# Patient Record
Sex: Male | Born: 1955
Health system: Southern US, Community
[De-identification: ages and names within clinical notes are randomized; demographics above are authoritative.]

## PROBLEM LIST (undated history)

## (undated) DIAGNOSIS — R4182 Altered mental status, unspecified: Secondary | ICD-10-CM

## (undated) DIAGNOSIS — J441 Chronic obstructive pulmonary disease with (acute) exacerbation: Secondary | ICD-10-CM

## (undated) DIAGNOSIS — E876 Hypokalemia: Secondary | ICD-10-CM

## (undated) DIAGNOSIS — M1990 Unspecified osteoarthritis, unspecified site: Secondary | ICD-10-CM

## (undated) DIAGNOSIS — Z720 Tobacco use: Secondary | ICD-10-CM

## (undated) DIAGNOSIS — K219 Gastro-esophageal reflux disease without esophagitis: Secondary | ICD-10-CM

## (undated) DIAGNOSIS — I959 Hypotension, unspecified: Secondary | ICD-10-CM

## (undated) DIAGNOSIS — I469 Cardiac arrest, cause unspecified: Secondary | ICD-10-CM

## (undated) DIAGNOSIS — R7303 Prediabetes: Secondary | ICD-10-CM

## (undated) DIAGNOSIS — I2510 Atherosclerotic heart disease of native coronary artery without angina pectoris: Secondary | ICD-10-CM

## (undated) DIAGNOSIS — J42 Unspecified chronic bronchitis: Secondary | ICD-10-CM

## (undated) DIAGNOSIS — C801 Malignant (primary) neoplasm, unspecified: Secondary | ICD-10-CM

## (undated) DIAGNOSIS — E785 Hyperlipidemia, unspecified: Secondary | ICD-10-CM

## (undated) DIAGNOSIS — I214 Non-ST elevation (NSTEMI) myocardial infarction: Secondary | ICD-10-CM

## (undated) DIAGNOSIS — Z862 Personal history of diseases of the blood and blood-forming organs and certain disorders involving the immune mechanism: Secondary | ICD-10-CM

## (undated) HISTORY — PX: OTHER SURGICAL HISTORY: SHX169

## (undated) HISTORY — PX: NO PAST SURGERIES: SHX2092

---

## 2007-08-15 ENCOUNTER — Emergency Department (HOSPITAL_COMMUNITY): Admission: EM | Admit: 2007-08-15 | Discharge: 2007-08-15 | Payer: Self-pay | Admitting: Emergency Medicine

## 2007-12-18 ENCOUNTER — Emergency Department (HOSPITAL_COMMUNITY): Admission: EM | Admit: 2007-12-18 | Discharge: 2007-12-18 | Payer: Self-pay | Admitting: Emergency Medicine

## 2013-06-12 ENCOUNTER — Inpatient Hospital Stay (HOSPITAL_COMMUNITY): Payer: Medicaid Other

## 2013-06-12 ENCOUNTER — Encounter (HOSPITAL_COMMUNITY): Payer: Self-pay | Admitting: Emergency Medicine

## 2013-06-12 ENCOUNTER — Emergency Department (HOSPITAL_COMMUNITY): Payer: Medicaid Other

## 2013-06-12 ENCOUNTER — Inpatient Hospital Stay (HOSPITAL_COMMUNITY)
Admission: EM | Admit: 2013-06-12 | Discharge: 2013-06-19 | DRG: 246 | Disposition: A | Discharge status: Home / Self Care | Payer: Medicaid Other | Attending: Cardiovascular Disease | Admitting: Cardiovascular Disease

## 2013-06-12 ENCOUNTER — Encounter (HOSPITAL_COMMUNITY)
Admission: EM | Disposition: A | Discharge status: Home / Self Care | Payer: Self-pay | Source: Home / Self Care | Attending: Cardiovascular Disease

## 2013-06-12 DIAGNOSIS — I4729 Other ventricular tachycardia: Secondary | ICD-10-CM | POA: Diagnosis present

## 2013-06-12 DIAGNOSIS — I2510 Atherosclerotic heart disease of native coronary artery without angina pectoris: Secondary | ICD-10-CM | POA: Diagnosis present

## 2013-06-12 DIAGNOSIS — I472 Ventricular tachycardia, unspecified: Secondary | ICD-10-CM | POA: Diagnosis present

## 2013-06-12 DIAGNOSIS — E876 Hypokalemia: Secondary | ICD-10-CM | POA: Diagnosis present

## 2013-06-12 DIAGNOSIS — E785 Hyperlipidemia, unspecified: Secondary | ICD-10-CM | POA: Diagnosis present

## 2013-06-12 DIAGNOSIS — I214 Non-ST elevation (NSTEMI) myocardial infarction: Secondary | ICD-10-CM | POA: Diagnosis present

## 2013-06-12 DIAGNOSIS — Z23 Encounter for immunization: Secondary | ICD-10-CM | POA: Diagnosis not present

## 2013-06-12 DIAGNOSIS — Z87891 Personal history of nicotine dependence: Secondary | ICD-10-CM | POA: Diagnosis present

## 2013-06-12 DIAGNOSIS — I469 Cardiac arrest, cause unspecified: Secondary | ICD-10-CM | POA: Diagnosis present

## 2013-06-12 DIAGNOSIS — I2582 Chronic total occlusion of coronary artery: Secondary | ICD-10-CM | POA: Diagnosis present

## 2013-06-12 DIAGNOSIS — T380X5A Adverse effect of glucocorticoids and synthetic analogues, initial encounter: Secondary | ICD-10-CM | POA: Diagnosis present

## 2013-06-12 DIAGNOSIS — I4901 Ventricular fibrillation: Secondary | ICD-10-CM | POA: Diagnosis present

## 2013-06-12 DIAGNOSIS — G931 Anoxic brain damage, not elsewhere classified: Secondary | ICD-10-CM

## 2013-06-12 DIAGNOSIS — J9600 Acute respiratory failure, unspecified whether with hypoxia or hypercapnia: Secondary | ICD-10-CM | POA: Diagnosis present

## 2013-06-12 DIAGNOSIS — I498 Other specified cardiac arrhythmias: Secondary | ICD-10-CM | POA: Diagnosis present

## 2013-06-12 DIAGNOSIS — Z8674 Personal history of sudden cardiac arrest: Secondary | ICD-10-CM

## 2013-06-12 DIAGNOSIS — J441 Chronic obstructive pulmonary disease with (acute) exacerbation: Secondary | ICD-10-CM | POA: Diagnosis present

## 2013-06-12 DIAGNOSIS — Z9861 Coronary angioplasty status: Secondary | ICD-10-CM

## 2013-06-12 DIAGNOSIS — I959 Hypotension, unspecified: Secondary | ICD-10-CM | POA: Diagnosis not present

## 2013-06-12 DIAGNOSIS — R000 Tachycardia, unspecified: Secondary | ICD-10-CM

## 2013-06-12 DIAGNOSIS — J9601 Acute respiratory failure with hypoxia: Secondary | ICD-10-CM

## 2013-06-12 DIAGNOSIS — R4182 Altered mental status, unspecified: Secondary | ICD-10-CM | POA: Diagnosis present

## 2013-06-12 DIAGNOSIS — Z720 Tobacco use: Secondary | ICD-10-CM | POA: Diagnosis present

## 2013-06-12 DIAGNOSIS — J42 Unspecified chronic bronchitis: Secondary | ICD-10-CM | POA: Diagnosis present

## 2013-06-12 DIAGNOSIS — J449 Chronic obstructive pulmonary disease, unspecified: Secondary | ICD-10-CM | POA: Diagnosis present

## 2013-06-12 HISTORY — DX: Chronic obstructive pulmonary disease with (acute) exacerbation: J44.1

## 2013-06-12 HISTORY — DX: Hypotension, unspecified: I95.9

## 2013-06-12 HISTORY — PX: LEFT HEART CATHETERIZATION WITH CORONARY ANGIOGRAM: SHX5451

## 2013-06-12 HISTORY — DX: Cardiac arrest, cause unspecified: I46.9

## 2013-06-12 HISTORY — DX: Unspecified chronic bronchitis: J42

## 2013-06-12 HISTORY — DX: Tobacco use: Z72.0

## 2013-06-12 HISTORY — PX: CORONARY ANGIOPLASTY WITH STENT PLACEMENT: SHX49

## 2013-06-12 HISTORY — DX: Hypomagnesemia: E83.42

## 2013-06-12 HISTORY — DX: Altered mental status, unspecified: R41.82

## 2013-06-12 HISTORY — DX: Hyperlipidemia, unspecified: E78.5

## 2013-06-12 HISTORY — DX: Non-ST elevation (NSTEMI) myocardial infarction: I21.4

## 2013-06-12 HISTORY — DX: Hypokalemia: E87.6

## 2013-06-12 LAB — TROPONIN I
TROPONIN I: 5.76 ng/mL — AB (ref <0.30)
Troponin I: 4.42 ng/mL (ref <0.30)

## 2013-06-12 LAB — COMPREHENSIVE METABOLIC PANEL
ALK PHOS: 90 U/L (ref 39–117)
ALT: 38 U/L (ref 0–53)
ALT: 34 U/L (ref 0–53)
AST: 59 U/L — ABNORMAL HIGH (ref 0–37)
AST: 53 U/L — ABNORMAL HIGH (ref 0–37)
Albumin: 3.2 g/dL — ABNORMAL LOW (ref 3.5–5.2)
Albumin: 3.2 g/dL — ABNORMAL LOW (ref 3.5–5.2)
Alkaline Phosphatase: 89 U/L (ref 39–117)
BILIRUBIN TOTAL: 0.4 mg/dL (ref 0.3–1.2)
BUN: 16 mg/dL (ref 6–23)
BUN: 14 mg/dL (ref 6–23)
CHLORIDE: 103 meq/L (ref 96–112)
CO2: 18 meq/L — AB (ref 19–32)
CO2: 20 meq/L (ref 19–32)
CREATININE: 0.94 mg/dL (ref 0.50–1.35)
Calcium: 8.3 mg/dL — ABNORMAL LOW (ref 8.4–10.5)
Calcium: 7.5 mg/dL — ABNORMAL LOW (ref 8.4–10.5)
Chloride: 103 mEq/L (ref 96–112)
Creatinine, Ser: 0.71 mg/dL (ref 0.50–1.35)
GFR calc Af Amer: >90 mL/min (ref >90)
GFR calc non Af Amer: >90 mL/min (ref >90)
GLUCOSE: 190 mg/dL — AB (ref 70–99)
Glucose, Bld: 241 mg/dL — ABNORMAL HIGH (ref 70–99)
POTASSIUM: 4.3 meq/L (ref 3.7–5.3)
Potassium: 4.4 mEq/L (ref 3.7–5.3)
SODIUM: 138 meq/L (ref 137–147)
Sodium: 136 mEq/L — ABNORMAL LOW (ref 137–147)
TOTAL PROTEIN: 6.8 g/dL (ref 6.0–8.3)
Total Bilirubin: 0.3 mg/dL (ref 0.3–1.2)
Total Protein: 6.6 g/dL (ref 6.0–8.3)

## 2013-06-12 LAB — CBC
HEMATOCRIT: 43.4 % (ref 39.0–52.0)
HEMOGLOBIN: 15 g/dL (ref 13.0–17.0)
MCH: 31.2 pg (ref 26.0–34.0)
MCHC: 34.6 g/dL (ref 30.0–36.0)
MCV: 90.2 fL (ref 78.0–100.0)
Platelets: 289 10*3/uL (ref 150–400)
RBC: 4.81 MIL/uL (ref 4.22–5.81)
RDW: 13.2 % (ref 11.5–15.5)
WBC: 19.6 10*3/uL — ABNORMAL HIGH (ref 4.0–10.5)

## 2013-06-12 LAB — POCT I-STAT 3, ART BLOOD GAS (G3+)
ACID-BASE DEFICIT: 3 mmol/L — AB (ref 0.0–2.0)
Bicarbonate: 24.7 mEq/L — ABNORMAL HIGH (ref 20.0–24.0)
O2 SAT: 100 %
PCO2 ART: 51.3 mmHg — AB (ref 35.0–45.0)
PO2 ART: 259 mmHg — AB (ref 80.0–100.0)
Patient temperature: 98.6
TCO2: 26 mmol/L (ref 0–100)
pH, Arterial: 7.291 — ABNORMAL LOW (ref 7.350–7.450)

## 2013-06-12 LAB — CBC WITH DIFFERENTIAL/PLATELET
BASOS ABS: 0 10*3/uL (ref 0.0–0.1)
Basophils Relative: 0 % (ref 0–1)
EOS ABS: 0 10*3/uL (ref 0.0–0.7)
Eosinophils Relative: 0 % (ref 0–5)
HCT: 42.3 % (ref 39.0–52.0)
Hemoglobin: 14.5 g/dL (ref 13.0–17.0)
LYMPHS ABS: 1.3 10*3/uL (ref 0.7–4.0)
LYMPHS PCT: 7 % — AB (ref 12–46)
MCH: 30.7 pg (ref 26.0–34.0)
MCHC: 34.3 g/dL (ref 30.0–36.0)
MCV: 89.6 fL (ref 78.0–100.0)
MONOS PCT: 15 % — AB (ref 3–12)
Monocytes Absolute: 2.7 10*3/uL — ABNORMAL HIGH (ref 0.1–1.0)
Neutro Abs: 14 10*3/uL — ABNORMAL HIGH (ref 1.7–7.7)
Neutrophils Relative %: 78 % — ABNORMAL HIGH (ref 43–77)
PLATELETS: 267 10*3/uL (ref 150–400)
RBC: 4.72 MIL/uL (ref 4.22–5.81)
RDW: 13 % (ref 11.5–15.5)
WBC: 18 10*3/uL — ABNORMAL HIGH (ref 4.0–10.5)

## 2013-06-12 LAB — POCT I-STAT, CHEM 8
BUN: 22 mg/dL (ref 6–23)
BUN: 13 mg/dL (ref 6–23)
BUN: 10 mg/dL (ref 6–23)
CALCIUM ION: 1.08 mmol/L — AB (ref 1.12–1.23)
CALCIUM ION: 1.08 mmol/L — AB (ref 1.12–1.23)
CHLORIDE: 104 meq/L (ref 96–112)
CHLORIDE: 105 meq/L (ref 96–112)
CREATININE: 0.6 mg/dL (ref 0.50–1.35)
CREATININE: 0.5 mg/dL (ref 0.50–1.35)
Calcium, Ion: 1.1 mmol/L — ABNORMAL LOW (ref 1.12–1.23)
Chloride: 102 mEq/L (ref 96–112)
Creatinine, Ser: 1.1 mg/dL (ref 0.50–1.35)
GLUCOSE: 232 mg/dL — AB (ref 70–99)
GLUCOSE: 182 mg/dL — AB (ref 70–99)
Glucose, Bld: 190 mg/dL — ABNORMAL HIGH (ref 70–99)
HCT: 45 % (ref 39.0–52.0)
HEMATOCRIT: 47 % (ref 39.0–52.0)
HEMATOCRIT: 46 % (ref 39.0–52.0)
Hemoglobin: 16 g/dL (ref 13.0–17.0)
Hemoglobin: 15.6 g/dL (ref 13.0–17.0)
Hemoglobin: 15.3 g/dL (ref 13.0–17.0)
POTASSIUM: 5.1 meq/L (ref 3.7–5.3)
Potassium: 3.5 mEq/L — ABNORMAL LOW (ref 3.7–5.3)
Potassium: 3.1 mEq/L — ABNORMAL LOW (ref 3.7–5.3)
Sodium: 138 mEq/L (ref 137–147)
Sodium: 141 mEq/L (ref 137–147)
Sodium: 142 mEq/L (ref 137–147)
TCO2: 24 mmol/L (ref 0–100)
TCO2: 19 mmol/L (ref 0–100)
TCO2: 19 mmol/L (ref 0–100)

## 2013-06-12 LAB — APTT
APTT: 93 s — AB (ref 24–37)
APTT: 43 s — AB (ref 24–37)

## 2013-06-12 LAB — PHOSPHORUS: PHOSPHORUS: 3.6 mg/dL (ref 2.3–4.6)

## 2013-06-12 LAB — BASIC METABOLIC PANEL
BUN: 11 mg/dL (ref 6–23)
BUN: 9 mg/dL (ref 6–23)
BUN: 9 mg/dL (ref 6–23)
CHLORIDE: 105 meq/L (ref 96–112)
CHLORIDE: 103 meq/L (ref 96–112)
CO2: 21 mEq/L (ref 19–32)
CO2: 22 mEq/L (ref 19–32)
CO2: 22 meq/L (ref 19–32)
Calcium: 7.7 mg/dL — ABNORMAL LOW (ref 8.4–10.5)
Calcium: 7.3 mg/dL — ABNORMAL LOW (ref 8.4–10.5)
Calcium: 8 mg/dL — ABNORMAL LOW (ref 8.4–10.5)
Chloride: 103 mEq/L (ref 96–112)
Creatinine, Ser: 0.52 mg/dL (ref 0.50–1.35)
Creatinine, Ser: 0.48 mg/dL — ABNORMAL LOW (ref 0.50–1.35)
Creatinine, Ser: 0.48 mg/dL — ABNORMAL LOW (ref 0.50–1.35)
GFR calc Af Amer: >90 mL/min (ref >90)
GFR calc Af Amer: >90 mL/min (ref >90)
GFR calc non Af Amer: >90 mL/min (ref >90)
GFR calc non Af Amer: >90 mL/min (ref >90)
GLUCOSE: 212 mg/dL — AB (ref 70–99)
Glucose, Bld: 102 mg/dL — ABNORMAL HIGH (ref 70–99)
Glucose, Bld: 136 mg/dL — ABNORMAL HIGH (ref 70–99)
POTASSIUM: 3.9 meq/L (ref 3.7–5.3)
POTASSIUM: 3.7 meq/L (ref 3.7–5.3)
POTASSIUM: 3.7 meq/L (ref 3.7–5.3)
SODIUM: 137 meq/L (ref 137–147)
SODIUM: 138 meq/L (ref 137–147)
Sodium: 139 mEq/L (ref 137–147)

## 2013-06-12 LAB — PROTIME-INR
INR: 1.84 — ABNORMAL HIGH (ref 0.00–1.49)
INR: 1.32 (ref 0.00–1.49)
PROTHROMBIN TIME: 20.7 s — AB (ref 11.6–15.2)
Prothrombin Time: 16.1 seconds — ABNORMAL HIGH (ref 11.6–15.2)

## 2013-06-12 LAB — GLUCOSE, CAPILLARY
GLUCOSE-CAPILLARY: 189 mg/dL — AB (ref 70–99)
GLUCOSE-CAPILLARY: 204 mg/dL — AB (ref 70–99)
GLUCOSE-CAPILLARY: 198 mg/dL — AB (ref 70–99)
GLUCOSE-CAPILLARY: 99 mg/dL (ref 70–99)
GLUCOSE-CAPILLARY: 102 mg/dL — AB (ref 70–99)
Glucose-Capillary: 207 mg/dL — ABNORMAL HIGH (ref 70–99)
Glucose-Capillary: 209 mg/dL — ABNORMAL HIGH (ref 70–99)
Glucose-Capillary: 185 mg/dL — ABNORMAL HIGH (ref 70–99)
Glucose-Capillary: 193 mg/dL — ABNORMAL HIGH (ref 70–99)
Glucose-Capillary: 131 mg/dL — ABNORMAL HIGH (ref 70–99)
Glucose-Capillary: 97 mg/dL (ref 70–99)
Glucose-Capillary: 116 mg/dL — ABNORMAL HIGH (ref 70–99)
Glucose-Capillary: 47 mg/dL — ABNORMAL LOW (ref 70–99)
Glucose-Capillary: 109 mg/dL — ABNORMAL HIGH (ref 70–99)

## 2013-06-12 LAB — BLOOD GAS, ARTERIAL
Acid-base deficit: 4.6 mmol/L — ABNORMAL HIGH (ref 0.0–2.0)
Bicarbonate: 20.4 mEq/L (ref 20.0–24.0)
Drawn by: 28701
FIO2: 0.6 %
O2 Saturation: 99.4 %
PEEP/CPAP: 8 cmH2O
PH ART: 7.319 — AB (ref 7.350–7.450)
PO2 ART: 249 mmHg — AB (ref 80.0–100.0)
Patient temperature: 98.6
RATE: 20 resp/min
TCO2: 21.7 mmol/L (ref 0–100)
pCO2 arterial: 41 mmHg (ref 35.0–45.0)

## 2013-06-12 LAB — URINE MICROSCOPIC-ADD ON

## 2013-06-12 LAB — URINALYSIS, ROUTINE W REFLEX MICROSCOPIC
Bilirubin Urine: NEGATIVE
Glucose, UA: 500 mg/dL — AB
KETONES UR: 15 mg/dL — AB
NITRITE: NEGATIVE
Protein, ur: NEGATIVE mg/dL
SPECIFIC GRAVITY, URINE: 1.03 (ref 1.005–1.030)
Urobilinogen, UA: 0.2 mg/dL (ref 0.0–1.0)
pH: 7 (ref 5.0–8.0)

## 2013-06-12 LAB — RAPID URINE DRUG SCREEN, HOSP PERFORMED
Amphetamines: NOT DETECTED
Barbiturates: NOT DETECTED
Benzodiazepines: POSITIVE — AB
Cocaine: NOT DETECTED
Opiates: NOT DETECTED
Tetrahydrocannabinol: POSITIVE — AB

## 2013-06-12 LAB — POCT ACTIVATED CLOTTING TIME: Activated Clotting Time: 520 seconds

## 2013-06-12 LAB — HEMOGLOBIN A1C
HEMOGLOBIN A1C: 5.9 % — AB (ref <5.7)
Mean Plasma Glucose: 123 mg/dL — ABNORMAL HIGH (ref <117)

## 2013-06-12 LAB — LIPID PANEL
CHOL/HDL RATIO: 3.7 ratio
Cholesterol: 132 mg/dL (ref 0–200)
HDL: 36 mg/dL — AB (ref >39)
LDL CALC: 77 mg/dL (ref 0–99)
Triglycerides: 94 mg/dL (ref <150)
VLDL: 19 mg/dL (ref 0–40)

## 2013-06-12 LAB — MAGNESIUM
Magnesium: 2.2 mg/dL (ref 1.5–2.5)
Magnesium: 1.8 mg/dL (ref 1.5–2.5)
Magnesium: 2 mg/dL (ref 1.5–2.5)
Magnesium: 1.7 mg/dL (ref 1.5–2.5)

## 2013-06-12 LAB — CG4 I-STAT (LACTIC ACID): LACTIC ACID, VENOUS: 3.35 mmol/L — AB (ref 0.5–2.2)

## 2013-06-12 LAB — POCT I-STAT TROPONIN I: Troponin i, poc: 0.32 ng/mL (ref 0.00–0.08)

## 2013-06-12 SURGERY — LEFT HEART CATHETERIZATION WITH CORONARY ANGIOGRAM
Anesthesia: LOCAL

## 2013-06-12 MED ORDER — SODIUM CHLORIDE 0.9 % IV SOLN
INTRAVENOUS | Status: DC
  Administered 2013-06-12 – 2013-06-14 (×2): via INTRAVENOUS

## 2013-06-12 MED ORDER — SODIUM CHLORIDE 0.9 % IV SOLN
INTRAVENOUS | Status: DC
  Administered 2013-06-12: 1000 mL via INTRAVENOUS

## 2013-06-12 MED ORDER — SODIUM CHLORIDE 0.9 % IV SOLN
1.0000 ug/kg/min | INTRAVENOUS | Status: DC
  Administered 2013-06-12: 1 ug/kg/min via INTRAVENOUS
  Filled 2013-06-12 (×2): qty 20

## 2013-06-12 MED ORDER — SUCCINYLCHOLINE CHLORIDE 20 MG/ML IJ SOLN
120.0000 mg | Freq: Once | INTRAMUSCULAR | Status: AC
  Administered 2013-06-12: 120 mg via INTRAVENOUS

## 2013-06-12 MED ORDER — ENOXAPARIN SODIUM 40 MG/0.4ML ~~LOC~~ SOLN
40.0000 mg | SUBCUTANEOUS | Status: DC
  Administered 2013-06-12 – 2013-06-18 (×7): 40 mg via SUBCUTANEOUS
  Filled 2013-06-12 (×8): qty 0.4

## 2013-06-12 MED ORDER — ETOMIDATE 2 MG/ML IV SOLN
20.0000 mg | Freq: Once | INTRAVENOUS | Status: AC
  Administered 2013-06-12: 20 mg via INTRAVENOUS

## 2013-06-12 MED ORDER — CISATRACURIUM BOLUS VIA INFUSION
0.1000 mg/kg | Freq: Once | INTRAVENOUS | Status: AC
  Administered 2013-06-12: 7.7 mg via INTRAVENOUS
  Filled 2013-06-12: qty 8

## 2013-06-12 MED ORDER — BIVALIRUDIN 250 MG IV SOLR
INTRAVENOUS | Status: AC
  Filled 2013-06-12: qty 250

## 2013-06-12 MED ORDER — TICAGRELOR 90 MG PO TABS
ORAL_TABLET | ORAL | Status: AC
  Filled 2013-06-12: qty 2

## 2013-06-12 MED ORDER — SODIUM CHLORIDE 0.9 % IV SOLN
1.0000 mg/h | INTRAVENOUS | Status: DC
  Administered 2013-06-12 – 2013-06-13 (×3): 2 mg/h via INTRAVENOUS
  Administered 2013-06-13: 5 mg/h via INTRAVENOUS
  Filled 2013-06-12 (×5): qty 10

## 2013-06-12 MED ORDER — NOREPINEPHRINE BITARTRATE 1 MG/ML IJ SOLN
0.5000 ug/min | INTRAVENOUS | Status: DC
  Administered 2013-06-13: 3 ug/min via INTRAVENOUS
  Administered 2013-06-13: 5 ug/min via INTRAVENOUS
  Filled 2013-06-12 (×4): qty 4

## 2013-06-12 MED ORDER — ASPIRIN 300 MG RE SUPP
300.0000 mg | RECTAL | Status: AC
  Filled 2013-06-12: qty 1

## 2013-06-12 MED ORDER — NITROGLYCERIN 0.2 MG/ML ON CALL CATH LAB
INTRAVENOUS | Status: AC
  Filled 2013-06-12: qty 1

## 2013-06-12 MED ORDER — INFLUENZA VAC SPLIT QUAD 0.5 ML IM SUSP
0.5000 mL | INTRAMUSCULAR | Status: DC
  Filled 2013-06-12: qty 0.5

## 2013-06-12 MED ORDER — PNEUMOCOCCAL VAC POLYVALENT 25 MCG/0.5ML IJ INJ
0.5000 mL | INJECTION | INTRAMUSCULAR | Status: DC
  Filled 2013-06-12: qty 0.5

## 2013-06-12 MED ORDER — METOPROLOL TARTRATE 25 MG/10 ML ORAL SUSPENSION
25.0000 mg | Freq: Two times a day (BID) | ORAL | Status: DC
  Administered 2013-06-13: 25 mg via ORAL
  Filled 2013-06-12 (×4): qty 10

## 2013-06-12 MED ORDER — BUDESONIDE 0.25 MG/2ML IN SUSP
0.2500 mg | Freq: Two times a day (BID) | RESPIRATORY_TRACT | Status: DC
  Administered 2013-06-12 – 2013-06-15 (×7): 0.25 mg via RESPIRATORY_TRACT
  Filled 2013-06-12 (×9): qty 2

## 2013-06-12 MED ORDER — POTASSIUM CHLORIDE 10 MEQ/50ML IV SOLN
INTRAVENOUS | Status: AC
  Administered 2013-06-12: 10 meq via INTRAVENOUS
  Filled 2013-06-12: qty 200

## 2013-06-12 MED ORDER — POTASSIUM CHLORIDE 10 MEQ/50ML IV SOLN
10.0000 meq | INTRAVENOUS | Status: AC
  Administered 2013-06-12 (×4): 10 meq via INTRAVENOUS

## 2013-06-12 MED ORDER — DOXYCYCLINE HYCLATE 100 MG IV SOLR
100.0000 mg | Freq: Two times a day (BID) | INTRAVENOUS | Status: DC
  Administered 2013-06-12 – 2013-06-14 (×6): 100 mg via INTRAVENOUS
  Filled 2013-06-12 (×8): qty 100

## 2013-06-12 MED ORDER — NITROGLYCERIN IN D5W 200-5 MCG/ML-% IV SOLN: 2.0000 ug/min | INTRAVENOUS | Status: DC

## 2013-06-12 MED ORDER — ONDANSETRON HCL 4 MG/2ML IJ SOLN
4.0000 mg | Freq: Four times a day (QID) | INTRAMUSCULAR | Status: DC | PRN
  Administered 2013-06-14 (×2): 4 mg via INTRAVENOUS
  Filled 2013-06-12 (×2): qty 2

## 2013-06-12 MED ORDER — ATORVASTATIN CALCIUM 80 MG PO TABS
80.0000 mg | ORAL_TABLET | Freq: Every day | ORAL | Status: DC
  Administered 2013-06-12 – 2013-06-19 (×8): 80 mg via ORAL
  Filled 2013-06-12 (×8): qty 1

## 2013-06-12 MED ORDER — CHLORHEXIDINE GLUCONATE 0.12 % MT SOLN
15.0000 mL | Freq: Two times a day (BID) | OROMUCOSAL | Status: DC
  Administered 2013-06-12 – 2013-06-14 (×5): 15 mL via OROMUCOSAL
  Filled 2013-06-12 (×5): qty 15

## 2013-06-12 MED ORDER — DEXMEDETOMIDINE HCL IN NACL 200 MCG/50ML IV SOLN
0.4000 ug/kg/h | INTRAVENOUS | Status: DC
  Filled 2013-06-12: qty 50

## 2013-06-12 MED ORDER — PANTOPRAZOLE SODIUM 40 MG IV SOLR
40.0000 mg | Freq: Every day | INTRAVENOUS | Status: DC
  Administered 2013-06-13 – 2013-06-14 (×2): 40 mg via INTRAVENOUS
  Filled 2013-06-12 (×3): qty 40

## 2013-06-12 MED ORDER — SODIUM CHLORIDE 0.9 % IV SOLN
20.0000 ug/h | INTRAVENOUS | Status: DC
  Administered 2013-06-12: 20 ug/h via INTRAVENOUS
  Administered 2013-06-13: 100 ug/h via INTRAVENOUS
  Administered 2013-06-13: 20 ug/h via INTRAVENOUS
  Filled 2013-06-12 (×4): qty 50

## 2013-06-12 MED ORDER — LIDOCAINE HCL (PF) 1 % IJ SOLN
INTRAMUSCULAR | Status: AC
  Filled 2013-06-12: qty 30

## 2013-06-12 MED ORDER — ASPIRIN EC 81 MG PO TBEC
81.0000 mg | DELAYED_RELEASE_TABLET | Freq: Every day | ORAL | Status: DC
  Filled 2013-06-12: qty 1

## 2013-06-12 MED ORDER — IPRATROPIUM-ALBUTEROL 0.5-2.5 (3) MG/3ML IN SOLN
3.0000 mL | RESPIRATORY_TRACT | Status: DC
  Administered 2013-06-12 – 2013-06-14 (×14): 3 mL via RESPIRATORY_TRACT
  Filled 2013-06-12 (×12): qty 3

## 2013-06-12 MED ORDER — INSULIN ASPART 100 UNIT/ML ~~LOC~~ SOLN
2.0000 [IU] | SUBCUTANEOUS | Status: DC
  Administered 2013-06-12: 4 [IU] via SUBCUTANEOUS
  Administered 2013-06-12: 2 [IU] via SUBCUTANEOUS
  Administered 2013-06-13: 4 [IU] via SUBCUTANEOUS
  Administered 2013-06-13 (×2): 2 [IU] via SUBCUTANEOUS
  Administered 2013-06-14: 4 [IU] via SUBCUTANEOUS

## 2013-06-12 MED ORDER — ALBUTEROL SULFATE (2.5 MG/3ML) 0.083% IN NEBU: 2.5000 mg | INHALATION_SOLUTION | RESPIRATORY_TRACT | Status: DC | PRN

## 2013-06-12 MED ORDER — SODIUM CHLORIDE 0.9 % IV SOLN
INTRAVENOUS | Status: DC
  Administered 2013-06-12 – 2013-06-14 (×2): via INTRAVENOUS

## 2013-06-12 MED ORDER — SODIUM CHLORIDE 0.9 % IV SOLN: 250.0000 mL | INTRAVENOUS | Status: DC | PRN

## 2013-06-12 MED ORDER — BIOTENE DRY MOUTH MT LIQD
15.0000 mL | Freq: Four times a day (QID) | OROMUCOSAL | Status: DC
  Administered 2013-06-12 – 2013-06-14 (×8): 15 mL via OROMUCOSAL

## 2013-06-12 MED ORDER — METOPROLOL TARTRATE 1 MG/ML IV SOLN
INTRAVENOUS | Status: AC
  Filled 2013-06-12: qty 5

## 2013-06-12 MED ORDER — TICAGRELOR 90 MG PO TABS
90.0000 mg | ORAL_TABLET | Freq: Two times a day (BID) | ORAL | Status: DC
  Administered 2013-06-12 – 2013-06-19 (×14): 90 mg via ORAL
  Filled 2013-06-12 (×15): qty 1

## 2013-06-12 MED ORDER — ACETAMINOPHEN 325 MG PO TABS
650.0000 mg | ORAL_TABLET | ORAL | Status: DC | PRN
  Administered 2013-06-15 (×3): 650 mg via ORAL
  Filled 2013-06-12 (×3): qty 2

## 2013-06-12 MED ORDER — ARTIFICIAL TEARS OP OINT
1.0000 "application " | TOPICAL_OINTMENT | Freq: Three times a day (TID) | OPHTHALMIC | Status: DC
  Administered 2013-06-12 – 2013-06-13 (×4): 1 via OPHTHALMIC
  Filled 2013-06-12: qty 3.5

## 2013-06-12 MED ORDER — ASPIRIN 81 MG PO CHEW
81.0000 mg | CHEWABLE_TABLET | Freq: Every day | ORAL | Status: DC
  Administered 2013-06-12 – 2013-06-19 (×5): 81 mg via ORAL
  Filled 2013-06-12 (×7): qty 1

## 2013-06-12 MED ORDER — ASPIRIN 325 MG PO TABS
ORAL_TABLET | ORAL | Status: AC
  Filled 2013-06-12: qty 1

## 2013-06-12 MED ORDER — SODIUM CHLORIDE 0.9 % IV SOLN
2000.0000 mL | Freq: Once | INTRAVENOUS | Status: AC
  Administered 2013-06-12: 2000 mL via INTRAVENOUS

## 2013-06-12 MED ORDER — SODIUM CHLORIDE 0.9 % IV SOLN
INTRAVENOUS | Status: DC
  Administered 2013-06-12: 1.3 [IU]/h via INTRAVENOUS
  Filled 2013-06-12: qty 1

## 2013-06-12 MED ORDER — HEPARIN (PORCINE) IN NACL 2-0.9 UNIT/ML-% IJ SOLN
INTRAMUSCULAR | Status: AC
  Filled 2013-06-12: qty 1000

## 2013-06-12 MED ORDER — METHYLPREDNISOLONE SODIUM SUCC 125 MG IJ SOLR: 125.0000 mg | Freq: Once | INTRAMUSCULAR | Status: DC

## 2013-06-12 MED ORDER — TICAGRELOR 90 MG PO TABS: 90.0000 mg | ORAL_TABLET | Freq: Two times a day (BID) | ORAL | Status: DC

## 2013-06-12 MED ORDER — CISATRACURIUM BOLUS VIA INFUSION
0.0500 mg/kg | INTRAVENOUS | Status: DC | PRN
  Filled 2013-06-12: qty 4

## 2013-06-12 MED ORDER — METHYLPREDNISOLONE SODIUM SUCC 40 MG IJ SOLR
40.0000 mg | Freq: Two times a day (BID) | INTRAMUSCULAR | Status: DC
  Administered 2013-06-12 – 2013-06-14 (×5): 40 mg via INTRAVENOUS
  Filled 2013-06-12 (×7): qty 1

## 2013-06-12 MED ORDER — ONDANSETRON HCL 4 MG/2ML IJ SOLN
4.0000 mg | Freq: Once | INTRAMUSCULAR | Status: AC
  Administered 2013-06-12: 4 mg via INTRAVENOUS

## 2013-06-12 MED ORDER — NITROGLYCERIN IN D5W 200-5 MCG/ML-% IV SOLN
INTRAVENOUS | Status: AC
  Filled 2013-06-12: qty 250

## 2013-06-12 MED ORDER — ASPIRIN 81 MG PO CHEW: 81.0000 mg | CHEWABLE_TABLET | Freq: Every day | ORAL | Status: DC

## 2013-06-12 MED ORDER — METHYLPREDNISOLONE SODIUM SUCC 40 MG IJ SOLR
40.0000 mg | Freq: Three times a day (TID) | INTRAMUSCULAR | Status: DC
  Filled 2013-06-12 (×3): qty 1

## 2013-06-12 MED ORDER — ASPIRIN 81 MG PO CHEW
CHEWABLE_TABLET | ORAL | Status: AC
  Administered 2013-06-12: 81 mg via ORAL
  Filled 2013-06-12: qty 4

## 2013-06-12 MED ORDER — INSULIN ASPART 100 UNIT/ML ~~LOC~~ SOLN: 0.0000 [IU] | Freq: Three times a day (TID) | SUBCUTANEOUS | Status: DC

## 2013-06-12 MED ORDER — SODIUM CHLORIDE 0.9 % IV SOLN: 0.2500 mg/kg/h | INTRAVENOUS | Status: DC

## 2013-06-12 NOTE — ED Notes (Addendum)
Per EMS: pt coming from home with cardiac arrest. Family found pt unresponsive in bed. Pt went to bed one hour before being found unresponsive. Upon EMS arrival pt was pulseless and apenic. King airway placed. Pt found in V-fib, shocked at 200, pulses back in sinus tach. EMS started 150 mg amioderone drip and cold saline en route, pt was given 5 mg of versed. Upon arrival to ED pt started vomiting brown emesis. CPR started at [place], pulses back at 0224

## 2013-06-12 NOTE — ED Notes (Signed)
Pt transported to cath lab.  

## 2013-06-12 NOTE — Progress Notes (Signed)
Chaplain visited with pt's friend while pt was in cath lab.  Chaplain offered support through his presence, empathic listening and by escorting pt's friend to ED bay area where ED physician spoke with her.   06/12/13 0900  Clinical Encounter Type  Visited With Family  Visit Type Spiritual support  Spiritual Encounters  Spiritual Needs Emotional    Estelle June, chaplain pager [phone]

## 2013-06-12 NOTE — Progress Notes (Signed)
A central line has been established. Therefore, the IO was removed per hospital protocol.  No bleeding to site gauze dressing in place. Timothy [name]

## 2013-06-12 NOTE — ED Provider Notes (Signed)
CSN: [id number]     Arrival date & time 06/12/13  0304 History   First MD Initiated Contact with Patient 06/12/13 [phone]     Chief Complaint  Patient presents with  . Cardiac Arrest   (Consider location/radiation/quality/duration/timing/severity/associated sxs/prior Treatment) HPI HX per EMS - called out for unresponsive at home, family reported last seen about an hour prior, did not have any complaints. EMS reports pulseless unresponsive on arrival, received 2 min CPR and shocked x 2 VF.   King airway placed, was given amiodarone 150mg  and versed for sedation. BP 140/80, sinus tach, no STEMI called. On arrival to the ER, maintaining pulses and pressure, unresponsive.   Level 5 caveat applies  No past medical history on file. No past surgical history on file. No family history on file. History  Substance Use Topics  . Smoking status: Not on file  . Smokeless tobacco: Not on file  . Alcohol Use: Not on file    Review of Systems  Unable to perform ROS level 5 caveat applies  Allergies  Review of patient's allergies indicates not on file.  Home Medications  No current outpatient prescriptions on file. There were no vitals taken for this visit. Physical Exam  Constitutional:  unresponsive  HENT:  King airway in place, vomitus in airway  Eyes:  Pupils 21mm equal sluggish  Neck: No tracheal deviation present.  Cardiovascular: Intact distal pulses.   Tachycardic   Pulmonary/Chest:  Coarse bilat breath sounds, slow spontaneous respirations, assisting bagging  Abdominal: Soft. He exhibits no distension.  Musculoskeletal:  Equal peripheral pulses, no spontaneous movement  Neurological:  Unresponsive, no gag, no spontaneous movements  Skin: Skin is warm and dry.    ED Course  INTUBATION Date/Time: 06/12/2013 3:24 AM Performed by: Teressa Lower Authorized by: Teressa Lower Consent: The procedure was performed in an emergent situation. Patient identity confirmed: arm band Time  out: Immediately prior to procedure a "time out" was called to verify the correct patient, procedure, equipment, support staff and site/side marked as required. Indications: respiratory failure and airway protection Intubation method: video-assisted Patient status: paralyzed (RSI) Sedatives: etomidate Paralytic: succinylcholine Laryngoscope size: Mac 4 Tube size: 7.5 mm Tube type: cuffed Number of attempts: 1 Cricoid pressure: yes Cords visualized: yes Post-procedure assessment: chest rise and CO2 detector Breath sounds: equal and absent over the epigastrium Cuff inflated: yes ETT to teeth: 23 cm Tube secured with: ETT holder Patient tolerance: Patient tolerated the procedure well with no immediate complications.   (including critical care time) Labs Review Labs Reviewed  CBC - Abnormal; Notable for the following:    WBC 19.6 (*)    All other components within normal limits  GLUCOSE, CAPILLARY - Abnormal; Notable for the following:    Glucose-Capillary 207 (*)    All other components within normal limits  PROTIME-INR - Abnormal; Notable for the following:    Prothrombin Time 20.7 (*)    INR 1.84 (*)    All other components within normal limits  APTT - Abnormal; Notable for the following:    aPTT 93 (*)    All other components within normal limits  COMPREHENSIVE METABOLIC PANEL - Abnormal; Notable for the following:    CO2 18 (*)    Glucose, Bld 190 (*)    Calcium 8.3 (*)    Albumin 3.2 (*)    AST 59 (*)    All other components within normal limits  BLOOD GAS, ARTERIAL - Abnormal; Notable for the following:    pH, Arterial  7.319 (*)    pO2, Arterial 249.0 (*)    Acid-base deficit 4.6 (*)    All other components within normal limits  TROPONIN I - Abnormal; Notable for the following:    Troponin I 5.76 (*)    All other components within normal limits  TROPONIN I - Abnormal; Notable for the following:    Troponin I 4.42 (*)    All other components within normal limits   TROPONIN I - Abnormal; Notable for the following:    Troponin I 2.94 (*)    All other components within normal limits  CBC WITH DIFFERENTIAL - Abnormal; Notable for the following:    WBC 18.0 (*)    Neutrophils Relative % 78 (*)    Lymphocytes Relative 7 (*)    Monocytes Relative 15 (*)    Neutro Abs 14.0 (*)    Monocytes Absolute 2.7 (*)    All other components within normal limits  URINALYSIS, ROUTINE W REFLEX MICROSCOPIC - Abnormal; Notable for the following:    Color, Urine RED (*)    APPearance CLOUDY (*)    Glucose, UA 500 (*)    Hgb urine dipstick LARGE (*)    Ketones, ur 15 (*)    Leukocytes, UA SMALL (*)    All other components within normal limits  LIPID PANEL - Abnormal; Notable for the following:    HDL 36 (*)    All other components within normal limits  HEMOGLOBIN A1C - Abnormal; Notable for the following:    Hemoglobin A1C 5.9 (*)    Mean Plasma Glucose 123 (*)    All other components within normal limits  COMPREHENSIVE METABOLIC PANEL - Abnormal; Notable for the following:    Sodium 136 (*)    Glucose, Bld 241 (*)    Calcium 7.5 (*)    Albumin 3.2 (*)    AST 53 (*)    All other components within normal limits  URINE RAPID DRUG SCREEN (HOSP PERFORMED) - Abnormal; Notable for the following:    Benzodiazepines POSITIVE (*)    Tetrahydrocannabinol POSITIVE (*)    All other components within normal limits  BASIC METABOLIC PANEL - Abnormal; Notable for the following:    Glucose, Bld 212 (*)    Calcium 7.7 (*)    All other components within normal limits  BASIC METABOLIC PANEL - Abnormal; Notable for the following:    Glucose, Bld 102 (*)    Creatinine, Ser 0.48 (*)    Calcium 7.3 (*)    All other components within normal limits  BASIC METABOLIC PANEL - Abnormal; Notable for the following:    Glucose, Bld 136 (*)    Creatinine, Ser 0.48 (*)    Calcium 8.0 (*)    All other components within normal limits  BASIC METABOLIC PANEL - Abnormal; Notable for the  following:    Potassium 3.5 (*)    Glucose, Bld 173 (*)    Creatinine, Ser 0.48 (*)    Calcium 8.0 (*)    All other components within normal limits  APTT - Abnormal; Notable for the following:    aPTT 43 (*)    All other components within normal limits  PROTIME-INR - Abnormal; Notable for the following:    Prothrombin Time 16.1 (*)    All other components within normal limits  BASIC METABOLIC PANEL - Abnormal; Notable for the following:    Potassium 3.2 (*)    Glucose, Bld 153 (*)    Creatinine, Ser 0.49 (*)  Calcium 8.1 (*)    All other components within normal limits  GLUCOSE, CAPILLARY - Abnormal; Notable for the following:    Glucose-Capillary 189 (*)    All other components within normal limits  GLUCOSE, CAPILLARY - Abnormal; Notable for the following:    Glucose-Capillary 204 (*)    All other components within normal limits  GLUCOSE, CAPILLARY - Abnormal; Notable for the following:    Glucose-Capillary 209 (*)    All other components within normal limits  GLUCOSE, CAPILLARY - Abnormal; Notable for the following:    Glucose-Capillary 185 (*)    All other components within normal limits  GLUCOSE, CAPILLARY - Abnormal; Notable for the following:    Glucose-Capillary 198 (*)    All other components within normal limits  GLUCOSE, CAPILLARY - Abnormal; Notable for the following:    Glucose-Capillary 193 (*)    All other components within normal limits  GLUCOSE, CAPILLARY - Abnormal; Notable for the following:    Glucose-Capillary 131 (*)    All other components within normal limits  CBC - Abnormal; Notable for the following:    WBC 15.1 (*)    All other components within normal limits  GLUCOSE, CAPILLARY - Abnormal; Notable for the following:    Glucose-Capillary 47 (*)    All other components within normal limits  GLUCOSE, CAPILLARY - Abnormal; Notable for the following:    Glucose-Capillary 116 (*)    All other components within normal limits  GLUCOSE, CAPILLARY -  Abnormal; Notable for the following:    Glucose-Capillary 102 (*)    All other components within normal limits  GLUCOSE, CAPILLARY - Abnormal; Notable for the following:    Glucose-Capillary 109 (*)    All other components within normal limits  GLUCOSE, CAPILLARY - Abnormal; Notable for the following:    Glucose-Capillary 110 (*)    All other components within normal limits  GLUCOSE, CAPILLARY - Abnormal; Notable for the following:    Glucose-Capillary 122 (*)    All other components within normal limits  GLUCOSE, CAPILLARY - Abnormal; Notable for the following:    Glucose-Capillary 161 (*)    All other components within normal limits  GLUCOSE, CAPILLARY - Abnormal; Notable for the following:    Glucose-Capillary 143 (*)    All other components within normal limits  POCT I-STAT 3, BLOOD GAS (G3+) - Abnormal; Notable for the following:    pH, Arterial 7.291 (*)    pCO2 arterial 51.3 (*)    pO2, Arterial 259.0 (*)    Bicarbonate 24.7 (*)    Acid-base deficit 3.0 (*)    All other components within normal limits  POCT I-STAT, CHEM 8 - Abnormal; Notable for the following:    Glucose, Bld 190 (*)    Calcium, Ion 1.10 (*)    All other components within normal limits  CG4 I-STAT (LACTIC ACID) - Abnormal; Notable for the following:    Lactic Acid, Venous 3.35 (*)    All other components within normal limits  POCT I-STAT TROPONIN I - Abnormal; Notable for the following:    Troponin i, poc 0.32 (*)    All other components within normal limits  POCT I-STAT, CHEM 8 - Abnormal; Notable for the following:    Potassium 3.5 (*)    Glucose, Bld 232 (*)    Calcium, Ion 1.08 (*)    All other components within normal limits  POCT I-STAT, CHEM 8 - Abnormal; Notable for the following:    Potassium 3.1 (*)  Glucose, Bld 182 (*)    Calcium, Ion 1.08 (*)    All other components within normal limits  CULTURE, RESPIRATORY (NON-EXPECTORATED)  URINE CULTURE  MAGNESIUM  PHOSPHORUS  MAGNESIUM   MAGNESIUM  MAGNESIUM  MAGNESIUM  URINE MICROSCOPIC-ADD ON  GLUCOSE, CAPILLARY  MAGNESIUM  GLUCOSE, CAPILLARY  MAGNESIUM  MAGNESIUM  MAGNESIUM  MAGNESIUM  POCT ACTIVATED CLOTTING TIME   Imaging Review Dg Chest Port 1 View  06/13/2013   CLINICAL DATA:  Hypoxia  EXAM: PORTABLE CHEST - 1 VIEW  COMPARISON:  June 12, 2013  FINDINGS: Endotracheal tube tip is 4.8 cm above the carina. Central catheter tip is in the superior vena cava. Nasogastric tube tip and side port are below the diaphragm. No pneumothorax. Patchy interstitial edema remains. There is no new opacity. The heart size and pulmonary vascularity are normal.  IMPRESSION: Tube and catheter positions as described without pneumothorax. Stable interstitial edema. No new opacity.   Electronically Signed   By: Lowella Grip M.D.   On: 06/13/2013 07:05   Dg Chest Port 1 View  06/12/2013   CLINICAL DATA:  Line placement  EXAM: PORTABLE CHEST - 1 VIEW  COMPARISON:  DG CHEST 1V PORT dated 06/12/2013  FINDINGS: There is a right jugular central venous catheter with the tip projecting over the SVC. There is a nasogastric tube coursing below the diaphragm. There is an endotracheal tube in satisfactory position. There is bilateral mild interstitial thickening. There is no pleural effusion, focal parenchymal opacity or pneumothorax. The heart and mediastinal contours are unremarkable.  The osseous structures are unremarkable.  IMPRESSION: Right jugular central venous catheter with the tip projecting over the SVC.   Electronically Signed   By: Kathreen Devoid   On: 06/12/2013 12:07   Dg Chest Portable 1 View  06/12/2013   CLINICAL DATA:  Cardiac arrest, endotracheal tube.  EXAM: PORTABLE CHEST - 1 VIEW  COMPARISON:  None available for comparison at time of study interpretation.  FINDINGS: Cardiomediastinal silhouette is unremarkable. Mild interstitial prominence suggesting chronicity, with bullous change of the right lung apex. No pleural effusions or  focal consolidations. No pneumothorax.  Endotracheal tube tip projects 5.7 cm above the carina. Nasogastric tube side port projects at the gastroesophageal junction, distal tip not imaged.  Multiple EKG lines overlie the patient and may obscure subtle underlying pathology. Soft tissue planes and included osseous structures are nonsuspicious.  IMPRESSION: Endotracheal tube tip projects 5.7 cm above the carina, nasogastric tube, side port projects at gastroesophageal junction.  COPD without acute cardiopulmonary process.   Electronically Signed   By: Elon Alas   On: 06/12/2013 03:55   Ct Portable Head W/o Cm  06/12/2013   CLINICAL DATA:  Cardiac arrest  EXAM: CT HEAD WITHOUT CONTRAST  TECHNIQUE: Contiguous axial images were obtained from the base of the skull through the vertex without intravenous contrast.  COMPARISON:  None available  FINDINGS: There is no acute intracranial hemorrhage or infarct. No mass lesion or midline shift. Gray-white matter differentiation is well maintained. No evidence of significant cerebral edema or anoxia at this time. Ventricles are normal in size without evidence of hydrocephalus. CSF containing spaces are within normal limits. Basilar cisterns are widely patent. No extra-axial fluid collection.  The calvarium is intact.  Orbital soft tissues are within normal limits.  Endotracheal and enteric tubes are partially visualized. Moderate mucoperiosteal thickening present within the maxillary sinuses and ethmoidal air cells bilaterally. No mastoid effusion.  Scalp soft tissues are unremarkable.  IMPRESSION: 1.  No acute intracranial abnormality identified. No evidence of significant cerebral anoxia or edema at this time. 2. Moderate inflammatory maxillary and ethmoidal sinus disease.   Electronically Signed   By: Jeannine Boga M.D.   On: 06/12/2013 12:38    EKG Interpretation    Date/Time:  Monday June 12 2013 03:12:04 EST Ventricular Rate:  130 PR  Interval:  137 QRS Duration: 80 QT Interval:  298 QTC Calculation: 438 R Axis:   80 Text Interpretation:  Sinus tachycardia Nonspecific ST abnormality No old tracing to compare Confirmed by Larz Mark  MD, [place] [phone]) on 06/12/2013 3:18:56 AM           CRITICAL CARE Performed by: Teressa Lower Total critical care time: 45 Critical care time was exclusive of separately billable procedures and treating other patients. Critical care was necessary to treat or prevent imminent or life-threatening deterioration. Critical care was time spent personally by me on the following activities: development of treatment plan with patient and/or surrogate as well as nursing, discussions with consultants, evaluation of patient's response to treatment, examination of patient, obtaining history from patient or surrogate, ordering and performing treatments and interventions, ordering and review of laboratory studies, ordering and review of radiographic studies, pulse oximetry and re-evaluation of patient's condition. ASA PR. Cooling protocol, Sedation, vent management   3:25 AM d/w Dr Claiborne Billings, Cooling protocol, no STEMI on ECG, cath was activated 3:35 AM d/w PCCM - fellow to evaluate bedside MDM  Dx: ROSC post VFib cardiac arrest  Intubated Cooling protocol Cath lab activated ICU Admit    Teressa Lower, MD 06/13/13 [phone]

## 2013-06-12 NOTE — CV Procedure (Signed)
Arval Brandstetter is a 58 y.o. male    [id number]  [id number] LOCATION:  FACILITY: [place]  PHYSICIAN: Troy Sine, MD, [facility] 16-Oct-1955   DATE OF PROCEDURE:  06/12/2013    CARDIAC CATHETERIZATION     HISTORY:  Mr. Edenilson Austad is an 58 year old white male who had a ventricular fibrillation cardiac arrest at home. He was transported to [facility] emergency room. Hypothermia protocol was instituted. He was taken acutely to the cardiac catheterization laboratory to assess for ischemia/infarction mediative VF arrest.   PROCEDURE:  The patient was brought brilinta 180 the second floor [facility] Cardiac cath lab intubated, sedated on Nimbex, Versed, and fentanyl with hypothermia pads in place. His right femoral artery was punctured anteriorly and a 6 French sheath was inserted without difficulty. Hypothermia was instituted. A 5 French diagnostic left catheter was used for selective angiography left coronary artery. A 5 French right catheter was used for selective angiography in the right coronary artery. With the demonstration of total occlusion of the mid right coronary artery after a marginal branch, he was given aspirin 325 mg and brilinta 180 mg via NG tube. Angiomax bolus plus infusion was administered. The patient was significantly hypertensive and was started on intravenous nitroglycerin. A 6 [place] FR4 guide was used and a  [employer] wire was able to cross the total occlusion. Initial dilatation was done with a 2.0x12 mm Trek balloon at 4 and 6 atmospheres. A 2.5x18 mm  Xience Alpine DES stent was then positioned and successfully deployed x2 at 12 and 13 atmospheres. A 2.75x15 mm  Trek was used for post stent dilatation up to 2.75 mm. Scout angiography confirmed an excellent angiographic result. A 5 French pigtail catheter was inserted and RAO left ventriculography was performed using 25 cc of contrast a flow rate of 12 cc per second. Arterial sheath was sutured in place with plan to continued   Angiomax at reduced dose of 4 hours. Because he was tachycardic with heart rate of 110-120 and still hypertensive, he was also treated with metoprolol IV 2.5 mg x2 with improvement in his heart rate to 73 beats per minute. He was transported to the coronary care unit with stable hemodynamics.  HEMODYNAMICS:   Central Aorta: 160/80   Left Ventricle: 160/8/10  ANGIOGRAPHY:  1. Left main: Angiographic a normal vessel which bifurcated into the LAD and LCx.  2. LAD: Angiographic normal vessel which gave rise to a large prominent first diagonal vessel and septal perforating artery and extended to the LV apex. 3. Left circumflex: Angiographically large vessel which gave rise to 2 marginal vessels and in the posterior lateral coronary artery.  4. Right coronary artery: Totally occluded in its midsegment at the takeoff of an anterior marginal branch  Left ventriculography reveals an ejection fraction of 50-55% with mild inferior hypocontractility. There is no evidence for mitral regurgitation.   IMPRESSION:  Acute coronary syndrome presenting as ventricular fibrillation cardiac arrest successfully resuscitated at the patient's home with restoration of sinus rhythm   Acute occlusion of the mid right coronary artery with initial TIMI 0 flow  Normal LAD and left circumflex coronary arteries  Successful acute intervention to the totally occluded RCA with PTCA/stenting with insertion of a 2.5x18 mm Xience Alpine DES stent postdilated to 2.75 mm with 100% occlusion reduced to 0% and with resumption of brisk TIMI 3 flow.  Angiomax/brilinta/aspirin/IC and IV nitroglycerin/IV metoprolol  Troy Sine, MD, [place] 06/12/2013 5:31 AM  and is history of Mr.He

## 2013-06-12 NOTE — H&P (Signed)
Name: Timothy [name] MRN: [id number] DOB: June 15, 1955    ADMISSION DATE:  06/12/2013  REFERRING MD :  ED PRIMARY SERVICE: PCCM  CHIEF COMPLAINT:  Cardiac arrest (VT)  BRIEF PATIENT DESCRIPTION: Patient found unresponsive, pulseless sp CPR x 2 minutes, shock x 3 for VT. Intubated. Hypothermia protocol. Planning for cath lab  SIGNIFICANT EVENTS / STUDIES:  Intubated Hypothermia protocol  LINES / TUBES: ET tube 06/12/13 >>> Foley 06/12/2013 >>>  CULTURES: None  ANTIBIOTICS: None  HISTORY OF PRESENT ILLNESS:  Patient currently cannot give history, this is taken from conversation with his wife. This is a 35 M who never went to a doctor, not on any meds, he has baseline dyspnea and chronic productive cough (chronic bronchitis), apparently had been worsening for 2 weeks, did no go to a doctor still. He was also complaining of acute chest pain a few hours ago. He was last seen normal about an hour before being found unresponsive by family members, pulseless. EMS arrived and CPR started x 2 minutes, got shocked x 2 for VT. No epi given, had ROSC. Amio 150 mg given x 1. King airway at the field, ET tube placed in ED. He did have a fever 3 days ago. Current EKG in ED showed some ST wave depression on the lateral leads. CXR showed hyperinflation without acute opacity.  Used to smoke a lot but quit 3 yeas ago, no EtOH, no drug abuse. No sick contact, no recent travel.  PAST MEDICAL HISTORY :  No past medical history on file. No past surgical history on file. Prior to Admission medications   Not on File   Allergies not on file  FAMILY HISTORY:  No family history on file. SOCIAL HISTORY:  has no tobacco, alcohol, and drug history on file.  REVIEW OF SYSTEMS:  Unable to obtain due to altered mental status  SUBJECTIVE:   VITAL SIGNS: Pulse Rate:  [119] 119 (02/09 0320) Resp:  [15] 15 (02/09 0320) BP: (109-150)/(74-94) 109/74 mmHg (02/09 0320) SpO2:  [100 %] 100 % (02/09 0320) FiO2  (%):  [100 %] 100 % (02/09 0320) Weight:  [77.111 kg (170 lb)] 77.111 kg (170 lb) (02/09 0318) HEMODYNAMICS:   VENTILATOR SETTINGS: Vent Mode:  [-] PRVC FiO2 (%):  [100 %] 100 % Set Rate:  [14 bmp] 14 bmp Vt Set:  [500 mL] 500 mL PEEP:  [5 cmH20] 5 cmH20 Plateau Pressure:  [15 cmH20] 15 cmH20 INTAKE / OUTPUT: Intake/Output   None     PHYSICAL EXAMINATION: General:  Intubated, sedated, withdraws all extremities to pain Neuro:  Withdraws all extremities to pain, no facial droop HEENT:  Intubated, no stridor, no enlarged LN, no trauma seen Cardiovascular:  RRR, no loud murmur Lungs:  Prominent bilateral expiratory wheeze, no rales Abdomen:  Soft, no guarding Musculoskeletal:  No leg edema, no clubbing Skin:  No acute rash, no sacral ulcer  LABS:  CBC No results found for this basename: WBC, HGB, HCT, PLT,  in the last 168 hours Coag's No results found for this basename: APTT, INR,  in the last 168 hours BMET No results found for this basename: NA, K, CL, CO2, BUN, CREATININE, GLUCOSE,  in the last 168 hours Electrolytes No results found for this basename: CALCIUM, MG, PHOS,  in the last 168 hours Sepsis Markers No results found for this basename: LATICACIDVEN, PROCALCITON, O2SATVEN,  in the last 168 hours ABG  Recent Labs Lab 06/12/13 0332  PHART 7.291*  PCO2ART 51.3*  PO2ART 259.0*  Liver Enzymes No results found for this basename: AST, ALT, ALKPHOS, BILITOT, ALBUMIN,  in the last 168 hours Cardiac Enzymes No results found for this basename: TROPONINI, PROBNP,  in the last 168 hours Glucose  Recent Labs Lab 06/12/13 0327  GLUCAP 207*    Imaging No results found.   CXR:  Endotracheal tube tip projects 5.7 cm above the carina, nasogastric tube, side port projects at gastroesophageal junction. COPD without acute cardiopulmonary process.   ASSESSMENT / PLAN:  PULMONARY A: respiratory failure due to cardiac arrest COPD exacerbation Chronic  bronchitis P:   Intubated, wean as tolerated, on PCV with prolonged expiratory phase to facilitate complete exhalation Lung protective strategy Avoid propofol due to possible cardiac suppression VAP prevention bundle Duoneb and pulmicort inhaled scheduled. Solumedrol. Avoid using azithro or levaquin due to recent VT, will use doxycyline instead. Tracheal aspirate culture ordered  CARDIOVASCULAR A: Cardiac arrest due to VT P:  Patient going to cath lab now. ASA given. Check lipid profile, HbA1c Check electrolytes and correct them all, target Mg > 2, K > 4 Cardio on board for further management (for statin, heparin, second antiplatelet, ACE, BB, etc)  RENAL A:  Needs to check renal function P:   monitor  GASTROINTESTINAL A:  No acute issue P:   Early enteral feed when able PPI prophylaxis  HEMATOLOGIC A:  CBC pending P:  monitor  INFECTIOUS A:  No acute evidence of infection P:   Abx given for COPD exacerbation  ENDOCRINE A:  Check HbA1c P:   monitor  NEUROLOGIC A:  Altered mental status s/p cardiac arrest P:   Needs CT scan STAT (ordered) If no brain bleed, then continue hypothermia protocol  TODAY'S SUMMARY: cardiac arrest (VT) s/p shock x 2, CPR 2 minutes, amio x 1. Intubated. Going to cath lab.  I have personally obtained a history, examined the patient, evaluated laboratory and imaging results, formulated the assessment and plan and placed orders. CRITICAL CARE: The patient is critically ill with multiple organ systems failure and requires high complexity decision making for assessment and support, frequent evaluation and titration of therapies, application of advanced monitoring technologies and extensive interpretation of multiple databases. Critical Care Time devoted to patient care services described in this note is 45 minutes.    Pulmonary and [place] Pager: [phone]  06/12/2013, 3:48 AM

## 2013-06-12 NOTE — Procedures (Signed)
Central Venous Catheter Insertion Procedure Note Timothy [name] [id number] [birth year]  Procedure: Insertion of Central Venous Catheter Indications: Assessment of intravascular volume and Drug and/or fluid administration  Procedure Details Consent: Risks of procedure as well as the alternatives and risks of each were explained to the (patient/caregiver).  Consent for procedure obtained. Time Out: Verified patient identification, verified procedure, site/side was marked, verified correct patient position, special equipment/implants available, medications/allergies/relevent history reviewed, required imaging and test results available.  Performed  Maximum sterile technique was used including antiseptics, cap, gloves, gown, hand hygiene, mask and sheet. Skin prep: Chlorhexidine; local anesthetic administered A antimicrobial bonded/coated triple lumen catheter was placed in the right internal jugular vein using the Seldinger technique.  Evaluation Blood flow good Complications: No apparent complications Patient did tolerate procedure well. Chest X-ray ordered to verify placement.  CXR: pending.  Performed by Georgann Housekeeper ACNP with resident MD under direct attending MD supervision using ultrasound guidance.   Timothy Mende V.

## 2013-06-12 NOTE — Consult Note (Signed)
CARDIOLOGY CONSULT NOTE  Patient ID: Timothy [name]  MRN: [id number]  DOB: 1955-12-22  Admit date: 06/12/2013 Date of Consult: 06/12/2013  Primary Physician: No primary provider on file. Primary Cardiologist: None  Reason for Consultation: VF Arrest  History of Present Illness: Timothy [name] is a 58 y.o. male without prior medical issues, not previously on meds, who was found unresponsive in his home, EMS called, received CPR, 2 shocks for VF/VT, amio bolus x1 given, and hypothermia protocol initiated in the field.  He was last seen an hour before the arrest doing well.  Admitted by critical care team who obtained history from patient's wife as he is currently intubated, sedated.  Apparently the patietn has had 2 weeks of worsening dyspnea, cough, and had some chest pain a few hours before his arrest.  In the ED, he was tachycardic but with stable BP, EKG showed sinus tachycardia with <41mm lateral T-wave depressions  History reviewed. No pertinent past medical history.  No past surgical history on file.   Home Meds: Prior to Admission medications   None    Current Medications: . artificial tears  1 application Both Eyes J8S  . [facility] HOLD] aspirin  300 mg Rectal NOW  . budesonide (PULMICORT) nebulizer solution  0.25 mg Nebulization BID  . doxycycline (VIBRAMYCIN) IV  100 mg Intravenous Q12H  . ipratropium-albuterol  3 mL Nebulization Q4H  . methylPREDNISolone (SOLU-MEDROL) injection  125 mg Intravenous Once  . methylPREDNISolone (SOLU-MEDROL) injection  40 mg Intravenous Q8H  . metoprolol tartrate  25 mg Oral BID  . pantoprazole (PROTONIX) IV  40 mg Intravenous QHS     Allergies:   No Known Allergies  Social History:   Unable to obtain Family History:   Unable to obtain  ROS:  Unable to obtain  Vital Signs: Blood pressure 149/77, pulse 132, temperature 97.7 F (36.5 C), temperature source Core (Comment), resp. rate 21, height 5\' 6"  (1.676 m), weight 77.111 kg (170 lb), SpO2  100.00%.   PHYSICAL EXAM: General:  Intubated, sedated HEENT: intubated Lymph: no adenopathy Neck: no JVD Endocrine:  No thryomegaly Vascular: No carotid bruits; FA pulses 2+ bilaterally without bruits Cardiac:  Tachycardic, normal S1, S2 Lungs:  Coarse breathsounds bilaterally Abd: soft, nontender, no hepatomegaly Ext: no edema Musculoskeletal:  No deformities, BUE and BLE strength normal and equal Skin: warm and dry   EKG: sinus tachy with lateral TW depressions  Labs: No results found for this basename: CKTOTAL, CKMB, TROPONINI,  in the last 72 hours Lab Results  Component Value Date   WBC 19.6* 06/12/2013   HGB 16.0 06/12/2013   HCT 47.0 06/12/2013   MCV 90.2 06/12/2013   PLT 289 06/12/2013    Recent Labs Lab 06/12/13 0348 06/12/13 0411  NA 138 138  K 4.4 5.1  CL 103 102  CO2 18*  --   BUN 16 22  CREATININE 0.94 1.10  CALCIUM 8.3*  --   PROT 6.8  --   BILITOT 0.3  --   ALKPHOS 89  --   ALT 38  --   AST 59*  --   GLUCOSE 190* 190*   No results found for this basename: CHOL, HDL, LDLCALC, TRIG   No results found for this basename: DDIMER    Radiology/Studies:  Dg Chest Portable 1 View  06/12/2013   CLINICAL DATA:  Cardiac arrest, endotracheal tube.  EXAM: PORTABLE CHEST - 1 VIEW  COMPARISON:  None available for comparison at time of study interpretation.  FINDINGS: Cardiomediastinal silhouette is unremarkable. Mild interstitial prominence suggesting chronicity, with bullous change of the right lung apex. No pleural effusions or focal consolidations. No pneumothorax.  Endotracheal tube tip projects 5.7 cm above the carina. Nasogastric tube side port projects at the gastroesophageal junction, distal tip not imaged.  Multiple EKG lines overlie the patient and may obscure subtle underlying pathology. Soft tissue planes and included osseous structures are nonsuspicious.  IMPRESSION: Endotracheal tube tip projects 5.7 cm above the carina, nasogastric tube, side port projects  at gastroesophageal junction.  COPD without acute cardiopulmonary process.   Electronically Signed   By: Elon Alas   On: 06/12/2013 03:55    ASSESSMENT AND PLAN:  1. VF/VT arrest, ROSC after 2 rounds of ACLS, hypothermia initiated, to cath lab emergently - 100% mid RCA occlusion on cath with successful PCI with DES. - Continue brilinta, aspirin, IV nitroglycerin - Titrate metoprolol as able. - Left ventriculography reveals an ejection fraction of 50-55% with mild inferior hypocontractility. There is no evidence for mitral regurgitation.  - Start Atorvastatin 80. - Will continue to follow.   Cleotis Lema 06/12/2013 5:41 AM

## 2013-06-12 NOTE — Progress Notes (Signed)
Subjective: Pt presented with VF/VT arrest at home and had ROSC after CPR and LHC on 06/12/13 revealed total mid RCA occlusion successfully stented with DES. Pt tolerated procedure well and hypothermia protocol initiated.   Pt sedated and intubated this AM. Tele ST.  Objective: Vital signs in last 24 hours: Filed Vitals:   06/12/13 0418 06/12/13 0600 06/12/13 0628 06/12/13 0630  BP:  166/117  134/94  Pulse: 110 91    Temp:  91.4 F (33 C)  91 F (32.8 C)  TempSrc:  Core (Comment)  Core (Comment)  Resp:  20    Height:  5\' 6"  (1.676 m)    Weight:  141 lb 1.5 oz (64 kg)    SpO2:  100% 100%    Weight change:   Intake/Output Summary (Last 24 hours) at 06/12/13 0723 Last data filed at 06/12/13 0630  Gross per 24 hour  Intake  24.61 ml  Output   1650 ml  Net -1625.39 ml   General: sedated, RASS -1 HEENT: PERRL, no scleral icterus Cardiac: RRR, no rubs, murmurs or gallops Pulm: expiratory crackles, on vent Abd: soft, nondistended, BS present Ext: warm and well perfused, no pedal edema  Lab Results: Basic Metabolic Panel:  Recent Labs Lab 06/12/13 0348 06/12/13 0411  NA 138 138  K 4.4 5.1  CL 103 102  CO2 18*  --   GLUCOSE 190* 190*  BUN 16 22  CREATININE 0.94 1.10  CALCIUM 8.3*  --   MG 2.2  --   PHOS 3.6  --    Liver Function Tests:  Recent Labs Lab 06/12/13 0348  AST 59*  ALT 38  ALKPHOS 89  BILITOT 0.3  PROT 6.8  ALBUMIN 3.2*   CBC:  Recent Labs Lab 06/12/13 0318 06/12/13 0411 06/12/13 0638  WBC 19.6*  --  18.0*  NEUTROABS  --   --  PENDING  HGB 15.0 16.0 14.5  HCT 43.4 47.0 42.3  MCV 90.2  --  89.6  PLT 289  --  267   CBG:  Recent Labs Lab 06/12/13 0327  GLUCAP 207*   Coagulation:  Recent Labs Lab 06/12/13 0638  LABPROT 20.7*  INR 1.84*   Urinalysis:  Recent Labs Lab 06/12/13 0415  COLORURINE RED*  LABSPEC 1.030  PHURINE 7.0  GLUCOSEU 500*  HGBUR LARGE*  BILIRUBINUR NEGATIVE  KETONESUR 15*  PROTEINUR NEGATIVE   UROBILINOGEN 0.2  NITRITE NEGATIVE  LEUKOCYTESUR SMALL*   Cardiac studies: 06/12/13 LHC: EF 50-55%, mild inferior hypocontractility w/o MR, mid RCA total occlusion s/p Xience 2.5x8mm DES.  Imaging: Dg Chest Portable 1 View  06/12/2013   CLINICAL DATA:  Cardiac arrest, endotracheal tube.  EXAM: PORTABLE CHEST - 1 VIEW  COMPARISON:  None available for comparison at time of study interpretation.  FINDINGS: Cardiomediastinal silhouette is unremarkable. Mild interstitial prominence suggesting chronicity, with bullous change of the right lung apex. No pleural effusions or focal consolidations. No pneumothorax.  Endotracheal tube tip projects 5.7 cm above the carina. Nasogastric tube side port projects at the gastroesophageal junction, distal tip not imaged.  Multiple EKG lines overlie the patient and may obscure subtle underlying pathology. Soft tissue planes and included osseous structures are nonsuspicious.  IMPRESSION: Endotracheal tube tip projects 5.7 cm above the carina, nasogastric tube, side port projects at gastroesophageal junction.  COPD without acute cardiopulmonary process.   Electronically Signed   By: Elon Alas   On: 06/12/2013 03:55   Medications: I have reviewed the patient's current medications. Scheduled Meds: .  antiseptic oral rinse  15 mL Mouth Rinse QID  . artificial tears  1 application Both Eyes A1O  . aspirin EC  81 mg Oral Daily  . atorvastatin  80 mg Oral q1800  . budesonide (PULMICORT) nebulizer solution  0.25 mg Nebulization BID  . chlorhexidine  15 mL Mouth Rinse BID  . doxycycline (VIBRAMYCIN) IV  100 mg Intravenous BID  . ipratropium-albuterol  3 mL Nebulization Q4H  . methylPREDNISolone (SOLU-MEDROL) injection  125 mg Intravenous Once  . methylPREDNISolone (SOLU-MEDROL) injection  40 mg Intravenous Q8H  . metoprolol tartrate  25 mg Oral BID  . pantoprazole (PROTONIX) IV  40 mg Intravenous QHS  . Ticagrelor  90 mg Oral BID   Continuous Infusions: . sodium  chloride 125 mL/hr at 06/12/13 0630  . sodium chloride    . bivalirudin (ANGIOMAX) infusion 5 mg/mL (Cath Lab,ACS,PCI indication)    . cisatracurium (NIMBEX) infusion 1 mcg/kg/min (06/12/13 0351)  . dexmedetomidine    . fentaNYL infusion INTRAVENOUS 50 mcg/hr (06/12/13 0600)  . midazolam (VERSED) infusion 2 mg/hr (06/12/13 0348)  . nitroGLYCERIN    . norepinephrine (LEVOPHED) Adult infusion     PRN Meds:.sodium chloride, acetaminophen, albuterol, cisatracurium, ondansetron (ZOFRAN) IV Assessment/Plan: Principal Problem:   Cardiac arrest Active Problems:   VT (ventricular tachycardia)   COPD exacerbation  1. VT arrest: Pt presented at home in VF/VT arrest had ROSC after 2 shocks, CPR and amio load then EKG with lateral wall inversions. Pt emergently taken to [facility] on 06/12/13 found to have total mid RCA occlusion s/p 2.5x88mm DES. Hypothermia protocol initiated.  -cont hypothermia protocol -lipid panel, UDS, HgbA1c, TSH pending -cont brilinta, ASA, metoprolol   LOS: 0 days   Clinton Gallant, MD 06/12/2013, 7:23 AM  Patient seen, examined. Available data reviewed. Agree with findings, assessment, and plan as outlined by Dr Algis Liming. Patient does taken to the Cath Lab early this morning after out of hospital ventricular fibrillation arrest. He underwent PCI of an acutely occluded right coronary artery. He is on appropriate early post MI medical therapy with a combination of aspirin and brilinta and high-dose atorvastatin. Metoprolol has been started. Will continue to follow as he is undergoing hypothermia per protocol. Hopefully we'll have good neurologic recovery.  Sherren Mocha, M.D. 06/12/2013 12:09 PM

## 2013-06-12 NOTE — ED Notes (Signed)
Artic Sun started.

## 2013-06-12 NOTE — Progress Notes (Signed)
Name: Timothy [name] MRN: [id number] DOB: [birth year]    ADMISSION DATE:  06/12/2013  REFERRING MD :  ED PRIMARY SERVICE: PCCM  CHIEF COMPLAINT:  Cardiac arrest (VT)  BRIEF PATIENT DESCRIPTION: 58 y.o ex smoker, (quit x 3y) found unresponsive, pulseless sp CPR x 2 minutes, shock x 3 for VT. Intubated. Hypothermia protocol. Recent bronchitic illness x 2 wks   SIGNIFICANT EVENTS / STUDIES:  2/9 Hypothermia protocol 2/9 totally occluded RCA with PTCA/ DES    LINES / TUBES: ET tube 06/12/13 >>> Foley 06/12/2013 >>>  CULTURES: None  ANTIBIOTICS: None  SUBJECTIVE: sedated , paralysed, reached goal temp  VITAL SIGNS: Temp:  [91 F (32.8 C)-97.7 F (36.5 C)] 91.8 F (33.2 C) (02/09 0737) Pulse Rate:  [91-135] 93 (02/09 0737) Resp:  [15-29] 20 (02/09 0737) BP: (109-166)/(73-117) 147/92 mmHg (02/09 0737) SpO2:  [95 %-100 %] 95 % (02/09 0739) FiO2 (%):  [40 %-100 %] 40 % (02/09 0739) Weight:  [64 kg (141 lb 1.5 oz)-77.111 kg (170 lb)] 64 kg (141 lb 1.5 oz) (02/09 0600) HEMODYNAMICS:   VENTILATOR SETTINGS: Vent Mode:  [-] PCV FiO2 (%):  [40 %-100 %] 40 % Set Rate:  [14 bmp-20 bmp] 20 bmp Vt Set:  [500 mL] 500 mL PEEP:  [5 cmH20-8 cmH20] 8 cmH20 Plateau Pressure:  [15 cmH20-18 cmH20] 18 cmH20 INTAKE / OUTPUT: Intake/Output     02/08 0701 - 02/09 0700 02/09 0701 - 02/10 0700   I.V. (mL/kg) 24.6 (0.4)    Total Intake(mL/kg) 24.6 (0.4)    Urine (mL/kg/hr) 1650    Total Output 1650     Net -1625.4            PHYSICAL EXAMINATION: General:  Intubated, sedated Neuro:  Paralysed, pupils pinpoint HEENT:  Intubated, no stridor, no enlarged LN, no trauma seen Cardiovascular:  RRR, no loud murmur Lungs:  Prominent bilateral expiratory wheeze, no rales Abdomen:  Soft, no guarding Musculoskeletal:  No leg edema, no clubbing Skin:  No acute rash, no sacral ulcer  LABS:  CBC  Recent Labs Lab 06/12/13 0318 06/12/13 0411 06/12/13 0638  WBC 19.6*  --  18.0*  HGB 15.0  16.0 14.5  HCT 43.4 47.0 42.3  PLT 289  --  267   Coag's  Recent Labs Lab 06/12/13 0638  APTT 93*  INR 1.84*   BMET  Recent Labs Lab 06/12/13 0348 06/12/13 0411 06/12/13 0638  NA 138 138 136*  K 4.4 5.1 4.3  CL 103 102 103  CO2 18*  --  20  BUN 16 22 PENDING  CREATININE 0.94 1.10 PENDING  GLUCOSE 190* 190* 241*   Electrolytes  Recent Labs Lab 06/12/13 0348 06/12/13 0638  CALCIUM 8.3* 7.5*  MG 2.2 1.8  PHOS 3.6  --    Sepsis Markers  Recent Labs Lab 06/12/13 0415  LATICACIDVEN 3.35*   ABG  Recent Labs Lab 06/12/13 0332 06/12/13 0627  PHART 7.291* 7.319*  PCO2ART 51.3* 41.0  PO2ART 259.0* 249.0*   Liver Enzymes  Recent Labs Lab 06/12/13 0348 06/12/13 0638  AST 59* 53*  ALT 38 34  ALKPHOS 89 90  BILITOT 0.3 0.4  ALBUMIN 3.2* 3.2*   Cardiac Enzymes No results found for this basename: TROPONINI, PROBNP,  in the last 168 hours Glucose  Recent Labs Lab 06/12/13 0327  GLUCAP 207*    Imaging Dg Chest Portable 1 View  06/12/2013   CLINICAL DATA:  Cardiac arrest, endotracheal tube.  EXAM: PORTABLE CHEST - 1 VIEW  COMPARISON:  None available for comparison at time of study interpretation.  FINDINGS: Cardiomediastinal silhouette is unremarkable. Mild interstitial prominence suggesting chronicity, with bullous change of the right lung apex. No pleural effusions or focal consolidations. No pneumothorax.  Endotracheal tube tip projects 5.7 cm above the carina. Nasogastric tube side port projects at the gastroesophageal junction, distal tip not imaged.  Multiple EKG lines overlie the patient and may obscure subtle underlying pathology. Soft tissue planes and included osseous structures are nonsuspicious.  IMPRESSION: Endotracheal tube tip projects 5.7 cm above the carina, nasogastric tube, side port projects at gastroesophageal junction.  COPD without acute cardiopulmonary process.   Electronically Signed   By: Elon Alas   On: 06/12/2013 03:55      CXR:  Endotracheal tube tip projects 5.7 cm above the carina, nasogastric tube, side port projects at gastroesophageal junction. COPD without acute cardiopulmonary process.   ASSESSMENT / PLAN:  PULMONARY A: Acute respiratory failure due to cardiac arrest COPD exacerbation Chronic bronchitis P:   Intubated, change to [facility] /530/RR 20-with prolonged expiratory phase to facilitate complete exhalation VAP prevention bundle Duoneb and pulmicort inhaled scheduled.  Solumedrol. Avoid using azithro or levaquin due to recent VT, will use doxycyline instead. Tracheal aspirate culture ordered  CARDIOVASCULAR A: Cardiac arrest due to VT IWMI - s/p DES to RCA P:  Angiomax to stop soon, sheath removal Cardio on board for further management (for statin, heparin, second antiplatelet, ACE, BB, etc)  RENAL A: At risk AKI, contrast P:   monitor  GASTROINTESTINAL A:  No acute issue P:   Early enteral feed when able PPI prophylaxis  HEMATOLOGIC A:  CBC pending P:  monitor  INFECTIOUS A:  No acute evidence of infection P:   doxy for COPD exacerbation  ENDOCRINE A: Hyperglycemia - ? steroids P:   SSI  Check HbA1c  NEUROLOGIC A:  Altered mental status s/p cardiac arrest P:   Baseline CT scan STAT (ordered)-can be portable   TODAY'S SUMMARY: cardiac arrest (VT) s/p shock x 2, CPR 2 minutes, amio x 1. Intubated. S/p DES to RCA Place central line once off angiomax, to rewarm at 6am 2/10  I have personally obtained a history, examined the patient, evaluated laboratory and imaging results, formulated the assessment and plan and placed orders. CRITICAL CARE: The patient is critically ill with multiple organ systems failure and requires high complexity decision making for assessment and support, frequent evaluation and titration of therapies, application of advanced monitoring technologies and extensive interpretation of multiple databases. Critical Care Time devoted to patient  care services described in this note is 45 minutes.   Rigoberto Noel  Pulmonary and [place] Pager: [phone]  06/12/2013, 8:28 AM

## 2013-06-13 ENCOUNTER — Encounter (HOSPITAL_COMMUNITY): Payer: Self-pay

## 2013-06-13 ENCOUNTER — Inpatient Hospital Stay (HOSPITAL_COMMUNITY): Payer: Medicaid Other

## 2013-06-13 DIAGNOSIS — J9600 Acute respiratory failure, unspecified whether with hypoxia or hypercapnia: Secondary | ICD-10-CM

## 2013-06-13 DIAGNOSIS — J441 Chronic obstructive pulmonary disease with (acute) exacerbation: Secondary | ICD-10-CM

## 2013-06-13 DIAGNOSIS — I214 Non-ST elevation (NSTEMI) myocardial infarction: Secondary | ICD-10-CM

## 2013-06-13 HISTORY — DX: Non-ST elevation (NSTEMI) myocardial infarction: I21.4

## 2013-06-13 LAB — BASIC METABOLIC PANEL
BUN: 5 mg/dL — AB (ref 6–23)
BUN: 5 mg/dL — AB (ref 6–23)
BUN: 6 mg/dL (ref 6–23)
BUN: 8 mg/dL (ref 6–23)
BUN: 6 mg/dL (ref 6–23)
BUN: 7 mg/dL (ref 6–23)
CALCIUM: 7.6 mg/dL — AB (ref 8.4–10.5)
CALCIUM: 8 mg/dL — AB (ref 8.4–10.5)
CALCIUM: 7.7 mg/dL — AB (ref 8.4–10.5)
CALCIUM: 8.2 mg/dL — AB (ref 8.4–10.5)
CHLORIDE: 105 meq/L (ref 96–112)
CHLORIDE: 106 meq/L (ref 96–112)
CO2: 22 meq/L (ref 19–32)
CO2: 21 mEq/L (ref 19–32)
CO2: 20 meq/L (ref 19–32)
CO2: 20 mEq/L (ref 19–32)
CO2: 21 meq/L (ref 19–32)
CO2: 22 mEq/L (ref 19–32)
CREATININE: 0.48 mg/dL — AB (ref 0.50–1.35)
CREATININE: 0.56 mg/dL (ref 0.50–1.35)
CREATININE: 0.64 mg/dL (ref 0.50–1.35)
Calcium: 8 mg/dL — ABNORMAL LOW (ref 8.4–10.5)
Calcium: 8.1 mg/dL — ABNORMAL LOW (ref 8.4–10.5)
Chloride: 101 mEq/L (ref 96–112)
Chloride: 104 mEq/L (ref 96–112)
Chloride: 106 mEq/L (ref 96–112)
Chloride: 108 mEq/L (ref 96–112)
Creatinine, Ser: 0.48 mg/dL — ABNORMAL LOW (ref 0.50–1.35)
Creatinine, Ser: 0.49 mg/dL — ABNORMAL LOW (ref 0.50–1.35)
Creatinine, Ser: 0.49 mg/dL — ABNORMAL LOW (ref 0.50–1.35)
GFR calc Af Amer: >90 mL/min (ref >90)
GFR calc Af Amer: >90 mL/min (ref >90)
GFR calc Af Amer: >90 mL/min (ref >90)
GFR calc Af Amer: >90 mL/min (ref >90)
GFR calc Af Amer: >90 mL/min (ref >90)
GFR calc non Af Amer: >90 mL/min (ref >90)
GLUCOSE: 153 mg/dL — AB (ref 70–99)
GLUCOSE: 160 mg/dL — AB (ref 70–99)
Glucose, Bld: 132 mg/dL — ABNORMAL HIGH (ref 70–99)
Glucose, Bld: 173 mg/dL — ABNORMAL HIGH (ref 70–99)
Glucose, Bld: 198 mg/dL — ABNORMAL HIGH (ref 70–99)
Glucose, Bld: 67 mg/dL — ABNORMAL LOW (ref 70–99)
Potassium: 3 mEq/L — ABNORMAL LOW (ref 3.7–5.3)
Potassium: 3.1 mEq/L — ABNORMAL LOW (ref 3.7–5.3)
Potassium: 3.2 mEq/L — ABNORMAL LOW (ref 3.7–5.3)
Potassium: 3.5 mEq/L — ABNORMAL LOW (ref 3.7–5.3)
Potassium: 3.5 mEq/L — ABNORMAL LOW (ref 3.7–5.3)
Potassium: 4 mEq/L (ref 3.7–5.3)
SODIUM: 143 meq/L (ref 137–147)
Sodium: 138 mEq/L (ref 137–147)
Sodium: 140 mEq/L (ref 137–147)
Sodium: 140 mEq/L (ref 137–147)
Sodium: 141 mEq/L (ref 137–147)
Sodium: 142 mEq/L (ref 137–147)

## 2013-06-13 LAB — GLUCOSE, CAPILLARY
GLUCOSE-CAPILLARY: 110 mg/dL — AB (ref 70–99)
GLUCOSE-CAPILLARY: 143 mg/dL — AB (ref 70–99)
GLUCOSE-CAPILLARY: 173 mg/dL — AB (ref 70–99)
Glucose-Capillary: 122 mg/dL — ABNORMAL HIGH (ref 70–99)
Glucose-Capillary: 161 mg/dL — ABNORMAL HIGH (ref 70–99)
Glucose-Capillary: 140 mg/dL — ABNORMAL HIGH (ref 70–99)
Glucose-Capillary: 113 mg/dL — ABNORMAL HIGH (ref 70–99)

## 2013-06-13 LAB — MAGNESIUM
Magnesium: 1.7 mg/dL (ref 1.5–2.5)
Magnesium: 1.7 mg/dL (ref 1.5–2.5)
Magnesium: 1.4 mg/dL — ABNORMAL LOW (ref 1.5–2.5)
Magnesium: 2.9 mg/dL — ABNORMAL HIGH (ref 1.5–2.5)
Magnesium: 2.1 mg/dL (ref 1.5–2.5)
Magnesium: 2.1 mg/dL (ref 1.5–2.5)

## 2013-06-13 LAB — CBC
HEMATOCRIT: 44.5 % (ref 39.0–52.0)
HEMOGLOBIN: 15.5 g/dL (ref 13.0–17.0)
MCH: 30.9 pg (ref 26.0–34.0)
MCHC: 34.8 g/dL (ref 30.0–36.0)
MCV: 88.8 fL (ref 78.0–100.0)
Platelets: 267 10*3/uL (ref 150–400)
RBC: 5.01 MIL/uL (ref 4.22–5.81)
RDW: 13.1 % (ref 11.5–15.5)
WBC: 15.1 10*3/uL — ABNORMAL HIGH (ref 4.0–10.5)

## 2013-06-13 LAB — URINE CULTURE
CULTURE: NO GROWTH
Colony Count: NO GROWTH

## 2013-06-13 LAB — TROPONIN I: TROPONIN I: 2.94 ng/mL — AB (ref <0.30)

## 2013-06-13 MED ORDER — DEXTROSE 10 % IV SOLN
INTRAVENOUS | Status: DC
  Administered 2013-06-13: 500 mL via INTRAVENOUS
  Administered 2013-06-14: 09:00:00 via INTRAVENOUS

## 2013-06-13 MED ORDER — PROPOFOL 10 MG/ML IV EMUL
5.0000 ug/kg/min | INTRAVENOUS | Status: DC
  Administered 2013-06-13: 15 ug/kg/min via INTRAVENOUS
  Administered 2013-06-14: 50 ug/kg/min via INTRAVENOUS
  Filled 2013-06-13 (×3): qty 100

## 2013-06-13 MED ORDER — METOPROLOL TARTRATE 1 MG/ML IV SOLN
5.0000 mg | Freq: Four times a day (QID) | INTRAVENOUS | Status: DC
  Administered 2013-06-13 – 2013-06-15 (×5): 5 mg via INTRAVENOUS
  Filled 2013-06-13 (×11): qty 5

## 2013-06-13 MED ORDER — SODIUM CHLORIDE 0.9 % IV BOLUS (SEPSIS)
1000.0000 mL | Freq: Once | INTRAVENOUS | Status: AC
  Administered 2013-06-13: 1000 mL via INTRAVENOUS

## 2013-06-13 MED ORDER — DEXTROSE 50 % IV SOLN
INTRAVENOUS | Status: AC
  Administered 2013-06-13: 50 mL
  Filled 2013-06-13: qty 50

## 2013-06-13 MED ORDER — MAGNESIUM SULFATE 40 MG/ML IJ SOLN
2.0000 g | Freq: Once | INTRAMUSCULAR | Status: AC
  Administered 2013-06-13: 2 g via INTRAVENOUS
  Filled 2013-06-13: qty 50

## 2013-06-13 MED ORDER — METOPROLOL TARTRATE 1 MG/ML IV SOLN
INTRAVENOUS | Status: AC
  Administered 2013-06-13: 5 mg via INTRAVENOUS
  Filled 2013-06-13: qty 5

## 2013-06-13 MED ORDER — DEXTROSE 5 % IV SOLN
2.0000 ug/min | INTRAVENOUS | Status: DC
  Administered 2013-06-14: 15 ug/min via INTRAVENOUS
  Filled 2013-06-13 (×2): qty 4

## 2013-06-13 MED FILL — Sodium Chloride IV Soln 0.9%: INTRAVENOUS | Qty: 50 | Status: AC

## 2013-06-13 NOTE — Progress Notes (Signed)
INITIAL NUTRITION ASSESSMENT  DOCUMENTATION CODES Per approved criteria  -Not Applicable   INTERVENTION: 1.  Enteral nutrition; once rewarmed and TFs appropriate, initiate Vital 1.2 @ 20 mL/hr continuous.  Advance by 10 mL q 4 hrs to 55 mL/hr goal to provide 1584 kcal, 99g protein, 1070 mL free water.  NUTRITION DIAGNOSIS: Inadequate oral intake related to inability to eat as evidenced by NPO status, vent.  Monitor:  1.  Enteral nutrition; initiation with tolerance.  Pt to meet >/=90% estimated needs with nutrition support.  2.  Wt/wt change; monitor trends  Reason for Assessment: Vent, consult for TF recommendations  58 y.o. male  Admitting Dx: Cardiac arrest  ASSESSMENT: Pt admitted with cardiac arrest; found to have RCA occlusion.  UDS +THC.  Currently on hypothermia protocol and started rewarming.   Patient is currently intubated on ventilator support.  MV: 10.6 L/min Temp (24hrs), Avg:91.5 F (33.1 C), Min:91 F (32.8 C), Max:91.9 F (33.3 C)  Propofol: none  No family at bedside for additional nutrition information.   Nutrition Focused Physical Exam: Subcutaneous Fat:  Orbital Region: WNL Upper Arm Region: WNL Thoracic and Lumbar Region: WNL  Muscle:  Temple Region: mild wasting Clavicle Bone Region: WNL Clavicle and Acromion Bone Region: WNL Scapular Bone Region: mild wasting Dorsal Hand: WNL Patellar Region: WNL Anterior Thigh Region: unable to assess Posterior Calf Region: unable to assess  Edema: generalized  Height: Ht Readings from Last 1 Encounters:  06/12/13 5\' 6"  (1.676 m)    Weight: Wt Readings from Last 1 Encounters:  06/13/13 141 lb 12.1 oz (64.3 kg)    Ideal Body Weight: 64.5 kg  % Ideal Body Weight: 99%  Wt Readings from Last 10 Encounters:  06/13/13 141 lb 12.1 oz (64.3 kg)  06/13/13 141 lb 12.1 oz (64.3 kg)    Usual Body Weight: unknown  % Usual Body Weight: unable to assess  BMI:  Body mass index is 22.89  kg/(m^2).  Estimated Nutritional Needs: Kcal: 1588 Protein: 90-105g Fluid: ~2.0 L/day  Skin: intact  Diet Order:  NPO  EDUCATION NEEDS: -Education not appropriate at this time   Intake/Output Summary (Last 24 hours) at 06/13/13 1021 Last data filed at 06/13/13 0930  Gross per 24 hour  Intake 4208.8 ml  Output   3115 ml  Net 1093.8 ml    Last BM: 2/9   Labs:   Recent Labs Lab 06/12/13 0348  06/12/13 2000 06/12/13 2345 06/13/13 0400 06/13/13 0432 06/13/13 0840  NA 138  < > 138 138 143  --  142  K 4.4  < > 3.7 3.5* 3.2*  --  3.1*  CL 103  < > 103 101 105  --  108  CO2 18*  < > 22 20 21   --  21  BUN 16  < > 9 8 6   --  6  CREATININE 0.94  < > 0.48* 0.48* 0.49*  --  0.48*  CALCIUM 8.3*  < > 8.0* 8.0* 8.1*  --  7.6*  MG 2.2  < > 1.7  --   --  1.7 1.4*  PHOS 3.6  --   --   --   --   --   --   GLUCOSE 190*  < > 136* 173* 153*  --  160*  < > = values in this interval not displayed.  CBG (last 3)   Recent Labs  06/12/13 1938 06/12/13 2326 06/13/13 0417  GLUCAP 122* 161* 143*    Scheduled Meds: .  antiseptic oral rinse  15 mL Mouth Rinse QID  . artificial tears  1 application Both Eyes H8N  . aspirin  81 mg Oral Daily  . atorvastatin  80 mg Oral q1800  . budesonide (PULMICORT) nebulizer solution  0.25 mg Nebulization BID  . chlorhexidine  15 mL Mouth Rinse BID  . doxycycline (VIBRAMYCIN) IV  100 mg Intravenous BID  . enoxaparin (LOVENOX) injection  40 mg Subcutaneous Q24H  . influenza vac split quadrivalent PF  0.5 mL Intramuscular Tomorrow-1000  . insulin aspart  2-6 Units Subcutaneous Q4H  . ipratropium-albuterol  3 mL Nebulization Q4H  . magnesium sulfate 1 - 4 g bolus IVPB  2 g Intravenous Once  . methylPREDNISolone (SOLU-MEDROL) injection  40 mg Intravenous Q12H  . metoprolol tartrate  25 mg Oral BID  . pantoprazole (PROTONIX) IV  40 mg Intravenous QHS  . pneumococcal 23 valent vaccine  0.5 mL Intramuscular Tomorrow-1000  . Ticagrelor  90 mg Oral BID     Continuous Infusions: . sodium chloride 1,000 mL (06/13/13 0000)  . sodium chloride 1,000 mL (06/12/13 2356)  . sodium chloride 10 mL/hr at 06/12/13 1022  . cisatracurium (NIMBEX) infusion 1 mcg/kg/min (06/12/13 0351)  . fentaNYL infusion INTRAVENOUS 175 mcg/hr (06/13/13 0800)  . midazolam (VERSED) infusion 3 mg/hr (06/13/13 0400)  . norepinephrine (LEVOPHED) Adult infusion 8 mcg/min (06/13/13 0930)    Past Medical History  Diagnosis Date  . Medical history non-contributory     Past Surgical History  Procedure Laterality Date  . No past surgeries      Brynda Greathouse, MS RD LDN Clinical Inpatient Dietitian Pager: [phone] Weekend/After hours pager: [phone]

## 2013-06-13 NOTE — Progress Notes (Signed)
[facility] Progress Note Patient Name: Timothy [name] DOB: 1955-05-29 MRN: [id number]  Date of Service  06/13/2013   HPI/Events of Note   Off hypothermia protocol - agitated Tachycardic Off Levophed  eICU Interventions   Propofol ordered D/c Levophed Has Metoprolol 25 PO bid order ( last given 10 am ); if remain tachycardic will give Metoprolol IV    Intervention Category Major Interventions: Other:  Doree Fudge 06/13/2013, 6:03 PM

## 2013-06-13 NOTE — Progress Notes (Signed)
R femoral arterial sheath d/c'd per MD order at 1400 per protocol. Pressure applied X 30 min. Site "0". CDI. Will continue to monitor. C Henlee Donovan RN.

## 2013-06-13 NOTE — Progress Notes (Signed)
Notified CCM doctor of decreased BP (76/39) and sedation turned down with no improvement in pressures. Orders received for 1 liter NS bolus. Will monitor ns bolus results and notify MD as appropriate.

## 2013-06-13 NOTE — Progress Notes (Signed)
Name: Timothy [name] MRN: [id number] DOB: 05-10-1955    ADMISSION DATE:  06/12/2013  REFERRING MD :  ED PRIMARY SERVICE: PCCM  CHIEF COMPLAINT:  Cardiac arrest (VT)  BRIEF PATIENT DESCRIPTION: 58 y.o ex smoker, (quit x 3y) found unresponsive, pulseless sp CPR x 2 minutes, shock x 3 for VT. Intubated. Hypothermia protocol. Recent bronchitic illness x 2 wks   SIGNIFICANT EVENTS / STUDIES:  2/9 Hypothermia protocol 2/9 totally occluded RCA with PTCA/ DES 2/9 CT portable head: no acute intracranial abnormality, moderate inflammatory maxillary and ethmoidal sinus disease 2/10 Hypotensive requiring pressors overnight, start warming today  LINES / TUBES: ET tube 06/12/13 >>> Foley 06/12/2013 >>> RIJ 06/12/2013>>>  CULTURES: Tracheal aspirate 2/9>>>oral flora  ANTIBIOTICS: Doxycycline 2/9>>>  SUBJECTIVE: sedated , paralysed on mechanical ventilation Being rewarmed  VITAL SIGNS: Temp:  [91 F (32.8 C)-91.9 F (33.3 C)] 91.8 F (33.2 C) (02/10 0900) Pulse Rate:  [61-130] 84 (02/10 0900) Resp:  [19-20] 20 (02/10 0900) BP: (95-182)/(64-107) 95/64 mmHg (02/10 0900) SpO2:  [96 %-99 %] 97 % (02/10 0900) FiO2 (%):  [40 %-70 %] 40 % (02/10 0732) Weight:  [141 lb 12.1 oz (64.3 kg)] 141 lb 12.1 oz (64.3 kg) (02/10 0500) HEMODYNAMICS: CVP:  [5 mmHg-7 mmHg] 5 mmHg VENTILATOR SETTINGS: Vent Mode:  [-] PRVC FiO2 (%):  [40 %-70 %] 40 % Set Rate:  [20 bmp] 20 bmp Vt Set:  [530 mL] 530 mL PEEP:  [5 cmH20] 5 cmH20 Plateau Pressure:  [16 cmH20-19 cmH20] 16 cmH20 INTAKE / OUTPUT: Intake/Output     02/09 0701 - 02/10 0700 02/10 0701 - 02/11 0700   I.V. (mL/kg) 3543.4 (55.1) 513.1 (8)   NG/GT 60 60   IV Piggyback 700 250   Total Intake(mL/kg) 4303.4 (66.9) 823.1 (12.8)   Urine (mL/kg/hr) 3460 (2.2) 5 (0)   Emesis/NG output 400 (0.3)    Total Output 3860 5   Net +443.4 +818.1          PHYSICAL EXAMINATION: General:  Intubated, sedated Neuro:  Paralysed HEENT:  Intubated, pinpoint  pupils Cardiovascular:  RRR Lungs:  Coarse b/l breath sounds Abdomen:  Soft Musculoskeletal:  -edema Skin:  pale  LABS:  CBC  Recent Labs Lab 06/12/13 0318  06/12/13 0638 06/12/13 0809 06/12/13 1002 06/13/13 0400  WBC 19.6*  --  18.0*  --   --  15.1*  HGB 15.0  < > 14.5 15.6 15.3 15.5  HCT 43.4  < > 42.3 46.0 45.0 44.5  PLT 289  --  267  --   --  267  < > = values in this interval not displayed. Coag's  Recent Labs Lab 06/12/13 0638 06/12/13 1600  APTT 93* 43*  INR 1.84* 1.32   BMET  Recent Labs Lab 06/12/13 2345 06/13/13 0400 06/13/13 0840  NA 138 143 142  K 3.5* 3.2* 3.1*  CL 101 105 108  CO2 20 21 21   BUN 8 6 6   CREATININE 0.48* 0.49* 0.48*  GLUCOSE 173* 153* 160*   Electrolytes  Recent Labs Lab 06/12/13 0348  06/12/13 2000 06/12/13 2345 06/13/13 0400 06/13/13 0432 06/13/13 0840  CALCIUM 8.3*  < > 8.0* 8.0* 8.1*  --  7.6*  MG 2.2  < > 1.7  --   --  1.7 1.4*  PHOS 3.6  --   --   --   --   --   --   < > = values in this interval not displayed. Sepsis Markers  Recent Labs  Lab 06/12/13 0415  LATICACIDVEN 3.35*   ABG  Recent Labs Lab 06/12/13 0332 06/12/13 0627  PHART 7.291* 7.319*  PCO2ART 51.3* 41.0  PO2ART 259.0* 249.0*   Liver Enzymes  Recent Labs Lab 06/12/13 0348 06/12/13 0638  AST 59* 53*  ALT 38 34  ALKPHOS 89 90  BILITOT 0.3 0.4  ALBUMIN 3.2* 3.2*   Cardiac Enzymes  Recent Labs Lab 06/12/13 1147 06/12/13 1600 06/12/13 2200  TROPONINI 5.76* 4.42* 2.94*   Glucose  Recent Labs Lab 06/12/13 1704 06/12/13 1755 06/12/13 1859 06/12/13 1938 06/12/13 2326 06/13/13 0417  GLUCAP 102* 109* 110* 122* 161* 143*   Imaging Dg Chest Port 1 View  06/13/2013   CLINICAL DATA:  Hypoxia  EXAM: PORTABLE CHEST - 1 VIEW  COMPARISON:  June 12, 2013  FINDINGS: Endotracheal tube tip is 4.8 cm above the carina. Central catheter tip is in the superior vena cava. Nasogastric tube tip and side port are below the diaphragm. No  pneumothorax. Patchy interstitial edema remains. There is no new opacity. The heart size and pulmonary vascularity are normal.  IMPRESSION: Tube and catheter positions as described without pneumothorax. Stable interstitial edema. No new opacity.   Electronically Signed   By: Bretta Bang M.D.   On: 06/13/2013 07:05   Dg Chest Port 1 View  06/12/2013   CLINICAL DATA:  Line placement  EXAM: PORTABLE CHEST - 1 VIEW  COMPARISON:  DG CHEST 1V PORT dated 06/12/2013  FINDINGS: There is a right jugular central venous catheter with the tip projecting over the SVC. There is a nasogastric tube coursing below the diaphragm. There is an endotracheal tube in satisfactory position. There is bilateral mild interstitial thickening. There is no pleural effusion, focal parenchymal opacity or pneumothorax. The heart and mediastinal contours are unremarkable.  The osseous structures are unremarkable.  IMPRESSION: Right jugular central venous catheter with the tip projecting over the SVC.   Electronically Signed   By: Elige Ko   On: 06/12/2013 12:07   Dg Chest Portable 1 View  06/12/2013   CLINICAL DATA:  Cardiac arrest, endotracheal tube.  EXAM: PORTABLE CHEST - 1 VIEW  COMPARISON:  None available for comparison at time of study interpretation.  FINDINGS: Cardiomediastinal silhouette is unremarkable. Mild interstitial prominence suggesting chronicity, with bullous change of the right lung apex. No pleural effusions or focal consolidations. No pneumothorax.  Endotracheal tube tip projects 5.7 cm above the carina. Nasogastric tube side port projects at the gastroesophageal junction, distal tip not imaged.  Multiple EKG lines overlie the patient and may obscure subtle underlying pathology. Soft tissue planes and included osseous structures are nonsuspicious.  IMPRESSION: Endotracheal tube tip projects 5.7 cm above the carina, nasogastric tube, side port projects at gastroesophageal junction.  COPD without acute cardiopulmonary  process.   Electronically Signed   By: Awilda Metro   On: 06/12/2013 03:55   Ct Portable Head W/o Cm  06/12/2013   CLINICAL DATA:  Cardiac arrest  EXAM: CT HEAD WITHOUT CONTRAST  TECHNIQUE: Contiguous axial images were obtained from the base of the skull through the vertex without intravenous contrast.  COMPARISON:  None available  FINDINGS: There is no acute intracranial hemorrhage or infarct. No mass lesion or midline shift. Gray-white matter differentiation is well maintained. No evidence of significant cerebral edema or anoxia at this time. Ventricles are normal in size without evidence of hydrocephalus. CSF containing spaces are within normal limits. Basilar cisterns are widely patent. No extra-axial fluid collection.  The calvarium is  intact.  Orbital soft tissues are within normal limits.  Endotracheal and enteric tubes are partially visualized. Moderate mucoperiosteal thickening present within the maxillary sinuses and ethmoidal air cells bilaterally. No mastoid effusion.  Scalp soft tissues are unremarkable.  IMPRESSION: 1. No acute intracranial abnormality identified. No evidence of significant cerebral anoxia or edema at this time. 2. Moderate inflammatory maxillary and ethmoidal sinus disease.   Electronically Signed   By: Jeannine Boga M.D.   On: 06/12/2013 12:38   CXR:  ETT 4.8cm above carina, central catheter tip in SVC, no pneumothorax, stable interstitial edema  ASSESSMENT / PLAN:  PULMONARY A: Acute respiratory failure due to cardiac arrest COPD exacerbation Chronic bronchitis P:   Remains intubated, on PRVC 530/20/5/40%  VAP prevention bundle Duoneb and pulmicort inhaled scheduled.  Solumedrol Continue doxycycline Tracheal aspirate culture pending  CARDIOVASCULAR A: Cardiac arrest due to VT IWMI - s/p DES to RCA Hypotension--on Levo P:  Per cardiology, Cardio on board for further management (for statin, heparin, second antiplatelet, ACE, BB, etc); will rearm  by 6p  RENAL A: Hypokalemia, K 3.1 and Mag 1.4 Decreased urine output, foley in place (30cc since 6am) P:   Replaced electrolytes continue to monitor  GASTROINTESTINAL A:  No acute issue P:   Early enteral feed when able PPI prophylaxis  HEMATOLOGIC A:  CBC pending P:  monitor  INFECTIOUS A:  No acute evidence of infection, leukocytosis trending down P:   doxy for COPD exacerbation  ENDOCRINE A: Hyperglycemia - ? Steroids, HbA1C 5.9 P:   SSI  NEUROLOGIC A:  Altered mental status s/p cardiac arrest CT head negative for acute findings P:   On versed, fentanyl and nimbex Neuro chk after rewarmed, off sedation  Signed: Jerene Pitch, MD PGY-2, Internal Medicine Resident Pager: [phone]  06/13/2013,10:39 AM  TODAY'S SUMMARY: hypotensive overnight requiring pressors, to rewarm by 6pm  I have personally obtained a history, examined the patient, evaluated laboratory and imaging results, formulated the assessment and plan and placed orders. CRITICAL CARE: The patient is critically ill with multiple organ systems failure and requires high complexity decision making for assessment and support, frequent evaluation and titration of therapies, application of advanced monitoring technologies and extensive interpretation of multiple databases. Critical Care Time devoted to patient care services described in this note is 45 minutes.    Rigoberto Noel  Pulmonary and [place] Pager: [phone]  06/13/2013, 10:30 AM

## 2013-06-13 NOTE — Progress Notes (Signed)
Utilization Review Completed.  

## 2013-06-13 NOTE — Progress Notes (Signed)
Patient still hypotensive and CBG was 66. Notified CCM doctor of results and patient becoming very agitated with the reduced sedation. He was sitting up in bed and not tolerating the ventilator. Also, mentioned copious tan and brown secretions from ETT and mouth. Levophed restarted and sedation increased. Titrating medication to balance BP and agitation. D10 gtt started for low CBG's. Will notify CCM as needed.

## 2013-06-13 NOTE — Progress Notes (Addendum)
Pt MAE X 4 with non purposeful movement. Opens eyes to command & tracks to voice from right to left. Pt HR 160. BP 168/99. Pupils equal & reactive. Levo gtt off at 1730. Pt aggitated with CPOT =8.  MD notified. RT notified. Pt remains with 1.5 more hours for protocol. Comfort measures & sedation on board.

## 2013-06-13 NOTE — Progress Notes (Signed)
[facility] ADULT ICU REPLACEMENT PROTOCOL FOR AM LAB REPLACEMENT ONLY  The patient does not apply for the [facility] Adult ICU Electrolyte Replacment Protocol based on the criteria listed below:   1. Is GFR >/= 40 ml/min? no  Patient's GFR today is  2. Is urine output >/= 0.5 ml/kg/hr for the last 6 hours? no Patient's UOP is  ml/kg/hr 3. Is BUN < 60 mg/dL? no  Patient's BUN today is  4. Abnormal electrolyte(s): K 3.25. Ordered repletion with: NA  To rewarm at 06:30 6. If a panic level lab has been reported, has the CCM MD in charge been notified? yes.   Physician:  Dr Enis Gash, Lawsyn Heiler A 06/13/2013 6:16 AM

## 2013-06-13 NOTE — Progress Notes (Signed)
Patient seen, examined. Available data reviewed. Agree with findings, assessment, and plan as outlined by Dr Algis Liming. Exam reveals an intubated, sedated gentleman. Lungs are clear. Heart is regular rate and rhythm without murmurs or gallops. There is no peripheral edema. He has required norepinephrine to maintain a mean arterial pressure of 80 mm mercury. Otherwise he has been stable. He's had no significant arrhythmias. His post MI medical therapy was reviewed and it is appropriate with aspirin, brilinta, and a statin drug. Will continue to follow as he is rewarmed and we'll plan on assuming his care once he is off of the ventilator.  Sherren Mocha, M.D. 06/13/2013 11:16 AM

## 2013-06-13 NOTE — Progress Notes (Signed)
Subjective: Pt required some levo overnight currently still on 32mcg/min. VSS.   Pt sedated and intubated this AM. Tele ST.  Objective: Vital signs in last 24 hours: Filed Vitals:   06/13/13 0500 06/13/13 0557 06/13/13 0600 06/13/13 0650  BP: 140/74  112/72   Pulse: 68 67 71 68  Temp: 91.4 F (33 C)  91.2 F (32.9 C) 91.4 F (33 C)  TempSrc: Core (Comment)  Core (Comment) Core (Comment)  Resp: 20 20 20 20   Height:      Weight: 141 lb 12.1 oz (64.3 kg)     SpO2: 97% 99% 99% 98%   Weight change: -28 lb 3.9 oz (-12.812 kg)  Intake/Output Summary (Last 24 hours) at 06/13/13 0732 Last data filed at 06/13/13 0600  Gross per 24 hour  Intake   4162 ml  Output   3860 ml  Net    302 ml   General: sedated, RASS -1 HEENT: PERRL, no scleral icterus Cardiac: RRR, no rubs, murmurs or gallops Pulm: expiratory crackles, on vent Abd: soft, nondistended, BS present Ext: warm and well perfused, no pedal edema  Lab Results: Basic Metabolic Panel:  Recent Labs Lab 06/12/13 0348  06/12/13 2000 06/12/13 2345 06/13/13 0400 06/13/13 0432  NA 138  < > 138 138 143  --   K 4.4  < > 3.7 3.5* 3.2*  --   CL 103  < > 103 101 105  --   CO2 18*  < > 22 20 21   --   GLUCOSE 190*  < > 136* 173* 153*  --   BUN 16  < > 9 8 6   --   CREATININE 0.94  < > 0.48* 0.48* 0.49*  --   CALCIUM 8.3*  < > 8.0* 8.0* 8.1*  --   MG 2.2  < > 1.7  --   --  1.7  PHOS 3.6  --   --   --   --   --   < > = values in this interval not displayed. Liver Function Tests:  Recent Labs Lab 06/12/13 0348 06/12/13 0638  AST 59* 53*  ALT 38 34  ALKPHOS 89 90  BILITOT 0.3 0.4  PROT 6.8 6.6  ALBUMIN 3.2* 3.2*   CBC:  Recent Labs Lab 06/12/13 0411 06/12/13 0638  06/12/13 1002 06/13/13 0400  WBC  --  18.0*  --   --  15.1*  NEUTROABS  --  14.0*  --   --   --   HGB 16.0 14.5  < > 15.3 15.5  HCT 47.0 42.3  < > 45.0 44.5  MCV  --  89.6  --   --  88.8  PLT  --  267  --   --  267  < > = values in this interval not  displayed. CBG:  Recent Labs Lab 06/12/13 1704 06/12/13 1755 06/12/13 1859 06/12/13 1938 06/12/13 2326 06/13/13 0417  GLUCAP 102* 109* 110* 122* 161* 143*   Coagulation:  Recent Labs Lab 06/12/13 0638 06/12/13 1600  LABPROT 20.7* 16.1*  INR 1.84* 1.32   Cardiac studies: 06/12/13 LHC: EF 50-55%, mild inferior hypocontractility w/o MR, mid RCA total occlusion s/p Xience 2.5x57mm DES.  Medications: I have reviewed the patient's current medications. Scheduled Meds: . antiseptic oral rinse  15 mL Mouth Rinse QID  . artificial tears  1 application Both Eyes O2U  . aspirin  81 mg Oral Daily  . atorvastatin  80 mg Oral q1800  . budesonide (PULMICORT)  nebulizer solution  0.25 mg Nebulization BID  . chlorhexidine  15 mL Mouth Rinse BID  . doxycycline (VIBRAMYCIN) IV  100 mg Intravenous BID  . enoxaparin (LOVENOX) injection  40 mg Subcutaneous Q24H  . influenza vac split quadrivalent PF  0.5 mL Intramuscular Tomorrow-1000  . insulin aspart  2-6 Units Subcutaneous Q4H  . ipratropium-albuterol  3 mL Nebulization Q4H  . methylPREDNISolone (SOLU-MEDROL) injection  40 mg Intravenous Q12H  . metoprolol tartrate  25 mg Oral BID  . pantoprazole (PROTONIX) IV  40 mg Intravenous QHS  . pneumococcal 23 valent vaccine  0.5 mL Intramuscular Tomorrow-1000  . Ticagrelor  90 mg Oral BID   Continuous Infusions: . sodium chloride 1,000 mL (06/13/13 0000)  . sodium chloride 1,000 mL (06/12/13 2356)  . sodium chloride 10 mL/hr at 06/12/13 1022  . cisatracurium (NIMBEX) infusion 1 mcg/kg/min (06/12/13 0351)  . fentaNYL infusion INTRAVENOUS 150 mcg/hr (06/13/13 0400)  . midazolam (VERSED) infusion 3 mg/hr (06/13/13 0400)  . norepinephrine (LEVOPHED) Adult infusion 7 mcg/min (06/13/13 0557)   PRN Meds:.sodium chloride, acetaminophen, albuterol, cisatracurium, ondansetron (ZOFRAN) IV Assessment/Plan: Principal Problem:   Cardiac arrest Active Problems:   VT (ventricular tachycardia)   COPD  exacerbation  1. VT arrest: Pt presented at home in VF/VT arrest had ROSC after 2 shocks, CPR and amio load then EKG with lateral wall inversions. Pt emergently taken to [facility] on 06/12/13 found to have total mid RCA occlusion s/p 2.5x38mm DES. Hypothermia protocol initiated. UDS +THC, HgbA1c 5.9, LDL 77.  -start warming patient -cont brilinta, ASA, metoprolol, statin   LOS: 1 day   Clinton Gallant, MD 06/13/2013, 7:32 AM

## 2013-06-14 ENCOUNTER — Encounter: Payer: Self-pay | Admitting: Cardiovascular Disease

## 2013-06-14 DIAGNOSIS — I214 Non-ST elevation (NSTEMI) myocardial infarction: Secondary | ICD-10-CM

## 2013-06-14 LAB — GLUCOSE, CAPILLARY
GLUCOSE-CAPILLARY: 124 mg/dL — AB (ref 70–99)
GLUCOSE-CAPILLARY: 66 mg/dL — AB (ref 70–99)
Glucose-Capillary: 133 mg/dL — ABNORMAL HIGH (ref 70–99)
Glucose-Capillary: 71 mg/dL (ref 70–99)
Glucose-Capillary: 165 mg/dL — ABNORMAL HIGH (ref 70–99)
Glucose-Capillary: 152 mg/dL — ABNORMAL HIGH (ref 70–99)
Glucose-Capillary: 113 mg/dL — ABNORMAL HIGH (ref 70–99)
Glucose-Capillary: 109 mg/dL — ABNORMAL HIGH (ref 70–99)
Glucose-Capillary: 100 mg/dL — ABNORMAL HIGH (ref 70–99)

## 2013-06-14 LAB — CULTURE, RESPIRATORY W GRAM STAIN

## 2013-06-14 LAB — BASIC METABOLIC PANEL
BUN: 7 mg/dL (ref 6–23)
CHLORIDE: 110 meq/L (ref 96–112)
CO2: 21 mEq/L (ref 19–32)
Calcium: 7.8 mg/dL — ABNORMAL LOW (ref 8.4–10.5)
Creatinine, Ser: 0.76 mg/dL (ref 0.50–1.35)
GFR calc non Af Amer: >90 mL/min (ref >90)
Glucose, Bld: 187 mg/dL — ABNORMAL HIGH (ref 70–99)
Potassium: 3.5 mEq/L — ABNORMAL LOW (ref 3.7–5.3)
Sodium: 145 mEq/L (ref 137–147)

## 2013-06-14 LAB — CULTURE, RESPIRATORY

## 2013-06-14 LAB — MAGNESIUM: MAGNESIUM: 2.1 mg/dL (ref 1.5–2.5)

## 2013-06-14 MED ORDER — ALUM & MAG HYDROXIDE-SIMETH 200-200-20 MG/5ML PO SUSP: 30.0000 mL | Freq: Four times a day (QID) | ORAL | Status: DC | PRN

## 2013-06-14 MED ORDER — IPRATROPIUM-ALBUTEROL 0.5-2.5 (3) MG/3ML IN SOLN
3.0000 mL | Freq: Four times a day (QID) | RESPIRATORY_TRACT | Status: DC
  Administered 2013-06-14 – 2013-06-15 (×4): 3 mL via RESPIRATORY_TRACT
  Filled 2013-06-14 (×4): qty 3

## 2013-06-14 MED ORDER — METHYLPREDNISOLONE SODIUM SUCC 40 MG IJ SOLR
40.0000 mg | INTRAMUSCULAR | Status: DC
  Filled 2013-06-14: qty 1

## 2013-06-14 MED ORDER — PNEUMOCOCCAL VAC POLYVALENT 25 MCG/0.5ML IJ INJ
0.5000 mL | INJECTION | INTRAMUSCULAR | Status: AC
  Administered 2013-06-15: 0.5 mL via INTRAMUSCULAR
  Filled 2013-06-14: qty 0.5

## 2013-06-14 MED ORDER — POTASSIUM CHLORIDE 20 MEQ/15ML (10%) PO LIQD
20.0000 meq | ORAL | Status: AC
  Administered 2013-06-14 (×2): 20 meq
  Filled 2013-06-14 (×2): qty 15

## 2013-06-14 MED ORDER — INFLUENZA VAC SPLIT QUAD 0.5 ML IM SUSP
0.5000 mL | INTRAMUSCULAR | Status: DC
  Filled 2013-06-14: qty 0.5

## 2013-06-14 NOTE — Progress Notes (Signed)
hypoHypoglycemic Event  CBG: 71  Treatment:  one amp D50  Symptoms: none  Follow-up CBG: Time:2025 CBG Result 124 Possible Reasons for Event: npo  Comments/MD notified:Dr. Daiva Huge  Remember to initiate Hypoglycemia Order Set & complete

## 2013-06-14 NOTE — Progress Notes (Signed)
NUTRITION FOLLOW UP  Intervention:   1.  Modify diet; resume PO diet once medically appropriate per MD discretion.  Nutrition Dx:   Inadequate oral intake r/t to inability to eat, ongoing.   Monitor:   1.  Food/Beverage; pt meeting >/=90% estimated needs with tolerance. 2.  Wt/wt change; monitor trends 3.  Enteral nutrition; initiation with tolerance.  Pt to meet >/=90% estimated needs with nutrition support. No longer appropriate, discontinue.    Assessment:   Pt admitted with cardiac arrest; found to have RCA occlusion. UDS +THC.  Pt has been extubated.  Currently NPO.  Needs estimate has been updated.  RD to follow for ongoing interventions as needed once POs resumed.   Height: Ht Readings from Last 1 Encounters:  06/12/13 5\' 6"  (1.676 m)    Weight Status:   Wt Readings from Last 1 Encounters:  06/14/13 145 lb 4.5 oz (65.9 kg)    Re-estimated needs:  Kcal: 1900-2100 Protein: 80-95g Fluid: ~2.0 L/day  Skin: intact  Diet Order:  NPO   Intake/Output Summary (Last 24 hours) at 06/14/13 1320 Last data filed at 06/14/13 1300  Gross per 24 hour  Intake 6265.8 ml  Output   3450 ml  Net 2815.8 ml    Last BM: 2/10  Labs:   Recent Labs Lab 06/12/13 0348  06/13/13 1632 06/13/13 2015 06/14/13 0430  NA 138  < > 141 140 145  K 4.4  < > 3.5* 4.0 3.5*  CL 103  < > 106 104 110  CO2 18*  < > 22 22 21   BUN 16  < > 5* 7 7  CREATININE 0.94  < > 0.56 0.64 0.76  CALCIUM 8.3*  < > 7.7* 8.2* 7.8*  MG 2.2  < > 2.1 2.1 2.1  PHOS 3.6  --   --   --   --   GLUCOSE 190*  < > 132* 67* 187*  < > = values in this interval not displayed.  CBG (last 3)   Recent Labs  06/14/13 0024 06/14/13 0418 06/14/13 0803  GLUCAP 133* 165* 152*    Scheduled Meds: . aspirin  81 mg Oral Daily  . atorvastatin  80 mg Oral q1800  . budesonide (PULMICORT) nebulizer solution  0.25 mg Nebulization BID  . doxycycline (VIBRAMYCIN) IV  100 mg Intravenous BID  . enoxaparin (LOVENOX)  injection  40 mg Subcutaneous Q24H  . [START ON 06/15/2013] influenza vac split quadrivalent PF  0.5 mL Intramuscular Tomorrow-1000  . insulin aspart  2-6 Units Subcutaneous 6 times per day  . ipratropium-albuterol  3 mL Nebulization Q6H  . [START ON 06/15/2013] methylPREDNISolone (SOLU-MEDROL) injection  40 mg Intravenous Q24H  . metoprolol  5 mg Intravenous 4 times per day  . pantoprazole (PROTONIX) IV  40 mg Intravenous QHS  . [START ON 06/15/2013] pneumococcal 23 valent vaccine  0.5 mL Intramuscular Tomorrow-1000  . Ticagrelor  90 mg Oral BID    Continuous Infusions: . sodium chloride 20 mL/hr at 06/14/13 0926  . sodium chloride 20 mL/hr at 06/14/13 0927  . sodium chloride 20 mL/hr at 06/14/13 0927  . norepinephrine (LEVOPHED) Adult infusion Stopped (06/14/13 0930)    Brynda Greathouse, MS RD LDN Clinical Inpatient Dietitian Pager: [phone] Weekend/After hours pager: [phone]

## 2013-06-14 NOTE — Procedures (Signed)
Extubation Procedure Note  Patient Details:   Name: Timothy [name] DOB: [birth year] MRN: [id number]   Airway Documentation:     Evaluation  O2 sats: stable throughout Complications: No apparent complications Patient did tolerate procedure well. Bilateral Breath Sounds:  (coarse) Suctioning: Airway Yes pt able to vocalize.   Pt extubated per MD order to 4L [place] and tolerating well. No complications. No stridor noted. Pt able to breathe around deflated cuff. VS stable. RT will continue to monitor.   Irineo Axon [facility] 06/14/2013, 9:26 AM

## 2013-06-14 NOTE — Progress Notes (Signed)
PT Cancellation Note  Patient Details Name: Timothy [name] MRN: [id number] DOB: May 19, 1955   Cancelled Treatment:    Reason Eval/Treat Not Completed: Fatigue/lethargy limiting ability to participate (Spoke with nsg, will hold PT today, Evaluate in am for OOB.)   Duncan Dull 06/14/2013, 3:04 PM Alben Deeds, [place] DPT  [phone]

## 2013-06-14 NOTE — Progress Notes (Signed)
Name: Timothy [name] MRN: [id number] DOB: 04-Dec-1955    ADMISSION DATE:  06/12/2013  REFERRING MD :  ED PRIMARY SERVICE: PCCM  CHIEF COMPLAINT:  Cardiac arrest (VT)  BRIEF PATIENT DESCRIPTION: 58 y.o ex smoker, (quit x 3y) found unresponsive, pulseless sp CPR x 2 minutes, shock x 3 for VT. Intubated. Hypothermia protocol. Recent bronchitic illness x 2 wks   SIGNIFICANT EVENTS / STUDIES:  2/9 Hypothermia protocol 2/9 totally occluded RCA with PTCA/ DES 2/9 CT portable head: no acute intracranial abnormality, moderate inflammatory maxillary and ethmoidal sinus disease 2/10 Hypotensive requiring pressors overnight, start warming today  LINES / TUBES: ET tube 06/12/13 >>>2/11 Foley 06/12/2013 >>> RIJ 06/12/2013>>>  CULTURES: Tracheal aspirate 2/9>>>oral flora  ANTIBIOTICS: Doxycycline 2/9>>>  SUBJECTIVE: awake, agitated, tachycardic this am Follows some commands  VITAL SIGNS: Temp:  [93.2 F (34 C)-100.5 F (38.1 C)] 97 F (36.1 C) (02/11 0800) Pulse Rate:  [77-165] 141 (02/11 0920) Resp:  [6-25] 15 (02/11 0920) BP: (67-148)/(39-113) 145/93 mmHg (02/11 0900) SpO2:  [94 %-99 %] 97 % (02/11 0920) FiO2 (%):  [40 %] 40 % (02/11 0900) Weight:  [65.9 kg (145 lb 4.5 oz)] 65.9 kg (145 lb 4.5 oz) (02/11 0425) HEMODYNAMICS: CVP:  [2 mmHg-9 mmHg] 3 mmHg VENTILATOR SETTINGS: Vent Mode:  [-] PSV;CPAP FiO2 (%):  [40 %] 40 % Set Rate:  [20 bmp] 20 bmp Vt Set:  [530 mL] 530 mL PEEP:  [5 cmH20] 5 cmH20 Pressure Support:  [5 cmH20] 5 cmH20 Plateau Pressure:  [15 cmH20-17 cmH20] 16 cmH20 INTAKE / OUTPUT: Intake/Output     02/10 0701 - 02/11 0700 02/11 0701 - 02/12 0700   I.V. (mL/kg) 4369.8 (66.3)    NG/GT 170    IV Piggyback 1683.3    Total Intake(mL/kg) 6223.2 (94.4)    Urine (mL/kg/hr) 2780 (1.8) 500 (3)   Emesis/NG output 350 (0.2)    Total Output 3130 500   Net +3093.2 -500          PHYSICAL EXAMINATION: General:  Intubated, sedated Neuro: int agitated then drifts off  to sleep, non focal HEENT:  Intubated, pupils 30mmRTL Cardiovascular:  RRR Lungs:  Coarse b/l breath sounds Abdomen:  Soft Musculoskeletal:  -edema Skin:  pale  LABS:  CBC  Recent Labs Lab 06/12/13 0318  06/12/13 0638 06/12/13 0809 06/12/13 1002 06/13/13 0400  WBC 19.6*  --  18.0*  --   --  15.1*  HGB 15.0  < > 14.5 15.6 15.3 15.5  HCT 43.4  < > 42.3 46.0 45.0 44.5  PLT 289  --  267  --   --  267  < > = values in this interval not displayed. Coag's  Recent Labs Lab 06/12/13 0638 06/12/13 1600  APTT 93* 43*  INR 1.84* 1.32   BMET  Recent Labs Lab 06/13/13 1632 06/13/13 2015 06/14/13 0430  NA 141 140 145  K 3.5* 4.0 3.5*  CL 106 104 110  CO2 22 22 21   BUN 5* 7 7  CREATININE 0.56 0.64 0.76  GLUCOSE 132* 67* 187*   Electrolytes  Recent Labs Lab 06/12/13 0348  06/13/13 1632 06/13/13 2015 06/14/13 0430  CALCIUM 8.3*  < > 7.7* 8.2* 7.8*  MG 2.2  < > 2.1 2.1 2.1  PHOS 3.6  --   --   --   --   < > = values in this interval not displayed. Sepsis Markers  Recent Labs Lab 06/12/13 0415  LATICACIDVEN 3.35*   ABG  Recent Labs Lab 06/12/13 0332 06/12/13 0627  PHART 7.291* 7.319*  PCO2ART 51.3* 41.0  PO2ART 259.0* 249.0*   Liver Enzymes  Recent Labs Lab 06/12/13 0348 06/12/13 0638  AST 59* 53*  ALT 38 34  ALKPHOS 89 90  BILITOT 0.3 0.4  ALBUMIN 3.2* 3.2*   Cardiac Enzymes  Recent Labs Lab 06/12/13 1147 06/12/13 1600 06/12/13 2200  TROPONINI 5.76* 4.42* 2.94*   Glucose  Recent Labs Lab 06/13/13 1953 06/13/13 2028 06/13/13 2238 06/14/13 0024 06/14/13 0418 06/14/13 0803  GLUCAP 71 124* 66* 133* 165* 152*   Imaging Dg Chest Port 1 View  06/13/2013   CLINICAL DATA:  Hypoxia  EXAM: PORTABLE CHEST - 1 VIEW  COMPARISON:  June 12, 2013  FINDINGS: Endotracheal tube tip is 4.8 cm above the carina. Central catheter tip is in the superior vena cava. Nasogastric tube tip and side port are below the diaphragm. No pneumothorax. Patchy  interstitial edema remains. There is no new opacity. The heart size and pulmonary vascularity are normal.  IMPRESSION: Tube and catheter positions as described without pneumothorax. Stable interstitial edema. No new opacity.   Electronically Signed   By: Lowella Grip M.D.   On: 06/13/2013 07:05   Dg Chest Port 1 View  06/12/2013   CLINICAL DATA:  Line placement  EXAM: PORTABLE CHEST - 1 VIEW  COMPARISON:  DG CHEST 1V PORT dated 06/12/2013  FINDINGS: There is a right jugular central venous catheter with the tip projecting over the SVC. There is a nasogastric tube coursing below the diaphragm. There is an endotracheal tube in satisfactory position. There is bilateral mild interstitial thickening. There is no pleural effusion, focal parenchymal opacity or pneumothorax. The heart and mediastinal contours are unremarkable.  The osseous structures are unremarkable.  IMPRESSION: Right jugular central venous catheter with the tip projecting over the SVC.   Electronically Signed   By: Kathreen Devoid   On: 06/12/2013 12:07   Ct Portable Head W/o Cm  06/12/2013   CLINICAL DATA:  Cardiac arrest  EXAM: CT HEAD WITHOUT CONTRAST  TECHNIQUE: Contiguous axial images were obtained from the base of the skull through the vertex without intravenous contrast.  COMPARISON:  None available  FINDINGS: There is no acute intracranial hemorrhage or infarct. No mass lesion or midline shift. Gray-white matter differentiation is well maintained. No evidence of significant cerebral edema or anoxia at this time. Ventricles are normal in size without evidence of hydrocephalus. CSF containing spaces are within normal limits. Basilar cisterns are widely patent. No extra-axial fluid collection.  The calvarium is intact.  Orbital soft tissues are within normal limits.  Endotracheal and enteric tubes are partially visualized. Moderate mucoperiosteal thickening present within the maxillary sinuses and ethmoidal air cells bilaterally. No mastoid  effusion.  Scalp soft tissues are unremarkable.  IMPRESSION: 1. No acute intracranial abnormality identified. No evidence of significant cerebral anoxia or edema at this time. 2. Moderate inflammatory maxillary and ethmoidal sinus disease.   Electronically Signed   By: Jeannine Boga M.D.   On: 06/12/2013 12:38   CXR:  ETT  stable interstitial edema  ASSESSMENT / PLAN:  PULMONARY A: Acute respiratory failure due to cardiac arrest COPD exacerbation Chronic bronchitis P:   Duoneb and pulmicort inhaled scheduled.  Extubate  decrease Solumedrol 40 qd- PO in 24h Continue doxycycline x 7ds   CARDIOVASCULAR A: Cardiac arrest due to VT IWMI - s/p DES to RCA Hypotension--on Levo P: Taper levo to off  Per cardiology, Cardio on board  for further management (for statin, heparin, second antiplatelet, ACE, BB, etc)  RENAL A: Hypokalemia, K 3.1 and Mag 1.4 Decreased urine output, foley in place (30cc since 6am) P:   Replaced electrolytes continue to monitor  GASTROINTESTINAL A:  No acute issue P:   PPI prophylaxis Advance PO when able  HEMATOLOGIC A:  CBC pending P:  monitor  INFECTIOUS A:  No acute evidence of infection, leukocytosis trending down P:   doxy for COPD exacerbation  ENDOCRINE A: Hyperglycemia - ? Steroids, HbA1C 5.9 P:   SSI  NEUROLOGIC A:  Altered mental status s/p cardiac arrest CT head negative for acute findings P:   Dc sedation, off paralytic   TODAY'S SUMMARY: Extubate, once off pressors, can add BB  I have personally obtained a history, examined the patient, evaluated laboratory and imaging results, formulated the assessment and plan and placed orders. CRITICAL CARE: The patient is critically ill with multiple organ systems failure and requires high complexity decision making for assessment and support, frequent evaluation and titration of therapies, application of advanced monitoring technologies and extensive interpretation of multiple  databases. Critical Care Time devoted to patient care services described in this note is 45 minutes.    Rigoberto Noel  Pulmonary and [place] Pager: [phone]  06/14/2013, 9:32 AM

## 2013-06-14 NOTE — Progress Notes (Signed)
Subjective: Pt was warmed overnight and became agitated therefore sedation was increased. Still some hypotension and restarted levo.    Pt sedated and intubated this AM. Tele ST.  Objective: Vital signs in last 24 hours: Filed Vitals:   06/14/13 0425 06/14/13 0430 06/14/13 0442 06/14/13 0600  BP:  111/53  111/56  Pulse:  99 96 92  Temp:      TempSrc:      Resp:  20 20 20   Height:      Weight: 145 lb 4.5 oz (65.9 kg)     SpO2:  96% 96% 94%   Weight change: 3 lb 8.4 oz (1.6 kg)  Intake/Output Summary (Last 24 hours) at 06/14/13 0717 Last data filed at 06/14/13 0600  Gross per 24 hour  Intake 6223.17 ml  Output   2805 ml  Net 3418.17 ml   General: sedated, RASS -1 HEENT: PERRL, no scleral icterus Cardiac: RRR, no rubs, murmurs or gallops Pulm: expiratory crackles, on vent Abd: soft, nondistended, BS present Ext: warm and well perfused, no pedal edema  Lab Results: Basic Metabolic Panel:  Recent Labs Lab 06/12/13 0348  06/13/13 2015 06/14/13 0430  NA 138  < > 140 145  K 4.4  < > 4.0 3.5*  CL 103  < > 104 110  CO2 18*  < > 22 21  GLUCOSE 190*  < > 67* 187*  BUN 16  < > 7 7  CREATININE 0.94  < > 0.64 0.76  CALCIUM 8.3*  < > 8.2* 7.8*  MG 2.2  < > 2.1 2.1  PHOS 3.6  --   --   --   < > = values in this interval not displayed. CBC:  Recent Labs Lab 06/12/13 0411 06/12/13 0638  06/12/13 1002 06/13/13 0400  WBC  --  18.0*  --   --  15.1*  NEUTROABS  --  14.0*  --   --   --   HGB 16.0 14.5  < > 15.3 15.5  HCT 47.0 42.3  < > 45.0 44.5  MCV  --  89.6  --   --  88.8  PLT  --  267  --   --  267  < > = values in this interval not displayed. CBG:  Recent Labs Lab 06/13/13 1605 06/13/13 1953 06/13/13 2028 06/13/13 2238 06/14/13 0024 06/14/13 0418  GLUCAP 113* 71 124* 66* 133* 165*   Cardiac studies: 06/12/13 LHC: EF 50-55%, mild inferior hypocontractility w/o MR, mid RCA total occlusion s/p Xience 2.5x67mm DES.  Medications: I have reviewed the patient's  current medications. Scheduled Meds: . antiseptic oral rinse  15 mL Mouth Rinse QID  . aspirin  81 mg Oral Daily  . atorvastatin  80 mg Oral q1800  . budesonide (PULMICORT) nebulizer solution  0.25 mg Nebulization BID  . chlorhexidine  15 mL Mouth Rinse BID  . doxycycline (VIBRAMYCIN) IV  100 mg Intravenous BID  . enoxaparin (LOVENOX) injection  40 mg Subcutaneous Q24H  . influenza vac split quadrivalent PF  0.5 mL Intramuscular Tomorrow-1000  . insulin aspart  2-6 Units Subcutaneous 6 times per day  . ipratropium-albuterol  3 mL Nebulization Q4H  . methylPREDNISolone (SOLU-MEDROL) injection  40 mg Intravenous Q12H  . metoprolol  5 mg Intravenous 4 times per day  . pantoprazole (PROTONIX) IV  40 mg Intravenous QHS  . pneumococcal 23 valent vaccine  0.5 mL Intramuscular Tomorrow-1000  . potassium chloride  20 mEq Per Tube Q4H  . Ticagrelor  90 mg Oral BID   Continuous Infusions: . sodium chloride 1,000 mL (06/13/13 0000)  . sodium chloride 1,000 mL (06/12/13 2356)  . sodium chloride 10 mL/hr at 06/12/13 1022  . dextrose 500 mL (06/13/13 2254)  . fentaNYL infusion INTRAVENOUS 150 mcg/hr (06/14/13 0145)  . midazolam (VERSED) infusion Stopped (06/13/13 1820)  . norepinephrine (LEVOPHED) Adult infusion 15 mcg/min (06/14/13 5537)  . propofol 50 mcg/kg/min (06/14/13 0628)   PRN Meds:.sodium chloride, acetaminophen, albuterol, ondansetron (ZOFRAN) IV Assessment/Plan: Principal Problem:   Cardiac arrest Active Problems:   VT (ventricular tachycardia)   COPD exacerbation   NSTEMI (non-ST elevated myocardial infarction)  1. VT arrest s/p PCI DES to mid RCA: Pt presented at home in VF/VT arrest had ROSC after 2 shocks, CPR and amio load then EKG with lateral wall inversions. Pt emerg[facility] to [facility] on 06[facility]12[facility]13 found to have total mid RCA occlusion s/p 2.5x46mm DES. Hypothermia protocol initiated. UDS +THC, HgbA1c 5.9, LDL 77. No repeat arrhythmias or events on repeat EKGs.  -cont  brilinta, ASA, metoprolol, statin -may need to increase BB if continues to be agitated and tachycardiac -will continue to asses neurological function   LOS: 2 days   Clinton Gallant, MD 06/14/2013, 7:17 AM  Patient seen, examined. Available data reviewed. Agree with findings, assessment, and plan as outlined by Dr Algis Liming. Exam reveals agitated male, lungs coarse, heart tachy and regular, no peripheral edema. He is not oriented but was just extubated. Plan increase beta-blocker, wean off Norepi, continue current cardiac therapies (ASA/brilinta/statin). Will need PT/OT, etc. Follow neuro progress.   Sherren Mocha, M.D. 06/14/2013 11:18 AM

## 2013-06-14 NOTE — Progress Notes (Signed)
[facility] ADULT ICU REPLACEMENT PROTOCOL FOR AM LAB REPLACEMENT ONLY  The patient does apply for the [facility] Adult ICU Electrolyte Replacment Protocol based on the criteria listed below:   1. Is GFR >/= 40 ml/min? yes  Patient's GFR today is >90 2. Is urine output >/= 0.5 ml/kg/hr for the last 6 hours? yes Patient's UOP is 1.0 ml/kg/hr 3. Is BUN < 60 mg/dL? yes  Patient's BUN today is 7 4. Abnormal electrolyte(s): K 3.5 5. Ordered repletion with: per protocol 6. If a panic level lab has been reported, has the CCM MD in charge been notified? no.   Physician:    Ronda Fairly A 06/14/2013 5:44 AM

## 2013-06-14 NOTE — Progress Notes (Addendum)
Hypoglycemic Event  CBG: 66  Treatment: D10 gtt  Symptoms: none  Follow-up CBG: Time:0024 CBG Result 133  Possible Reasons for Event: npo  Comments/MD notified:Dr. Daiva Huge  Remember to initiate Hypoglycemia Order Set & complete

## 2013-06-14 NOTE — Progress Notes (Signed)
Wasted 50 cc Fentanyl drip in sink with second nurse witness Pat Kocher.

## 2013-06-15 LAB — BASIC METABOLIC PANEL
BUN: 13 mg/dL (ref 6–23)
CHLORIDE: 105 meq/L (ref 96–112)
CO2: 24 mEq/L (ref 19–32)
Calcium: 8.6 mg/dL (ref 8.4–10.5)
Creatinine, Ser: 0.72 mg/dL (ref 0.50–1.35)
GFR calc Af Amer: >90 mL/min (ref >90)
GFR calc non Af Amer: >90 mL/min (ref >90)
GLUCOSE: 92 mg/dL (ref 70–99)
POTASSIUM: 4.2 meq/L (ref 3.7–5.3)
Sodium: 142 mEq/L (ref 137–147)

## 2013-06-15 LAB — GLUCOSE, CAPILLARY
Glucose-Capillary: 86 mg/dL (ref 70–99)
Glucose-Capillary: 88 mg/dL (ref 70–99)
Glucose-Capillary: 97 mg/dL (ref 70–99)

## 2013-06-15 MED ORDER — DOXYCYCLINE HYCLATE 100 MG PO TABS
100.0000 mg | ORAL_TABLET | Freq: Two times a day (BID) | ORAL | Status: DC
  Administered 2013-06-15 – 2013-06-17 (×5): 100 mg via ORAL
  Filled 2013-06-15 (×8): qty 1

## 2013-06-15 MED ORDER — BUDESONIDE 0.5 MG/2ML IN SUSP
0.5000 mg | Freq: Two times a day (BID) | RESPIRATORY_TRACT | Status: DC
  Administered 2013-06-15 – 2013-06-19 (×8): 0.5 mg via RESPIRATORY_TRACT
  Filled 2013-06-15 (×10): qty 2

## 2013-06-15 MED ORDER — IPRATROPIUM-ALBUTEROL 0.5-2.5 (3) MG/3ML IN SOLN
3.0000 mL | Freq: Four times a day (QID) | RESPIRATORY_TRACT | Status: DC
  Administered 2013-06-15 – 2013-06-19 (×17): 3 mL via RESPIRATORY_TRACT
  Filled 2013-06-15 (×17): qty 3

## 2013-06-15 MED ORDER — METOPROLOL TARTRATE 25 MG PO TABS
25.0000 mg | ORAL_TABLET | Freq: Two times a day (BID) | ORAL | Status: DC
  Administered 2013-06-15 (×2): 25 mg via ORAL
  Filled 2013-06-15 (×4): qty 1

## 2013-06-15 MED ORDER — ALBUTEROL SULFATE (2.5 MG/3ML) 0.083% IN NEBU: 2.5000 mg | INHALATION_SOLUTION | RESPIRATORY_TRACT | Status: DC | PRN

## 2013-06-15 NOTE — Evaluation (Signed)
Physical Therapy Evaluation Patient Details Name: Timothy [name] MRN: [id number] DOB: February 15, 1956 Today's Date: 06/15/2013 Time: 1324-4010 PT Time Calculation (min): 22 min  PT Assessment / Plan / Recommendation History of Present Illness  58 y.o ex smoker, (quit x 3y) found unresponsive, pulseless sp CPR x 2 minutes, shock x 3 for VT. Intubated. Hypothermia protocol.ETT 2/9- 2/11  Clinical Impression  Pt very pleasant and moving well despite bed rest for 3 days. Pt unaware of time and flat affect throughout. Pt with below deficits and decreased caregiver available and will benefit from acute therapy to maximize mobility, function, safety and independence to decrease burden of care and return pt to PLOF. Recommend daily mobility with nursing and encouraged bil LE HEP. Pt would benefit from CIR to reach Mod I level prior to return home.    PT Assessment  Patient needs continued PT services    Follow Up Recommendations  CIR;Supervision/Assistance - 24 hour    Does the patient have the potential to tolerate intense rehabilitation      Barriers to Discharge Decreased caregiver support      Equipment Recommendations  Rolling walker with 5" wheels    Recommendations for Other Services OT consult   Frequency Min 3X/week    Precautions / Restrictions Precautions Precautions: Fall   Pertinent Vitals/Pain No pain sats dropping to 89% on RA with sats 93% on 4L with activity HR 100-122 BP before 158/78, 145/82 after     Mobility  Bed Mobility Overal bed mobility: Needs Assistance Bed Mobility: Supine to Sit Supine to sit: Min assist;HOB elevated General bed mobility comments: cueing for sequence with assist to fully rotate to EOB and scoot with HOB elevated Transfers Overall transfer level: Needs assistance Equipment used: None Transfers: Sit to/from Stand Sit to Stand: Min assist General transfer comment: cueing and assist for anterior translation and hand  placement Ambulation/Gait Ambulation/Gait assistance: Min assist;+2 safety/equipment (with chair followed) Ambulation Distance (Feet): 60 Feet Assistive device: Rolling walker (2 wheeled) Gait Pattern/deviations: Step-through pattern;Decreased stride length Gait velocity interpretation: <1.8 ft/sec, indicative of risk for recurrent falls General Gait Details: cueing for posture and position in RW    Exercises General Exercises - Lower Extremity Long Arc Quad: AROM;Seated;Both;5 reps Hip Flexion/Marching: AROM;Seated;Both;5 reps   PT Diagnosis: Difficulty walking;Generalized weakness  PT Problem List: Decreased strength;Decreased cognition;Decreased activity tolerance;Decreased mobility;Decreased knowledge of use of DME;Cardiopulmonary status limiting activity PT Treatment Interventions: Gait training;DME instruction;Functional mobility training;Therapeutic activities;Therapeutic exercise;Patient/family education;Cognitive remediation     PT Goals(Current goals can be found in the care plan section) Acute Rehab PT Goals Patient Stated Goal: return to work PT Goal Formulation: With patient Time For Goal Achievement: 06/29/13 Potential to Achieve Goals: Good  Visit Information  Last PT Received On: 06/15/13 Assistance Needed: +1 History of Present Illness: 58 y.o ex smoker, (quit x 3y) found unresponsive, pulseless sp CPR x 2 minutes, shock x 3 for VT. Intubated. Hypothermia protocol.ETT 2/9- 2/11       [place] expects to be discharged to:: Private residence Living Arrangements: Spouse/significant other Available Help at Discharge: Family;Available PRN/intermittently Type of Home: House Home Access: Level entry Home Layout: One level Home Equipment: None Prior Function Level of Independence: Independent Comments: pt works full time as a Architectural technologist: No difficulties    Solicitor Arousal/Alertness:  Awake/alert Behavior During Therapy: Flat affect Overall Cognitive Status: Impaired/Different from baseline Area of Impairment: Orientation;Memory;Safety/judgement Orientation Level: Time Memory: Decreased short-term memory Safety/Judgement: Decreased awareness  of deficits;Decreased awareness of safety    Extremity/Trunk Assessment Upper Extremity Assessment Upper Extremity Assessment: Generalized weakness Lower Extremity Assessment Lower Extremity Assessment: Generalized weakness Cervical / Trunk Assessment Cervical / Trunk Assessment: Normal   Balance    End of Session PT - End of Session Equipment Utilized During Treatment: Gait belt;Oxygen Activity Tolerance: Patient tolerated treatment well Patient left: in chair;with call bell/phone within reach;with nursing/sitter in room Nurse Communication: Mobility status  GP     Lanetta Inch [facility] 06/15/2013, [place], [place]

## 2013-06-15 NOTE — Progress Notes (Signed)
Subjective: Pt is doing well this AM. Complaining of some pleuritic CP. No SOB and no other pain. Pt is alert and oriented but feeling weak. Doesn't have much memory of in hospital events.   Pt states no known family hx of CAD or MI. Pt had only been feeling some episodes of SOB before coming into the hospital but no DOE or CP. He was a 2ppd x 38 years for a 76 pack year hx with smoking cessation about 5 years ago. He works as a Dealer.   Objective: Vital signs in last 24 hours: Filed Vitals:   06/15/13 0525 06/15/13 0600 06/15/13 0615 06/15/13 0700  BP:  134/81  116/67  Pulse: 104 102 124 89  Temp:      TempSrc:      Resp: 17 16 22 11   Height:      Weight:      SpO2: 92% 93% 89% 96%   Weight change: -10 lb 9.3 oz (-4.8 kg)  Intake/Output Summary (Last 24 hours) at 06/15/13 0737 Last data filed at 06/15/13 0600  Gross per 24 hour  Intake 2068.43 ml  Output   3075 ml  Net -1006.57 ml   General: sitting up in bed, NAD  HEENT: PERRL, no scleral icterus Cardiac: RRR, no rubs, murmurs or gallops Pulm: expiratory crackles, some decreased BS in bases Abd: soft, nondistended, BS present Ext: warm and well perfused, no pedal edema  Lab Results: Basic Metabolic Panel:  Recent Labs Lab 06/12/13 0348  06/13/13 2015 06/14/13 0430 06/15/13 0510  NA 138  < > 140 145 142  K 4.4  < > 4.0 3.5* 4.2  CL 103  < > 104 110 105  CO2 18*  < > 22 21 24   GLUCOSE 190*  < > 67* 187* 92  BUN 16  < > 7 7 13   CREATININE 0.94  < > 0.64 0.76 0.72  CALCIUM 8.3*  < > 8.2* 7.8* 8.6  MG 2.2  < > 2.1 2.1  --   PHOS 3.6  --   --   --   --   < > = values in this interval not displayed. CBC:  Recent Labs Lab 06/12/13 0411 06/12/13 0638  06/12/13 1002 06/13/13 0400  WBC  --  18.0*  --   --  15.1*  NEUTROABS  --  14.0*  --   --   --   HGB 16.0 14.5  < > 15.3 15.5  HCT 47.0 42.3  < > 45.0 44.5  MCV  --  89.6  --   --  88.8  PLT  --  267  --   --  267  < > = values in this interval not  displayed. CBG:  Recent Labs Lab 06/14/13 0024 06/14/13 0418 06/14/13 0803 06/14/13 1200 06/14/13 1613 06/14/13 1928  GLUCAP 133* 165* 152* 113* 109* 100*   Cardiac studies: 06/12/13 LHC: EF 50-55%, mild inferior hypocontractility w/o MR, mid RCA total occlusion s/p Xience 2.5x29mm DES.  Medications: I have reviewed the patient's current medications. Scheduled Meds: . aspirin  81 mg Oral Daily  . atorvastatin  80 mg Oral q1800  . budesonide (PULMICORT) nebulizer solution  0.25 mg Nebulization BID  . doxycycline (VIBRAMYCIN) IV  100 mg Intravenous BID  . enoxaparin (LOVENOX) injection  40 mg Subcutaneous Q24H  . influenza vac split quadrivalent PF  0.5 mL Intramuscular Tomorrow-1000  . insulin aspart  2-6 Units Subcutaneous 6 times per day  . ipratropium-albuterol  3 mL Nebulization Q6H  . methylPREDNISolone (SOLU-MEDROL) injection  40 mg Intravenous Q24H  . metoprolol tartrate  25 mg Oral BID  . pantoprazole (PROTONIX) IV  40 mg Intravenous QHS  . pneumococcal 23 valent vaccine  0.5 mL Intramuscular Tomorrow-1000  . Ticagrelor  90 mg Oral BID   Continuous Infusions: . sodium chloride 20 mL/hr at 06/14/13 0926  . norepinephrine (LEVOPHED) Adult infusion Stopped (06/14/13 0930)   PRN Meds:.sodium chloride, acetaminophen, albuterol, alum & mag hydroxide-simeth, ondansetron (ZOFRAN) IV Assessment/Plan: Principal Problem:   Cardiac arrest Active Problems:   VT (ventricular tachycardia)   COPD exacerbation   NSTEMI (non-ST elevated myocardial infarction)  1. VT arrest s/p PCI DES to mid RCA: Pt presented at home in VF/VT arrest had ROSC after 2 shocks, CPR and amio load then EKG with lateral wall inversions. Pt emergently taken to [facility] on 06/12/13 found to have total mid RCA occlusion s/p 2.5x33mm DES. Hypothermia protocol initiated. UDS +THC, HgbA1c 5.9, LDL 77. No repeat arrhythmias or events on repeat EKGs.  -cont brilinta, ASA, metoprolol 25mg  BID, statin -may need to increase  BB if continues to be tachycardiac -will continue to asses neurological function -ambulate to a chair -repeat CXR -maybe able to transfer to SDU after PCCM evaluation   LOS: 3 days   Clinton Gallant, MD 06/15/2013, 7:37 AM  Patient seen, examined. Available data reviewed. Agree with findings, assessment, and plan as outlined by Dr Algis Liming. Exam reveals and alert male in NAD. Able to discuss his profession, where he lives, etc. Lungs with scattered rhonchi, heart regular and tachy, no peripheral edema. Cardiac stable. Will slowly titrate up his beta-blocker as tolerated. Continue ASA/brilinta. He is receiving inhaled therapies and solumedrol, doxy per CCM team.   Sherren Mocha, M.D. 06/15/2013 10:11 AM

## 2013-06-15 NOTE — Progress Notes (Signed)
    Name: Timothy [name] MRN: [id number] DOB: 1955/05/27    ADMISSION DATE:  06/12/2013  REFERRING MD :  ED PRIMARY SERVICE: Cards  CHIEF COMPLAINT:  Cardiac arrest (VT)  BRIEF PATIENT DESCRIPTION:  58 y.o ex smoker, (quit x 3y) found unresponsive, pulseless sp CPR x 2 minutes, shock x 3 for VT. Intubated. Hypothermia protocol. Recent bronchitic illness x 2 wks   SIGNIFICANT EVENTS / STUDIES:  2/9 Hypothermia protocol 2/9 totally occluded RCA with PTCA/ DES 2/9 CT portable head: no acute intracranial abnormality, moderate inflammatory maxillary and ethmoidal sinus disease 2/10 Hypotensive requiring pressors overnight, start warming today  LINES / TUBES: ET tube 06/12/13 >>>2/11 Foley 06/12/2013 >> 2/12 RIJ 06/12/2013>> 2/12  CULTURES: Tracheal aspirate 2/9>>>oral flora  ANTIBIOTICS: Doxycycline 2/9>>> 2/14 (stop date ordered)  SUBJECTIVE:  NAD, no new complaints. Oriented but somewhat delayed in speech  VITAL SIGNS: Temp:  [98 F (36.7 C)-98.8 F (37.1 C)] 98.4 F (36.9 C) (02/12 1448) Pulse Rate:  [88-124] 94 (02/12 1400) Resp:  [9-22] 18 (02/12 1400) BP: (112-147)/(65-93) 134/81 mmHg (02/12 1400) SpO2:  [89 %-98 %] 98 % (02/12 1535) Weight:  [61.1 kg (134 lb 11.2 oz)] 61.1 kg (134 lb 11.2 oz) (02/12 0500) HEMODYNAMICS:   VENTILATOR SETTINGS:   INTAKE / OUTPUT: Intake/Output     02/11 0701 - 02/12 0700 02/12 0701 - 02/13 0700   I.V. (mL/kg) 1468.4 (24) 140 (2.3)   NG/GT 120    IV Piggyback 500    Total Intake(mL/kg) 2088.4 (34.2) 140 (2.3)   Urine (mL/kg/hr) 3075 (2.1) 1075 (2)   Emesis/NG output     Total Output 3075 1075   Net -986.6 -935        Emesis Occurrence 3 x      PHYSICAL EXAMINATION: General: NAD Neuro: slow responses, no focal deficits HEENT: WNL Cardiovascular:  RRR Lungs: scattered B wheezes Abdomen:  Soft, NT, NABS Ext: warm, no edema   LABS:  I have reviewed all of today's lab results. Relevant abnormalities are discussed in the A/P  section   CXR: NNF   ASSESSMENT / PLAN:  PULMONARY A: Acute respiratory failure due to cardiac arrest, resolved Mild COPD exacerbation History c/w chronic bronchitis P:   Bronchodilators adjusted Cont nebulized steroids Discharge meds should be tiotropium (Spiriva) - one inhalation daily and PRN albuterol MDI Complete 5 days doxycycline. Changed to PO 2/12 with stop date ordered   CARDIOVASCULAR A: Cardiac arrest due to VT IWMI - s/p DES to RCA Hypotension, resolved P: Further eval and mgmt per Cards  RENAL A: Hypokalemia, resolved P:   Monitor BMET intermittently Monitor I/Os Correct electrolytes as indicated   GASTROINTESTINAL A:  No acute issue P:   PPI prophylaxis N/I post extubation Advance diet   HEMATOLOGIC A:  No issues P:  monitor  INFECTIOUS A:  No issues P:   Micro and abx as above  ENDOCRINE A: Hyperglycemia, stress and steroid induced, resolved P:   DC methylpred DC SSI  NEUROLOGIC A:   Mild post anoxic encephalopathy, improving P:   Minimize sedatives Consider neurocognitive testing if doesn't return to baseline in next couple of days   PCCM will sign off. Please call if we can be of further assistance  Merton Border, MD ; [facility] [phone].  After 5:30 PM or weekends, call [phone]

## 2013-06-15 NOTE — Progress Notes (Signed)
Rehab Admissions Coordinator Note:  Patient was screened by Timothy [name] L for appropriateness for an Inpatient Acute Rehab Consult.  At this time, we are recommending Inpatient Rehab consult.  [name], PT Rehabilitation Admissions Coordinator [phone]  

## 2013-06-16 ENCOUNTER — Inpatient Hospital Stay (HOSPITAL_COMMUNITY): Payer: Medicaid Other

## 2013-06-16 DIAGNOSIS — G931 Anoxic brain damage, not elsewhere classified: Secondary | ICD-10-CM

## 2013-06-16 MED ORDER — ENSURE COMPLETE PO LIQD
237.0000 mL | Freq: Two times a day (BID) | ORAL | Status: DC
  Administered 2013-06-16 – 2013-06-19 (×4): 237 mL via ORAL

## 2013-06-16 MED ORDER — LISINOPRIL 2.5 MG PO TABS
2.5000 mg | ORAL_TABLET | Freq: Every day | ORAL | Status: DC
  Administered 2013-06-16 – 2013-06-19 (×4): 2.5 mg via ORAL
  Filled 2013-06-16 (×4): qty 1

## 2013-06-16 MED ORDER — METOPROLOL TARTRATE 50 MG PO TABS
50.0000 mg | ORAL_TABLET | Freq: Two times a day (BID) | ORAL | Status: DC
  Administered 2013-06-16 – 2013-06-17 (×3): 50 mg via ORAL
  Filled 2013-06-16 (×4): qty 1

## 2013-06-16 NOTE — Progress Notes (Signed)
Physical Therapy Treatment Patient Details Name: Timothy [name] MRN: [id number] DOB: [birth year] Today's Date: 06/16/2013 Time: 9381-0175 PT Time Calculation (min): 27 min  PT Assessment / Plan / Recommendation  History of Present Illness 58 y.o ex smoker, (quit x 3y) found unresponsive, pulseless sp CPR x 2 minutes, shock x 3 for VT. Intubated. Hypothermia protocol.ETT 2/9- 2/11   PT Comments   Pt improving with activity tolerance and gait but continues to demonstrate decreased cognition, balance and activity tolerance. Pt with decreased awareness of deficits despite education trying to ambulate without RW with LOB. Pt high fall risk on Berg assessment and discussed all with pt and girlfriend [name] who states that short term 24hr assist could be arranged. Will continue to follow. Pt with sats 87% on 2L with gait and even on 4L remained 89% despite standing rest breaks and cues for pursed lip breathing, pt without dyspnea.  Follow Up Recommendations  CIR;Supervision/Assistance - 24 hour     Does the patient have the potential to tolerate intense rehabilitation     Barriers to Discharge        Equipment Recommendations       Recommendations for Other Services    Frequency     Progress towards PT Goals Progress towards PT goals: Progressing toward goals  Plan Current plan remains appropriate    Precautions / Restrictions Precautions Precautions: Fall   Pertinent Vitals/Pain No pain    Mobility  Transfers Transfers: Stand Pivot Transfers Sit to Stand: Supervision Stand pivot transfers: Supervision General transfer comment: supervision for safety Ambulation/Gait Ambulation/Gait assistance: Min assist Ambulation Distance (Feet): 250 Feet Assistive device: Rolling walker (2 wheeled) Gait Pattern/deviations: Step-through pattern;Decreased stride length Gait velocity interpretation: <1.8 ft/sec, indicative of risk for recurrent falls General Gait Details: cueing for posture and  position in RW 2 LOB during gait with assist to correct    Exercises     PT Diagnosis:    PT Problem List:   PT Treatment Interventions:     PT Goals (current goals can now be found in the care plan section)    Visit Information  Last PT Received On: 06/16/13 Assistance Needed: +1 History of Present Illness: 58 y.o ex smoker, (quit x 3y) found unresponsive, pulseless sp CPR x 2 minutes, shock x 3 for VT. Intubated. Hypothermia protocol.ETT 2/9- 2/11    Subjective Data      Cognition  Cognition Arousal/Alertness: Awake/alert Behavior During Therapy: Flat affect Overall Cognitive Status: Impaired/Different from baseline Area of Impairment: Orientation;Safety/judgement Orientation Level: Time Memory: Decreased short-term memory Safety/Judgement: Decreased awareness of deficits;Decreased awareness of safety    Balance  Balance Overall balance assessment: Needs assistance Standardized Balance Assessment Standardized Balance Assessment : Berg Balance Test Berg Balance Test Sit to Stand: Able to stand without using hands and stabilize independently Standing Unsupported: Able to stand safely 2 minutes Sitting with Back Unsupported but Feet Supported on Floor or Stool: Able to sit safely and securely 2 minutes Stand to Sit: Sits safely with minimal use of hands Transfers: Able to transfer safely, minor use of hands Standing Unsupported with Eyes Closed: Able to stand 3 seconds Standing Ubsupported with Feet Together: Able to place feet together independently and stand 1 minute safely From Standing, Reach Forward with Outstretched Arm: Can reach confidently >25 cm (10") From Standing Position, Pick up Object from Floor: Able to pick up shoe safely and easily From Standing Position, Turn to Look Behind Over each Shoulder: Turn sideways only but maintains balance Turn 360  Degrees: Able to turn 360 degrees safely but slowly Standing Unsupported, Alternately Place Feet on Step/Stool:  Needs assistance to keep from falling or unable to try Standing Unsupported, One Foot in Front: Loses balance while stepping or standing Standing on One Leg: Tries to lift leg/unable to hold 3 seconds but remains standing independently Total Score: 39  End of Session PT - End of Session Equipment Utilized During Treatment: Gait belt;Oxygen Activity Tolerance: Patient tolerated treatment well Patient left: in chair;with call bell/phone within reach;with family/visitor present   GP     Lanetta Inch [facility] 06/16/2013, 10:45 AM Elwyn Reach, [place]

## 2013-06-16 NOTE — Progress Notes (Signed)
NUTRITION FOLLOW UP  Intervention:   1.  General healthful diet; encourage intake of foods and beverages as able.  RD to follow and assess for nutritional adequacy.  2.  Supplements; Ensure Complete po BID, each supplement provides 350 kcal and 13 grams of protein 3.  Modify diet; consider liberalizing diet to Regular in pt with acute illness, decreased intake, and HgBA1C of 5.9  Nutrition Dx:   Inadequate oral intake r/t to inability to eat, ongoing.   Monitor:   1.  Food/Beverage; pt meeting >/=90% estimated needs with tolerance.  Not met, pt with decreased intake 2.  Wt/wt change; monitor trends.  Ongoing.   Assessment:   Pt admitted with cardiac arrest; found to have RCA occlusion. UDS +THC.  Pt has been extubated.   Pt unavailable at time of visit.  Transferring out of unit at this time.  Diet advanced to Carb Mod Medium. PO intake is 25% of meals at times. Pt is considering CIR vs. Home with home health services.  Would likely benefit from addition of supplement.  RD to order.    Lab Results  Component Value Date   HGBA1C 5.9* 06/12/2013   Height: Ht Readings from Last 1 Encounters:  06/12/13 $RemoveB'5\' 6"'AYynoHwT$  (1.676 m)    Weight Status:   Wt Readings from Last 1 Encounters:  06/15/13 134 lb 11.2 oz (61.1 kg)    Re-estimated needs:  Kcal: 1900-2100 Protein: 80-95g Fluid: ~2.0 L/day  Skin: intact  Diet Order: Carb Control   Intake/Output Summary (Last 24 hours) at 06/16/13 1249 Last data filed at 06/16/13 1000  Gross per 24 hour  Intake    960 ml  Output   2015 ml  Net  -1055 ml    Last BM: 2/13  Labs:   Recent Labs Lab 06/12/13 0348  06/13/13 1632 06/13/13 2015 06/14/13 0430 06/15/13 0510  NA 138  < > 141 140 145 142  K 4.4  < > 3.5* 4.0 3.5* 4.2  CL 103  < > 106 104 110 105  CO2 18*  < > $R'22 22 21 24  'xS$ BUN 16  < > 5* $Re'7 7 13  'wtU$ CREATININE 0.94  < > 0.56 0.64 0.76 0.72  CALCIUM 8.3*  < > 7.7* 8.2* 7.8* 8.6  MG 2.2  < > 2.1 2.1 2.1  --   PHOS 3.6  --   --    --   --   --   GLUCOSE 190*  < > 132* 67* 187* 92  < > = values in this interval not displayed.  CBG (last 3)   Recent Labs  06/15/13 0045 06/15/13 0511 06/15/13 0737  GLUCAP 97 88 86    Scheduled Meds: . aspirin  81 mg Oral Daily  . atorvastatin  80 mg Oral q1800  . budesonide (PULMICORT) nebulizer solution  0.5 mg Nebulization BID  . doxycycline  100 mg Oral Q12H  . enoxaparin (LOVENOX) injection  40 mg Subcutaneous Q24H  . ipratropium-albuterol  3 mL Nebulization QID  . lisinopril  2.5 mg Oral Daily  . metoprolol tartrate  50 mg Oral BID  . Ticagrelor  90 mg Oral BID    Continuous Infusions: . sodium chloride 20 mL/hr at 06/14/13 0926    Brynda Greathouse, MS RD LDN Clinical Inpatient Dietitian Pager: [phone] Weekend/After hours pager: [phone]

## 2013-06-16 NOTE — Progress Notes (Signed)
Subjective: Pt is doing well this AM. No acute events overnight. HR in 90s.   Objective: Vital signs in last 24 hours: Filed Vitals:   06/16/13 0600 06/16/13 0700 06/16/13 0714 06/16/13 0800  BP: 103/74 125/71    Pulse: 103 90    Temp:    98.5 F (36.9 C)  TempSrc:    Oral  Resp: 22 8    Height:      Weight:      SpO2: 93% 91% 95% 91%   Weight change:   Intake/Output Summary (Last 24 hours) at 06/16/13 1009 Last data filed at 06/16/13 0745  Gross per 24 hour  Intake    480 ml  Output   2390 ml  Net  -1910 ml   General: sitting up in bed, NAD  HEENT: PERRL, no scleral icterus Cardiac: RRR, no rubs, murmurs or gallops Pulm: expiratory crackles, some decreased BS in bases Abd: soft, nondistended, BS present Ext: warm and well perfused, no pedal edema  Lab Results: Basic Metabolic Panel:  Recent Labs Lab 06/12/13 0348  06/13/13 2015 06/14/13 0430 06/15/13 0510  NA 138  < > 140 145 142  K 4.4  < > 4.0 3.5* 4.2  CL 103  < > 104 110 105  CO2 18*  < > 22 21 24   GLUCOSE 190*  < > 67* 187* 92  BUN 16  < > 7 7 13   CREATININE 0.94  < > 0.64 0.76 0.72  CALCIUM 8.3*  < > 8.2* 7.8* 8.6  MG 2.2  < > 2.1 2.1  --   PHOS 3.6  --   --   --   --   < > = values in this interval not displayed. CBC:  Recent Labs Lab 06/12/13 0411 06/12/13 0638  06/12/13 1002 06/13/13 0400  WBC  --  18.0*  --   --  15.1*  NEUTROABS  --  14.0*  --   --   --   HGB 16.0 14.5  < > 15.3 15.5  HCT 47.0 42.3  < > 45.0 44.5  MCV  --  89.6  --   --  88.8  PLT  --  267  --   --  267  < > = values in this interval not displayed. CBG:  Recent Labs Lab 06/14/13 1200 06/14/13 1613 06/14/13 1928 06/15/13 0045 06/15/13 0511 06/15/13 0737  GLUCAP 113* 109* 100* 97 88 86   Cardiac studies: 06/12/13 LHC: EF 50-55%, mild inferior hypocontractility w/o MR, mid RCA total occlusion s/p Xience 2.5x77mm DES.  Medications: I have reviewed the patient's current medications. Scheduled Meds: . aspirin   81 mg Oral Daily  . atorvastatin  80 mg Oral q1800  . budesonide (PULMICORT) nebulizer solution  0.5 mg Nebulization BID  . doxycycline  100 mg Oral Q12H  . enoxaparin (LOVENOX) injection  40 mg Subcutaneous Q24H  . ipratropium-albuterol  3 mL Nebulization QID  . lisinopril  2.5 mg Oral Daily  . metoprolol tartrate  50 mg Oral BID  . Ticagrelor  90 mg Oral BID   Continuous Infusions: . sodium chloride 20 mL/hr at 06/14/13 0926   PRN Meds:.sodium chloride, acetaminophen, albuterol, ondansetron (ZOFRAN) IV Assessment/Plan: Principal Problem:   Cardiac arrest Active Problems:   VT (ventricular tachycardia)   COPD exacerbation   NSTEMI (non-ST elevated myocardial infarction)  1. VT arrest s/p PCI DES to mid RCA: Pt presented at home in VF/VT arrest had ROSC after 2 shocks, CPR and  amio load then EKG with lateral wall inversions. Pt emergently taken to [facility] on 06/12/13 found to have total mid RCA occlusion s/p 2.5x34mm DES. UDS +THC, HgbA1c 5.9, LDL 77. No repeat arrhythmias or events on repeat EKGs.  -cont brilinta, ASA, metoprolol 50mg  BID, statin -start lisinopril 2.5mg  daily on 06/16/13 given better BP control may need to increase if can tolerate -CIR consult - transfer to tele  2. Hypoxia: pt still desats in the low 90s with ambulation was being treated empirically for COPD exacerbation/bronchitis. PCCM was following and left recs for discharge.  -will complete doxy on 06/17/13 -discharge on spiriva and albuterol prn -repeat CXR on 06/16/13   LOS: 4 days   Clinton Gallant, MD 06/16/2013, 10:09 AM  Patient seen, examined. Available data reviewed. Agree with findings, assessment, and plan as outlined by Dr Algis Liming. The patient complains of some residual chest soreness. He denies shortness of breath. On exam he is alert and oriented in no distress. He is somewhat slow to answer questions. Lungs show scattered rhonchi. Heart is tachycardic and regular without murmur. There is no peripheral edema.  His abdomen is soft and nontender without rebound or guarding. Bowel sounds are present.  He continues to improve. I think he is ready to transfer to telemetry. His anoxic encephalopathy is improving but he is clearly not back to baseline. From a cardiac perspective, we will increase his beta blocker today and at lisinopril at low dose. Appreciate pulmonary recommendations and those will be followed with doxycycline and Spiriva. He will continue on aspirin, brilinta, and high-dose atorvastatin. He has been evaluated by [facility] inpatient rehabilitation and his progress will be tracts to see if he will require that versus discharge home with home health. Suspect he will not be ready for discharge until early next week.  Sherren Mocha, M.D. 06/16/2013 10:40 AM

## 2013-06-16 NOTE — Progress Notes (Signed)
CARDIAC REHAB PHASE I   PRE:  Rate/Rhythm: 116 ST  BP:  Supine:   Sitting: 110/70  Standing:    SaO2: 95 3L  MODE:  Ambulation: 550 ft   POST:  Rate/Rhythm: 124 ST  BP:  Supine:   Sitting: 134/84  Standing:    SaO2: 92 4L 1500-1540  On arrival pt in recliner without c/o. Assisted X 1 used gait belt and O2 4L  to ambulate.  Pt states that he did not want to use the walker. Gait fairly steady, he did not walk straight line. Pt oriented, but is having trouble with his short term memory.He was able to walk 550 feet without c/o of cp or SOB. Pt back to recliner after walk. I left him a MI booklet. Pt back to recliner after walk with call light in reach.  Rodney Langton RN 06/16/2013 3:50 PM

## 2013-06-16 NOTE — Consult Note (Signed)
Physical Medicine and Rehabilitation Consult  Reason for Consult: VF arrest due to ACS Referring Physician:  Dr. Claiborne Billings    HPI: Timothy [name] is a 58 y.o. male without prior medical issues but former smoker/ no previous on meds;  who was found unresponsive in his home on 06/12/13.  EMS activated and he received CPR, 2 shocks for VF/VT, amio bolus x1 given, and hypothermia protocol initiated in the field. He was last seen an hour before the arrest doing well. Cardiac cath with acute occlusion of the mid right coronary artery and underwent PTCA/stentin with DES stent by Dr. Claiborne Billings. Has had agitation and CT head without acute changes. He was extubated on 06/14/13 and mild COPD exacerbation treated with doxycyline and solumedrol. Therapy evaluations done yesterday and CIR recommended by rehab team.     ROS  Past Medical History  Diagnosis Date  . Medical history non-contributory    Past Surgical History  Procedure Laterality Date  . No past surgeries     History reviewed. No pertinent family history.  Social History:  reports that he has been smoking Cigarettes.  He has a 30 pack-year smoking history. He does not have any smokeless tobacco history on file. He reports that he does not drink alcohol or use illicit drugs.  Allergies: No Known Allergies   No prescriptions prior to admission    Home: Home Living Family/patient expects to be discharged to:: Private residence Living Arrangements: Spouse/significant other Available Help at Discharge: Family;Available PRN/intermittently Type of Home: House Home Access: Level entry Home Layout: One level Home Equipment: None  Functional History: Prior Function Comments: pt works full time as a Administrator, sports Status:  Mobility:     Ambulation/Gait Ambulation Distance (Feet): 60 Feet General Gait Details: cueing for posture and position in RW    ADL:    Cognition: Cognition Overall Cognitive Status:  Impaired/Different from baseline Orientation Level: Oriented to person;Oriented to place;Oriented to time Cognition Arousal/Alertness: Awake/alert Behavior During Therapy: Flat affect Overall Cognitive Status: Impaired/Different from baseline Area of Impairment: Orientation;Memory;Safety/judgement Orientation Level: Time Memory: Decreased short-term memory Safety/Judgement: Decreased awareness of deficits;Decreased awareness of safety  Blood pressure 125/71, pulse 90, temperature 98.4 F (36.9 C), temperature source Oral, resp. rate 8, height 5\' 6"  (1.676 m), weight 134 lb 11.2 oz (61.1 kg), SpO2 95.00%. Physical Exam  No results found for this or any previous visit (from the past 24 hour(s)). No results found.  Assessment/Plan: Diagnosis: anoxic encephalopathy after cardiac arrest 1. Does the need for close, 24 hr/day medical supervision in concert with the patient's rehab needs make it unreasonable for this patient to be served in a less intensive setting? Potentially 2. Co-Morbidities requiring supervision/potential complications: copd, MI 3. Due to bladder management, bowel management, skin/wound care, disease management and medication administration, does the patient require 24 hr/day rehab nursing? Potentially 4. Does the patient require coordinated care of a physician, rehab nurse, PT (1-2 hrs/day, 5 days/week), OT (1-2 hrs/day, 5 days/week) and SLP (1-2 hrs/day, 5 days/week) to address physical and functional deficits in the context of the above medical diagnosis(es)? Potentially Addressing deficits in the following areas: balance, endurance, locomotion, strength, bowel/bladder control, feeding and cognition 5. Can the patient actively participate in an intensive therapy program of at least 3 hrs of therapy per day at least 5 days per week? Potentially 6. The potential for patient to make measurable gains while on inpatient rehab is good and fair 7. Anticipated functional outcomes  upon discharge  from inpatient rehab are mod I with PT, mod I with OT, mod I/supervision with SLP. 8. Estimated rehab length of stay to reach the above functional goals is: TBD 9. Does the patient have adequate social supports to accommodate these discharge functional goals? Potentially 10. Anticipated D/C setting: Home 11. Anticipated post D/C treatments: [place] therapy 12. Overall Rehab/Functional Prognosis: excellent  RECOMMENDATIONS: This patient's condition is appropriate for continued rehabilitative care in the following setting: Home health vs brief CIR Patient has agreed to participate in recommended program. Potentially Note that insurance prior authorization may be required for reimbursement for recommended care.  Comment: Pt appears to be improving quite a bit. Will follow along for functional and medical progress. Tells me his girlfriend can be with him at home initially. May not need [place]. Rehab Admissions Coordinator to follow up.  Thanks,  Meredith Staggers, MD, Mellody Drown     06/16/2013

## 2013-06-16 NOTE — Plan of Care (Signed)
Problem: Phase III Progression Outcomes Goal: Mental status at or near baseline Outcome: Completed/Met Date Met:  06/16/13 Family at patient's bedside this afternoon state patient is more back to his mental baseline.

## 2013-06-16 NOTE — Progress Notes (Signed)
Patient's heart rate up to 130-140's while patient up to bathroom.  Patient denies any chest pain or shortness of breath.  Heart rate sustain in 110-120's.  Tarri Fuller PA paged and notified, stated may give pm dose of metoprolol a little early if needed.  Information passed on to night shift RN. Will continue to monitor.  Sanda Linger

## 2013-06-17 DIAGNOSIS — G931 Anoxic brain damage, not elsewhere classified: Secondary | ICD-10-CM

## 2013-06-17 DIAGNOSIS — R000 Tachycardia, unspecified: Secondary | ICD-10-CM

## 2013-06-17 DIAGNOSIS — F17200 Nicotine dependence, unspecified, uncomplicated: Secondary | ICD-10-CM

## 2013-06-17 DIAGNOSIS — E785 Hyperlipidemia, unspecified: Secondary | ICD-10-CM

## 2013-06-17 DIAGNOSIS — I472 Ventricular tachycardia, unspecified: Secondary | ICD-10-CM

## 2013-06-17 DIAGNOSIS — I4729 Other ventricular tachycardia: Secondary | ICD-10-CM

## 2013-06-17 MED ORDER — METOPROLOL TARTRATE 25 MG PO TABS
25.0000 mg | ORAL_TABLET | Freq: Once | ORAL | Status: AC
  Administered 2013-06-17: 25 mg via ORAL
  Filled 2013-06-17: qty 1

## 2013-06-17 MED ORDER — METOPROLOL TARTRATE 50 MG PO TABS
75.0000 mg | ORAL_TABLET | Freq: Two times a day (BID) | ORAL | Status: DC
  Administered 2013-06-17 – 2013-06-19 (×4): 75 mg via ORAL
  Filled 2013-06-17 (×5): qty 1

## 2013-06-17 NOTE — Progress Notes (Signed)
SUBJECTIVE: Pt has some mild chest soreness/tenderness to palpation, but denies chest tightness/burning. Says shortness of breath has improved. Denies palpitations and dizziness.     Intake/Output Summary (Last 24 hours) at 06/17/13 1117 Last data filed at 06/16/13 1900  Gross per 24 hour  Intake 666.33 ml  Output      2 ml  Net 664.33 ml    Current Facility-Administered Medications  Medication Dose Route Frequency Provider Last Rate Last Dose  . 0.9 %  sodium chloride infusion   Intravenous Continuous Clinton Gallant, MD      . 0.9 %  sodium chloride infusion  250 mL Intravenous PRN Alric Quan, MD      . acetaminophen (TYLENOL) tablet 650 mg  650 mg Oral Q4H PRN Troy Sine, MD   650 mg at 06/15/13 1859  . albuterol (PROVENTIL) (2.5 MG/3ML) 0.083% nebulizer solution 2.5 mg  2.5 mg Nebulization Q4H PRN Wilhelmina Mcardle, MD      . aspirin chewable tablet 81 mg  81 mg Oral Daily Troy Sine, MD   81 mg at 06/14/13 0913  . atorvastatin (LIPITOR) tablet 80 mg  80 mg Oral q1800 Troy Sine, MD   80 mg at 06/16/13 1627  . budesonide (PULMICORT) nebulizer solution 0.5 mg  0.5 mg Nebulization BID Wilhelmina Mcardle, MD   0.5 mg at 06/17/13 0810  . doxycycline (VIBRA-TABS) tablet 100 mg  100 mg Oral Q12H Wilhelmina Mcardle, MD   100 mg at 06/17/13 1024  . enoxaparin (LOVENOX) injection 40 mg  40 mg Subcutaneous Q24H Rigoberto Noel, MD   40 mg at 06/16/13 2037  . feeding supplement (ENSURE COMPLETE) (ENSURE COMPLETE) liquid 237 mL  237 mL Oral BID BM Elonda Husky, RD   237 mL at 06/16/13 1410  . ipratropium-albuterol (DUONEB) 0.5-2.5 (3) MG/3ML nebulizer solution 3 mL  3 mL Nebulization QID Wilhelmina Mcardle, MD   3 mL at 06/17/13 0810  . lisinopril (PRINIVIL,ZESTRIL) tablet 2.5 mg  2.5 mg Oral Daily Clinton Gallant, MD   2.5 mg at 06/17/13 1024  . metoprolol (LOPRESSOR) tablet 50 mg  50 mg Oral BID Clinton Gallant, MD   50 mg at 06/17/13 1024  . ondansetron (ZOFRAN) injection 4 mg  4 mg  Intravenous Q6H PRN Troy Sine, MD   4 mg at 06/14/13 2200  . Ticagrelor (BRILINTA) tablet 90 mg  90 mg Oral BID Stephani Police, MD   90 mg at 06/17/13 1026    Filed Vitals:   06/17/13 0547 06/17/13 0812 06/17/13 0916 06/17/13 1024  BP: 98/67  103/70 112/70  Pulse: 100  108 110  Temp: 98.5 F (36.9 C)  98.3 F (36.8 C)   TempSrc: Oral  Oral   Resp: 18     Height:      Weight: 134 lb 0.6 oz (60.8 kg)     SpO2: 95% 95% 92%     PHYSICAL EXAM General: NAD Neck: No JVD, no thyromegaly or thyroid nodule.  Lungs: Clear but diminished to auscultation bilaterally with normal respiratory effort. No rales/wheezes. CV: Nondisplaced PMI.  Regular rhythm, tachycardic, normal S1/S2, no S3/S4, no murmur.  No pretibial edema.  No carotid bruit.  Normal pedal pulses.  Abdomen: Soft, nontender, no hepatosplenomegaly, no distention.  Neurologic: Alert and oriented x 3.  Psych: Normal affect. Extremities: No clubbing or cyanosis.   TELEMETRY: Reviewed telemetry pt in sinus tachycardia, one ventricular couplet noted.  LABS: Basic Metabolic Panel:  Recent Labs  06/15/13 0510  NA 142  K 4.2  CL 105  CO2 24  GLUCOSE 92  BUN 13  CREATININE 0.72  CALCIUM 8.6   Liver Function Tests: No results found for this basename: AST, ALT, ALKPHOS, BILITOT, PROT, ALBUMIN,  in the last 72 hours No results found for this basename: LIPASE, AMYLASE,  in the last 72 hours CBC: No results found for this basename: WBC, NEUTROABS, HGB, HCT, MCV, PLT,  in the last 72 hours Cardiac Enzymes: No results found for this basename: CKTOTAL, CKMB, CKMBINDEX, TROPONINI,  in the last 72 hours BNP: No components found with this basename: POCBNP,  D-Dimer: No results found for this basename: DDIMER,  in the last 72 hours Hemoglobin A1C: No results found for this basename: HGBA1C,  in the last 72 hours Fasting Lipid Panel: No results found for this basename: CHOL, HDL, LDLCALC, TRIG, CHOLHDL, LDLDIRECT,  in the last  72 hours Thyroid Function Tests: No results found for this basename: TSH, T4TOTAL, FREET3, T3FREE, THYROIDAB,  in the last 72 hours Anemia Panel: No results found for this basename: VITAMINB12, FOLATE, FERRITIN, TIBC, IRON, RETICCTPCT,  in the last 72 hours  RADIOLOGY: Dg Chest Port 1 View  06/16/2013   CLINICAL DATA:  VT arrest  EXAM: PORTABLE CHEST - 1 VIEW  COMPARISON:  DG CHEST 1V PORT dated 06/13/2013  FINDINGS: The heart size and mediastinal contours are within normal limits. There is increased density projects in region right middle lobe. No further focal regions of consolidation or focal infiltrates appreciated. There is trace blunting of the left costophrenic angle. The osseous structures unremarkable.  IMPRESSION: Atelectasis versus infiltrate right middle lobe. Findings consistent with a trace left pleural effusion.   Electronically Signed   By: Margaree Mackintosh M.D.   On: 06/16/2013 12:14   Dg Chest Port 1 View  06/13/2013   CLINICAL DATA:  Hypoxia  EXAM: PORTABLE CHEST - 1 VIEW  COMPARISON:  June 12, 2013  FINDINGS: Endotracheal tube tip is 4.8 cm above the carina. Central catheter tip is in the superior vena cava. Nasogastric tube tip and side port are below the diaphragm. No pneumothorax. Patchy interstitial edema remains. There is no new opacity. The heart size and pulmonary vascularity are normal.  IMPRESSION: Tube and catheter positions as described without pneumothorax. Stable interstitial edema. No new opacity.   Electronically Signed   By: Lowella Grip M.D.   On: 06/13/2013 07:05   Dg Chest Port 1 View  06/12/2013   CLINICAL DATA:  Line placement  EXAM: PORTABLE CHEST - 1 VIEW  COMPARISON:  DG CHEST 1V PORT dated 06/12/2013  FINDINGS: There is a right jugular central venous catheter with the tip projecting over the SVC. There is a nasogastric tube coursing below the diaphragm. There is an endotracheal tube in satisfactory position. There is bilateral mild interstitial thickening.  There is no pleural effusion, focal parenchymal opacity or pneumothorax. The heart and mediastinal contours are unremarkable.  The osseous structures are unremarkable.  IMPRESSION: Right jugular central venous catheter with the tip projecting over the SVC.   Electronically Signed   By: Kathreen Devoid   On: 06/12/2013 12:07   Dg Chest Portable 1 View  06/12/2013   CLINICAL DATA:  Cardiac arrest, endotracheal tube.  EXAM: PORTABLE CHEST - 1 VIEW  COMPARISON:  None available for comparison at time of study interpretation.  FINDINGS: Cardiomediastinal silhouette is unremarkable. Mild interstitial prominence suggesting chronicity, with  bullous change of the right lung apex. No pleural effusions or focal consolidations. No pneumothorax.  Endotracheal tube tip projects 5.7 cm above the carina. Nasogastric tube side port projects at the gastroesophageal junction, distal tip not imaged.  Multiple EKG lines overlie the patient and may obscure subtle underlying pathology. Soft tissue planes and included osseous structures are nonsuspicious.  IMPRESSION: Endotracheal tube tip projects 5.7 cm above the carina, nasogastric tube, side port projects at gastroesophageal junction.  COPD without acute cardiopulmonary process.   Electronically Signed   By: Elon Alas   On: 06/12/2013 03:55   Ct Portable Head W/o Cm  06/12/2013   CLINICAL DATA:  Cardiac arrest  EXAM: CT HEAD WITHOUT CONTRAST  TECHNIQUE: Contiguous axial images were obtained from the base of the skull through the vertex without intravenous contrast.  COMPARISON:  None available  FINDINGS: There is no acute intracranial hemorrhage or infarct. No mass lesion or midline shift. Gray-white matter differentiation is well maintained. No evidence of significant cerebral edema or anoxia at this time. Ventricles are normal in size without evidence of hydrocephalus. CSF containing spaces are within normal limits. Basilar cisterns are widely patent. No extra-axial fluid  collection.  The calvarium is intact.  Orbital soft tissues are within normal limits.  Endotracheal and enteric tubes are partially visualized. Moderate mucoperiosteal thickening present within the maxillary sinuses and ethmoidal air cells bilaterally. No mastoid effusion.  Scalp soft tissues are unremarkable.  IMPRESSION: 1. No acute intracranial abnormality identified. No evidence of significant cerebral anoxia or edema at this time. 2. Moderate inflammatory maxillary and ethmoidal sinus disease.   Electronically Signed   By: Jeannine Boga M.D.   On: 06/12/2013 12:38      ASSESSMENT AND PLAN: 1. VT arrest s/p PCI DES to mid RCA: Pt presented at home in VF/VT arrest had ROSC after 2 shocks, CPR and amio load then EKG with lateral wall inversions. Pt emergently taken to [facility] on 06/12/13 found to have total mid RCA occlusion s/p 2.5x37mm DES. UDS +THC, HgbA1c 5.9, LDL 77. No repeat arrhythmias or events on repeat EKGs.  -cont brilinta, ASA, increase metoprolol to 75 mg BID as pt still tachycardic at rest, and high dose Lipitor 80 mg daily. -continue low-dose lisinopril 2.5mg  daily. May need to increase if can tolerate, likely as outpatient.  2. Hypoxia: pt still desats to 86% and then up to 94% with deep breaths with ambulation. Being treated empirically for COPD exacerbation/bronchitis. PCCM was following and left recs for discharge.  -will complete doxycycline today. -discharge on spiriva and albuterol prn  -repeat CXR on 06/16/13 showed atelectasis vs right middle lobe infiltrate.  Dispo: home health vs brief CIR, discharge likely early next week.   Kate Sable, M.D., F.A.C.C.

## 2013-06-17 NOTE — Progress Notes (Addendum)
CARDIAC REHAB PHASE I   PRE:  Rate/Rhythm: 108 ST    BP: sitting 120/70    SaO2: 91 RA  MODE:  Ambulation: 550 ft   POST:  Rate/Rhythm: 130 ST    BP: sitting 110/70     SaO2: 86 RA up to 94 with deep breathing/rest  Pt steadier, did not need assist with simple, straight walking. Slight imbalance on turns. HR up to 130 ST. SaO2 low after walk, just weaned from O2 last night. Pt anxious to d/c. Will need to ed with family if for d/c this weekend.  [id number] Returned and discussed ed with family (many members present). Pt distracted but girlfriend very receptive. Voiced understanding. Pt would benefit from further reiteration. Interested in [place] and will send referral to [facility]. Will give financial aid application. Also pt needs Brilinta assistance. Notified RN who will contact CM. [phone]  Timothy [name] [place] CES, ACSM 06/17/2013 10:27 AM

## 2013-06-18 NOTE — Progress Notes (Signed)
   CARE MANAGEMENT NOTE 06/18/2013  Patient:  Timothy [name], Timothy [name]   Account Number:  [id number]  Date Initiated:  06/18/2013  Documentation initiated by:  [facility]  Subjective/Objective Assessment:   adm: Cardiac arrest (VT)     Action/Plan:   discharge planning; possible MATCH at discharge   Anticipated DC Date:  06/19/2013   Anticipated DC Plan:  [place]  CM consult  Medication Assistance      Choice offered to / List presented to:             Status of service:  In process, will continue to follow Medicare Important Message given?   (If response is "NO", the following Medicare IM given date fields will be blank) Date Medicare IM given:   Date Additional Medicare IM given:    Discharge Disposition:    Per UR Regulation:    If discussed at Long Length of Stay Meetings, dates discussed:    Comments:  06/18/13 13:30 CM spoke with pt who was concerned bc today is the last day of Open Enrollment and he has not secured insurance.  CM brought pt [employer] [facility] handout so he could call and make an appt with a navigator for insurance.  CM also gave pt handout for PCPs and circled [place] for him to call and make an appt for an orange card.  Pt is very organized and has all the information he needs to obtain insurance in a folder with him.  Pt may need a MATCH letter upon discharge depending on the medications ordered.  CM will continue to follow for disposition needs. Mariane Masters, BSN, CM [phone].

## 2013-06-18 NOTE — Progress Notes (Signed)
SUBJECTIVE: Pt continues to have some chest soreness and tenderness to palpation, but denies chest tightness/burning. Says shortness of breath has improved. Denies palpitations and dizziness. Has not ambulated yet.      Intake/Output Summary (Last 24 hours) at 06/18/13 0840 Last data filed at 06/17/13 2025  Gross per 24 hour  Intake    240 ml  Output      0 ml  Net    240 ml    Current Facility-Administered Medications  Medication Dose Route Frequency Provider Last Rate Last Dose  . 0.9 %  sodium chloride infusion   Intravenous Continuous Clinton Gallant, MD      . 0.9 %  sodium chloride infusion  250 mL Intravenous PRN Alric Quan, MD      . acetaminophen (TYLENOL) tablet 650 mg  650 mg Oral Q4H PRN Troy Sine, MD   650 mg at 06/15/13 1859  . albuterol (PROVENTIL) (2.5 MG/3ML) 0.083% nebulizer solution 2.5 mg  2.5 mg Nebulization Q4H PRN Wilhelmina Mcardle, MD      . aspirin chewable tablet 81 mg  81 mg Oral Daily Troy Sine, MD   81 mg at 06/14/13 0913  . atorvastatin (LIPITOR) tablet 80 mg  80 mg Oral q1800 Troy Sine, MD   80 mg at 06/17/13 1709  . budesonide (PULMICORT) nebulizer solution 0.5 mg  0.5 mg Nebulization BID Wilhelmina Mcardle, MD   0.5 mg at 06/17/13 2040  . doxycycline (VIBRA-TABS) tablet 100 mg  100 mg Oral Q12H Wilhelmina Mcardle, MD   100 mg at 06/17/13 2121  . enoxaparin (LOVENOX) injection 40 mg  40 mg Subcutaneous Q24H Rigoberto Noel, MD   40 mg at 06/17/13 2121  . feeding supplement (ENSURE COMPLETE) (ENSURE COMPLETE) liquid 237 mL  237 mL Oral BID BM Elonda Husky, RD   237 mL at 06/17/13 1211  . ipratropium-albuterol (DUONEB) 0.5-2.5 (3) MG/3ML nebulizer solution 3 mL  3 mL Nebulization QID Wilhelmina Mcardle, MD   3 mL at 06/17/13 2040  . lisinopril (PRINIVIL,ZESTRIL) tablet 2.5 mg  2.5 mg Oral Daily Clinton Gallant, MD   2.5 mg at 06/17/13 1024  . metoprolol tartrate (LOPRESSOR) tablet 75 mg  75 mg Oral BID Herminio Commons, MD   75 mg at  06/17/13 2121  . ondansetron (ZOFRAN) injection 4 mg  4 mg Intravenous Q6H PRN Troy Sine, MD   4 mg at 06/14/13 2200  . Ticagrelor (BRILINTA) tablet 90 mg  90 mg Oral BID Stephani Police, MD   90 mg at 06/17/13 2121    Filed Vitals:   06/17/13 1515 06/17/13 2040 06/17/13 2139 06/18/13 0533  BP:   129/62 107/74  Pulse:   119 88  Temp:   97.4 F (36.3 C) 97.2 F (36.2 C)  TempSrc:   Oral Oral  Resp:    17  Height:      Weight:      SpO2: 95% 94% 94% 95%    PHYSICAL EXAM General: NAD Neck: No JVD, no thyromegaly or thyroid nodule.  Lungs: Clear to auscultation bilaterally with normal respiratory effort. CV: Nondisplaced PMI.  Regular rhythm, normal S1/S2, no S3/S4, no murmur.  No pretibial edema.  No carotid bruit.  Normal pedal pulses.  Abdomen: Soft, nontender, no hepatosplenomegaly, no distention.  Neurologic: Alert and oriented x 3.  Psych: Normal affect. Extremities: No clubbing or cyanosis.   TELEMETRY: Reviewed telemetry pt in sinus  rhythm, sinus tach, and few PVC's.  LABS: Basic Metabolic Panel: No results found for this basename: NA, K, CL, CO2, GLUCOSE, BUN, CREATININE, CALCIUM, MG, PHOS,  in the last 72 hours Liver Function Tests: No results found for this basename: AST, ALT, ALKPHOS, BILITOT, PROT, ALBUMIN,  in the last 72 hours No results found for this basename: LIPASE, AMYLASE,  in the last 72 hours CBC: No results found for this basename: WBC, NEUTROABS, HGB, HCT, MCV, PLT,  in the last 72 hours Cardiac Enzymes: No results found for this basename: CKTOTAL, CKMB, CKMBINDEX, TROPONINI,  in the last 72 hours BNP: No components found with this basename: POCBNP,  D-Dimer: No results found for this basename: DDIMER,  in the last 72 hours Hemoglobin A1C: No results found for this basename: HGBA1C,  in the last 72 hours Fasting Lipid Panel: No results found for this basename: CHOL, HDL, LDLCALC, TRIG, CHOLHDL, LDLDIRECT,  in the last 72 hours Thyroid Function  Tests: No results found for this basename: TSH, T4TOTAL, FREET3, T3FREE, THYROIDAB,  in the last 72 hours Anemia Panel: No results found for this basename: VITAMINB12, FOLATE, FERRITIN, TIBC, IRON, RETICCTPCT,  in the last 72 hours  RADIOLOGY: Dg Chest Port 1 View  06/16/2013   CLINICAL DATA:  VT arrest  EXAM: PORTABLE CHEST - 1 VIEW  COMPARISON:  DG CHEST 1V PORT dated 06/13/2013  FINDINGS: The heart size and mediastinal contours are within normal limits. There is increased density projects in region right middle lobe. No further focal regions of consolidation or focal infiltrates appreciated. There is trace blunting of the left costophrenic angle. The osseous structures unremarkable.  IMPRESSION: Atelectasis versus infiltrate right middle lobe. Findings consistent with a trace left pleural effusion.   Electronically Signed   By: Margaree Mackintosh M.D.   On: 06/16/2013 12:14   Dg Chest Port 1 View  06/13/2013   CLINICAL DATA:  Hypoxia  EXAM: PORTABLE CHEST - 1 VIEW  COMPARISON:  June 12, 2013  FINDINGS: Endotracheal tube tip is 4.8 cm above the carina. Central catheter tip is in the superior vena cava. Nasogastric tube tip and side port are below the diaphragm. No pneumothorax. Patchy interstitial edema remains. There is no new opacity. The heart size and pulmonary vascularity are normal.  IMPRESSION: Tube and catheter positions as described without pneumothorax. Stable interstitial edema. No new opacity.   Electronically Signed   By: Lowella Grip M.D.   On: 06/13/2013 07:05   Dg Chest Port 1 View  06/12/2013   CLINICAL DATA:  Line placement  EXAM: PORTABLE CHEST - 1 VIEW  COMPARISON:  DG CHEST 1V PORT dated 06/12/2013  FINDINGS: There is a right jugular central venous catheter with the tip projecting over the SVC. There is a nasogastric tube coursing below the diaphragm. There is an endotracheal tube in satisfactory position. There is bilateral mild interstitial thickening. There is no pleural  effusion, focal parenchymal opacity or pneumothorax. The heart and mediastinal contours are unremarkable.  The osseous structures are unremarkable.  IMPRESSION: Right jugular central venous catheter with the tip projecting over the SVC.   Electronically Signed   By: Kathreen Devoid   On: 06/12/2013 12:07   Dg Chest Portable 1 View  06/12/2013   CLINICAL DATA:  Cardiac arrest, endotracheal tube.  EXAM: PORTABLE CHEST - 1 VIEW  COMPARISON:  None available for comparison at time of study interpretation.  FINDINGS: Cardiomediastinal silhouette is unremarkable. Mild interstitial prominence suggesting chronicity, with bullous change of  the right lung apex. No pleural effusions or focal consolidations. No pneumothorax.  Endotracheal tube tip projects 5.7 cm above the carina. Nasogastric tube side port projects at the gastroesophageal junction, distal tip not imaged.  Multiple EKG lines overlie the patient and may obscure subtle underlying pathology. Soft tissue planes and included osseous structures are nonsuspicious.  IMPRESSION: Endotracheal tube tip projects 5.7 cm above the carina, nasogastric tube, side port projects at gastroesophageal junction.  COPD without acute cardiopulmonary process.   Electronically Signed   By: Elon Alas   On: 06/12/2013 03:55   Ct Portable Head W/o Cm  06/12/2013   CLINICAL DATA:  Cardiac arrest  EXAM: CT HEAD WITHOUT CONTRAST  TECHNIQUE: Contiguous axial images were obtained from the base of the skull through the vertex without intravenous contrast.  COMPARISON:  None available  FINDINGS: There is no acute intracranial hemorrhage or infarct. No mass lesion or midline shift. Gray-white matter differentiation is well maintained. No evidence of significant cerebral edema or anoxia at this time. Ventricles are normal in size without evidence of hydrocephalus. CSF containing spaces are within normal limits. Basilar cisterns are widely patent. No extra-axial fluid collection.  The  calvarium is intact.  Orbital soft tissues are within normal limits.  Endotracheal and enteric tubes are partially visualized. Moderate mucoperiosteal thickening present within the maxillary sinuses and ethmoidal air cells bilaterally. No mastoid effusion.  Scalp soft tissues are unremarkable.  IMPRESSION: 1. No acute intracranial abnormality identified. No evidence of significant cerebral anoxia or edema at this time. 2. Moderate inflammatory maxillary and ethmoidal sinus disease.   Electronically Signed   By: Jeannine Boga M.D.   On: 06/12/2013 12:38      ASSESSMENT AND PLAN: 1. VT arrest s/p PCI DES to mid RCA: Pt presented at home in VF/VT arrest had ROSC after 2 shocks, CPR and amio load then EKG with lateral wall inversions. Pt emergently taken to [facility] on 06/12/13 found to have total mid RCA occlusion s/p 2.5x65mm DES. UDS +THC, HgbA1c 5.9, LDL 77. No repeat arrhythmias or events on repeat EKGs.  -cont brilinta, ASA, and metoprolol (increased to 75 mg BID on 2/14 for resting tachycardia), and high dose Lipitor 80 mg daily.  -continue low-dose lisinopril 2.5mg  daily. May need to increase if can tolerate, likely as outpatient.   2. Hypoxia: pt desaturated to 86% and then up to 94% with deep breaths with ambulation on 2/14. Awaiting cardiac rehab follow up today. Being treated empirically for COPD exacerbation/bronchitis. PCCM was following and left recs for discharge.  -completed doxycycline therapy on 2/14.  -discharge on spiriva and albuterol prn  -repeat CXR on 06/16/13 showed atelectasis vs right middle lobe infiltrate.   Dispo: home health vs brief CIR, discharge likely early this week.    Kate Sable, M.D., F.A.C.C.

## 2013-06-19 ENCOUNTER — Other Ambulatory Visit: Payer: Self-pay | Admitting: Cardiology

## 2013-06-19 ENCOUNTER — Encounter (HOSPITAL_COMMUNITY): Payer: Self-pay | Admitting: Cardiology

## 2013-06-19 DIAGNOSIS — Z87891 Personal history of nicotine dependence: Secondary | ICD-10-CM | POA: Diagnosis present

## 2013-06-19 DIAGNOSIS — Z9861 Coronary angioplasty status: Secondary | ICD-10-CM | POA: Diagnosis present

## 2013-06-19 DIAGNOSIS — I2510 Atherosclerotic heart disease of native coronary artery without angina pectoris: Secondary | ICD-10-CM | POA: Diagnosis present

## 2013-06-19 DIAGNOSIS — I472 Ventricular tachycardia, unspecified: Secondary | ICD-10-CM

## 2013-06-19 DIAGNOSIS — I469 Cardiac arrest, cause unspecified: Secondary | ICD-10-CM

## 2013-06-19 DIAGNOSIS — E785 Hyperlipidemia, unspecified: Secondary | ICD-10-CM

## 2013-06-19 DIAGNOSIS — J42 Unspecified chronic bronchitis: Secondary | ICD-10-CM

## 2013-06-19 DIAGNOSIS — Z720 Tobacco use: Secondary | ICD-10-CM

## 2013-06-19 DIAGNOSIS — E876 Hypokalemia: Secondary | ICD-10-CM | POA: Diagnosis present

## 2013-06-19 DIAGNOSIS — R4182 Altered mental status, unspecified: Secondary | ICD-10-CM | POA: Diagnosis present

## 2013-06-19 DIAGNOSIS — I214 Non-ST elevation (NSTEMI) myocardial infarction: Secondary | ICD-10-CM

## 2013-06-19 DIAGNOSIS — R092 Respiratory arrest: Secondary | ICD-10-CM

## 2013-06-19 DIAGNOSIS — I959 Hypotension, unspecified: Secondary | ICD-10-CM

## 2013-06-19 HISTORY — DX: Hyperlipidemia, unspecified: E78.5

## 2013-06-19 HISTORY — DX: Unspecified chronic bronchitis: J42

## 2013-06-19 HISTORY — DX: Hypokalemia: E87.6

## 2013-06-19 HISTORY — DX: Hypotension, unspecified: I95.9

## 2013-06-19 HISTORY — DX: Tobacco use: Z72.0

## 2013-06-19 HISTORY — DX: Hypomagnesemia: E83.42

## 2013-06-19 HISTORY — DX: Altered mental status, unspecified: R41.82

## 2013-06-19 LAB — CBC
HCT: 45.6 % (ref 39.0–52.0)
Hemoglobin: 15.9 g/dL (ref 13.0–17.0)
MCH: 31.4 pg (ref 26.0–34.0)
MCHC: 34.9 g/dL (ref 30.0–36.0)
MCV: 90.1 fL (ref 78.0–100.0)
PLATELETS: 397 10*3/uL (ref 150–400)
RBC: 5.06 MIL/uL (ref 4.22–5.81)
RDW: 13.6 % (ref 11.5–15.5)
WBC: 15.4 10*3/uL — ABNORMAL HIGH (ref 4.0–10.5)

## 2013-06-19 LAB — BASIC METABOLIC PANEL
BUN: 22 mg/dL (ref 6–23)
CALCIUM: 9.4 mg/dL (ref 8.4–10.5)
CO2: 22 mEq/L (ref 19–32)
Chloride: 101 mEq/L (ref 96–112)
Creatinine, Ser: 0.68 mg/dL (ref 0.50–1.35)
GFR calc Af Amer: >90 mL/min (ref >90)
GFR calc non Af Amer: >90 mL/min (ref >90)
GLUCOSE: 102 mg/dL — AB (ref 70–99)
Potassium: 4.7 mEq/L (ref 3.7–5.3)
SODIUM: 139 meq/L (ref 137–147)

## 2013-06-19 LAB — GLUCOSE, CAPILLARY: Glucose-Capillary: 101 mg/dL — ABNORMAL HIGH (ref 70–99)

## 2013-06-19 MED ORDER — TIOTROPIUM BROMIDE MONOHYDRATE 18 MCG IN CAPS: 18.0000 ug | ORAL_CAPSULE | Freq: Every morning | RESPIRATORY_TRACT | Status: DC

## 2013-06-19 MED ORDER — LEVALBUTEROL HCL 0.63 MG/3ML IN NEBU: 0.6300 mg | INHALATION_SOLUTION | Freq: Four times a day (QID) | RESPIRATORY_TRACT | Status: DC | PRN

## 2013-06-19 MED ORDER — METOPROLOL TARTRATE 50 MG PO TABS: 75.0000 mg | ORAL_TABLET | Freq: Two times a day (BID) | ORAL | Status: DC

## 2013-06-19 MED ORDER — NITROGLYCERIN 0.4 MG SL SUBL: 0.4000 mg | SUBLINGUAL_TABLET | SUBLINGUAL | Status: DC | PRN

## 2013-06-19 MED ORDER — LEVALBUTEROL TARTRATE 45 MCG/ACT IN AERO: 2.0000 | INHALATION_SPRAY | Freq: Four times a day (QID) | RESPIRATORY_TRACT | Status: DC | PRN

## 2013-06-19 MED ORDER — ROSUVASTATIN CALCIUM 40 MG PO TABS: 40.0000 mg | ORAL_TABLET | Freq: Every day | ORAL | Status: DC

## 2013-06-19 MED ORDER — LISINOPRIL 2.5 MG PO TABS: 2.5000 mg | ORAL_TABLET | Freq: Every day | ORAL | Status: DC

## 2013-06-19 MED ORDER — TIOTROPIUM BROMIDE MONOHYDRATE 18 MCG IN CAPS
18.0000 ug | ORAL_CAPSULE | Freq: Every day | RESPIRATORY_TRACT | Status: DC
Start: 2013-06-19 — End: 2013-06-19
  Filled 2013-06-19: qty 5

## 2013-06-19 MED ORDER — TICAGRELOR 90 MG PO TABS: 90.0000 mg | ORAL_TABLET | Freq: Two times a day (BID) | ORAL | Status: DC

## 2013-06-19 MED ORDER — ACETAMINOPHEN 325 MG PO TABS: 650.0000 mg | ORAL_TABLET | ORAL | Status: DC | PRN

## 2013-06-19 MED ORDER — ENSURE COMPLETE PO LIQD
237.0000 mL | Freq: Two times a day (BID) | ORAL | Status: DC
Start: 2013-06-19 — End: 2013-08-30

## 2013-06-19 MED ORDER — ASPIRIN 81 MG PO CHEW: 81.0000 mg | CHEWABLE_TABLET | Freq: Every day | ORAL | Status: DC

## 2013-06-19 NOTE — Discharge Instructions (Signed)
Call [facility] at [phone] if any bleeding, swelling or drainage at cath site.  May shower, no tub baths for 48 hours for groin sticks.   No driving until cleared by Dr. Claiborne Billings  No lifting over 10 pounds.  Take 1 NTG, under your tongue, while sitting.  If no relief of pain may repeat NTG, one tab every 5 minutes up to 3 tablets total over 15 minutes.  If no relief CALL 911.  If you have dizziness/lightheadness  while taking NTG, stop taking and call 911.        Call if chest pain occurs.   We are having RN to visit you and Social Worker to help you find Patent examiner.  Heart Healthy diet, use ensure.    Stop smoking  We will schedule you for an ultrasound of your heart an Echo to evaluate how well it pumps.  Home Health Services arranged with [facility]. [phone]. Registered Nurse and Education officer, [employer].     Chest Pain (Nonspecific) It is often hard to give a specific diagnosis for the cause of chest pain. There is always a chance that your pain could be related to something serious, such as a heart attack or a blood clot in the lungs. You need to follow up with your caregiver for further evaluation. CAUSES   Heartburn.  Pneumonia or bronchitis.  Anxiety or stress.  Inflammation around your heart (pericarditis) or lung (pleuritis or pleurisy).  A blood clot in the lung.  A collapsed lung (pneumothorax). It can develop suddenly on its own (spontaneous pneumothorax) or from injury (trauma) to the chest.  Shingles infection (herpes zoster virus). The chest wall is composed of bones, muscles, and cartilage. Any of these can be the source of the pain.  The bones can be bruised by injury.  The muscles or cartilage can be strained by coughing or overwork.  The cartilage can be affected by inflammation and become sore (costochondritis). DIAGNOSIS  Lab tests or other studies, such as X-rays, electrocardiography, stress testing, or cardiac  imaging, may be needed to find the cause of your pain.  TREATMENT   Treatment depends on what may be causing your chest pain. Treatment may include:  Acid blockers for heartburn.  Anti-inflammatory medicine.  Pain medicine for inflammatory conditions.  Antibiotics if an infection is present.  You may be advised to change lifestyle habits. This includes stopping smoking and avoiding alcohol, caffeine, and chocolate.  You may be advised to keep your head raised (elevated) when sleeping. This reduces the chance of acid going backward from your stomach into your esophagus.  Most of the time, nonspecific chest pain will improve within 2 to 3 days with rest and mild pain medicine. HOME CARE INSTRUCTIONS   If antibiotics were prescribed, take your antibiotics as directed. Finish them even if you start to feel better.  For the next few days, avoid physical activities that bring on chest pain. Continue physical activities as directed.  Do not smoke.  Avoid drinking alcohol.  Only take over-the-counter or prescription medicine for pain, discomfort, or fever as directed by your caregiver.  Follow your caregiver's suggestions for further testing if your chest pain does not go away.  Keep any follow-up appointments you made. If you do not go to an appointment, you could develop lasting (chronic) problems with pain. If there is any problem keeping an appointment, you must call to reschedule. SEEK MEDICAL CARE IF:   You think you are having problems  from the medicine you are taking. Read your medicine instructions carefully.  Your chest pain does not go away, even after treatment.  You develop a rash with blisters on your chest. SEEK IMMEDIATE MEDICAL CARE IF:   You have increased chest pain or pain that spreads to your arm, neck, jaw, back, or abdomen.  You develop shortness of breath, an increasing cough, or you are coughing up blood.  You have severe back or abdominal pain, feel  nauseous, or vomit.  You develop severe weakness, fainting, or chills.  You have a fever. THIS IS AN EMERGENCY. Do not wait to see if the pain will go away. Get medical help at once. Call your local emergency services (911 in [place] Do not drive yourself to the hospital. MAKE SURE YOU:   Understand these instructions.  Will watch your condition.  Will get help right away if you are not doing well or get worse. Document Released: 01/28/2005 Document Revised: 07/13/2011 Document Reviewed: 11/24/2007 [facility] Patient Information 2014 [place].  Chronic Obstructive Pulmonary Disease Exacerbation  Chronic obstructive pulmonary disease (COPD) is a common lung problem. In COPD, the flow of air from the lungs is limited. COPD exacerbations are times that breathing gets worse and you need extra treatment. Without treatment they can be life threatening. If they happen often, your lungs can become more damaged. HOME CARE  Do not smoke.  Avoid tobacco smoke and other things that bother your lungs.  If given, take your antibiotic medicine as told. Finish the medicine even if you start to feel better.  Only take medicines as told by your doctor.  Drink enough fluids to keep your pee (urine) clear or pale yellow (unless your doctor has told you not to).  Use a cool mist machine (vaporizer).  If you use oxygen or a machine that turns liquid medicine into a mist (nebulizer), continue to use them as told.  Keep up with shots (vaccinations) as told by your doctor.  Exercise regularly.  Eat healthy foods.  Keep all doctor visits as told. GET HELP RIGHT AWAY IF:  You are very short of breath and it gets worse.  You have trouble talking.  You have bad chest pain.  You have blood in your spit (sputum).  You have a fever.  You keep throwing up (vomiting).  You feel weak, or you pass out (faint).  You feel confused.  You keep getting worse. MAKE SURE YOU:   Understand  these instructions.  Will watch your condition.  Will get help right away if you are not doing well or get worse. Document Released: 04/09/2011 Document Revised: 02/08/2013 Document Reviewed: 12/23/2012 [facility] Patient Information 2014 [place].   Myocardial Infarction A myocardial infarction (MI) is also called a heart attack. It causes damage to the heart that cannot be fixed. An MI often happens when a blood clot or other blockage cuts blood flow to the heart. When this happens, certain areas of the heart begin to die. This is an emergency. HOME CARE  Take medicine as told by your doctor.  Change certain behaviors as told by your doctor. This may include:  Quitting smoking.  Being active.  Keeping a healthy weight.  Eating a heart-healthy diet. Ask your doctor for help with this diet.  Keeping your diabetes under control.  Lessening stress.  Limiting how much alcohol you drink. GET HELP RIGHT AWAY IF:  You have crushing or pressure-like chest pain that spreads to the arms, back, neck, or  jaw. Call your local emergency services (911 in [place] Do not drive yourself to the hospital.  You have severe chest pain.  You have shortness of breath during rest, sleep, or with activity.  You have sudden sweating or clammy skin.  You feel sick to your stomach (nauseous) and throw up (vomit).  You suddenly get lightheaded or dizzy.  You feel your heart beating fast or skipping beats. MAKE SURE YOU:   Understand these instructions.  Will watch your condition.  Will get help right away if you are not doing well or get worse. Document Released: 10/20/2011 Document Reviewed: 10/20/2011 [facility] Patient Information 2014 [place], [place].  Smoking Cessation, Tips for Success If you are ready to quit smoking, congratulations! You have chosen to help yourself be healthier. Cigarettes bring nicotine, tar, carbon monoxide, and other irritants into your body. Your lungs,  heart, and blood vessels will be able to work better without these poisons. There are many different ways to quit smoking. Nicotine gum, nicotine patches, a nicotine inhaler, or nicotine nasal spray can help with physical craving. Hypnosis, support groups, and medicines help break the habit of smoking. WHAT THINGS CAN I DO TO MAKE QUITTING EASIER?  Here are some tips to help you quit for good:  Pick a date when you will quit smoking completely. Tell all of your friends and family about your plan to quit on that date.  Do not try to slowly cut down on the number of cigarettes you are smoking. Pick a quit date and quit smoking completely starting on that day.  Throw away all cigarettes.   Clean and remove all ashtrays from your home, work, and car.   On a card, write down your reasons for quitting. Carry the card with you and read it when you get the urge to smoke.   Cleanse your body of nicotine. Drink enough water and fluids to keep your urine clear or pale yellow. Do this after quitting to flush the nicotine from your body.   Learn to predict your moods. Do not let a bad situation be your excuse to have a cigarette. Some situations in your life might tempt you into wanting a cigarette.   Never have "just one" cigarette. It leads to wanting another and another. Remind yourself of your decision to quit.   Change habits associated with smoking. If you smoked while driving or when feeling stressed, try other activities to replace smoking. Stand up when drinking your coffee. Brush your teeth after eating. Sit in a different chair when you read the paper. Avoid alcohol while trying to quit, and try to drink fewer caffeinated beverages. Alcohol and caffeine may urge you to smoke.   Avoid foods and drinks that can trigger a desire to smoke, such as sugary or spicy foods and alcohol.   Ask people who smoke not to smoke around you.   Have something planned to do right after eating or having  a cup of coffee. For example, plan to take a walk or exercise.   Try a relaxation exercise to calm you down and decrease your stress. Remember, you may be tense and nervous for the first 2 weeks after you quit, but this will pass.   Find new activities to keep your hands busy. Play with a pen, coin, or rubber band. Doodle or draw things on paper.   Brush your teeth right after eating. This will help cut down on the craving for the taste of tobacco after meals. You  can also try mouthwash.   Use oral substitutes in place of cigarettes. Try using lemon drops, carrots, cinnamon sticks, or chewing gum. Keep them handy so they are available when you have the urge to smoke.   When you have the urge to smoke, try deep breathing.   Designate your home as a nonsmoking area.   If you are a heavy smoker, ask your health care provider about a prescription for nicotine chewing gum. It can ease your withdrawal from nicotine.   Reward yourself. Set aside the cigarette money you save and buy yourself something nice.   Look for support from others. Join a support group or smoking cessation program. Ask someone at home or at work to help you with your plan to quit smoking.   Always ask yourself, "Do I need this cigarette or is this just a reflex?" Tell yourself, "Today, I choose not to smoke," or "I do not want to smoke." You are reminding yourself of your decision to quit.  Do not replace cigarette smoking with electronic cigarettes (commonly called e-cigarettes). The safety of e-cigarettes is unknown, and some may contain harmful chemicals.  If you relapse, do not give up! Plan ahead and think about what you will do the next time you get the urge to smoke.  HOW WILL I FEEL WHEN I QUIT SMOKING? You may have symptoms of withdrawal because your body is used to nicotine (the addictive substance in cigarettes). You may crave cigarettes, be irritable, feel very hungry, cough often, get headaches, or  have difficulty concentrating. The withdrawal symptoms are only temporary. They are strongest when you first quit but will go away within 10 14 days. When withdrawal symptoms occur, stay in control. Think about your reasons for quitting. Remind yourself that these are signs that your body is healing and getting used to being without cigarettes. Remember that withdrawal symptoms are easier to treat than the major diseases that smoking can cause.  Even after the withdrawal is over, expect periodic urges to smoke. However, these cravings are generally short lived and will go away whether you smoke or not. Do not smoke!  WHAT RESOURCES ARE AVAILABLE TO HELP ME QUIT SMOKING? Your health care provider can direct you to community resources or hospitals for support, which may include:  Group support.  Education.  Hypnosis.  Therapy. Document Released: 01/17/2004 Document Revised: 02/08/2013 Document Reviewed: 10/06/2012 [facility] Patient Information 2014 [place], [place].  Smoking Cessation Quitting smoking is important to your health and has many advantages. However, it is not always easy to quit since nicotine is a very addictive drug. Often times, people try 3 times or more before being able to quit. This document explains the best ways for you to prepare to quit smoking. Quitting takes hard work and a lot of effort, but you can do it. ADVANTAGES OF QUITTING SMOKING  You will live longer, feel better, and live better.  Your body will feel the impact of quitting smoking almost immediately.  Within 20 minutes, blood pressure decreases. Your pulse returns to its normal level.  After 8 hours, carbon monoxide levels in the blood return to normal. Your oxygen level increases.  After 24 hours, the chance of having a heart attack starts to decrease. Your breath, hair, and body stop smelling like smoke.  After 48 hours, damaged nerve endings begin to recover. Your sense of taste and smell  improve.  After 72 hours, the body is virtually free of nicotine. Your bronchial tubes relax and  breathing becomes easier.  After 2 to 12 weeks, lungs can hold more air. Exercise becomes easier and circulation improves.  The risk of having a heart attack, stroke, cancer, or lung disease is greatly reduced.  After 1 year, the risk of coronary heart disease is cut in half.  After 5 years, the risk of stroke falls to the same as a nonsmoker.  After 10 years, the risk of lung cancer is cut in half and the risk of other cancers decreases significantly.  After 15 years, the risk of coronary heart disease drops, usually to the level of a nonsmoker.  If you are pregnant, quitting smoking will improve your chances of having a healthy baby.  The people you live with, especially any children, will be healthier.  You will have extra money to spend on things other than cigarettes. QUESTIONS TO THINK ABOUT BEFORE ATTEMPTING TO QUIT You may want to talk about your answers with your caregiver.  Why do you want to quit?  If you tried to quit in the past, what helped and what did not?  What will be the most difficult situations for you after you quit? How will you plan to handle them?  Who can help you through the tough times? Your family? Friends? A caregiver?  What pleasures do you get from smoking? What ways can you still get pleasure if you quit? Here are some questions to ask your caregiver:  How can you help me to be successful at quitting?  What medicine do you think would be best for me and how should I take it?  What should I do if I need more help?  What is smoking withdrawal like? How can I get information on withdrawal? GET READY  Set a quit date.  Change your environment by getting rid of all cigarettes, ashtrays, matches, and lighters in your home, car, or work. Do not let people smoke in your home.  Review your past attempts to quit. Think about what worked and what did  not. GET SUPPORT AND ENCOURAGEMENT You have a better chance of being successful if you have help. You can get support in many ways.  Tell your family, friends, and co-workers that you are going to quit and need their support. Ask them not to smoke around you.  Get individual, group, or telephone counseling and support. Programs are available at [employer] and health centers. Call your local health department for information about programs in your area.  Spiritual beliefs and practices may help some smokers quit.  Download a "quit meter" on your computer to keep track of quit statistics, such as how long you have gone without smoking, cigarettes not smoked, and money saved.  Get a self-help book about quitting smoking and staying off of tobacco. [place] yourself from urges to smoke. Talk to someone, go for a walk, or occupy your time with a task.  Change your normal routine. Take a different route to work. Drink tea instead of coffee. Eat breakfast in a different place.  Reduce your stress. Take a hot bath, exercise, or read a book.  Plan something enjoyable to do every day. Reward yourself for not smoking.  Explore interactive web-based programs that specialize in helping you quit. GET MEDICINE AND USE IT CORRECTLY Medicines can help you stop smoking and decrease the urge to smoke. Combining medicine with the above behavioral methods and support can greatly increase your chances of successfully quitting smoking.  Nicotine  replacement therapy helps deliver nicotine to your body without the negative effects and risks of smoking. Nicotine replacement therapy includes nicotine gum, lozenges, inhalers, nasal sprays, and skin patches. Some may be available over-the-counter and others require a prescription.  Antidepressant medicine helps people abstain from smoking, but how this works is unknown. This medicine is available by prescription.  Nicotinic  receptor partial agonist medicine simulates the effect of nicotine in your brain. This medicine is available by prescription. Ask your caregiver for advice about which medicines to use and how to use them based on your health history. Your caregiver will tell you what side effects to look out for if you choose to be on a medicine or therapy. Carefully read the information on the package. Do not use any other product containing nicotine while using a nicotine replacement product.  RELAPSE OR DIFFICULT SITUATIONS Most relapses occur within the first 3 months after quitting. Do not be discouraged if you start smoking again. Remember, most people try several times before finally quitting. You may have symptoms of withdrawal because your body is used to nicotine. You may crave cigarettes, be irritable, feel very hungry, cough often, get headaches, or have difficulty concentrating. The withdrawal symptoms are only temporary. They are strongest when you first quit, but they will go away within 10 14 days. To reduce the chances of relapse, try to:  Avoid drinking alcohol. Drinking lowers your chances of successfully quitting.  Reduce the amount of caffeine you consume. Once you quit smoking, the amount of caffeine in your body increases and can give you symptoms, such as a rapid heartbeat, sweating, and anxiety.  Avoid smokers because they can make you want to smoke.  Do not let weight gain distract you. Many smokers will gain weight when they quit, usually less than 10 pounds. Eat a healthy diet and stay active. You can always lose the weight gained after you quit.  Find ways to improve your mood other than smoking. FOR MORE INFORMATION  www.smokefree.gov  Document Released: 04/14/2001 Document Revised: 10/20/2011 Document Reviewed: 07/30/2011 [facility] Patient Information 2014 [place], [place].  You Can Quit Smoking If you are ready to quit smoking or are thinking about it, congratulations! You have  chosen to help yourself be healthier and live longer! There are lots of different ways to quit smoking. Nicotine gum, nicotine patches, a nicotine inhaler, or nicotine nasal spray can help with physical craving. Hypnosis, support groups, and medicines help break the habit of smoking. TIPS TO GET OFF AND STAY OFF CIGARETTES  Learn to predict your moods. Do not let a bad situation be your excuse to have a cigarette. Some situations in your life might tempt you to have a cigarette.  Ask friends and co-workers not to smoke around you.  Make your home smoke-free.  Never have "just one" cigarette. It leads to wanting another and another. Remind yourself of your decision to quit.  On a card, make a list of your reasons for not smoking. Read it at least the same number of times a day as you have a cigarette. Tell yourself everyday, "I do not want to smoke. I choose not to smoke."  Ask someone at home or work to help you with your plan to quit smoking.  Have something planned after you eat or have a cup of coffee. Take a walk or get other exercise to perk you up. This will help to keep you from overeating.  Try a relaxation exercise to calm you down  and decrease your stress. Remember, you may be tense and nervous the first two weeks after you quit. This will pass.  Find new activities to keep your hands busy. Play with a pen, coin, or rubber band. Doodle or draw things on paper.  Brush your teeth right after eating. This will help cut down the craving for the taste of tobacco after meals. You can try mouthwash too.  Try gum, breath mints, or diet candy to keep something in your mouth. IF YOU SMOKE AND WANT TO QUIT:  Do not stock up on cigarettes. Never buy a carton. Wait until one pack is finished before you buy another.  Never carry cigarettes with you at work or at home.  Keep cigarettes as far away from you as possible. Leave them with someone else.  Never carry matches or a lighter with  you.  Ask yourself, "Do I need this cigarette or is this just a reflex?"  Bet with someone that you can quit. Put cigarette money in a piggy bank every morning. If you smoke, you give up the money. If you do not smoke, by the end of the week, you keep the money.  Keep trying. It takes 21 days to change a habit!  Talk to your doctor about using medicines to help you quit. These include nicotine replacement gum, lozenges, or skin patches. Document Released: 02/14/2009 Document Revised: 07/13/2011 Document Reviewed: 02/14/2009 [facility] Patient Information 2014 [place].

## 2013-06-19 NOTE — Care Management Note (Addendum)
    Page 1 of 2   06/19/2013     12:29:22 PM   CARE MANAGEMENT NOTE 06/19/2013  Patient:  Timothy [name], Timothy [name]   Account Number:  [id number]  Date Initiated:  06/18/2013  Documentation initiated by:  [facility]  Subjective/Objective Assessment:   adm: Cardiac arrest (VT)     Action/Plan:   discharge planning; possible MATCH at discharge   Anticipated DC Date:  06/19/2013   Anticipated DC Plan:  [place]  CM consult  Medication Assistance      Choice offered to / List presented to:  C-1 Patient        [place] arranged  HH-1 RN  [facility]      [place].   Status of service:  Completed, signed off Medicare Important Message given?   (If response is "NO", the following Medicare IM given date fields will be blank) Date Medicare IM given:   Date Additional Medicare IM given:    Discharge Disposition:  [place]  Per UR Regulation:  Reviewed for med. necessity/level of care/duration of stay  If discussed at [place] of Stay Meetings, dates discussed:    Comments:   Pt agreeable to [facility] services with [facility] and SOC to begin within 24-48 hrs post d/c. Jacqlyn Krauss, RN, BSN [phone].  06-19-13 [address] [place] [phone] CM to speak to pt in reference to medications. Plan PEr MD is to change lipitor to crestor- CM will provide pt with 30 day free card for lipitor and pt already has brilinta card for 30 day free. MD to fill out Patient assistance forms and pt to fax into company for possible approval for year supply. CM will make pt aware of [facility] $4.00 med list. CIR to look at pt- if not a candidate for CIR pt will benefit from [facility] / SW services for Medication management and community resources. No further needs at this time.    06/18/13 13:30 CM spoke with pt who was concerned bc today is the last day of Open Enrollment and he has not  secured insurance.  CM brought pt [facility] handout so he could call and make an appt with a navigator for insurance.  CM also gave pt handout for PCPs and circled [place] for him to call and make an appt for an orange card.  Pt is very organized and has all the information he needs to obtain insurance in a folder with him.  Pt may need a MATCH letter upon discharge depending on the medications ordered.  CM will continue to follow for disposition needs. Mariane Masters, BSN, CM [phone].

## 2013-06-19 NOTE — Progress Notes (Addendum)
         Subjective: Only with chest wall pain,  HR improved in upper 90s on lopressor 75 bid.   Ambulate.  Objective: Vital signs in last 24 hours: Temp:  [97.6 F (36.4 C)-98.1 F (36.7 C)] 97.7 F (36.5 C) (02/16 0612) Pulse Rate:  [65-106] 65 (02/16 0612) Resp:  [18] 18 (02/16 0612) BP: (105-126)/(65-78) 105/65 mmHg (02/16 0612) SpO2:  [91 %-95 %] 95 % (02/16 0612) Weight change:  Last BM Date: 06/17/13 Intake/Output from previous day: +540 02/15 0701 - 02/16 0700 In: 540 [P.O.:540] Out: -  Intake/Output this shift:    PE: General:Pleasant affect, NAD Skin:Warm and dry, brisk capillary refill HEENT:normocephalic, sclera clear, mucus membranes moist Heart:S1S2 RRR without murmur, gallup, rub or click Lungs:clear without rales, rhonchi, or wheezes WUX:LKGM, non tender, + BS, do not palpate liver spleen or masses Ext:no lower ext edema, 2+ pedal pulses, 2+ radial pulses Neuro:alert and oriented, MAE, follows commands, + facial symmetry   Lab Results: No results found for this basename: WBC, HGB, HCT, PLT,  in the last 72 hours BMET No results found for this basename: NA, K, CL, CO2, GLUCOSE, BUN, CREATININE, CALCIUM, MAGNESIUM,  in the last 72 hours No results found for this basename: TROPONINI, CK, MB,  in the last 72 hours  Lab Results  Component Value Date   CHOL 132 06/12/2013   HDL 36* 06/12/2013   LDLCALC 77 06/12/2013   TRIG 94 06/12/2013   CHOLHDL 3.7 06/12/2013   Lab Results  Component Value Date   HGBA1C 5.9* 06/12/2013     No results found for this basename: TSH       Studies/Results: No results found.  Medications: I have reviewed the patient's current medications. Scheduled Meds: . aspirin  81 mg Oral Daily  . atorvastatin  80 mg Oral q1800  . budesonide (PULMICORT) nebulizer solution  0.5 mg Nebulization BID  . enoxaparin (LOVENOX) injection  40 mg Subcutaneous Q24H  . feeding supplement (ENSURE COMPLETE)  237 mL Oral BID BM  .  ipratropium-albuterol  3 mL Nebulization QID  . lisinopril  2.5 mg Oral Daily  . metoprolol tartrate  75 mg Oral BID  . Ticagrelor  90 mg Oral BID   Continuous Infusions: . sodium chloride Stopped (06/16/13 1319)   PRN Meds:.sodium chloride, acetaminophen, albuterol, ondansetron (ZOFRAN) IV  Assessment/Plan: Principal Problem:   Cardiac arrest Active Problems:   VT (ventricular tachycardia)   COPD exacerbation   NSTEMI (non-ST elevated myocardial infarction)  PLAN: s/p vt/vf arrest, 2 shocks, NSTEMI with lateral wall changes, DES  To RCA.  Desat with ambulation on Sat.  Will check again today.   EF 50-55% at cath.  ? [place] today or tomorrow.   LOS: 7 days   Time spent with pt. :15 minutes. [facility] R  Nurse Practitioner Certified Pager [phone] or after 5pm and on weekends call [phone] 06/19/2013, 8:20 AM   I have examined the patient and reviewed assessment and plan and discussed with patient.  Agree with above as stated.  No groin hematoma. 2+ PT pulses bilaterally.  If he can walk this morning with the nurse and not have any desaturations, will plan on discharge later today.  Needs dual antiplatelet therapy for at least a year.  Aggressive secondary prevention.   Kita Neace S.

## 2013-06-19 NOTE — Progress Notes (Signed)
Reviewed discharge instructions with patient and family, they stated their understanding.  Prescriptions and medications reviewed with patient and family. Discharged home with family.  Sanda Linger

## 2013-06-19 NOTE — Discharge Summary (Addendum)
Physician Discharge Summary       Patient ID: Timothy [name] MRN: [id number] DOB/AGE: [birth year] 58 y.o.  Admit date: 06/12/2013 Discharge date: 06/19/2013  Discharge Diagnoses:  Principal Problem:   Cardiac arrest, hypothermic protocol, vfib/vtach.  Resp arrest Active Problems:   VT (ventricular tachycardia), requiring 2 shocks   NSTEMI (non-ST elevated myocardial infarction), DES to RCA   CAD (coronary artery disease)   Altered mental status, improved at discharge   COPD exacerbation   Hypokalemia, replaced   Hypomagnesemia, replaced   Hyperlipidemia LDL goal < 70   Hypotension, requiring levophed   Bronchitis, chronic   Tobacco abuse   Discharged Condition: good  Procedures: 06/12/13 cardiac cath post cardiac/resp arrest with DES stent to  RCA Xience Alpine by Dr. Claiborne Billings.   Hospital Course:  26 M who never went to a doctor, not on any meds, he has baseline dyspnea and chronic productive cough (chronic bronchitis), apparently had been worsening for 2 weeks, did no go to a doctor still. He was also complaining of acute chest pain 06/12/12  a few hours prior to being found down. He was last seen normal about an hour before being found unresponsive by family members, pulseless. EMS arrived and CPR started x 2 minutes, got shocked x 2 for VT. No epi given, had ROSC. Amio 150 mg given x 1. King airway at the field, ET tube placed in ED.   He did have a fever 3 days ago.   EKG in ED showed some ST wave depression on the lateral leads. CXR showed hyperinflation without acute opacity.  Hypothermic protocol was started and pt was taken to the cath lab emergently  to eval ischemia/infarction mediative VF arrest.   CATH Revealed:  1. Left main: Angiographic a normal vessel which bifurcated into the LAD and LCx.  2. LAD: Angiographic normal vessel which gave rise to a large prominent first diagonal vessel and septal perforating artery and extended to the LV apex.  3. Left circumflex:  Angiographically large vessel which gave rise to 2 marginal vessels and in the posterior lateral coronary artery.  4. Right coronary artery: Totally occluded in its midsegment at the takeoff of an anterior marginal branch  Left ventriculography reveals an ejection fraction of 50-55% with mild inferior hypocontractility. There is no evidence for mitral regurgitation.   Pt underwent Successful acute intervention to the totally occluded RCA with PTCA/stenting with insertion of a 2.5x18 mm Xience Alpine DES stent.   He did have hypotension and was on Levophed for 24 hours.  Continued on hypothermic protocol.  Central line was also placed by CCM.  For bronchitic disease he was given antibiotics which were completed during the hospitalization.  He did receive steroids initially.  He is discharged with spiriva and xopenex inhaler. Did not use albuterol due to Sinus tach.  Altered Mental Status CT of head without acute issues.   Will have CIR complete eval for chance of admit to CIR.  Oxygen desat:  Pt desated to the 80s 2 days prior to discharge but room air with walking prior to discharge was 93-92% no SOB.  Sinus Tach.  BB was increased due to continued S tach.  Both with ambulation and post albuterol.  I have placed on Xopenex inhaler along with spiriva.  At rest HR 95-105.  Pt could not afford lipitor so now on crestor with 30 days free and papers filled out for assistance with both Crestor and Brilinta.  30 day free card for Brilinta  as well.  If pt does not go to CIR, I have arranged home health RN and Education officer, museum.    Pt was seen and evaluated by Dr. Irish Lack and found stable for discharge.  He was counseled on tobacco cessation.    Consults: rehabilitation medicine, critical care medicine and cardiology  Significant Diagnostic Studies:   BMET    Component Value Date/Time   NA 139 06/19/2013 1040   K 4.7 06/19/2013 1040   CL 101 06/19/2013 1040   CO2 22 06/19/2013 1040   GLUCOSE 102*  06/19/2013 1040   BUN 22 06/19/2013 1040   CREATININE 0.68 06/19/2013 1040   CALCIUM 9.4 06/19/2013 1040   GFRNONAA >90 06/19/2013 1040   GFRAA >90 06/19/2013 1040    CBC    Component Value Date/Time   WBC 15.4* 06/19/2013 1040   RBC 5.06 06/19/2013 1040   HGB 15.9 06/19/2013 1040   HCT 45.6 06/19/2013 1040   PLT 397 06/19/2013 1040   MCV 90.1 06/19/2013 1040   MCH 31.4 06/19/2013 1040   MCHC 34.9 06/19/2013 1040   RDW 13.6 06/19/2013 1040   LYMPHSABS 1.3 06/12/2013 0638   MONOABS 2.7* 06/12/2013 0638   EOSABS 0.0 06/12/2013 0638   BASOSABS 0.0 06/12/2013 0638    Pk troponin 5.76 decreasing prior to discharge.  T chol 132, TG 94, HDL 36, LDL 77 Drug screen + tetrahydrocannabinol and Benzo  Urine culture no growth  CXR at discharge: FINDINGS: The heart size and mediastinal contours are within normal limits. There is increased density projects in region right middle lobe. No further focal regions of consolidation or focal infiltrates appreciated. There is trace blunting of the left costophrenic angle. The osseous structures unremarkable.  IMPRESSION: Atelectasis versus infiltrate right middle lobe. Findings consistent with a trace left pleural effusion   Discharge Exam: Blood pressure 105/65, pulse 65, temperature 97.7 F (36.5 C), temperature source Oral, resp. rate 18, height 5\' 6"  (1.676 m), weight 134 lb 0.6 oz (60.8 kg), SpO2 94.00%.  Disposition: home      Discharge Orders   Future Orders Complete By Expires   Amb Referral to Cardiac Rehabilitation  As directed    Face-to-face encounter (required for Medicare/Medicaid patients)  As directed    Comments:     I INGOLD,LAURA R certify that this patient is under my care and that I, or a nurse practitioner or physician's assistant working with me, had a face-to-face encounter that meets the physician face-to-face encounter requirements with this patient on 06/19/2013. The encounter with the patient was in whole, or in part for the  following medical condition(s) which is the primary reason for home health care (List medical condition): Cardiac and Resp. Arrest with hypothermic protocol.  NSTEMI, requiring cardiac cath and stent to  RCA.  Cognitive dysfunction post arrest, improving.   Questions:     The encounter with the patient was in whole, or in part, for the following medical condition, which is the primary reason for home health care:  cardiac arrest, resp. arrest, myocardical infarction   I certify that, based on my findings, the following services are medically necessary home health services:  Nursing   My clinical findings support the need for the above services:  Unable to leave home safely without assistance and/or assistive device   Further, I certify that my clinical findings support that this patient is homebound due to:  Unable to leave home safely without assistance   Reason for Medically Necessary Home Health Services:  Skilled Nursing- Changes in Medication/Medication Management   Skilled Nursing- Skilled [place]  As directed    Scheduling Instructions:     Some cognitive disorder with poor memory post cardiac arrest.   Questions:     To provide the following care/treatments:  RN   Social work       Medication List         acetaminophen 325 MG tablet  Commonly known as:  TYLENOL  Take 2 tablets (650 mg total) by mouth every 4 (four) hours as needed for headache or mild pain.     aspirin 81 MG chewable tablet  Chew 1 tablet (81 mg total) by mouth daily.     feeding supplement (ENSURE COMPLETE) Liqd  Take 237 mLs by mouth 2 (two) times daily between meals.     levalbuterol 45 MCG/ACT inhaler  Commonly known as:  XOPENEX HFA  Inhale 2 puffs into the lungs every 6 (six) hours as needed for wheezing.     lisinopril 2.5 MG tablet  Commonly known as:  PRINIVIL,ZESTRIL  Take 1 tablet (2.5 mg total) by mouth daily.     metoprolol 50 MG tablet  Commonly known as:   LOPRESSOR  Take 1.5 tablets (75 mg total) by mouth 2 (two) times daily.     nitroGLYCERIN 0.4 MG SL tablet  Commonly known as:  NITROSTAT  Place 1 tablet (0.4 mg total) under the tongue every 5 (five) minutes as needed for chest pain.     rosuvastatin 40 MG tablet  Commonly known as:  CRESTOR  Take 1 tablet (40 mg total) by mouth daily.     Ticagrelor 90 MG Tabs tablet  Commonly known as:  BRILINTA  Take 1 tablet (90 mg total) by mouth 2 (two) times daily.     tiotropium 18 MCG inhalation capsule  Commonly known as:  SPIRIVA HANDIHALER  Place 1 capsule (18 mcg total) into inhaler and inhale every morning.       Follow-up Information   Follow up with Lyda Jester, PA-C On 06/27/2013. (Dr. Evette Georges PA, to see you at 3:00PM )    Specialty:  Cardiology   Contact information:   [address] [place] [address] [phone]       Follow up with Troy Sine, MD On 07/10/2013. (at 11:15 AM )    Specialty:  Cardiology   Contact information:   [address] [place] [address] [phone]        Discharge Instructions: Call [facility] at [phone] if any bleeding, swelling or drainage at cath site.  May shower, no tub baths for 48 hours for groin sticks.   No driving until cleared by Dr. Claiborne Billings  No lifting over 10 pounds.  Take 1 NTG, under your tongue, while sitting.  If no relief of pain may repeat NTG, one tab every 5 minutes up to 3 tablets total over 15 minutes.  If no relief CALL 911.  If you have dizziness/lightheadness  while taking NTG, stop taking and call 911.        Call if chest pain occurs.   We are having RN to visit you and Social Worker to help you find Patent examiner.  Heart Healthy diet, use ensure.    Stop smoking.  We will schedule you for an ultrasound of your heart an Echo to evaluate how well it pumps. Signed: Isaiah Serge Nurse Practitioner-Certified [facility]:  [facility] 06/19/2013,  11:25 AM  Time spent on discharge : 45 minutes, assessing exercise tolerance and coming up with disposition..    I have examined the patient and reviewed assessment and plan and discussed with patient.  Agree with above as stated.  Walking without sx.  Needs dual antiplatelet therapy for at least one year.  F/U with Dr. Claiborne Billings.  Time spent on discharge : 45 minutes, assessing exercise tolerance and coming up with disposition. Jazariah Teall S.

## 2013-06-19 NOTE — Progress Notes (Signed)
I met with pt at bedside. Patient has made good progress functionally. Recommend home health follow up at d/c. I discussed with RN CM. [phone]

## 2013-06-19 NOTE — Progress Notes (Signed)
CARDIAC REHAB PHASE I   PRE:  Rate/Rhythm: 108 ST    BP: sitting 100/70    SaO2: 91 RA  MODE:  Ambulation: 550 ft   POST:  Rate/Rhythm: 125 ST    BP: sitting 120/80     SaO2: 92 RA  Steady, no LOB noted with simple ambulation. HR elevated, asymptomatic. SaO2 improved. Encouraged practicing IS. Pt did not remember ed from Saturday. Gave reminders and referred to his paper information. Discussed NTG. Reinforced taking his meds, esp Brilinta. Pt sts he has had swollen lips in the past with 325 ASA. Discussed financial aid app for [employer].  [phone]   Darrick Meigs CES, ACSM 06/19/2013 10:30 AM

## 2013-06-22 ENCOUNTER — Telehealth (HOSPITAL_COMMUNITY): Payer: Self-pay | Admitting: *Deleted

## 2013-06-22 ENCOUNTER — Telehealth: Payer: Self-pay | Admitting: Cardiovascular Disease

## 2013-06-22 NOTE — Telephone Encounter (Signed)
Timothy [name] has a coupon for his Crestor but it does not cover the 40mg  in which the prescription is written and is wondering if a new prescription for 20mg  can be sent to the pharmacy so that he may get this medication. Please call if you have any questions .Marland Kitchen   Thanks

## 2013-06-23 MED ORDER — ROSUVASTATIN CALCIUM 20 MG PO TABS: 40.0000 mg | ORAL_TABLET | Freq: Every day | ORAL | Status: DC

## 2013-06-23 NOTE — Telephone Encounter (Signed)
Returned call to [place], [facility] nurse w/ [facility].  Informed message received and Rx cannot be changed as pt has not been seen, but samples are available and will be left at front desk.  Informed pt can take until his appt next week.  Verbalized understanding and will inform pt.  Samples left at front desk w/ appt card for appt on Tuesday and directions to take two tabs daily.

## 2013-06-26 ENCOUNTER — Telehealth: Payer: Self-pay | Admitting: Cardiovascular Disease

## 2013-06-26 NOTE — Telephone Encounter (Signed)
Pt is suppose to some in to see [place] today. Would you change his prescription for Crestor to 20 mg  so he can use a coupon. He does not have anything now,because he can not afford it. He is suppose to be also taking Spiriva,he is not taking this because he can not afford it. She just wanted you to be aware of all of this,since he is coming in today.

## 2013-06-26 NOTE — Telephone Encounter (Signed)
Returned call and spoke w/ Timothy [name].  Informed message received and samples were left for pt last week.  Informed her that [name] was informed of this.  Also informed pt's appt is tomorrow at 3pm.  [name] verbalized understanding and stated she will call pt to inform about samples and appt.

## 2013-06-27 ENCOUNTER — Ambulatory Visit: Payer: Self-pay | Admitting: Cardiology

## 2013-07-04 ENCOUNTER — Telehealth (HOSPITAL_COMMUNITY): Payer: Self-pay | Admitting: *Deleted

## 2013-07-10 ENCOUNTER — Encounter: Payer: Self-pay | Admitting: Cardiovascular Disease

## 2013-07-10 ENCOUNTER — Ambulatory Visit (INDEPENDENT_AMBULATORY_CARE_PROVIDER_SITE_OTHER): Payer: Self-pay | Admitting: Cardiovascular Disease

## 2013-07-10 VITALS — BP 118/78 | HR 74 | Ht 66.0 in | Wt 126.9 lb

## 2013-07-10 DIAGNOSIS — I4729 Other ventricular tachycardia: Secondary | ICD-10-CM

## 2013-07-10 DIAGNOSIS — R5381 Other malaise: Secondary | ICD-10-CM

## 2013-07-10 DIAGNOSIS — I214 Non-ST elevation (NSTEMI) myocardial infarction: Secondary | ICD-10-CM

## 2013-07-10 DIAGNOSIS — I469 Cardiac arrest, cause unspecified: Secondary | ICD-10-CM

## 2013-07-10 DIAGNOSIS — E785 Hyperlipidemia, unspecified: Secondary | ICD-10-CM

## 2013-07-10 DIAGNOSIS — I472 Ventricular tachycardia, unspecified: Secondary | ICD-10-CM

## 2013-07-10 DIAGNOSIS — R092 Respiratory arrest: Secondary | ICD-10-CM

## 2013-07-10 DIAGNOSIS — I2510 Atherosclerotic heart disease of native coronary artery without angina pectoris: Secondary | ICD-10-CM

## 2013-07-10 DIAGNOSIS — E782 Mixed hyperlipidemia: Secondary | ICD-10-CM

## 2013-07-10 DIAGNOSIS — R5383 Other fatigue: Secondary | ICD-10-CM

## 2013-07-10 MED ORDER — METOPROLOL TARTRATE 100 MG PO TABS: 100.0000 mg | ORAL_TABLET | Freq: Two times a day (BID) | ORAL | Status: DC

## 2013-07-10 NOTE — Patient Instructions (Addendum)
Your physician has requested that you have en exercise stress myoview. For further information please visit [email or web address]. Please follow instruction sheet, as given.  Your physician has requested that you have an echocardiogram. Echocardiography is a painless test that uses sound waves to create images of your heart. It provides your doctor with information about the size and shape of your heart and how well your heart's chambers and valves are working. This procedure takes approximately one hour. There are no restrictions for this procedure.  Your physician has recommended you make the following change in your medication: increase the lopressor to 100 mg twice daily.  Your physician recommends that you return for lab work fasting prior to your follow up appointment.   Your physician recommends that you schedule a follow-up appointment in: 4 weeks.

## 2013-07-11 ENCOUNTER — Encounter: Payer: Self-pay | Admitting: Cardiovascular Disease

## 2013-07-17 ENCOUNTER — Encounter: Payer: Self-pay | Admitting: Cardiovascular Disease

## 2013-07-17 NOTE — Progress Notes (Signed)
Patient ID: Timothy [name], male   DOB: 10-29-1955, 58 y.o.   MRN: [id number]     HPI: Timothy [name] is a 58 y.o. male who presents for office visit followup of his out of hospital ventricular fibrillation  cardiac arrest and successful cardiac catheterization and percutaneous coronary intervention.  Timothy [name] resented to call hospital in the early morning of 06/12/2013 after being found down at home EMS arrived he was in ventricular fibrillation. He was transported acutely to [facility] hospital right acutely to the cardiac catheterization laboratory for hypothermia protocol was implemented. Emergent catheterization revealed total occlusion of the right artery and he underwent successful intervention with ultimate insertion of a 2.5x18 mm I don't think he has stent post dilated to 2.75 mm percent occlusion reduced to 0% and with resumption of brisk TIMI 3 flow. He ultimately awoke and was intact neurologically after the hypothermia protocol completed his course. He did require levophed initially for hypotension. He was discharged on 08/17/2013. He has not yet returned to work.  Since discharge, he denies recurrent chest pain. He denies PND orthopnea. He was sent home on aspirin and brilinta for dual anti-platelet therapy, metoprolol 75 mg twice a day lisinopril 2.5 mg, Crestor 20 mg. He works as a Dealer. He presents now for evaluation.  Past Medical History  Diagnosis Date  . Hypokalemia, replaced 06/19/2013  . Hypomagnesemia, replaced 06/19/2013  . Cardiac arrest, hypothermic protocol 06/12/2013  . NSTEMI (non-ST elevated myocardial infarction), DES to RCA 06/13/2013  . COPD exacerbation 06/12/2013  . Hyperlipidemia LDL goal < 70 06/19/2013  . Hypotension, requiring levophed 06/19/2013  . Bronchitis, chronic 06/19/2013  . Tobacco abuse 06/19/2013  . Altered mental status, improved at discharge 06/19/2013    Past Surgical History  Procedure Laterality Date  . No past surgeries    . Cardiac sstent    .  Coronary angioplasty with stent placement  06/12/13    Xience Alpine DES to RCA, NSTEMI    Allergies  Allergen Reactions  . Aspirin Swelling    To lips.  . Ibuprofen Swelling    To lips.    Current Outpatient Prescriptions  Medication Sig Dispense Refill  . acetaminophen (TYLENOL) 325 MG tablet Take 2 tablets (650 mg total) by mouth every 4 (four) hours as needed for headache or mild pain.      Marland Kitchen aspirin 81 MG chewable tablet Chew 1 tablet (81 mg total) by mouth daily.      . feeding supplement, ENSURE COMPLETE, (ENSURE COMPLETE) LIQD Take 237 mLs by mouth 2 (two) times daily between meals.      Marland Kitchen levalbuterol (XOPENEX HFA) 45 MCG/ACT inhaler Inhale 2 puffs into the lungs every 6 (six) hours as needed for wheezing.  1 Inhaler  12  . lisinopril (PRINIVIL,ZESTRIL) 2.5 MG tablet Take 1 tablet (2.5 mg total) by mouth daily.  30 tablet  6  . nitroGLYCERIN (NITROSTAT) 0.4 MG SL tablet Place 1 tablet (0.4 mg total) under the tongue every 5 (five) minutes as needed for chest pain.  25 tablet  4  . rosuvastatin (CRESTOR) 20 MG tablet Take 2 tablets (40 mg total) by mouth daily.  14 tablet  0  . Ticagrelor (BRILINTA) 90 MG TABS tablet Take 1 tablet (90 mg total) by mouth 2 (two) times daily.  60 tablet  6  . metoprolol (LOPRESSOR) 100 MG tablet Take 1 tablet (100 mg total) by mouth 2 (two) times daily.  180 tablet  3   No current  facility-administered medications for this visit.    History   Social History  . Marital Status: Divorced    Spouse Name: N/A    Number of Children: N/A  . Years of Education: N/A   Occupational History  . Not on file.   Social History Main Topics  . Smoking status: Former Smoker -- 1.00 packs/day for 38 years    Types: Cigarettes    Quit date: 07/10/2008  . Smokeless tobacco: Never Used  . Alcohol Use: No  . Drug Use: No  . Sexual Activity: Yes   Other Topics Concern  . Not on file   Social History Narrative  . No narrative on file    Family  History  Problem Relation Age of Onset  . Family history unknown: Yes    ROS is negative for fevers, chills or night sweats. He denies visual changes. Ears no difficulty with urine presumably lymphadenopathy. He denies any weight loss. He denies cough. His prior history of significant tobacco use. He denies wheezing. Denies chest pressure. There is no pre-syncope or syncope. There is no nausea vomiting or diarrhea. He denies bleeding. There is no blood in stool or urine. He denies claudication. He is unaware of leg swelling. He denies issues with sleep. There is no known diabetes. He denies cold or heat intolerance.  Other comprehensive 14 point system review is negative.  PE BP 118/78  Pulse 74  Ht 5\' 6"  (1.676 m)  Wt 126 lb 14.4 oz (57.561 kg)  BMI 20.49 kg/m2  General: Alert, oriented, no distress.  Skin: normal turgor, no rashes HEENT: Normocephalic, atraumatic. Pupils round and reactive; sclera anicteric;no lid lag. Extraocular muscles intact;; no xanthelasmas. Nose without nasal septal hypertrophy Mouth/Parynx benign; Mallinpatti scale 3 Neck: No JVD, no carotid bruits; normal carotid upstroke Lungs: clear to ausculatation and percussion; no wheezing or rales Chest wall: no tenderness to palpitation Heart: RRR, s1 s2 normal; faint 1/6 systolic murmur;no diastolic murmur, rub thrills or heaves Abdomen: soft, nontender; no hepatosplenomehaly, BS+; abdominal aorta nontender and not dilated by palpation. Back: no CVA tenderness Pulses 2+ Extremities: no clubbing cyanosis or edema, Homan's sign negative  Neurologic: grossly nonfocal; cranial nerves grossly normal. Psychologic: normal affect and mood.  ECG (independently read by me): Normal sinus rhythm 74 beats per minute. Normal intervals. No ECG evidence of his recent myocardial infarction  LABS:  BMET    Component Value Date/Time   NA 139 06/19/2013 1040   K 4.7 06/19/2013 1040   CL 101 06/19/2013 1040   CO2 22 06/19/2013 1040     GLUCOSE 102* 06/19/2013 1040   BUN 22 06/19/2013 1040   CREATININE 0.68 06/19/2013 1040   CALCIUM 9.4 06/19/2013 1040   GFRNONAA >90 06/19/2013 1040   GFRAA >90 06/19/2013 1040     Hepatic Function Panel     Component Value Date/Time   PROT 6.6 06/12/2013 0638   ALBUMIN 3.2* 06/12/2013 0638   AST 53* 06/12/2013 0638   ALT 34 06/12/2013 0638   ALKPHOS 90 06/12/2013 0638   BILITOT 0.4 06/12/2013 0638     CBC    Component Value Date/Time   WBC 15.4* 06/19/2013 1040   RBC 5.06 06/19/2013 1040   HGB 15.9 06/19/2013 1040   HCT 45.6 06/19/2013 1040   PLT 397 06/19/2013 1040   MCV 90.1 06/19/2013 1040   MCH 31.4 06/19/2013 1040   MCHC 34.9 06/19/2013 1040   RDW 13.6 06/19/2013 1040   LYMPHSABS 1.3 06/12/2013 7846  MONOABS 2.7* 06/12/2013 0638   EOSABS 0.0 06/12/2013 0638   BASOSABS 0.0 06/12/2013 0638     BNP No results found for this basename: probnp    Lipid Panel     Component Value Date/Time   CHOL 132 06/12/2013 0432   TRIG 94 06/12/2013 0432   HDL 36* 06/12/2013 0432   CHOLHDL 3.7 06/12/2013 0432   VLDL 19 06/12/2013 0432   LDLCALC 77 06/12/2013 0432     RADIOLOGY: No results found.    ASSESSMENT AND PLAN: Timothy [name] is now 1 month status post his out of hospital cardiac arrest which time he was found to be in VF and required successful defibrillation and CPR institution prior to arrival to the hospital. I took him acutely to the catheterization laboratory and he was found to have total occlusion of the mid right artery which was successfully intervened upon with restoration of TIMI-3 flow. Initial ejection fraction was 50-55% with mild inferior hypocontractility. He has been without recurrent chest pain symptoms. His resting pulse is 74. I will further titrate his metoprolol tartrate to 100 mg twice a day. Laboratory will be checked in the fasting state consisting of a comprehensive metabolic panel, CBC, lipid profile, and thyroid studies. I have scheduled him for an echo Doppler study in  approximately one month. We'll also schedule him for a LexiScan Myoview study prior to him returning to his very physical job. I will see him in 4 weeks for followup evaluation.     Troy Sine, MD, [facility]  07/17/2013 8:49 AM

## 2013-07-21 ENCOUNTER — Telehealth (HOSPITAL_COMMUNITY): Payer: Self-pay

## 2013-07-21 LAB — LIPID PANEL
CHOL/HDL RATIO: 3.6 ratio
CHOLESTEROL: 135 mg/dL (ref 0–200)
HDL: 38 mg/dL — AB (ref >39)
LDL Cholesterol: 58 mg/dL (ref 0–99)
TRIGLYCERIDES: 197 mg/dL — AB (ref <150)
VLDL: 39 mg/dL (ref 0–40)

## 2013-07-21 LAB — COMPREHENSIVE METABOLIC PANEL
ALBUMIN: 4.5 g/dL (ref 3.5–5.2)
ALT: 23 U/L (ref 0–53)
AST: 16 U/L (ref 0–37)
Alkaline Phosphatase: 78 U/L (ref 39–117)
BUN: 17 mg/dL (ref 6–23)
CALCIUM: 10.2 mg/dL (ref 8.4–10.5)
CO2: 26 meq/L (ref 19–32)
Chloride: 101 mEq/L (ref 96–112)
Creat: 0.81 mg/dL (ref 0.50–1.35)
GLUCOSE: 95 mg/dL (ref 70–99)
POTASSIUM: 4.8 meq/L (ref 3.5–5.3)
SODIUM: 139 meq/L (ref 135–145)
TOTAL PROTEIN: 7.7 g/dL (ref 6.0–8.3)
Total Bilirubin: 0.4 mg/dL (ref 0.2–1.2)

## 2013-07-21 LAB — CBC
HCT: 45.4 % (ref 39.0–52.0)
Hemoglobin: 15.6 g/dL (ref 13.0–17.0)
MCH: 30.5 pg (ref 26.0–34.0)
MCHC: 34.4 g/dL (ref 30.0–36.0)
MCV: 88.8 fL (ref 78.0–100.0)
PLATELETS: 438 10*3/uL — AB (ref 150–400)
RBC: 5.11 MIL/uL (ref 4.22–5.81)
RDW: 14.9 % (ref 11.5–15.5)
WBC: 14.5 10*3/uL — AB (ref 4.0–10.5)

## 2013-07-21 LAB — TSH: TSH: 1.353 u[IU]/mL (ref 0.350–4.500)

## 2013-07-25 ENCOUNTER — Inpatient Hospital Stay (HOSPITAL_COMMUNITY): Admission: RE | Admit: 2013-07-25 | Payer: Self-pay | Source: Ambulatory Visit

## 2013-07-25 ENCOUNTER — Ambulatory Visit (HOSPITAL_COMMUNITY)
Admission: RE | Admit: 2013-07-25 | Discharge: 2013-07-25 | Disposition: A | Discharge status: Home / Self Care | Payer: Self-pay | Source: Ambulatory Visit | Attending: Cardiovascular Disease | Admitting: Cardiovascular Disease

## 2013-07-25 DIAGNOSIS — I2510 Atherosclerotic heart disease of native coronary artery without angina pectoris: Secondary | ICD-10-CM

## 2013-08-02 ENCOUNTER — Encounter: Payer: Self-pay | Admitting: Cardiovascular Disease

## 2013-08-02 ENCOUNTER — Ambulatory Visit (INDEPENDENT_AMBULATORY_CARE_PROVIDER_SITE_OTHER): Payer: Self-pay | Admitting: Cardiovascular Disease

## 2013-08-02 VITALS — BP 148/96 | HR 72 | Ht 66.0 in | Wt 126.4 lb

## 2013-08-02 DIAGNOSIS — E785 Hyperlipidemia, unspecified: Secondary | ICD-10-CM

## 2013-08-02 DIAGNOSIS — I469 Cardiac arrest, cause unspecified: Secondary | ICD-10-CM

## 2013-08-02 DIAGNOSIS — I2510 Atherosclerotic heart disease of native coronary artery without angina pectoris: Secondary | ICD-10-CM

## 2013-08-02 DIAGNOSIS — I10 Essential (primary) hypertension: Secondary | ICD-10-CM

## 2013-08-02 DIAGNOSIS — J441 Chronic obstructive pulmonary disease with (acute) exacerbation: Secondary | ICD-10-CM

## 2013-08-02 MED ORDER — TICAGRELOR 90 MG PO TABS: 90.0000 mg | ORAL_TABLET | Freq: Two times a day (BID) | ORAL | Status: DC

## 2013-08-02 MED ORDER — ROSUVASTATIN CALCIUM 20 MG PO TABS: 40.0000 mg | ORAL_TABLET | Freq: Every day | ORAL | Status: DC

## 2013-08-02 MED ORDER — BUDESONIDE-FORMOTEROL FUMARATE 160-4.5 MCG/ACT IN AERO: 2.0000 | INHALATION_SPRAY | Freq: Two times a day (BID) | RESPIRATORY_TRACT | Status: DC

## 2013-08-02 MED ORDER — IRBESARTAN 150 MG PO TABS: 150.0000 mg | ORAL_TABLET | Freq: Every day | ORAL | Status: DC

## 2013-08-02 NOTE — Progress Notes (Signed)
Patient ID: Timothy [name], male   DOB: 1955-10-13, 58 y.o.   MRN: [id number]     HPI: [name] is a 58 y.o. male who presents for office visit followup of his out of hospital ventricular fibrillation  cardiac arrest and successful cardiac catheterization and percutaneous coronary intervention and recent initial office followup evaluation.  [name] presented to [facility] hospital in the early morning of 06/12/2013 after being found down at home EMS arrived he was in ventricular fibrillation. He was transported acutely to [facility] hospital  cardiac catheterization laboratory and hypothermia protocol was implemented. Emergent catheterization revealed total occlusion of the RCA and he underwent successful intervention with ultimate insertion of a 2.5x18 mm  post dilated to 2.75 mm with the percent occlusion reduced to 0% and with resumption of brisk TIMI 3 flow. He ultimately awoke and was intact neurologically after the hypothermia protocol completed his course. He did require levophed initially for hypotension. He was discharged on 08/17/2013. He has not yet returned to work.  Since discharge, he denies recurrent chest pain. He denies PND orthopnea. He was sent home on aspirin and brilinta for dual anti-platelet therapy, metoprolol 75 mg twice a day lisinopril 2.5 mg, Crestor 20 mg. 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 At that time, I further titrate his metoprolol tartrate to 100 mg twice a day. I recommended laboratory be checked in the fasting state. 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 He presents today for followup evaluation.  [name] never did undergo his studies. He stated that he was unable to afford the cost since his insurance has not yet become effective. He also tells me he read out of his [place]. He has been wheezing on a daily basis. He previously had smoked for 30 years but  quit smoking 5 years ago. He does have a Xopenex inhaler..  Past Medical History  Diagnosis Date  . Hypokalemia, replaced 06/19/2013  . Hypomagnesemia, replaced 06/19/2013  . Cardiac arrest, hypothermic protocol 06/12/2013  . NSTEMI (non-ST elevated myocardial infarction), DES to RCA 06/13/2013  . COPD exacerbation 06/12/2013  . Hyperlipidemia LDL goal < 70 06/19/2013  . Hypotension, requiring levophed 06/19/2013  . Bronchitis, chronic 06/19/2013  . Tobacco abuse 06/19/2013  . Altered mental status, improved at discharge 06/19/2013    Past Surgical History  Procedure Laterality Date  . No past surgeries    . Cardiac sstent    . Coronary angioplasty with stent placement  06/12/13    Xience Alpine DES to RCA, NSTEMI    Allergies  Allergen Reactions  . Aspirin Swelling    To lips.  . Ibuprofen Swelling    To lips.    Current Outpatient Prescriptions  Medication Sig Dispense Refill  . acetaminophen (TYLENOL) 325 MG tablet Take 2 tablets (650 mg total) by mouth every 4 (four) hours as needed for headache or mild pain.      Marland Kitchen aspirin 81 MG chewable tablet Chew 1 tablet (81 mg total) by mouth daily.      . feeding supplement, ENSURE COMPLETE, (ENSURE COMPLETE) LIQD Take 237 mLs by mouth 2 (two) times daily between meals.      Marland Kitchen levalbuterol (XOPENEX HFA) 45 MCG/ACT inhaler Inhale 2 puffs into the lungs every 6 (six) hours as needed for wheezing.  1 Inhaler  12  . metoprolol (LOPRESSOR) 100 MG tablet  Take 1 tablet (100 mg total) by mouth 2 (two) times daily.  180 tablet  3  . nitroGLYCERIN (NITROSTAT) 0.4 MG SL tablet Place 1 tablet (0.4 mg total) under the tongue every 5 (five) minutes as needed for chest pain.  25 tablet  4  . budesonide-formoterol (SYMBICORT) 160-4.5 MCG/ACT inhaler Inhale 2 puffs into the lungs 2 (two) times daily.  1 Inhaler  12  . irbesartan (AVAPRO) 150 MG tablet Take 1 tablet (150 mg total) by mouth daily.  30 tablet  6  . rosuvastatin (CRESTOR) 20 MG tablet Take 2  tablets (40 mg total) by mouth daily.  30 tablet  6  . Ticagrelor (BRILINTA) 90 MG TABS tablet Take 1 tablet (90 mg total) by mouth 2 (two) times daily.  60 tablet  6   No current facility-administered medications for this visit.    History   Social History  . Marital Status: Divorced    Spouse Name: N/A    Number of Children: N/A  . Years of Education: N/A   Occupational History  . Not on file.   Social History Main Topics  . Smoking status: Former Smoker -- 1.00 packs/day for 38 years    Types: Cigarettes    Quit date: 07/10/2008  . Smokeless tobacco: Never Used  . Alcohol Use: No  . Drug Use: No  . Sexual Activity: Yes   Other Topics Concern  . Not on file   Social History Narrative  . No narrative on file    History reviewed. No pertinent family history.  ROS is negative for fevers, chills or night sweats. He denies visual changes. Ears no difficulty with urine presumably lymphadenopathy. He denies any weight loss. He denies cough. His prior history of significant tobacco use. He denies wheezing. Denies chest pressure. There is no pre-syncope or syncope. There is no nausea vomiting or diarrhea. He denies bleeding. There is no blood in stool or urine. He denies claudication. He is unaware of leg swelling. He denies issues with sleep. There is no known diabetes. He denies cold or heat intolerance.  Other comprehensive 14 point system review is negative.  PE BP 148/96  Pulse 72  Ht 5\' 6"  (1.676 m)  Wt 126 lb 6.4 oz (57.335 kg)  BMI 20.41 kg/m2  General: Alert, oriented, no distress.  Skin: normal turgor, no rashes HEENT: Normocephalic, atraumatic. Pupils round and reactive; sclera anicteric;no lid lag. Extraocular muscles intact;; no xanthelasmas. Nose without nasal septal hypertrophy Mouth/Parynx benign; Mallinpatti scale 3 Neck: No JVD, no carotid bruits; normal carotid upstroke Lungs: Decreased breath sounds with diffuse wheezing Chest wall: no tenderness to  palpitation Heart: RRR, s1 s2 normal; faint 1/6 systolic murmur;no diastolic murmur, rub thrills or heaves Abdomen: soft, nontender; no hepatosplenomehaly, BS+; abdominal aorta nontender and not dilated by palpation. Back: no CVA tenderness Pulses 2+ Extremities: no clubbing cyanosis or edema, Homan's sign negative  Neurologic: grossly nonfocal; cranial nerves grossly normal. Psychologic: normal affect and mood.  ECG (independently read by me) normal sinus rhythm at 72 beats per minute. QS in V1 and V2.  Prior 07/10/2013 ECG (independently read by me): Normal sinus rhythm 74 beats per minute. Normal intervals. No ECG evidence of his recent myocardial infarction  LABS:  BMET    Component Value Date/Time   NA 139 07/21/2013 0907   K 4.8 07/21/2013 0907   CL 101 07/21/2013 0907   CO2 26 07/21/2013 0907   GLUCOSE 95 07/21/2013 0907   BUN 17 07/21/2013  6333   CREATININE 0.81 07/21/2013 0907   CREATININE 0.68 06/19/2013 1040   CALCIUM 10.2 07/21/2013 0907   GFRNONAA >90 06/19/2013 1040   GFRAA >90 06/19/2013 1040     Hepatic Function Panel     Component Value Date/Time   PROT 7.7 07/21/2013 0907   ALBUMIN 4.5 07/21/2013 0907   AST 16 07/21/2013 0907   ALT 23 07/21/2013 0907   ALKPHOS 78 07/21/2013 0907   BILITOT 0.4 07/21/2013 0907     CBC    Component Value Date/Time   WBC 14.5* 07/21/2013 0907   RBC 5.11 07/21/2013 0907   HGB 15.6 07/21/2013 0907   HCT 45.4 07/21/2013 0907   PLT 438* 07/21/2013 0907   MCV 88.8 07/21/2013 0907   MCH 30.5 07/21/2013 0907   MCHC 34.4 07/21/2013 0907   RDW 14.9 07/21/2013 0907   LYMPHSABS 1.3 06/12/2013 0638   MONOABS 2.7* 06/12/2013 0638   EOSABS 0.0 06/12/2013 0638   BASOSABS 0.0 06/12/2013 0638     BNP No results found for this basename: probnp    Lipid Panel     Component Value Date/Time   CHOL 135 07/21/2013 0907   TRIG 197* 07/21/2013 0907   HDL 38* 07/21/2013 0907   CHOLHDL 3.6 07/21/2013 0907   VLDL 39 07/21/2013 0907   LDLCALC 58 07/21/2013 0907       RADIOLOGY: No results found.    ASSESSMENT AND PLAN: [name] is now 2 month status post his out of hospital cardiac arrest which time he was found to be in VF and required successful defibrillation and CPR institution prior to arrival to the hospital. I took him acutely to the catheterization laboratory and he was found to have total occlusion of the mid RCA which was successfully intervened upon with restoration of TIMI-3 flow. Initial ejection fraction was 50-55% with mild inferior hypocontractility. He has been without recurrent chest pain symptoms. His laboratory was reviewed. LDL cholesterol is 58. Currently he had run out of his Brilinta and Crestor and did not get these refilled. He has significantly decreased breath sounds with wheezing today on exam. For this reason, I am recommending reduction of his metoprolol dose to 50 mg twice a day. I will also discontinue his ace inhibition and change him to ARB therapy with irbesartan 150 mg daily. As part of [employer] program we will try to get him  Symbicort 160/4.5, Brilinta90 mg, and Crestor 20 mg. We did provide him with samples of Brilinta and crestor today. I will see him in 4 weeks for cardiology reevaluation.   Troy Sine, MD, [facility]  08/02/2013 6:51 PM

## 2013-08-02 NOTE — Patient Instructions (Signed)
Your physician recommends that you schedule a follow-up appointment in: 4 weeks  Your physician has recommended you make the following change in your medication: Stop Lisinopril and Start Irbesartan 150 mg daily, Start Symbicort 160/4.5 twice a day. Take Brilinta Daily if you are out of Brilinta please call the office to see iuf we have any samples available, it is not good to be off brilinta

## 2013-08-03 DIAGNOSIS — I10 Essential (primary) hypertension: Secondary | ICD-10-CM | POA: Insufficient documentation

## 2013-08-09 ENCOUNTER — Telehealth (HOSPITAL_COMMUNITY): Payer: Self-pay | Admitting: *Deleted

## 2013-08-30 ENCOUNTER — Ambulatory Visit (INDEPENDENT_AMBULATORY_CARE_PROVIDER_SITE_OTHER): Payer: Self-pay | Admitting: Cardiology

## 2013-08-30 ENCOUNTER — Encounter: Payer: Self-pay | Admitting: Cardiology

## 2013-08-30 ENCOUNTER — Emergency Department (HOSPITAL_COMMUNITY): Payer: Medicaid Other

## 2013-08-30 ENCOUNTER — Encounter (HOSPITAL_COMMUNITY): Payer: Self-pay | Admitting: Emergency Medicine

## 2013-08-30 ENCOUNTER — Emergency Department (HOSPITAL_COMMUNITY)
Admission: EM | Admit: 2013-08-30 | Discharge: 2013-08-30 | Disposition: A | Discharge status: Home / Self Care | Payer: Medicaid Other | Attending: Emergency Medicine | Admitting: Emergency Medicine

## 2013-08-30 VITALS — BP 128/92 | HR 79 | Ht 66.0 in | Wt 123.4 lb

## 2013-08-30 DIAGNOSIS — R0602 Shortness of breath: Secondary | ICD-10-CM | POA: Diagnosis present

## 2013-08-30 DIAGNOSIS — R0609 Other forms of dyspnea: Secondary | ICD-10-CM | POA: Insufficient documentation

## 2013-08-30 DIAGNOSIS — Z79899 Other long term (current) drug therapy: Secondary | ICD-10-CM | POA: Insufficient documentation

## 2013-08-30 DIAGNOSIS — I2510 Atherosclerotic heart disease of native coronary artery without angina pectoris: Secondary | ICD-10-CM

## 2013-08-30 DIAGNOSIS — Z888 Allergy status to other drugs, medicaments and biological substances status: Secondary | ICD-10-CM | POA: Diagnosis not present

## 2013-08-30 DIAGNOSIS — Z8709 Personal history of other diseases of the respiratory system: Secondary | ICD-10-CM | POA: Diagnosis not present

## 2013-08-30 DIAGNOSIS — J45901 Unspecified asthma with (acute) exacerbation: Principal | ICD-10-CM

## 2013-08-30 DIAGNOSIS — Z87891 Personal history of nicotine dependence: Secondary | ICD-10-CM | POA: Diagnosis not present

## 2013-08-30 DIAGNOSIS — E785 Hyperlipidemia, unspecified: Secondary | ICD-10-CM | POA: Insufficient documentation

## 2013-08-30 DIAGNOSIS — Z8679 Personal history of other diseases of the circulatory system: Secondary | ICD-10-CM | POA: Insufficient documentation

## 2013-08-30 DIAGNOSIS — J441 Chronic obstructive pulmonary disease with (acute) exacerbation: Secondary | ICD-10-CM | POA: Diagnosis not present

## 2013-08-30 DIAGNOSIS — R0600 Dyspnea, unspecified: Secondary | ICD-10-CM | POA: Insufficient documentation

## 2013-08-30 LAB — CBC
HCT: 49 % (ref 39.0–52.0)
Hemoglobin: 17.2 g/dL — ABNORMAL HIGH (ref 13.0–17.0)
MCH: 32.1 pg (ref 26.0–34.0)
MCHC: 35.1 g/dL (ref 30.0–36.0)
MCV: 91.4 fL (ref 78.0–100.0)
PLATELETS: 322 10*3/uL (ref 150–400)
RBC: 5.36 MIL/uL (ref 4.22–5.81)
RDW: 13.5 % (ref 11.5–15.5)
WBC: 13.6 10*3/uL — ABNORMAL HIGH (ref 4.0–10.5)

## 2013-08-30 LAB — BASIC METABOLIC PANEL
BUN: 13 mg/dL (ref 6–23)
CALCIUM: 10.3 mg/dL (ref 8.4–10.5)
CO2: 23 meq/L (ref 19–32)
CREATININE: 0.74 mg/dL (ref 0.50–1.35)
Chloride: 98 mEq/L (ref 96–112)
GFR calc Af Amer: >90 mL/min (ref >90)
GFR calc non Af Amer: >90 mL/min (ref >90)
GLUCOSE: 105 mg/dL — AB (ref 70–99)
Potassium: 4.1 mEq/L (ref 3.7–5.3)
SODIUM: 138 meq/L (ref 137–147)

## 2013-08-30 LAB — I-STAT TROPONIN, ED: Troponin i, poc: 0 ng/mL (ref 0.00–0.08)

## 2013-08-30 MED ORDER — ONDANSETRON 4 MG PO TBDP
4.0000 mg | ORAL_TABLET | Freq: Once | ORAL | Status: AC
  Administered 2013-08-30: 4 mg via ORAL
  Filled 2013-08-30: qty 1

## 2013-08-30 MED ORDER — PREDNISONE 10 MG PO TABS: 40.0000 mg | ORAL_TABLET | Freq: Every day | ORAL | Status: DC

## 2013-08-30 MED ORDER — PREDNISONE 20 MG PO TABS
40.0000 mg | ORAL_TABLET | Freq: Every day | ORAL | Status: DC
  Administered 2013-08-30: 40 mg via ORAL
  Filled 2013-08-30: qty 2

## 2013-08-30 MED ORDER — IPRATROPIUM-ALBUTEROL 0.5-2.5 (3) MG/3ML IN SOLN
3.0000 mL | Freq: Once | RESPIRATORY_TRACT | Status: AC
  Administered 2013-08-30: 3 mL via RESPIRATORY_TRACT
  Filled 2013-08-30: qty 3

## 2013-08-30 MED ORDER — IPRATROPIUM-ALBUTEROL 0.5-2.5 (3) MG/3ML IN SOLN
3.0000 mL | RESPIRATORY_TRACT | Status: DC
  Administered 2013-08-30: 3 mL via RESPIRATORY_TRACT
  Filled 2013-08-30: qty 3

## 2013-08-30 MED ORDER — ALBUTEROL SULFATE HFA 108 (90 BASE) MCG/ACT IN AERS
6.0000 | INHALATION_SPRAY | Freq: Once | RESPIRATORY_TRACT | Status: AC
  Administered 2013-08-30: 6 via RESPIRATORY_TRACT
  Filled 2013-08-30: qty 6.7

## 2013-08-30 NOTE — ED Notes (Signed)
Pt also reports he had a heart attack in February.

## 2013-08-30 NOTE — Assessment & Plan Note (Signed)
He denies CP, pressure/ tightness. He ran out of his Brilinta 2 days ago. I stressed to him the importance of strict daily compliance for stent patency. Samples of Brilinta and ASA were provided. He was instructed to contact our office if he runs out/ or cannot afford Brilinta in the future. He is scheduled to f/u with Dr. Claiborne Billings next week. As cost will be an issue, we will need to consider switching him to Plavix. Will defer to Dr. Claiborne Billings.

## 2013-08-30 NOTE — ED Provider Notes (Signed)
CSN: [id number]     Arrival date & time 08/30/13  1853 History   First MD Initiated Contact with Patient 08/30/13 1910     Chief Complaint  Patient presents with  . Shortness of Breath     (Consider location/radiation/quality/duration/timing/severity/associated sxs/prior Treatment) HPI  This is a 58 y.o. male with PMH of NSTEMI (2 months ago sp stent placement), COPD, HLD, presenting from cardiology office with dyspnea, wheezing. Onset days ago, at home. Has been worsening. Waxes and wanes, worse at night. Positive for nonproductive cough. Negative for chest pain. Negative for leg swelling. Negative for diaphoresis.  Past Medical History  Diagnosis Date  . Hypokalemia, replaced 06/19/2013  . Hypomagnesemia, replaced 06/19/2013  . Cardiac arrest, hypothermic protocol 06/12/2013  . NSTEMI (non-ST elevated myocardial infarction), DES to RCA 06/13/2013  . COPD exacerbation 06/12/2013  . Hyperlipidemia LDL goal < 70 06/19/2013  . Hypotension, requiring levophed 06/19/2013  . Bronchitis, chronic 06/19/2013  . Tobacco abuse 06/19/2013  . Altered mental status, improved at discharge 06/19/2013   Past Surgical History  Procedure Laterality Date  . No past surgeries    . Cardiac sstent    . Coronary angioplasty with stent placement  06/12/13    Xience [employer] DES to RCA, NSTEMI   No family history on file. History  Substance Use Topics  . Smoking status: Former Smoker -- 1.00 packs/day for 38 years    Types: Cigarettes    Quit date: 07/10/2008  . Smokeless tobacco: Never Used  . Alcohol Use: No    Review of Systems  Constitutional: Negative for fever and chills.  HENT: Negative for facial swelling.   Eyes: Negative for pain and visual disturbance.  Respiratory: Positive for shortness of breath and wheezing. Negative for chest tightness.   Cardiovascular: Negative for chest pain.  Gastrointestinal: Negative for nausea and vomiting.  Genitourinary: Negative for dysuria.  Musculoskeletal:  Negative for arthralgias and myalgias.  Neurological: Negative for headaches.  Psychiatric/Behavioral: Negative for behavioral problems.      Allergies  Aspirin and Ibuprofen  Home Medications   Prior to Admission medications   Medication Sig Start Date End Date Taking? Authorizing Provider  metoprolol (LOPRESSOR) 100 MG tablet Take 1 tablet (100 mg total) by mouth 2 (two) times daily. 07/10/13  Yes Troy Sine, MD  nitroGLYCERIN (NITROSTAT) 0.4 MG SL tablet Place 1 tablet (0.4 mg total) under the tongue every 5 (five) minutes as needed for chest pain. 06/19/13  Yes Cecilie Kicks, NP  rosuvastatin (CRESTOR) 20 MG tablet Take 2 tablets (40 mg total) by mouth daily. 08/02/13  Yes Troy Sine, MD  Ticagrelor (BRILINTA) 90 MG TABS tablet Take 1 tablet (90 mg total) by mouth 2 (two) times daily. 08/02/13  Yes Troy Sine, MD  acetaminophen (TYLENOL) 325 MG tablet Take 2 tablets (650 mg total) by mouth every 4 (four) hours as needed for headache or mild pain. 06/19/13   Cecilie Kicks, NP   BP 113/80  Pulse 82  Temp(Src) 97.5 F (36.4 C) (Oral)  Resp 18  Wt 123 lb (55.792 kg)  SpO2 98% Physical Exam  Constitutional: He is oriented to person, place, and time. He appears well-developed and well-nourished. No distress.  HENT:  Head: Normocephalic and atraumatic.  Mouth/Throat: No oropharyngeal exudate.  Eyes: Conjunctivae are normal. Pupils are equal, round, and reactive to light. No scleral icterus.  Neck: Normal range of motion. No tracheal deviation present. No thyromegaly present.  Cardiovascular: Normal rate, regular rhythm and normal  heart sounds.  Exam reveals no gallop and no friction rub.   No murmur heard. Pulmonary/Chest: Effort normal. No stridor. No respiratory distress. He has wheezes (diffuse). He has no rales. He exhibits no tenderness.  Abdominal: Soft. He exhibits no distension and no mass. There is no tenderness. There is no rebound and no guarding.  Musculoskeletal:  Normal range of motion. He exhibits no edema.  Neurological: He is alert and oriented to person, place, and time.  Skin: Skin is warm and dry. He is not diaphoretic.    ED Course  Procedures (including critical care time) Labs Review Labs Reviewed  CBC - Abnormal; Notable for the following:    WBC 13.6 (*)    Hemoglobin 17.2 (*)    All other components within normal limits  BASIC METABOLIC PANEL  I-STAT TROPOININ, ED    Imaging Review Dg Chest 2 View (if Patient Has Fever And/or Copd)  08/30/2013   CLINICAL DATA:  Chest pain, shortness of breath and cough since MI in February 2015, history smoking, COPD  EXAM: CHEST  2 VIEW  COMPARISON:  None  FINDINGS: Normal heart size, mediastinal contours, and pulmonary vascularity.  Emphysematous and bronchitic changes consistent with COPD.  RIGHT apical bullous disease.  No acute infiltrate, pleural effusion or pneumothorax.  Scattered artifacts from EKG leads.  No acute osseous findings.  IMPRESSION: COPD changes.  No acute abnormalities.   Electronically Signed   By: Lavonia Dana M.D.   On: 08/30/2013 19:36    MDM   Final diagnoses:  None    This is a 58 y.o. male with PMH of NSTEMI (2 months ago sp stent placement), COPD, HLD, presenting from cardiology office with dyspnea, wheezing. Onset days ago, at home. Has been worsening. Waxes and wanes, worse at night. Positive for nonproductive cough. Negative for chest pain. Negative for leg swelling. Negative for diaphoresis.  Examination as above. Vital signs are within normal limits. Patient has no retractions, no increase in work of breathing.  He is not tachypneic. He is saturating well on room air. He has diffuse wheezes. Negative for rales on my exam.  Cardiology clinic note reflects that this is highly unlikely to be due to cardiac etiology. I agree with this, considering the lack of chest pain, stent placed 2 months ago. They also state that this is likely not due to CHF. I also agree with  this, as his ejection fraction was adequate 2 months ago on catheterization.  His EKG is completely within normal limits. History troponin is undetectable.  I do not feel as though his presentation represents ACS, PE, PTX, PNA, pericarditis, tamponade, dissection, esophageal pathology.     Date: 08/30/2013  Rate: 82  Rhythm: normal sinus rhythm  QRS Axis: normal  Intervals: normal  ST/T Wave abnormalities: normal  Conduction Disutrbances:none  Narrative Interpretation: NSR  Old EKG Reviewed: unchanged  Administered oral prednisone 40 mg, DuoNeb. He has improved greatly, but is still wheezing diffusely. Will administer additional DuoNeb, reevaluate after.  After second DuoNeb, nursing has ambulated the patient, with persistent O2 saturation of 93% during ambulation, no respiratory distress.  I discussed at great length with the patient and the patient's wife the importance of beginning daily steroid inhalers for her COPD. They express understanding. There are already being set up with a pulmonologist.  They have already contacted social work for financial assistance.  Pt stable for discharge with prescription for steroid burst, albuterol inhaler in hand, FU.  All questions answered.  Return precautions given.  I have discussed case and care has been guided by my attending physician, Dr. Jeneen Rinks.  Doy Hutching, MD 08/31/13 0130

## 2013-08-30 NOTE — ED Notes (Signed)
Pt was recently diagnosed with COPD was given prescription for inhalers but was unable to afford the medication.

## 2013-08-30 NOTE — Discharge Instructions (Signed)
Chronic Obstructive Pulmonary Disease  Chronic obstructive pulmonary disease (COPD) is a common lung condition in which airflow from the lungs is limited. COPD is a general term that can be used to describe many different lung problems that limit airflow, including both chronic bronchitis and emphysema.  If you have COPD, your lung function will probably never return to normal, but there are measures you can take to improve lung function and make yourself feel better.   CAUSES   · Smoking (common).    · Exposure to secondhand smoke.    · Genetic problems.  · Chronic inflammatory lung diseases or recurrent infections.  SYMPTOMS   · Shortness of breath, especially with physical activity.    · Deep, persistent (chronic) cough with a large amount of thick mucus.    · Wheezing.    · Rapid breaths (tachypnea).    · Gray or bluish discoloration (cyanosis) of the skin, especially in fingers, toes, or lips.    · Fatigue.    · Weight loss.    · Frequent infections or episodes when breathing symptoms become much worse (exacerbations).    · Chest tightness.  DIAGNOSIS   Your healthcare provider will take a medical history and perform a physical examination to make the initial diagnosis.  Additional tests for COPD may include:   · Lung (pulmonary) function tests.  · Chest X-ray.  · CT scan.  · Blood tests.  TREATMENT   Treatment available to help you feel better when you have COPD include:   · Inhaler and nebulizer medicines. These help manage the symptoms of COPD and make your breathing more comfortable  · Supplemental oxygen. Supplemental oxygen is only helpful if you have a low oxygen level in your blood.    · Exercise and physical activity. These are beneficial for nearly all people with COPD. Some people may also benefit from a pulmonary rehabilitation program.  HOME CARE INSTRUCTIONS   · Take all medicines (inhaled or pills) as directed by your health care provider.  · Only take over-the-counter or prescription medicines  for pain, fever, or discomfort as directed by your health care provider.    · Avoid over-the-counter medicines or cough syrups that dry up your airway (such as antihistamines) and slow down the elimination of secretions unless instructed otherwise by your healthcare provider.    · If you are a smoker, the most important thing that you can do is stop smoking. Continuing to smoke will cause further lung damage and breathing trouble. Ask your health care provider for help with quitting smoking. He or she can direct you to community resources or hospitals that provide support.  · Avoid exposure to irritants such as smoke, chemicals, and fumes that aggravate your breathing.  · Use oxygen therapy and pulmonary rehabilitation if directed by your health care provider. If you require home oxygen therapy, ask your healthcare provider whether you should purchase a pulse oximeter to measure your oxygen level at home.    · Avoid contact with individuals who have a contagious illness.  · Avoid extreme temperature and humidity changes.  · Eat healthy foods. Eating smaller, more frequent meals and resting before meals may help you maintain your strength.  · Stay active, but balance activity with periods of rest. Exercise and physical activity will help you maintain your ability to do things you want to do.  · Preventing infection and hospitalization is very important when you have COPD. Make sure to receive all the vaccines your health care provider recommends, especially the pneumococcal and influenza vaccines. Ask your healthcare provider whether you   need a pneumonia vaccine.  · Learn and use relaxation techniques to manage stress.  · Learn and use controlled breathing techniques as directed by your health care provider. Controlled breathing techniques include:    · Pursed lip breathing. Start by breathing in (inhaling) through your nose for 1 second. Then, purse your lips as if you were going to whistle and breathe out (exhale)  through the pursed lips for 2 seconds.    · Diaphragmatic breathing. Start by putting one hand on your abdomen just above your waist. Inhale slowly through your nose. The hand on your abdomen should move out. Then purse your lips and exhale slowly. You should be able to feel the hand on your abdomen moving in as you exhale.    · Learn and use controlled coughing to clear mucus from your lungs. Controlled coughing is a series of short, progressive coughs. The steps of controlled coughing are:    1. Lean your head slightly forward.    2. Breathe in deeply using diaphragmatic breathing.    3. Try to hold your breath for 3 seconds.    4. Keep your mouth slightly open while coughing twice.    5. Spit any mucus out into a tissue.    6. Rest and repeat the steps once or twice as needed.  SEEK MEDICAL CARE IF:   · You are coughing up more mucus than usual.    · There is a change in the color or thickness of your mucus.    · Your breathing is more labored than usual.    · Your breathing is faster than usual.    SEEK IMMEDIATE MEDICAL CARE IF:   · You have shortness of breath while you are resting.    · You have shortness of breath that prevents you from:  · Being able to talk.    · Performing your usual physical activities.    · You have chest pain lasting longer than 5 minutes.    · Your skin color is more cyanotic than usual.  · You measure low oxygen saturations for longer than 5 minutes with a pulse oximeter.  MAKE SURE YOU:   · Understand these instructions.  · Will watch your condition.  · Will get help right away if you are not doing well or get worse.  Document Released: 01/28/2005 Document Revised: 02/08/2013 Document Reviewed: 12/15/2012  [employer]® Patient Information ©2014 [employer].

## 2013-08-30 NOTE — ED Notes (Signed)
Pt reporting low oxygen sats today at home. Currently is 98% RA. Has been SOB, trouble talking because of SOB. Had cardiac stents replaced in Feb. Pt is a x 4. Reports CP when he coughs. Wife reports he has been breaking out into cold sweats. Reports just feels weak.

## 2013-08-30 NOTE — ED Notes (Signed)
Pt c/o sob that started about a week ago. Pt also states he has a productive cough with white sputum.

## 2013-08-30 NOTE — ED Notes (Signed)
Discharge and follow up instructions reviewed. Pt verbalized understanding.  

## 2013-08-30 NOTE — ED Notes (Signed)
Tech ambulated pt, pt O2 sats stayed between 90 and 93. Pt states he does not feel sob when he walks.

## 2013-08-30 NOTE — Progress Notes (Signed)
Patient ID: Timothy [name], male   DOB: 1956-03-31, 58 y.o.   MRN: [id number]    08/30/2013 Timothy [name]   1956-01-16  [id number]  Primary Physicia No PCP Per Patient Primary Cardiologist: Dr. Claiborne Billings  HPI:  Timothy [name] is a 58 y.o. male who presents for office visit followup of his out of hospital ventricular fibrillation cardiac arrest and successful cardiac catheterization and percutaneous coronary intervention.   [name] presented to [facility] hospital in the early morning of 06/12/2013 after being found down at home EMS arrived he was in ventricular fibrillation. He was transported acutely to [facility] hospital cardiac catheterization laboratory and hypothermia protocol was implemented. Emergent catheterization revealed total occlusion of the RCA and he underwent successful intervention with ultimate insertion of a 2.5x18 mm post dilated to 2.75 mm with the percent occlusion reduced to 0% and with resumption of brisk TIMI 3 flow. He ultimately awoke and was intact neurologically after the hypothermia protocol completed its course. He did require levophed initially for hypotension. He was discharged on 06/19/2013.   He has been followed by Dr. Claiborne Billings and was seen by him recently for post hospital follow-up. He presents to clinic today for evaluation of SOB. This has been going on for 3 weeks and have progressively worsened. He notes frequent wheezing and a productive cough with white colored sputum. He states that he has been told that he has COPD but has not yet established care with a pulmonologist. He has been noncompliant with his medications, mainly due to cost. He tell me that he ran out of his Brilinta 2 days ago. He failed to notify our office. He has also not been taking Crestor, due to cost. He denies any chest pain, pressure or tightness. He denies LEE or weight gain.    Current Outpatient Prescriptions  Medication Sig Dispense Refill  . acetaminophen (TYLENOL) 325 MG tablet Take 2 tablets  (650 mg total) by mouth every 4 (four) hours as needed for headache or mild pain.      Marland Kitchen aspirin 81 MG chewable tablet Chew 1 tablet (81 mg total) by mouth daily.      . metoprolol (LOPRESSOR) 100 MG tablet Take 1 tablet (100 mg total) by mouth 2 (two) times daily.  180 tablet  3  . nitroGLYCERIN (NITROSTAT) 0.4 MG SL tablet Place 1 tablet (0.4 mg total) under the tongue every 5 (five) minutes as needed for chest pain.  25 tablet  4  . rosuvastatin (CRESTOR) 20 MG tablet Take 2 tablets (40 mg total) by mouth daily.  30 tablet  6  . Ticagrelor (BRILINTA) 90 MG TABS tablet Take 1 tablet (90 mg total) by mouth 2 (two) times daily.  60 tablet  6   No current facility-administered medications for this visit.    Allergies  Allergen Reactions  . Aspirin Swelling    To lips.  . Ibuprofen Swelling    To lips.    History   Social History  . Marital Status: Divorced    Spouse Name: N/A    Number of Children: N/A  . Years of Education: N/A   Occupational History  . Not on file.   Social History Main Topics  . Smoking status: Former Smoker -- 1.00 packs/day for 38 years    Types: Cigarettes    Quit date: 07/10/2008  . Smokeless tobacco: Never Used  . Alcohol Use: No  . Drug Use: No  . Sexual Activity: Yes   Other Topics Concern  .  Not on file   Social History Narrative  . No narrative on file     Review of Systems: General: negative for chills, fever, night sweats or weight changes.  Cardiovascular: negative for chest pain, edema, orthopnea, palpitations, paroxysmal nocturnal dyspnea; positive for shortness of breath Dermatological: negative for rash Respiratory:possitive for cough or wheezing Urologic: negative for hematuria Abdominal: negative for nausea, vomiting, diarrhea, bright red blood per rectum, melena, or hematemesis Neurologic: negative for visual changes, syncope, or dizziness All other systems reviewed and are otherwise negative except as noted  above.    Blood pressure 128/92, pulse 79, height 5\' 6"  (1.676 m), weight 123 lb 6.4 oz (55.974 kg), SpO2 85.00%.  General appearance: alert, cooperative and no distress Neck: no carotid bruit and no JVD Lungs: wheezes bilaterally Heart: regular rate and rhythm Extremities: no LEE Pulses: 2+ and symmetric Skin: warm and dry Neurologic: Grossly normal   ASSESSMENT AND PLAN:   CAD (coronary artery disease) He denies CP, pressure/ tightness. He ran out of his Brilinta 2 days ago. I stressed to him the importance of strict daily compliance for stent patency. Samples of Brilinta and ASA were provided, as well as Crestor. He was instructed to contact our office if he runs out/ or cannot afford Brilinta in the future. He is scheduled to f/u with Dr. Claiborne Billings next week. As cost will be an issue, we will need to consider switching him to Plavix. Will defer to Dr. Claiborne Billings.   SOB (shortness of breath) This does not appear to be CHF. EF was 50-55% at time of cath. He has no JVD, no rales and no LEE. His physical exam is notable for diffuse bilateral wheezing. His O2 sats also drop into the 80s when he ambulates. It is in the low 90s while resting. With productive cough, this is likely a COPD exacerbation. I recommended that the patient report to the ER for evaluation. However, he stated he wanted to wait and be seen in clinic by a pulmonologist. A referral was placed and he is scheduled to be seen in clinic by pulmonology in 2 days (09/01/12).   PLAN  Will f/u with pulmonology. Pt encouraged to go to ER sooner if symptoms worsen before pulmonology appointment. Brilinta samples were provided. SL NTG use instructions were reviewed. He will f/u with Dr. Claiborne Billings next week.   Brittainy SimmonsPA-C 08/30/2013 6:10 PM

## 2013-08-30 NOTE — Patient Instructions (Signed)
1. Take Brilinta 90 mg two times a day as ordered  2. Take ASA 81 mg daily  3.Take Crestor 20 mg Two a day  4. We will put in a referral for a pulmonary consult  5. Keep your appt. With Dr. Claiborne Billings on monday

## 2013-08-30 NOTE — Assessment & Plan Note (Signed)
This does not appear to be CHF. EF was 50-55% at time of cath. He has no JVD, no rales and no LEE. His physical exam if notable for diffuse bilateral wheezing. With productive cough, this is likely a COPD exacerbation. I recommended that the patient report to the ER for evaluation. However, he stated he wanted to wait and be seen in clinic by a pulmonologist. A referral was placed and he is scheduled to be seen in clinic by pulmonology in 2 days (09/01/12).

## 2013-09-01 ENCOUNTER — Ambulatory Visit (INDEPENDENT_AMBULATORY_CARE_PROVIDER_SITE_OTHER): Payer: Self-pay | Admitting: Internal Medicine

## 2013-09-01 ENCOUNTER — Encounter: Payer: Self-pay | Admitting: *Deleted

## 2013-09-01 ENCOUNTER — Encounter: Payer: Self-pay | Admitting: Internal Medicine

## 2013-09-01 VITALS — BP 106/70 | HR 66 | Temp 98.0°F | Ht 66.0 in | Wt 125.0 lb

## 2013-09-01 DIAGNOSIS — J441 Chronic obstructive pulmonary disease with (acute) exacerbation: Secondary | ICD-10-CM

## 2013-09-01 DIAGNOSIS — I10 Essential (primary) hypertension: Secondary | ICD-10-CM

## 2013-09-01 MED ORDER — NEBIVOLOL HCL 10 MG PO TABS: 10.0000 mg | ORAL_TABLET | Freq: Every day | ORAL | Status: DC

## 2013-09-01 MED ORDER — BUDESONIDE-FORMOTEROL FUMARATE 160-4.5 MCG/ACT IN AERO: INHALATION_SPRAY | RESPIRATORY_TRACT | Status: DC

## 2013-09-01 NOTE — Progress Notes (Signed)
   Subjective:    Patient ID: Timothy [name], male    DOB: March 26, 1956   MRN: [id number]  HPI  58 yowm quit smoking 2010 not limited by breathing since then but not aerobically active then MI Feb 2015 and breathing much worse since    09/01/2013 [facility] office visit/ Timothy [name]  Chief Complaint  Patient presents with  . Pulmonary Consult    Referred per Dr. Claiborne Billings. Pt c/o SOB since had MI in Feb 2015. He states breathing has been progressively worse since then.  He states that he gets SOB walking minimal distances- "not that far sometimes". Occ has SOB just at rest. He also c/o CP on and off since Feb 2015.    assoc with cough  Daily prod White mucus esp in am, midline ant cp   with coughing only  No question better p saba    No obvious other patterns in day to day or daytime variabilty or assoc r chest tightness, subjective wheeze overt sinus or hb symptoms. No unusual exp hx or h/o childhood pna/ asthma or knowledge of premature birth.  Sleeping ok without nocturnal   exacerbation  of respiratory  c/o's or need for noct saba. Also denies any obvious fluctuation of symptoms with weather or environmental changes or other aggravating or alleviating factors except as outlined above   Current Medications, Allergies, Complete Past Medical History, Past Surgical History, Family History, and Social History were reviewed in [employer] record.           Review of Systems  Constitutional: Negative for fever, chills, activity change, appetite change and unexpected weight change.  HENT: Negative for congestion, dental problem, postnasal drip, rhinorrhea, sneezing, sore throat, trouble swallowing and voice change.   Eyes: Negative for visual disturbance.  Respiratory: Positive for shortness of breath. Negative for cough and choking.   Cardiovascular: Positive for chest pain. Negative for leg swelling.  Gastrointestinal: Negative for nausea, vomiting and abdominal pain.   Genitourinary: Negative for difficulty urinating.  Musculoskeletal: Negative for arthralgias.  Skin: Negative for rash.  Psychiatric/Behavioral: Negative for behavioral problems and confusion.       Objective:   Physical Exam  Wt Readings from Last 3 Encounters:  09/01/13 125 lb (56.7 kg)  08/30/13 123 lb (55.792 kg)  08/30/13 123 lb 6.4 oz (55.974 kg)      HEENT: nl dentition, turbinates, and orophanx. Nl external ear canals without cough reflex   NECK :  without JVD/Nodes/TM/ nl carotid upstrokes bilaterally   LUNGS: no acc muscle use, clear to A and P bilaterally without cough on insp or exp maneuvers   CV:  RRR  no s3 or murmur or increase in P2, no edema   ABD:  soft and nontender with nl excursion in the supine position. No bruits or organomegaly, bowel sounds nl  MS:  warm without deformities, calf tenderness, cyanosis or clubbing  SKIN: warm and dry without lesions    NEURO:  alert, approp, no deficits      08/30/13 cxr COPD changes.  No acute abnormalities.       Assessment & Plan:

## 2013-09-01 NOTE — Patient Instructions (Addendum)
Symbicort 160 Take 2 puffs first thing in am and then another 2 puffs about 12 hours later- automatically = Plan A  Plan B = backup  Work on inhaler technique:  relax and gently blow all the way out then take a nice smooth deep breath back in, triggering the inhaler at same time you start breathing in.  Hold for up to 5 seconds if you can.  Rinse and gargle with water when done      Only use your albuterol (ventolin/blue) as a rescue medication to be used if you can't catch your breath by resting or doing a relaxed purse lip breathing pattern.  - The less you use it, the better it will work when you need it. - Ok to use up to 2 puffs  every 4 hours if you must but call for immediate appointment if use goes up over your usual need - Don't leave home without it !!  (think of it like the spare tire for your car)   Bystolic 10 mg twice daily in place of metaprolol   Please schedule a follow up office visit in 2 weeks, sooner if needed

## 2013-09-02 NOTE — Assessment & Plan Note (Signed)
Strongly prefer in this setting: Bystolic, the most beta -1  selective Beta blocker available in sample form, with bisoprolol the most selective generic choice  on the market.   Try bystolic 10 mg bid  And recheck in 2 weeks

## 2013-09-02 NOTE — Assessment & Plan Note (Signed)
  When respiratory symptoms begin or become refractory well after a patient reports complete smoking cessation,  Especially when this wasn't the case while they were smoking, a red flag is raised based on the work of Dr Kris Mouton which states:  if you quit smoking when your best day FEV1 is still well preserved it is highly unlikely you will progress to severe disease.  That is to say, once the smoking stops,  the symptoms should not suddenly erupt or markedly worsen.  If so, the differential diagnosis should include  obesity/deconditioning,  LPR/Reflux/Aspiration syndromes,  occult CHF, or  especially side effect of medications commonly used in this population, esp acei and high dose Beta blockers, which may be the case here  The proper method of use, as well as anticipated side effects, of a metered-dose inhaler are discussed and demonstrated to the patient. Improved effectiveness after extensive coaching during this visit to a level of approximately  75% so since feels better p saba will symbicort 160 2bid and try change to bystolic x 2 weeks

## 2013-09-04 ENCOUNTER — Ambulatory Visit (INDEPENDENT_AMBULATORY_CARE_PROVIDER_SITE_OTHER): Payer: Self-pay | Admitting: Cardiovascular Disease

## 2013-09-04 ENCOUNTER — Encounter: Payer: Self-pay | Admitting: Cardiovascular Disease

## 2013-09-04 VITALS — BP 112/68 | HR 69 | Ht 66.0 in | Wt 132.1 lb

## 2013-09-04 DIAGNOSIS — E785 Hyperlipidemia, unspecified: Secondary | ICD-10-CM

## 2013-09-04 DIAGNOSIS — I4729 Other ventricular tachycardia: Secondary | ICD-10-CM

## 2013-09-04 DIAGNOSIS — I469 Cardiac arrest, cause unspecified: Secondary | ICD-10-CM

## 2013-09-04 DIAGNOSIS — I472 Ventricular tachycardia, unspecified: Secondary | ICD-10-CM

## 2013-09-04 DIAGNOSIS — J4489 Other specified chronic obstructive pulmonary disease: Secondary | ICD-10-CM

## 2013-09-04 DIAGNOSIS — J449 Chronic obstructive pulmonary disease, unspecified: Secondary | ICD-10-CM

## 2013-09-04 DIAGNOSIS — I2510 Atherosclerotic heart disease of native coronary artery without angina pectoris: Secondary | ICD-10-CM

## 2013-09-04 DIAGNOSIS — I10 Essential (primary) hypertension: Secondary | ICD-10-CM

## 2013-09-04 DIAGNOSIS — R0602 Shortness of breath: Secondary | ICD-10-CM

## 2013-09-04 MED ORDER — LOSARTAN POTASSIUM 25 MG PO TABS: 25.0000 mg | ORAL_TABLET | Freq: Every day | ORAL | Status: DC

## 2013-09-04 NOTE — Patient Instructions (Signed)
START Losartan 25mg  daily.  This prescription has been sent to your pharmacy.  Your physician has requested that you have an echocardiogram. Echocardiography is a painless test that uses sound waves to create images of your heart. It provides your doctor with information about the size and shape of your heart and how well your heart's chambers and valves are working. This procedure takes approximately one hour. There are no restrictions for this procedure.  Your physician recommends that you schedule a follow-up appointment in: Dr. Claiborne Billings in 3 months.

## 2013-09-04 NOTE — ED Provider Notes (Signed)
I saw and evaluated the patient, reviewed the resident's note and I agree with the findings and plan.   EKG Interpretation   Date/Time:  Wednesday August 30 2013 18:59:04 EDT Ventricular Rate:  82 PR Interval:  142 QRS Duration: 82 QT Interval:  378 QTC Calculation: 441 R Axis:   80 Text Interpretation:  Normal sinus rhythm Normal ECG ED PHYSICIAN  INTERPRETATION AVAILABLE IN [facility] Confirmed by TEST, Record  ([id number]) on 09/01/2013 12:30:43 PM      Please see my dictated note above. I examine this patient independently. I reviewed the history. I agree with EKG interpretation by Dr. Jodi Mourning. I agree with his assessment and plan as stated above.  Patient examined independently. Is reviewed with patient.  She examined. Still dyspneic. Plan will be outpatient treatment as above.Tanna Furry, MD 09/04/13 [phone]

## 2013-09-15 ENCOUNTER — Encounter: Payer: Self-pay | Admitting: Cardiovascular Disease

## 2013-09-15 ENCOUNTER — Ambulatory Visit (INDEPENDENT_AMBULATORY_CARE_PROVIDER_SITE_OTHER): Payer: No Typology Code available for payment source | Admitting: Internal Medicine

## 2013-09-15 ENCOUNTER — Encounter: Payer: Self-pay | Admitting: Internal Medicine

## 2013-09-15 VITALS — BP 110/70 | HR 67 | Temp 98.0°F | Ht 66.0 in | Wt 129.6 lb

## 2013-09-15 DIAGNOSIS — J449 Chronic obstructive pulmonary disease, unspecified: Secondary | ICD-10-CM

## 2013-09-15 MED ORDER — BUDESONIDE-FORMOTEROL FUMARATE 160-4.5 MCG/ACT IN AERO: INHALATION_SPRAY | RESPIRATORY_TRACT | Status: DC

## 2013-09-15 MED ORDER — BISOPROLOL FUMARATE 5 MG PO TABS: 5.0000 mg | ORAL_TABLET | Freq: Every day | ORAL | Status: DC

## 2013-09-15 NOTE — Progress Notes (Signed)
Subjective:    Patient ID: Timothy [name], male    DOB: 1955/05/15   MRN: [id number]    Brief patient profile:  32 yowm quit smoking 2010 not limited by breathing since then but not aerobically active then MI Feb 2015 and breathing much worse since   History of Present Illness  09/01/2013 1st [facility] office visit/ Wert  Chief Complaint  Patient presents with  . Pulmonary Consult    Referred per Dr. Claiborne Billings. Pt c/o SOB since had MI in Feb 2015. He states breathing has been progressively worse since then.  He states that he gets SOB walking minimal distances- "not that far sometimes". Occ has SOB just at rest. He also c/o CP on and off since Feb 2015.    assoc with cough  Daily prod White mucus esp in am, midline ant cp   with coughing only  No question better p saba  rec Symbicort 160 Take 2 puffs first thing in am and then another 2 puffs about 12 hours later- automatically = Plan A Plan B = backup albuterol Work on inhaler technique:         Only use your albuterol (ventolin/blue) as a rescue medication to be used if you can't catch your breath by resting or doing a relaxed purse lip breathing pattern.  - The less you use it, the better it will work when you need it. - Ok to use up to 2 puffs  every 4 hours if you must but call for immediate appointment if use goes up over your usual need - Don't leave home without it !!  (think of it like the spare tire for your car)   Bystolic 10 mg twice daily in place of metaprolol   09/15/2013 f/u ov/Wert re: GOLD III COPD  Chief Complaint  Patient presents with  . Follow-up    Pt states that his breathing is much improved since last visit. No new co's. Has not had to use rescue inhaler.    Not limited by breathing from desired activities  But very inactive   No obvious day to day or daytime variabilty or assoc chronic cough or cp or chest tightness, subjective wheeze overt sinus or hb symptoms. No unusual exp hx or h/o childhood pna/  asthma or knowledge of premature birth.  Sleeping ok without nocturnal  or early am exacerbation  of respiratory  c/o's or need for noct saba. Also denies any obvious fluctuation of symptoms with weather or environmental changes or other aggravating or alleviating factors except as outlined above   Current Medications, Allergies, Complete Past Medical History, Past Surgical History, Family History, and Social History were reviewed in [employer] record.  ROS  The following are not active complaints unless bolded sore throat, dysphagia, dental problems, itching, sneezing,  nasal congestion or excess/ purulent secretions, ear ache,   fever, chills, sweats, unintended wt loss, pleuritic or exertional cp, hemoptysis,  orthopnea pnd or leg swelling, presyncope, palpitations, heartburn, abdominal pain, anorexia, nausea, vomiting, diarrhea  or change in bowel or urinary habits, change in stools or urine, dysuria,hematuria,  rash, arthralgias, visual complaints, headache, numbness weakness or ataxia or problems with walking or coordination,  change in mood/affect or memory.                    Objective:   Physical Exam   09/15/13            130 Wt Readings from Last  3 Encounters:  09/01/13 125 lb (56.7 kg)  08/30/13 123 lb (55.792 kg)  08/30/13 123 lb 6.4 oz (55.974 kg)      HEENT mild turbinate edema.  Oropharynx no thrush or excess pnd or cobblestoning.  No JVD or cervical adenopathy. Mild accessory muscle hypertrophy. Trachea midline, nl thryroid. Chest was hyperinflated by percussion with diminished breath sounds and moderate increased exp time without wheeze. Hoover sign positive at mid inspiration. Regular rate and rhythm without murmur gallop or rub or increase P2 or edema.  Abd: no hsm, nl excursion. Ext warm without cyanosis or clubbing.            08/30/13 cxr COPD changes.  No acute abnormalities.       Assessment & Plan:

## 2013-09-15 NOTE — Patient Instructions (Addendum)
Stop bystolic and start bisoprolol 5 mg daily in its place  Continue symbicort 160 Take 2 puffs first thing in am and then another 2 puffs about 12 hours later.   Work on inhaler technique:  relax and gently blow all the way out then take a nice smooth deep breath back in, triggering the inhaler at same time you start breathing in.  Hold for up to 5 seconds if you can.  Rinse and gargle with water when done   If your mouth or throat starts to bother you,   I suggest you time the inhaler to your dental care and after using the inhaler(s) brush teeth and tongue with a baking soda containing toothpaste and when you rinse this out, gargle with it first to see if this helps your mouth and throat.      If you are satisfied with your treatment plan let your doctor know and he/she can either refill your medications or you can return here when your prescription runs out.     If in any way you are not 100% satisfied,  please tell us.  If 100% better, tell your friends!

## 2013-09-15 NOTE — Progress Notes (Signed)
Patient ID: Timothy [name], male   DOB: January 19, 1956, 58 y.o.   MRN: [id number]     HPI: Timothy [name] is a 58 y.o. male presents for follow-up cardiology evaluation.  He is status post out of hospital hospital ventricular fibrillation  cardiac arrest and successful cardiac catheterization and percutaneous coronary intervention.  Timothy [name] presented to [facility] hospital in the early morning of 06/12/2013 after being found down at home EMS arrived he was in ventricular fibrillation. He was transported acutely to [facility] hospital  cardiac catheterization laboratory and hypothermia protocol was implemented. Emergent catheterization revealed total occlusion of the RCA and he underwent successful intervention with ultimate insertion of a 2.5x18 mm  post dilated to 2.75 mm with the percent occlusion reduced to 0% and with resumption of brisk TIMI 3 flow. He ultimately awoke and was intact neurologically after the hypothermia protocol completed his course. He did require levophed initially for hypotension. He was discharged on 08/17/2013. He has not yet returned to work.  Since discharge, he denies recurrent chest pain. He denies PND orthopnea. He was sent home on aspirin and brilinta for dual anti-platelet therapy, metoprolol 75 mg twice a day lisinopril 2.5 mg, Crestor 20 mg. 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 At that time, I further titrate his metoprolol tartrate to 100 mg twice a day. I recommended laboratory be checked in the fasting state. 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 Timothy [name] never did undergo his studies. He stated that he was unable to afford the cost since his insurance has not yet become effective.  When I last saw him, did run out of his early the and I re-instituted therapy. He also was on an ACE inhibitor and had significant wheezing and I recommended discontinuance of the ACE inhibitor  and start her tension receptor blocker therapy.  As part of the present at the indigent program.  I also try to obtain samples for Symbicort, Brilinta, as well as Crestor.  I also recommended pulmonary evaluation and he.  He was evaluated by Dr.Wert on May 1.  He was taken off metoprolol and started on Bystolic 10 mg.  He still notes chest pain when he coughs.  He has not had his echo yet since he is waiting to get Medicaid.  He never did start the angiotensin receptor blocker.  He was recently evaluated in the emergency room on April 29 and apparently was started on prednisone and albuterol. He presents for evaluation.   Past Medical History  Diagnosis Date  . Hypokalemia, replaced 06/19/2013  . Hypomagnesemia, replaced 06/19/2013  . Cardiac arrest, hypothermic protocol 06/12/2013  . NSTEMI (non-ST elevated myocardial infarction), DES to RCA 06/13/2013  . COPD exacerbation 06/12/2013  . Hyperlipidemia LDL goal < 70 06/19/2013  . Hypotension, requiring levophed 06/19/2013  . Bronchitis, chronic 06/19/2013  . Tobacco abuse 06/19/2013  . Altered mental status, improved at discharge 06/19/2013    Past Surgical History  Procedure Laterality Date  . No past surgeries    . Cardiac sstent    . Coronary angioplasty with stent placement  06/12/13    Xience Alpine DES to RCA, NSTEMI    Allergies  Allergen Reactions  . Aspirin Swelling    To lips.  . Ibuprofen Swelling    To lips.    Current Outpatient Prescriptions  Medication Sig Dispense Refill  .  acetaminophen (TYLENOL) 325 MG tablet Take 2 tablets (650 mg total) by mouth every 4 (four) hours as needed for headache or mild pain.      Marland Kitchen albuterol (VENTOLIN HFA) 108 (90 BASE) MCG/ACT inhaler Inhale 2 puffs into the lungs every 6 (six) hours as needed for wheezing or shortness of breath.      . nitroGLYCERIN (NITROSTAT) 0.4 MG SL tablet Place 1 tablet (0.4 mg total) under the tongue every 5 (five) minutes as needed for chest pain.  25 tablet  4  .  rosuvastatin (CRESTOR) 20 MG tablet Take 2 tablets (40 mg total) by mouth daily.  30 tablet  6  . Ticagrelor (BRILINTA) 90 MG TABS tablet Take 1 tablet (90 mg total) by mouth 2 (two) times daily.  60 tablet  6  . bisoprolol (ZEBETA) 5 MG tablet Take 1 tablet (5 mg total) by mouth daily.  30 tablet  11  . budesonide-formoterol (SYMBICORT) 160-4.5 MCG/ACT inhaler Take 2 puffs first thing in am and then another 2 puffs about 12 hours later.  1 Inhaler  12  . losartan (COZAAR) 25 MG tablet Take 1 tablet (25 mg total) by mouth daily.  30 tablet  5   No current facility-administered medications for this visit.    History   Social History  . Marital Status: Divorced    Spouse Name: N/A    Number of Children: N/A  . Years of Education: N/A   Occupational History  . Not on file.   Social History Main Topics  . Smoking status: Former Smoker -- 2.00 packs/day for 38 years    Types: Cigarettes    Quit date: 07/10/2008  . Smokeless tobacco: Never Used  . Alcohol Use: No  . Drug Use: No  . Sexual Activity: Yes   Other Topics Concern  . Not on file   Social History Narrative  . No narrative on file   Socially, he has a previous 30 year history of smoking, but quit smoking 5 years ago.  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  History reviewed. No pertinent family history.   ROS General: Negative; No fevers, chills, or night sweats;  HEENT: Negative; No changes in vision or hearing, sinus congestion, difficulty swallowing Pulmonary: Positive for intermittent wheezing and cough, wheezing, shortness of breath, no hemoptysis Cardiovascular: Negative; No chest pain, presyncope, syncope, palpatations, no edema GI: Negative; No nausea, vomiting, diarrhea, or abdominal pain GU: Negative; No dysuria, hematuria, or difficulty voiding Musculoskeletal: Negative; no myalgias, joint pain, or weakness Hematologic/Oncology: Negative; no easy bruising, bleeding Endocrine: Negative; no heat/cold  intolerance; no diabetes Neuro: Negative; no changes in balance, headaches Skin: Negative; No rashes or skin lesions Psychiatric: Negative; No behavioral problems, depression Sleep: Negative; No snoring, daytime sleepiness, hypersomnolence, bruxism, restless legs, hypnogognic hallucinations, no cataplexy Other comprehensive 14 point system review is negative.   PE BP 112/68  Pulse 69  Ht 5\' 6"  (1.676 m)  Wt 132 lb 1.6 oz (59.92 kg)  BMI 21.33 kg/m2  General: Alert, oriented, no distress.  Skin: normal turgor, no rashes HEENT: Normocephalic, atraumatic. Pupils round and reactive; sclera anicteric;no lid lag. Extraocular muscles intact;; no xanthelasmas. Nose without nasal septal hypertrophy Mouth/Parynx benign; Mallinpatti scale 3 Neck: No JVD, no carotid bruits; normal carotid upstroke Lungs: Decreased breath sounds; wheezing, improved from the last evaluation Chest wall: no tenderness to palpitation Heart: RRR, s1 s2 normal; faint 1/6 systolic murmur;no diastolic murmur, rub thrills or heaves Abdomen: soft, nontender; no  hepatosplenomehaly, BS+; abdominal aorta nontender and not dilated by palpation. Back: no CVA tenderness Pulses 2+ Extremities: no clubbing cyanosis or edema, Homan's sign negative  Neurologic: grossly nonfocal; cranial nerves grossly normal. Psychologic: normal affect and mood.  ECG (independently read by me): Normal sinus rhythm at 69 beats per minute.  A small Q wave in aVL.  Otherwise, no ECG evidence for recent myocardial infarction  Prior ECG (independently read by me) normal sinus rhythm at 72 beats per minute. QS in V1 and V2.  Prior 07/10/2013 ECG (independently read by me): Normal sinus rhythm 74 beats per minute. Normal intervals. No ECG evidence of his recent myocardial infarction  LABS:  BMET    Component Value Date/Time   NA 138 08/30/2013 1940   K 4.1 08/30/2013 1940   CL 98 08/30/2013 1940   CO2 23 08/30/2013 1940   GLUCOSE 105* 08/30/2013  1940   BUN 13 08/30/2013 1940   CREATININE 0.74 08/30/2013 1940   CREATININE 0.81 07/21/2013 0907   CALCIUM 10.3 08/30/2013 1940   GFRNONAA >90 08/30/2013 1940   GFRAA >90 08/30/2013 1940     Hepatic Function Panel     Component Value Date/Time   PROT 7.7 07/21/2013 0907   ALBUMIN 4.5 07/21/2013 0907   AST 16 07/21/2013 0907   ALT 23 07/21/2013 0907   ALKPHOS 78 07/21/2013 0907   BILITOT 0.4 07/21/2013 0907     CBC    Component Value Date/Time   WBC 13.6* 08/30/2013 1940   RBC 5.36 08/30/2013 1940   HGB 17.2* 08/30/2013 1940   HCT 49.0 08/30/2013 1940   PLT 322 08/30/2013 1940   MCV 91.4 08/30/2013 1940   MCH 32.1 08/30/2013 1940   MCHC 35.1 08/30/2013 1940   RDW 13.5 08/30/2013 1940   LYMPHSABS 1.3 06/12/2013 0638   MONOABS 2.7* 06/12/2013 0638   EOSABS 0.0 06/12/2013 0638   BASOSABS 0.0 06/12/2013 0638     BNP No results found for this basename: probnp    Lipid Panel     Component Value Date/Time   CHOL 135 07/21/2013 0907   TRIG 197* 07/21/2013 0907   HDL 38* 07/21/2013 0907   CHOLHDL 3.6 07/21/2013 0907   VLDL 39 07/21/2013 0907   LDLCALC 58 07/21/2013 0907     RADIOLOGY: No results found.    ASSESSMENT AND PLAN: Mr. Dustan Hyams is 3 month status post his out of hospital cardiac arrest which time he was found to be in VF and required successful defibrillation and CPR institution prior to arrival to the hospital. I took him acutely to the catheterization laboratory and he was found to have total occlusion of the mid RCA which was successfully intervened upon with restoration of TIMI-3 flow. Initial ejection fraction was 50-55% with mild inferior hypocontractility. He has been without recurrent chest pain symptoms. His laboratory was reviewed. LDL cholesterol is 58.  When I last seen him, he had run out of the majority of his medications.  He is now back on dual antiplatelet therapy.  He is tolerating Symbicort with improvement from a significant prior wheezing.  I am recommending  institution of low-dose ARB therapy with losartan 25 mg post myocardial infarction.  He has been on low-dose steroids.  He is tolerating resumption of Crestor without myalgias.  I will again schedule him for an echo Doppler study and this will be scheduled for after he gets his Medicaid.  We discussed increased activity.  He will have a followup pulmonary evaluation.  I will see him in followup.  Cardiology evaluation in 3 months.    Troy Sine, MD, [facility]  09/15/2013 1:13 PM

## 2013-09-17 ENCOUNTER — Encounter: Payer: Self-pay | Admitting: Internal Medicine

## 2013-09-17 NOTE — Assessment & Plan Note (Signed)
-   HFA 75% p coaching 09/01/13 > started on symbicort 160 2 bid  - Spirometry 09/15/13  FEV1 1.67 (49%) ratio 44 %  Adequate control on present rx, reviewed > no change in rx needed

## 2013-10-19 ENCOUNTER — Telehealth: Payer: Self-pay | Admitting: Cardiovascular Disease

## 2013-10-19 NOTE — Telephone Encounter (Signed)
Samples up front pt. Called and informed

## 2013-10-19 NOTE — Telephone Encounter (Signed)
Pr would like some samples of Brilinta 90 mg and Crestor 20 mg please. Please call and let him know if you have the samples.

## 2013-11-01 ENCOUNTER — Telehealth: Payer: Self-pay | Admitting: Cardiovascular Disease

## 2013-11-01 MED ORDER — ROSUVASTATIN CALCIUM 20 MG PO TABS: 40.0000 mg | ORAL_TABLET | Freq: Every day | ORAL | Status: DC

## 2013-11-01 NOTE — Telephone Encounter (Signed)
New Prob   Pt is requesting some samples of Brilinta 90 mg and Crestor 20 mg.

## 2013-11-01 NOTE — Telephone Encounter (Signed)
Samples of crestor provided. Patient will call back about Brilinta.

## 2013-11-02 ENCOUNTER — Telehealth: Payer: Self-pay | Admitting: *Deleted

## 2013-11-02 NOTE — Telephone Encounter (Signed)
Brilinat 90 mg, 64 tablets, Lot [id number], exp 9/17 left at front desk.

## 2013-11-02 NOTE — Telephone Encounter (Signed)
Encounter complete. 

## 2013-11-02 NOTE — Telephone Encounter (Signed)
Patient stopped by the office for samples [facility]  RN informed patient no samples are available today. Not sure when any will be come available. RN informed patient that a prescription was sent into [facility] n April 2015.  Patient asked how much is Rx. He states he does not have any money , he lost his job the first of the year.  RN called [facility]--spoke  Pat.  Samples are available there. She will leave some samples for him at the front desk.  RN informed patient. Instruction on how to get to [facility]. Patient voiced understanding.

## 2013-11-06 ENCOUNTER — Ambulatory Visit (HOSPITAL_COMMUNITY)
Admission: RE | Admit: 2013-11-06 | Discharge: 2013-11-06 | Disposition: A | Discharge status: Home / Self Care | Payer: Medicaid Other | Source: Ambulatory Visit | Attending: Cardiology | Admitting: Cardiology

## 2013-11-06 DIAGNOSIS — I359 Nonrheumatic aortic valve disorder, unspecified: Secondary | ICD-10-CM | POA: Diagnosis not present

## 2013-11-06 DIAGNOSIS — I469 Cardiac arrest, cause unspecified: Secondary | ICD-10-CM | POA: Insufficient documentation

## 2013-11-06 NOTE — Progress Notes (Signed)
2D Echo Performed 11/06/2013    Marygrace Drought, RCS

## 2013-11-08 ENCOUNTER — Encounter: Payer: Self-pay | Admitting: *Deleted

## 2013-12-06 ENCOUNTER — Encounter: Payer: Self-pay | Admitting: Cardiovascular Disease

## 2013-12-06 ENCOUNTER — Ambulatory Visit (INDEPENDENT_AMBULATORY_CARE_PROVIDER_SITE_OTHER): Payer: No Typology Code available for payment source | Admitting: Cardiovascular Disease

## 2013-12-06 VITALS — BP 124/73 | HR 69 | Ht 66.0 in | Wt 131.6 lb

## 2013-12-06 DIAGNOSIS — J449 Chronic obstructive pulmonary disease, unspecified: Secondary | ICD-10-CM

## 2013-12-06 DIAGNOSIS — I469 Cardiac arrest, cause unspecified: Secondary | ICD-10-CM

## 2013-12-06 DIAGNOSIS — I2510 Atherosclerotic heart disease of native coronary artery without angina pectoris: Secondary | ICD-10-CM

## 2013-12-06 DIAGNOSIS — E785 Hyperlipidemia, unspecified: Secondary | ICD-10-CM

## 2013-12-06 NOTE — Progress Notes (Signed)
Patient ID: Timothy [name], male   DOB: [birth year], 58 y.o.   MRN: [id number]     HPI: Timothy [name] is a 58 y.o. male presents for follow-up cardiology evaluation.  He is status post out of hospital hospital ventricular fibrillation cardiac arrest and successful cardiac catheterization and percutaneous coronary intervention.  I last saw him 3 months ago.  [name] presented to [facility] in the early morning of 06/12/2013 after being found down at home. When EMS arrived he was in ventricular fibrillation. He was transported acutely to [facility]  cardiac catheterization laboratory and hypothermia protocol was implemented. Emergent catheterization revealed total occlusion of the RCA and he underwent successful intervention with ultimate insertion of a 2.5x18 mm  post dilated to 2.75 mm with the percent occlusion reduced to 0% and with resumption of brisk TIMI 3 flow. He ultimately awoke and was intact neurologically after the hypothermia protocol completed his course. He did require levophed initially for hypotension. He was discharged on 06/19/2013.   Since discharge, he denies recurrent chest pain. He denies PND orthopnea. He was sent home on aspirin and brilinta for dual anti-platelet therapy, metoprolol 75 mg twice a day lisinopril 2.5 mg, Crestor 20 mg. 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 At that time, I further titrate his metoprolol tartrate to 100 mg twice a day. I recommended laboratory be checked in the fasting state. 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 [name] never did undergo his studies. He stated that he was unable to afford the cost since his insurance has not yet become effective.  When I last saw him, did run out of his early the and I re-instituted therapy.  He also was on an ACE inhibitor and had significant wheezing and I recommended discontinuance of the ACE inhibitor.  He  was ultimately started on losartan 25 mg.  He is now on bisoprolol 5 mg and tolerating this well from a pulmonary perspective.  He also is on Symbicort 160/4.5 and when necessary Ventolin without recent wheezing.  He underwent a recent echo Doppler study on 11/06/2013.  This is essentially normal.  Ejection fraction is 55-60% and he did not have any regional wall motion abnormality.  PA pressure was minimally elevated at 32 mm.  He denies recurrent chest pain.  Denies recent wheezing.  He denies palpitations.  Past Medical History  Diagnosis Date  . Hypokalemia, replaced 06/19/2013  . Hypomagnesemia, replaced 06/19/2013  . Cardiac arrest, hypothermic protocol 06/12/2013  . NSTEMI (non-ST elevated myocardial infarction), DES to RCA 06/13/2013  . COPD exacerbation 06/12/2013  . Hyperlipidemia LDL goal < 70 06/19/2013  . Hypotension, requiring levophed 06/19/2013  . Bronchitis, chronic 06/19/2013  . Tobacco abuse 06/19/2013  . Altered mental status, improved at discharge 06/19/2013    Past Surgical History  Procedure Laterality Date  . No past surgeries    . Cardiac sstent    . Coronary angioplasty with stent placement  06/12/13    Xience Alpine DES to RCA, NSTEMI    Allergies  Allergen Reactions  . Aspirin Swelling    To lips.  . Ibuprofen Swelling    To lips.    Current Outpatient Prescriptions  Medication Sig Dispense Refill  . acetaminophen (TYLENOL) 325 MG tablet Take 2 tablets (650 mg total) by mouth every 4 (four) hours as needed for headache or mild pain.      Marland Kitchen  albuterol (VENTOLIN HFA) 108 (90 BASE) MCG/ACT inhaler Inhale 2 puffs into the lungs every 6 (six) hours as needed for wheezing or shortness of breath.      Marland Kitchen aspirin EC 81 MG tablet Take 81 mg by mouth daily.      . bisoprolol (ZEBETA) 5 MG tablet Take 1 tablet (5 mg total) by mouth daily.  30 tablet  11  . budesonide-formoterol (SYMBICORT) 160-4.5 MCG/ACT inhaler Take 2 puffs first thing in am and then another 2 puffs about  12 hours later.  1 Inhaler  12  . losartan (COZAAR) 25 MG tablet Take 1 tablet (25 mg total) by mouth daily.  30 tablet  5  . nitroGLYCERIN (NITROSTAT) 0.4 MG SL tablet Place 1 tablet (0.4 mg total) under the tongue every 5 (five) minutes as needed for chest pain.  25 tablet  4  . rosuvastatin (CRESTOR) 20 MG tablet Take 2 tablets (40 mg total) by mouth daily.  42 tablet  0  . Ticagrelor (BRILINTA) 90 MG TABS tablet Take 1 tablet (90 mg total) by mouth 2 (two) times daily.  60 tablet  6   No current facility-administered medications for this visit.    History   Social History  . Marital Status: Divorced    Spouse Name: N/A    Number of Children: N/A  . Years of Education: N/A   Occupational History  . Not on file.   Social History Main Topics  . Smoking status: Former Smoker -- 2.00 packs/day for 38 years    Types: Cigarettes    Quit date: 07/10/2008  . Smokeless tobacco: Never Used  . Alcohol Use: No  . Drug Use: No  . Sexual Activity: Yes   Other Topics Concern  . Not on file   Social History Narrative  . No narrative on file   Socially, he has a previous 30 year history of smoking, but quit smoking 5 years ago.  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  History reviewed. No pertinent family history.   ROS General: Negative; No fevers, chills, or night sweats;  HEENT: Negative; No changes in vision or hearing, sinus congestion, difficulty swallowing Pulmonary: No recent wheezing, shortness of breath, no hemoptysis Cardiovascular: Negative; No chest pain, presyncope, syncope, palpatations, no edema GI: Negative; No nausea, vomiting, diarrhea, or abdominal pain GU: Negative; No dysuria, hematuria, or difficulty voiding Musculoskeletal: Negative; no myalgias, joint pain, or weakness Hematologic/Oncology: Negative; no easy bruising, bleeding Endocrine: Negative; no heat/cold intolerance; no diabetes Neuro: Negative; no changes in balance, headaches Skin: Negative; No  rashes or skin lesions Psychiatric: Negative; No behavioral problems, depression Sleep: Negative; No snoring, daytime sleepiness, hypersomnolence, bruxism, restless legs, hypnogognic hallucinations, no cataplexy Other comprehensive 14 point system review is negative.   PE BP 124/73  Pulse 69  Ht 5\' 6"  (1.676 m)  Wt 131 lb 9.6 oz (59.693 kg)  BMI 21.25 kg/m2  General: Alert, oriented, no distress.  Skin: normal turgor, no rashes HEENT: Normocephalic, atraumatic. Pupils round and reactive; sclera anicteric;no lid lag. Extraocular muscles intact;; no xanthelasmas. Nose without nasal septal hypertrophy Mouth/Parynx benign; Mallinpatti scale 3 Neck: No JVD, no carotid bruits; normal carotid upstroke Lungs: Decreased breath sounds; no wheezing.  Chest wall: no tenderness to palpitation Heart: RRR, s1 s2 normal; faint 1/6 systolic murmur;no diastolic murmur, rub thrills or heaves Abdomen: soft, nontender; no hepatosplenomehaly, BS+; abdominal aorta nontender and not dilated by palpation. Back: no CVA tenderness Pulses 2+ Extremities: no clubbing cyanosis  or edema, Homan's sign negative  Neurologic: grossly nonfocal; cranial nerves grossly normal. Psychologic: normal affect and mood.  ECG (independently read by me): Normal sinus rhythm at 69 beats per minute.  Normal intervals.  Normal EKG   Prior 09/04/2013 ECG (independently read by me): Normal sinus rhythm at 69 beats per minute.  A small Q wave in aVL.  Otherwise, no ECG evidence for recent myocardial infarction  Prior ECG (independently read by me) normal sinus rhythm at 72 beats per minute. QS in V1 and V2.  Prior 07/10/2013 ECG (independently read by me): Normal sinus rhythm 74 beats per minute. Normal intervals. No ECG evidence of his recent myocardial infarction  LABS:  BMET    Component Value Date/Time   NA 138 08/30/2013 1940   K 4.1 08/30/2013 1940   CL 98 08/30/2013 1940   CO2 23 08/30/2013 1940   GLUCOSE 105* 08/30/2013  1940   BUN 13 08/30/2013 1940   CREATININE 0.74 08/30/2013 1940   CREATININE 0.81 07/21/2013 0907   CALCIUM 10.3 08/30/2013 1940   GFRNONAA >90 08/30/2013 1940   GFRAA >90 08/30/2013 1940     Hepatic Function Panel     Component Value Date/Time   PROT 7.7 07/21/2013 0907   ALBUMIN 4.5 07/21/2013 0907   AST 16 07/21/2013 0907   ALT 23 07/21/2013 0907   ALKPHOS 78 07/21/2013 0907   BILITOT 0.4 07/21/2013 0907     CBC    Component Value Date/Time   WBC 13.6* 08/30/2013 1940   RBC 5.36 08/30/2013 1940   HGB 17.2* 08/30/2013 1940   HCT 49.0 08/30/2013 1940   PLT 322 08/30/2013 1940   MCV 91.4 08/30/2013 1940   MCH 32.1 08/30/2013 1940   MCHC 35.1 08/30/2013 1940   RDW 13.5 08/30/2013 1940   LYMPHSABS 1.3 06/12/2013 0638   MONOABS 2.7* 06/12/2013 0638   EOSABS 0.0 06/12/2013 0638   BASOSABS 0.0 06/12/2013 0638     BNP No results found for this basename: probnp    Lipid Panel     Component Value Date/Time   CHOL 135 07/21/2013 0907   TRIG 197* 07/21/2013 0907   HDL 38* 07/21/2013 0907   CHOLHDL 3.6 07/21/2013 0907   VLDL 39 07/21/2013 0907   LDLCALC 58 07/21/2013 0907     RADIOLOGY: No results found.    ASSESSMENT AND PLAN: Mr. Captain Blucher is 6 month status post his out of hospital cardiac arrest which time he was found to be in VF and required successful defibrillation and CPR institution prior to arrival to the hospital. I took him acutely to the catheterization laboratory and he was found to have total occlusion of the mid RCA which was successfully intervened upon with restoration of TIMI-3 flow. Initial ejection fraction was 50-55% with mild inferior hypocontractility. He has been without recurrent chest pain symptoms.  His most recent echo Doppler study shows normalization of LV function and he does not have any regional wall motion abnormalities.  His blood pressure today is well controlled on his current regimen, consisting of low-dose losartan in addition to bisoprolol.  He continues  to be on dual antiplatelet therapy with aspirin and Brilinta.  He is no longer wheezing and is tolerating Symbicort in addition to when necessary albuterol.  He'll continue current therapy.  I'll see him in 6 months for cardiology reevaluation.   Troy Sine, MD, [facility]  12/06/2013 7:29 PM

## 2013-12-06 NOTE — Patient Instructions (Signed)
Your physician recommends that you schedule a follow-up appointment in: 6 MONTHS. NO CHANGES HAVE BEEN MADE TODAY IN YOUR THERAPY.

## 2013-12-29 ENCOUNTER — Ambulatory Visit: Payer: Medicaid Other | Attending: Internal Medicine | Admitting: Internal Medicine

## 2013-12-29 ENCOUNTER — Encounter: Payer: Self-pay | Admitting: Internal Medicine

## 2013-12-29 VITALS — BP 137/87 | HR 63 | Temp 98.7°F | Resp 16 | Wt 127.4 lb

## 2013-12-29 DIAGNOSIS — I10 Essential (primary) hypertension: Secondary | ICD-10-CM

## 2013-12-29 DIAGNOSIS — R7303 Prediabetes: Secondary | ICD-10-CM | POA: Insufficient documentation

## 2013-12-29 DIAGNOSIS — K029 Dental caries, unspecified: Secondary | ICD-10-CM | POA: Diagnosis not present

## 2013-12-29 DIAGNOSIS — I2510 Atherosclerotic heart disease of native coronary artery without angina pectoris: Secondary | ICD-10-CM | POA: Diagnosis not present

## 2013-12-29 DIAGNOSIS — Z87891 Personal history of nicotine dependence: Secondary | ICD-10-CM | POA: Insufficient documentation

## 2013-12-29 DIAGNOSIS — E785 Hyperlipidemia, unspecified: Secondary | ICD-10-CM | POA: Insufficient documentation

## 2013-12-29 DIAGNOSIS — R7309 Other abnormal glucose: Secondary | ICD-10-CM | POA: Insufficient documentation

## 2013-12-29 DIAGNOSIS — J449 Chronic obstructive pulmonary disease, unspecified: Secondary | ICD-10-CM

## 2013-12-29 DIAGNOSIS — J4489 Other specified chronic obstructive pulmonary disease: Secondary | ICD-10-CM | POA: Insufficient documentation

## 2013-12-29 MED ORDER — BUDESONIDE-FORMOTEROL FUMARATE 160-4.5 MCG/ACT IN AERO: INHALATION_SPRAY | RESPIRATORY_TRACT | Status: DC

## 2013-12-29 MED ORDER — TICAGRELOR 90 MG PO TABS: 90.0000 mg | ORAL_TABLET | Freq: Two times a day (BID) | ORAL | Status: DC

## 2013-12-29 MED ORDER — ROSUVASTATIN CALCIUM 40 MG PO TABS: 40.0000 mg | ORAL_TABLET | Freq: Every day | ORAL | Status: DC

## 2013-12-29 NOTE — Patient Instructions (Signed)
Diabetes Mellitus and Food It is important for you to manage your blood sugar (glucose) level. Your blood glucose level can be greatly affected by what you eat. Eating healthier foods in the appropriate amounts throughout the day at about the same time each day will help you control your blood glucose level. It can also help slow or prevent worsening of your diabetes mellitus. Healthy eating may even help you improve the level of your blood pressure and reach or maintain a healthy weight.  HOW CAN FOOD AFFECT ME? Carbohydrates Carbohydrates affect your blood glucose level more than any other type of food. Your dietitian will help you determine how many carbohydrates to eat at each meal and teach you how to count carbohydrates. Counting carbohydrates is important to keep your blood glucose at a healthy level, especially if you are using insulin or taking certain medicines for diabetes mellitus. Alcohol Alcohol can cause sudden decreases in blood glucose (hypoglycemia), especially if you use insulin or take certain medicines for diabetes mellitus. Hypoglycemia can be a life-threatening condition. Symptoms of hypoglycemia (sleepiness, dizziness, and disorientation) are similar to symptoms of having too much alcohol.  If your health care provider has given you approval to drink alcohol, do so in moderation and use the following guidelines:  Women should not have more than one drink per day, and men should not have more than two drinks per day. One drink is equal to:  12 oz of beer.  5 oz of wine.  1 oz of hard liquor.  Do not drink on an empty stomach.  Keep yourself hydrated. Have water, diet soda, or unsweetened iced tea.  Regular soda, juice, and other mixers might contain a lot of carbohydrates and should be counted. WHAT FOODS ARE NOT RECOMMENDED? As you make food choices, it is important to remember that all foods are not the same. Some foods have fewer nutrients per serving than other  foods, even though they might have the same number of calories or carbohydrates. It is difficult to get your body what it needs when you eat foods with fewer nutrients. Examples of foods that you should avoid that are high in calories and carbohydrates but low in nutrients include:  Trans fats (most processed foods list trans fats on the Nutrition Facts label).  Regular soda.  Juice.  Candy.  Sweets, such as cake, pie, doughnuts, and cookies.  Fried foods. WHAT FOODS CAN I EAT? Have nutrient-rich foods, which will nourish your body and keep you healthy. The food you should eat also will depend on several factors, including:  The calories you need.  The medicines you take.  Your weight.  Your blood glucose level.  Your blood pressure level.  Your cholesterol level. You also should eat a variety of foods, including:  Protein, such as meat, poultry, fish, tofu, nuts, and seeds (lean animal proteins are best).  Fruits.  Vegetables.  Dairy products, such as milk, cheese, and yogurt (low fat is best).  Breads, grains, pasta, cereal, rice, and beans.  Fats such as olive oil, trans fat-free margarine, canola oil, avocado, and olives. DOES EVERYONE WITH DIABETES MELLITUS HAVE THE SAME MEAL PLAN? Because every person with diabetes mellitus is different, there is not one meal plan that works for everyone. It is very important that you meet with a dietitian who will help you create a meal plan that is just right for you. Document Released: 01/15/2005 Document Revised: 04/25/2013 Document Reviewed: 03/17/2013 Exit[employer] Patient Information 2015 [employer]. This   information is not intended to replace advice given to you by your health care provider. Make sure you discuss any questions you have with your health care provider.  

## 2013-12-29 NOTE — Progress Notes (Signed)
Patient Demographics  Timothy [name], is a 58 y.o. male  ZCH:[id number]  OIN:[id number]  DOB - [birth year]  CC:  Chief Complaint  Patient presents with  . Establish Care       HPI: Timothy [name] is a 58 y.o. male here today to establish medical care.Patient has history of COPD, CAD, history of V. fib cardiac arrest status post cardiac cath and PCI currently following up with  pulmonologist and cardiologist, patient denies smoking cigarettes any more, patient is requesting prescription for his medication because he needs assistance with getting these medication including Crestor Brilinta and Symbicort. Patient also has dental cavities and is requesting referral to see a dentist. Patient had a blood work done in the past reviewed and noticed his hemoglobin A1c 5.9%. Patient has No headache, No chest pain, No abdominal pain - No Nausea, No new weakness tingling or numbness, No Cough - SOB.  Allergies  Allergen Reactions  . Aspirin Swelling    To lips.  . Ibuprofen Swelling    To lips.   Past Medical History  Diagnosis Date  . Hypokalemia, replaced 06/19/2013  . Hypomagnesemia, replaced 06/19/2013  . Cardiac arrest, hypothermic protocol 06/12/2013  . NSTEMI (non-ST elevated myocardial infarction), DES to RCA 06/13/2013  . COPD exacerbation 06/12/2013  . Hyperlipidemia LDL goal < 70 06/19/2013  . Hypotension, requiring levophed 06/19/2013  . Bronchitis, chronic 06/19/2013  . Tobacco abuse 06/19/2013  . Altered mental status, improved at discharge 06/19/2013   Current Outpatient Prescriptions on File Prior to Visit  Medication Sig Dispense Refill  . acetaminophen (TYLENOL) 325 MG tablet Take 2 tablets (650 mg total) by mouth every 4 (four) hours as needed for headache or mild pain.      Marland Kitchen albuterol (VENTOLIN HFA) 108 (90 BASE) MCG/ACT inhaler Inhale 2 puffs into the lungs every 6 (six) hours as needed for wheezing or shortness of breath.      Marland Kitchen aspirin EC 81 MG tablet Take 81 mg by mouth  daily.      . bisoprolol (ZEBETA) 5 MG tablet Take 1 tablet (5 mg total) by mouth daily.  30 tablet  11  . losartan (COZAAR) 25 MG tablet Take 1 tablet (25 mg total) by mouth daily.  30 tablet  5  . nitroGLYCERIN (NITROSTAT) 0.4 MG SL tablet Place 1 tablet (0.4 mg total) under the tongue every 5 (five) minutes as needed for chest pain.  25 tablet  4   No current facility-administered medications on file prior to visit.   History reviewed. No pertinent family history. History   Social History  . Marital Status: Divorced    Spouse Name: N/A    Number of Children: N/A  . Years of Education: N/A   Occupational History  . Not on file.   Social History Main Topics  . Smoking status: Former Smoker -- 2.00 packs/day for 38 years    Types: Cigarettes    Quit date: 07/10/2008  . Smokeless tobacco: Never Used  . Alcohol Use: No  . Drug Use: No  . Sexual Activity: Yes   Other Topics Concern  . Not on file   Social History Narrative  . No narrative on file    Review of Systems: Constitutional: Negative for fever, chills, diaphoresis, activity change, appetite change and fatigue. HENT: Negative for ear pain, nosebleeds, congestion, facial swelling, rhinorrhea, neck pain, neck stiffness and ear discharge.  Eyes: Negative for pain, discharge, redness, itching and visual disturbance. Respiratory: Negative for cough,  choking, chest tightness, shortness of breath, wheezing and stridor.  Cardiovascular: Negative for chest pain, palpitations and leg swelling. Gastrointestinal: Negative for abdominal distention. Genitourinary: Negative for dysuria, urgency, frequency, hematuria, flank pain, decreased urine volume, difficulty urinating and dyspareunia.  Musculoskeletal: Negative for back pain, joint swelling, arthralgia and gait problem. Neurological: Negative for dizziness, tremors, seizures, syncope, facial asymmetry, speech difficulty, weakness, light-headedness, numbness and headaches.    Hematological: Negative for adenopathy. Does not bruise/bleed easily. Psychiatric/Behavioral: Negative for hallucinations, behavioral problems, confusion, dysphoric mood, decreased concentration and agitation.    Objective:   Filed Vitals:   12/29/13 1402  BP: 137/87  Pulse: 63  Temp: 98.7 F (37.1 C)  Resp: 16    Physical Exam: Constitutional: Patient appears well-developed and well-nourished. No distress. HENT: Normocephalic, atraumatic, External right and left ear normal. Oropharynx is clear and moist.dental cavities Eyes: Conjunctivae and EOM are normal. PERRLA, no scleral icterus. Neck: Normal ROM. Neck supple. No JVD. No tracheal deviation. No thyromegaly. CVS: RRR, S1/S2 +, no murmurs, no gallops, no carotid bruit.  Pulmonary: Effort and breath sounds normal, no stridor, rhonchi, wheezes, rales.  Abdominal: Soft. BS +, no distension, tenderness, rebound or guarding.  Musculoskeletal: Normal range of motion. No edema and no tenderness.  Neuro: Alert. Normal reflexes, muscle tone coordination. No cranial nerve deficit. Skin: Skin is warm and dry. No rash noted. Not diaphoretic. No erythema. No pallor. Psychiatric: Normal mood and affect. Behavior, judgment, thought content normal.  Lab Results  Component Value Date   WBC 13.6* 08/30/2013   HGB 17.2* 08/30/2013   HCT 49.0 08/30/2013   MCV 91.4 08/30/2013   PLT 322 08/30/2013   Lab Results  Component Value Date   CREATININE 0.74 08/30/2013   BUN 13 08/30/2013   NA 138 08/30/2013   K 4.1 08/30/2013   CL 98 08/30/2013   CO2 23 08/30/2013    Lab Results  Component Value Date   HGBA1C 5.9* 06/12/2013   Lipid Panel     Component Value Date/Time   CHOL 135 07/21/2013 0907   TRIG 197* 07/21/2013 0907   HDL 38* 07/21/2013 0907   CHOLHDL 3.6 07/21/2013 0907   VLDL 39 07/21/2013 0907   LDLCALC 58 07/21/2013 0907       Assessment and plan:   1. Essential hypertension Blood pressure is controlled continue with current meds.    2. COPD GOLD III Patient following up with pulmonology this medication refill. - budesonide-formoterol (SYMBICORT) 160-4.5 MCG/ACT inhaler; Take 2 puffs first thing in am and then another 2 puffs about 12 hours later.  Dispense: 1 Inhaler; Refill: 12  3. Coronary artery disease involving native coronary artery of native heart without angina pectoris Patient following up with the cardiologist. - ticagrelor (BRILINTA) 90 MG TABS tablet; Take 1 tablet (90 mg total) by mouth 2 (two) times daily.  Dispense: 60 tablet; Refill: 6  4. Hyperlipidemia with target LDL less than 70 Continue with  Crestor, will repeat lipid panel in 3 months. - rosuvastatin (CRESTOR) 40 MG tablet; Take 1 tablet (40 mg total) by mouth daily.  Dispense: 30 tablet; Refill: 11  5. Prediabetes Advised patient for low carbohydrate diet. Will repeat A1c in 3 months.  6. Dental cavities  - Ambulatory referral to Dentistry  Return in about 3 months (around 03/31/2014).  Lorayne Marek, MD

## 2013-12-29 NOTE — Progress Notes (Signed)
Patient is here to establish care Has a history of COPD and CAD Patient is a former smoker quit five years ago

## 2013-12-31 ENCOUNTER — Encounter (HOSPITAL_COMMUNITY): Payer: Self-pay | Admitting: Emergency Medicine

## 2013-12-31 ENCOUNTER — Emergency Department (HOSPITAL_COMMUNITY): Payer: Medicaid Other

## 2013-12-31 ENCOUNTER — Inpatient Hospital Stay (HOSPITAL_COMMUNITY)
Admission: EM | Admit: 2013-12-31 | Discharge: 2014-01-03 | DRG: 190 | Disposition: A | Discharge status: Home / Self Care | Payer: Medicaid Other | Attending: Internal Medicine | Admitting: Internal Medicine

## 2013-12-31 DIAGNOSIS — I10 Essential (primary) hypertension: Secondary | ICD-10-CM | POA: Diagnosis present

## 2013-12-31 DIAGNOSIS — Z9861 Coronary angioplasty status: Secondary | ICD-10-CM

## 2013-12-31 DIAGNOSIS — Z79899 Other long term (current) drug therapy: Secondary | ICD-10-CM

## 2013-12-31 DIAGNOSIS — E876 Hypokalemia: Secondary | ICD-10-CM

## 2013-12-31 DIAGNOSIS — I472 Ventricular tachycardia, unspecified: Secondary | ICD-10-CM | POA: Diagnosis present

## 2013-12-31 DIAGNOSIS — J441 Chronic obstructive pulmonary disease with (acute) exacerbation: Principal | ICD-10-CM | POA: Diagnosis present

## 2013-12-31 DIAGNOSIS — Z87891 Personal history of nicotine dependence: Secondary | ICD-10-CM | POA: Diagnosis present

## 2013-12-31 DIAGNOSIS — K029 Dental caries, unspecified: Secondary | ICD-10-CM

## 2013-12-31 DIAGNOSIS — I214 Non-ST elevation (NSTEMI) myocardial infarction: Secondary | ICD-10-CM | POA: Diagnosis present

## 2013-12-31 DIAGNOSIS — R0609 Other forms of dyspnea: Secondary | ICD-10-CM | POA: Diagnosis present

## 2013-12-31 DIAGNOSIS — E785 Hyperlipidemia, unspecified: Secondary | ICD-10-CM | POA: Diagnosis present

## 2013-12-31 DIAGNOSIS — I2510 Atherosclerotic heart disease of native coronary artery without angina pectoris: Secondary | ICD-10-CM | POA: Diagnosis present

## 2013-12-31 DIAGNOSIS — R0602 Shortness of breath: Secondary | ICD-10-CM | POA: Diagnosis present

## 2013-12-31 DIAGNOSIS — Z720 Tobacco use: Secondary | ICD-10-CM | POA: Diagnosis present

## 2013-12-31 DIAGNOSIS — R7309 Other abnormal glucose: Secondary | ICD-10-CM | POA: Diagnosis present

## 2013-12-31 DIAGNOSIS — J42 Unspecified chronic bronchitis: Secondary | ICD-10-CM | POA: Diagnosis present

## 2013-12-31 DIAGNOSIS — I4729 Other ventricular tachycardia: Secondary | ICD-10-CM | POA: Diagnosis present

## 2013-12-31 DIAGNOSIS — Z8674 Personal history of sudden cardiac arrest: Secondary | ICD-10-CM

## 2013-12-31 DIAGNOSIS — R7303 Prediabetes: Secondary | ICD-10-CM | POA: Diagnosis present

## 2013-12-31 DIAGNOSIS — Z886 Allergy status to analgesic agent status: Secondary | ICD-10-CM

## 2013-12-31 DIAGNOSIS — I469 Cardiac arrest, cause unspecified: Secondary | ICD-10-CM

## 2013-12-31 DIAGNOSIS — J449 Chronic obstructive pulmonary disease, unspecified: Secondary | ICD-10-CM | POA: Diagnosis present

## 2013-12-31 DIAGNOSIS — I252 Old myocardial infarction: Secondary | ICD-10-CM

## 2013-12-31 DIAGNOSIS — R0600 Dyspnea, unspecified: Secondary | ICD-10-CM | POA: Diagnosis present

## 2013-12-31 DIAGNOSIS — J9600 Acute respiratory failure, unspecified whether with hypoxia or hypercapnia: Secondary | ICD-10-CM | POA: Diagnosis present

## 2013-12-31 DIAGNOSIS — I498 Other specified cardiac arrhythmias: Secondary | ICD-10-CM | POA: Diagnosis present

## 2013-12-31 LAB — CBC WITH DIFFERENTIAL/PLATELET
BASOS ABS: 0 10*3/uL (ref 0.0–0.1)
Basophils Relative: 0 % (ref 0–1)
Eosinophils Absolute: 0.6 10*3/uL (ref 0.0–0.7)
Eosinophils Relative: 4 % (ref 0–5)
HCT: 45.9 % (ref 39.0–52.0)
Hemoglobin: 15.9 g/dL (ref 13.0–17.0)
Lymphocytes Relative: 23 % (ref 12–46)
Lymphs Abs: 3.3 10*3/uL (ref 0.7–4.0)
MCH: 30.9 pg (ref 26.0–34.0)
MCHC: 34.6 g/dL (ref 30.0–36.0)
MCV: 89.3 fL (ref 78.0–100.0)
MONO ABS: 2 10*3/uL — AB (ref 0.1–1.0)
MONOS PCT: 14 % — AB (ref 3–12)
Neutro Abs: 8.6 10*3/uL — ABNORMAL HIGH (ref 1.7–7.7)
Neutrophils Relative %: 59 % (ref 43–77)
PLATELETS: 263 10*3/uL (ref 150–400)
RBC: 5.14 MIL/uL (ref 4.22–5.81)
RDW: 13 % (ref 11.5–15.5)
WBC: 14.5 10*3/uL — AB (ref 4.0–10.5)

## 2013-12-31 LAB — BASIC METABOLIC PANEL
ANION GAP: 14 (ref 5–15)
BUN: 21 mg/dL (ref 6–23)
CALCIUM: 9.7 mg/dL (ref 8.4–10.5)
CO2: 23 mEq/L (ref 19–32)
CREATININE: 0.98 mg/dL (ref 0.50–1.35)
Chloride: 101 mEq/L (ref 96–112)
GFR calc non Af Amer: >90 mL/min (ref >90)
Glucose, Bld: 107 mg/dL — ABNORMAL HIGH (ref 70–99)
Potassium: 4.6 mEq/L (ref 3.7–5.3)
Sodium: 138 mEq/L (ref 137–147)

## 2013-12-31 LAB — I-STAT TROPONIN, ED: TROPONIN I, POC: 0 ng/mL (ref 0.00–0.08)

## 2013-12-31 MED ORDER — IPRATROPIUM-ALBUTEROL 0.5-2.5 (3) MG/3ML IN SOLN
3.0000 mL | Freq: Once | RESPIRATORY_TRACT | Status: AC
  Administered 2013-12-31: 3 mL via RESPIRATORY_TRACT
  Filled 2013-12-31: qty 3

## 2013-12-31 MED ORDER — ALBUTEROL SULFATE (2.5 MG/3ML) 0.083% IN NEBU
5.0000 mg | INHALATION_SOLUTION | Freq: Once | RESPIRATORY_TRACT | Status: AC
  Administered 2013-12-31: 5 mg via RESPIRATORY_TRACT
  Filled 2013-12-31: qty 6

## 2013-12-31 MED ORDER — IPRATROPIUM BROMIDE 0.02 % IN SOLN
0.5000 mg | Freq: Once | RESPIRATORY_TRACT | Status: AC
  Administered 2013-12-31: 0.5 mg via RESPIRATORY_TRACT
  Filled 2013-12-31: qty 2.5

## 2013-12-31 MED ORDER — METHYLPREDNISOLONE SODIUM SUCC 125 MG IJ SOLR
125.0000 mg | Freq: Once | INTRAMUSCULAR | Status: AC
  Administered 2013-12-31: 125 mg via INTRAVENOUS
  Filled 2013-12-31: qty 2

## 2013-12-31 NOTE — ED Notes (Signed)
Pt reports pain only present when he coughs, otherwise is pain free.  States breathing better.  Lungs sound not as tight as pre-neb tx.  Still has insp/exp wheezing bilat, rhonchi heard throughout.

## 2013-12-31 NOTE — ED Notes (Signed)
Patient states that he has been having some shortness of breath this week, increasing today in severity.  Patient is CAOx3, with obvious shortness of breath and using some accessory muscles.

## 2013-12-31 NOTE — ED Provider Notes (Signed)
CSN: [id number]     Arrival date & time 12/31/13  2215 History   First MD Initiated Contact with Patient 12/31/13 2300     Chief Complaint  Patient presents with  . Shortness of Breath  . Chest Pain    (Consider location/radiation/quality/duration/timing/severity/associated sxs/prior Treatment) HPI Comments: Patient is a 58 year old male with a history of CAD, Vfib cardiac arrest s/p PCI with stent placement in 06/2013, COPD, and HLD. He presents to the ED today for worsening SOB. Patient states he has been experiencing shortness of breath intermittently over the past week. He was able to self manage his symptoms at home with albuterol nebulizer treatments until this morning. Patient states his shortness of breath has worsened over the course of the day. He has used 2 nebulizer treatments without relief, the last of which was at 2000. He states he has been out of his Symbicort for 1 month secondary to cost. Patient endorses an associated overall chest tightness. He states he has had chest pain with coughing since his cardiac arrest, but denies any new or worsening chest pain. Significant other states that, prior to arriving in the emergency department, patient was diaphoretic for a period. Patient states that these symptoms are consistent with when he was evaluated for COPD exacerbation 4 months ago. Patient denies sick contacts as well as fever, jaw pain or neck pain, nausea, vomiting, abdominal pain, hemoptysis, extremity numbness/tingling, extremity weakness, and syncope.  PCP - Zacarias Pontes [facility] Cardiologist - Dr. Claiborne Billings Patient has a hx of tobacco use; quit smoking 5 years ago. Last echo 11/2013 which was normal with LVEF of 55-60%  Patient is a 58 y.o. male presenting with shortness of breath and chest pain. The history is provided by the patient. No language interpreter was used.  Shortness of Breath Associated symptoms: chest pain, cough (chronic) and wheezing   Associated symptoms: no fever and  no vomiting   Chest Pain Associated symptoms: cough (chronic) and shortness of breath   Associated symptoms: no fever, no nausea, no numbness, no palpitations, not vomiting and no weakness     Past Medical History  Diagnosis Date  . Hypokalemia, replaced 06/19/2013  . Hypomagnesemia, replaced 06/19/2013  . Cardiac arrest, hypothermic protocol 06/12/2013  . NSTEMI (non-ST elevated myocardial infarction), DES to RCA 06/13/2013  . COPD exacerbation 06/12/2013  . Hyperlipidemia LDL goal < 70 06/19/2013  . Hypotension, requiring levophed 06/19/2013  . Bronchitis, chronic 06/19/2013  . Tobacco abuse 06/19/2013  . Altered mental status, improved at discharge 06/19/2013   Past Surgical History  Procedure Laterality Date  . No past surgeries    . Cardiac sstent    . Coronary angioplasty with stent placement  06/12/13    Xience Alpine DES to RCA, NSTEMI   No family history on file. History  Substance Use Topics  . Smoking status: Former Smoker -- 2.00 packs/day for 38 years    Types: Cigarettes    Quit date: 07/10/2008  . Smokeless tobacco: Never Used  . Alcohol Use: No    Review of Systems  Constitutional: Negative for fever.  Respiratory: Positive for cough (chronic), chest tightness, shortness of breath and wheezing.   Cardiovascular: Positive for chest pain. Negative for palpitations.  Gastrointestinal: Negative for nausea and vomiting.  Neurological: Negative for syncope, weakness and numbness.  All other systems reviewed and are negative.    Allergies  Aspirin and Ibuprofen  Home Medications   Prior to Admission medications   Medication Sig Start Date End Date  Taking? Authorizing Provider  acetaminophen (TYLENOL) 325 MG tablet Take 2 tablets (650 mg total) by mouth every 4 (four) hours as needed for headache or mild pain. 06/19/13  Yes Cecilie Kicks, NP  albuterol (VENTOLIN HFA) 108 (90 BASE) MCG/ACT inhaler Inhale 2 puffs into the lungs every 6 (six) hours as needed for wheezing or  shortness of breath.   Yes Historical Provider, MD  aspirin EC 81 MG tablet Take 81 mg by mouth daily.   Yes Historical Provider, MD  bisoprolol (ZEBETA) 5 MG tablet Take 5 mg by mouth at bedtime.   Yes Historical Provider, MD  budesonide-formoterol (SYMBICORT) 160-4.5 MCG/ACT inhaler Take 2 puffs first thing in am and then another 2 puffs about 12 hours later. 12/29/13  Yes Lorayne Marek, MD  losartan (COZAAR) 25 MG tablet Take 1 tablet (25 mg total) by mouth daily. 09/04/13  Yes Troy Sine, MD  rosuvastatin (CRESTOR) 40 MG tablet Take 1 tablet (40 mg total) by mouth daily. 12/29/13  Yes Lorayne Marek, MD  ticagrelor (BRILINTA) 90 MG TABS tablet Take 1 tablet (90 mg total) by mouth 2 (two) times daily. 12/29/13  Yes Lorayne Marek, MD  nitroGLYCERIN (NITROSTAT) 0.4 MG SL tablet Place 1 tablet (0.4 mg total) under the tongue every 5 (five) minutes as needed for chest pain. 06/19/13   Cecilie Kicks, NP   BP 132/75  Pulse 118  Temp(Src) 97.9 F (36.6 C) (Oral)  Resp 10  SpO2 88%  Physical Exam  Nursing note and vitals reviewed. Constitutional: He is oriented to person, place, and time. He appears well-developed and well-nourished. No distress.  Nonseptic appearing  HENT:  Head: Normocephalic and atraumatic.  Eyes: Conjunctivae and EOM are normal. No scleral icterus.  Neck: Normal range of motion. Neck supple.  No JVD  Cardiovascular: Normal rate, regular rhythm and normal heart sounds.   HR 95-105 while in exam room.  Pulmonary/Chest: Effort normal. No respiratory distress. He has wheezes.  Mild tachypnea and use of accessory muscles. Diffuse inspiratory and expiratory wheezing appreciated, worse in b/l bases. No rales appreciated on exam. Chest expansion symmetric.  Musculoskeletal: Normal range of motion. He exhibits no edema.  No pitting edema in b/l lower extremities.  Neurological: He is alert and oriented to person, place, and time. He exhibits normal muscle tone. Coordination normal.   GCS 15. Speech is goal oriented; speaking in full sentences. Patient moves extremities without ataxia.  Skin: Skin is warm and dry. No rash noted. He is not diaphoretic. No erythema. No pallor.  Psychiatric: He has a normal mood and affect. His behavior is normal.    ED Course  Procedures (including critical care time) Labs Review Labs Reviewed  CBC WITH DIFFERENTIAL - Abnormal; Notable for the following:    WBC 14.5 (*)    Monocytes Relative 14 (*)    Neutro Abs 8.6 (*)    Monocytes Absolute 2.0 (*)    All other components within normal limits  BASIC METABOLIC PANEL - Abnormal; Notable for the following:    Glucose, Bld 107 (*)    All other components within normal limits  I-STAT TROPOININ, ED    Imaging Review Dg Chest 2 View  12/31/2013   CLINICAL DATA:  sob  EXAM: CHEST - 2 VIEW  COMPARISON:  08/30/2013  FINDINGS: Mild hyperinflation. Attenuated peripheral bronchovascular markings, and coarse perihilar interstitial markings as before. No confluent airspace infiltrate or overt edema. Heart size normal. No effusion. Visualized skeletal structures are unremarkable.  IMPRESSION: 1.  Chronic interstitial changes without acute or superimposed abnormality.   Electronically Signed   By: Arne Cleveland M.D.   On: 12/31/2013 23:39     EKG Interpretation   Date/Time:  Sunday December 31 2013 22:21:50 EDT Ventricular Rate:  103 PR Interval:  146 QRS Duration: 90 QT Interval:  335 QTC Calculation: 438 R Axis:   80 Text Interpretation:  Sinus tachycardia ECG OTHERWISE WITHIN NORMAL LIMITS  Since last tracing rate faster Confirmed by MILLER  MD, BRIAN ([id number]) on  12/31/2013 11:44:28 PM      CRITICAL CARE Performed by: Antonietta Breach   Total critical care time: 35  Critical care time was exclusive of separately billable procedures and treating other patients.  Critical care was necessary to treat or prevent imminent or life-threatening deterioration.  Critical care was time spent  personally by me on the following activities: development of treatment plan with patient and/or surrogate as well as nursing, discussions with consultants, evaluation of patient's response to treatment, examination of patient, obtaining history from patient or surrogate, ordering and performing treatments and interventions, ordering and review of laboratory studies, ordering and review of radiographic studies, pulse oximetry and re-evaluation of patient's condition.  MDM   Final diagnoses:  COPD exacerbation    58 year old male presents to the emergency department for worsening shortness of breath. Symptoms consistent with past COPD exacerbation. Patient noted to have inspiratory and expiratory wheezing on arrival with mild accessory muscle use and tachypnea. Cardiac workup today has been unremarkable. Symptoms also less consistent with cardiac etiology.   Patient has had improvement in lung sounds with DuoNeb x 2 and continuous albuterol as well as IV steroids; however, does not use home O2 at baseline and has been unable to reach sats above 88% on RA. With ambulation, sats drop to 84%. Patient to be admitted to [facility] for further management of COPD exacerbation. Case discussed with Dr. Sherral Hammers who will admit.   Filed Vitals:   01/01/14 0031 01/01/14 0040 01/01/14 0221 01/01/14 0225  BP: 122/76 115/69 110/68 132/75  Pulse: 105 103 120 118  Temp:      TempSrc:      Resp: 12 9 13 10   SpO2: 93% 97% 88% 88%       Antonietta Breach, PA-C 01/01/14 0304

## 2013-12-31 NOTE — ED Notes (Signed)
Pt reports he is beginning to feel as if he is breathing better s/p second HHN tx.

## 2014-01-01 DIAGNOSIS — I472 Ventricular tachycardia: Secondary | ICD-10-CM | POA: Diagnosis present

## 2014-01-01 DIAGNOSIS — F17200 Nicotine dependence, unspecified, uncomplicated: Secondary | ICD-10-CM

## 2014-01-01 DIAGNOSIS — Z886 Allergy status to analgesic agent status: Secondary | ICD-10-CM | POA: Diagnosis not present

## 2014-01-01 DIAGNOSIS — I214 Non-ST elevation (NSTEMI) myocardial infarction: Secondary | ICD-10-CM

## 2014-01-01 DIAGNOSIS — J449 Chronic obstructive pulmonary disease, unspecified: Secondary | ICD-10-CM

## 2014-01-01 DIAGNOSIS — J42 Unspecified chronic bronchitis: Secondary | ICD-10-CM

## 2014-01-01 DIAGNOSIS — Z79899 Other long term (current) drug therapy: Secondary | ICD-10-CM | POA: Diagnosis not present

## 2014-01-01 DIAGNOSIS — I10 Essential (primary) hypertension: Secondary | ICD-10-CM

## 2014-01-01 DIAGNOSIS — I2510 Atherosclerotic heart disease of native coronary artery without angina pectoris: Secondary | ICD-10-CM | POA: Diagnosis present

## 2014-01-01 DIAGNOSIS — I4729 Other ventricular tachycardia: Secondary | ICD-10-CM | POA: Diagnosis present

## 2014-01-01 DIAGNOSIS — R7309 Other abnormal glucose: Secondary | ICD-10-CM

## 2014-01-01 DIAGNOSIS — I252 Old myocardial infarction: Secondary | ICD-10-CM | POA: Diagnosis not present

## 2014-01-01 DIAGNOSIS — Z9861 Coronary angioplasty status: Secondary | ICD-10-CM | POA: Diagnosis not present

## 2014-01-01 DIAGNOSIS — J9600 Acute respiratory failure, unspecified whether with hypoxia or hypercapnia: Secondary | ICD-10-CM | POA: Diagnosis present

## 2014-01-01 DIAGNOSIS — Z87891 Personal history of nicotine dependence: Secondary | ICD-10-CM | POA: Diagnosis not present

## 2014-01-01 DIAGNOSIS — I498 Other specified cardiac arrhythmias: Secondary | ICD-10-CM | POA: Diagnosis present

## 2014-01-01 DIAGNOSIS — J441 Chronic obstructive pulmonary disease with (acute) exacerbation: Principal | ICD-10-CM | POA: Diagnosis present

## 2014-01-01 DIAGNOSIS — J4489 Other specified chronic obstructive pulmonary disease: Secondary | ICD-10-CM

## 2014-01-01 DIAGNOSIS — Z8674 Personal history of sudden cardiac arrest: Secondary | ICD-10-CM | POA: Diagnosis not present

## 2014-01-01 DIAGNOSIS — R0602 Shortness of breath: Secondary | ICD-10-CM

## 2014-01-01 DIAGNOSIS — E785 Hyperlipidemia, unspecified: Secondary | ICD-10-CM | POA: Diagnosis present

## 2014-01-01 LAB — HEMOGLOBIN A1C
Hgb A1c MFr Bld: 6.2 % — ABNORMAL HIGH (ref <5.7)
Mean Plasma Glucose: 131 mg/dL — ABNORMAL HIGH (ref <117)

## 2014-01-01 LAB — CBC WITH DIFFERENTIAL/PLATELET
Basophils Absolute: 0 10*3/uL (ref 0.0–0.1)
Basophils Relative: 0 % (ref 0–1)
Eosinophils Absolute: 0 10*3/uL (ref 0.0–0.7)
Eosinophils Relative: 0 % (ref 0–5)
HCT: 45 % (ref 39.0–52.0)
Hemoglobin: 15.1 g/dL (ref 13.0–17.0)
LYMPHS ABS: 0.8 10*3/uL (ref 0.7–4.0)
LYMPHS PCT: 9 % — AB (ref 12–46)
MCH: 31.1 pg (ref 26.0–34.0)
MCHC: 33.6 g/dL (ref 30.0–36.0)
MCV: 92.6 fL (ref 78.0–100.0)
Monocytes Absolute: 0.1 10*3/uL (ref 0.1–1.0)
Monocytes Relative: 1 % — ABNORMAL LOW (ref 3–12)
NEUTROS PCT: 90 % — AB (ref 43–77)
Neutro Abs: 8.3 10*3/uL — ABNORMAL HIGH (ref 1.7–7.7)
Platelets: 289 10*3/uL (ref 150–400)
RBC: 4.86 MIL/uL (ref 4.22–5.81)
RDW: 13.2 % (ref 11.5–15.5)
WBC: 9.3 10*3/uL (ref 4.0–10.5)

## 2014-01-01 LAB — COMPREHENSIVE METABOLIC PANEL
ALBUMIN: 3.9 g/dL (ref 3.5–5.2)
ALT: 23 U/L (ref 0–53)
AST: 15 U/L (ref 0–37)
Alkaline Phosphatase: 70 U/L (ref 39–117)
Anion gap: 23 — ABNORMAL HIGH (ref 5–15)
BUN: 17 mg/dL (ref 6–23)
CALCIUM: 9.3 mg/dL (ref 8.4–10.5)
CO2: 16 mEq/L — ABNORMAL LOW (ref 19–32)
CREATININE: 0.8 mg/dL (ref 0.50–1.35)
Chloride: 103 mEq/L (ref 96–112)
GFR calc Af Amer: >90 mL/min (ref >90)
Glucose, Bld: 208 mg/dL — ABNORMAL HIGH (ref 70–99)
Potassium: 3.7 mEq/L (ref 3.7–5.3)
Sodium: 142 mEq/L (ref 137–147)
Total Bilirubin: 0.4 mg/dL (ref 0.3–1.2)
Total Protein: 7.6 g/dL (ref 6.0–8.3)

## 2014-01-01 LAB — LIPID PANEL
Cholesterol: 107 mg/dL (ref 0–200)
HDL: 47 mg/dL (ref >39)
LDL CALC: 50 mg/dL (ref 0–99)
TRIGLYCERIDES: 51 mg/dL (ref <150)
Total CHOL/HDL Ratio: 2.3 RATIO
VLDL: 10 mg/dL (ref 0–40)

## 2014-01-01 LAB — MAGNESIUM: Magnesium: 2.3 mg/dL (ref 1.5–2.5)

## 2014-01-01 LAB — STREP PNEUMONIAE URINARY ANTIGEN: STREP PNEUMO URINARY ANTIGEN: NEGATIVE

## 2014-01-01 LAB — PRO B NATRIURETIC PEPTIDE: Pro B Natriuretic peptide (BNP): 41.8 pg/mL (ref 0–125)

## 2014-01-01 LAB — TROPONIN I: Troponin I: <0.3 ng/mL (ref <0.30)

## 2014-01-01 MED ORDER — LEVOFLOXACIN IN D5W 750 MG/150ML IV SOLN
750.0000 mg | INTRAVENOUS | Status: DC
  Administered 2014-01-01: 750 mg via INTRAVENOUS
  Filled 2014-01-01 (×2): qty 150

## 2014-01-01 MED ORDER — ATORVASTATIN CALCIUM 80 MG PO TABS
80.0000 mg | ORAL_TABLET | Freq: Every day | ORAL | Status: DC
  Administered 2014-01-01 – 2014-01-03 (×3): 80 mg via ORAL
  Filled 2014-01-01 (×3): qty 1

## 2014-01-01 MED ORDER — BUDESONIDE-FORMOTEROL FUMARATE 160-4.5 MCG/ACT IN AERO
2.0000 | INHALATION_SPRAY | Freq: Two times a day (BID) | RESPIRATORY_TRACT | Status: DC
  Administered 2014-01-01 – 2014-01-03 (×4): 2 via RESPIRATORY_TRACT
  Filled 2014-01-01 (×2): qty 6

## 2014-01-01 MED ORDER — PREDNISONE 50 MG PO TABS
60.0000 mg | ORAL_TABLET | Freq: Two times a day (BID) | ORAL | Status: DC
  Administered 2014-01-01 – 2014-01-03 (×5): 60 mg via ORAL
  Filled 2014-01-01 (×6): qty 1

## 2014-01-01 MED ORDER — BISOPROLOL FUMARATE 5 MG PO TABS
5.0000 mg | ORAL_TABLET | Freq: Every day | ORAL | Status: DC
  Administered 2014-01-01 – 2014-01-02 (×2): 5 mg via ORAL
  Filled 2014-01-01 (×3): qty 1

## 2014-01-01 MED ORDER — NITROGLYCERIN 0.4 MG SL SUBL: 0.4000 mg | SUBLINGUAL_TABLET | SUBLINGUAL | Status: DC | PRN

## 2014-01-01 MED ORDER — ACETAMINOPHEN 650 MG RE SUPP: 650.0000 mg | Freq: Four times a day (QID) | RECTAL | Status: DC | PRN

## 2014-01-01 MED ORDER — SODIUM CHLORIDE 0.9 % IV SOLN
Freq: Once | INTRAVENOUS | Status: AC
Start: 2014-01-01 — End: 2014-01-01
  Administered 2014-01-01: 1000 mL via INTRAVENOUS

## 2014-01-01 MED ORDER — MAGNESIUM SULFATE 40 MG/ML IJ SOLN
2.0000 g | INTRAMUSCULAR | Status: AC
  Administered 2014-01-01: 2 g via INTRAVENOUS
  Filled 2014-01-01: qty 50

## 2014-01-01 MED ORDER — LOSARTAN POTASSIUM 25 MG PO TABS
25.0000 mg | ORAL_TABLET | Freq: Every day | ORAL | Status: DC
Start: 2014-01-01 — End: 2014-01-03
  Administered 2014-01-01 – 2014-01-03 (×3): 25 mg via ORAL
  Filled 2014-01-01 (×3): qty 1

## 2014-01-01 MED ORDER — ENOXAPARIN SODIUM 40 MG/0.4ML ~~LOC~~ SOLN
40.0000 mg | SUBCUTANEOUS | Status: DC
  Administered 2014-01-01 – 2014-01-02 (×2): 40 mg via SUBCUTANEOUS
  Filled 2014-01-01 (×3): qty 0.4

## 2014-01-01 MED ORDER — TICAGRELOR 90 MG PO TABS
90.0000 mg | ORAL_TABLET | Freq: Two times a day (BID) | ORAL | Status: DC
  Administered 2014-01-01 – 2014-01-03 (×5): 90 mg via ORAL
  Filled 2014-01-01 (×6): qty 1

## 2014-01-01 MED ORDER — ALBUTEROL SULFATE (2.5 MG/3ML) 0.083% IN NEBU
5.0000 mg | INHALATION_SOLUTION | Freq: Once | RESPIRATORY_TRACT | Status: DC
  Filled 2014-01-01: qty 6

## 2014-01-01 MED ORDER — ACETAMINOPHEN 325 MG PO TABS: 650.0000 mg | ORAL_TABLET | Freq: Four times a day (QID) | ORAL | Status: DC | PRN

## 2014-01-01 MED ORDER — LEVOFLOXACIN 500 MG PO TABS
500.0000 mg | ORAL_TABLET | Freq: Every day | ORAL | Status: DC
Start: 2014-01-02 — End: 2014-01-03
  Administered 2014-01-02 – 2014-01-03 (×2): 500 mg via ORAL
  Filled 2014-01-01 (×2): qty 1

## 2014-01-01 MED ORDER — SODIUM CHLORIDE 0.9 % IV SOLN: INTRAVENOUS | Status: DC

## 2014-01-01 MED ORDER — ALUM & MAG HYDROXIDE-SIMETH 200-200-20 MG/5ML PO SUSP
30.0000 mL | Freq: Four times a day (QID) | ORAL | Status: DC | PRN
  Administered 2014-01-01 (×2): 30 mL via ORAL
  Filled 2014-01-01 (×2): qty 30

## 2014-01-01 MED ORDER — METHYLPREDNISOLONE SODIUM SUCC 125 MG IJ SOLR
80.0000 mg | Freq: Every day | INTRAMUSCULAR | Status: DC
  Administered 2014-01-01: 10:00:00 via INTRAVENOUS
  Filled 2014-01-01: qty 1.28

## 2014-01-01 MED ORDER — PANTOPRAZOLE SODIUM 40 MG PO TBEC
40.0000 mg | DELAYED_RELEASE_TABLET | Freq: Every day | ORAL | Status: DC
  Administered 2014-01-02 – 2014-01-03 (×2): 40 mg via ORAL

## 2014-01-01 MED ORDER — ALBUTEROL (5 MG/ML) CONTINUOUS INHALATION SOLN
10.0000 mg | INHALATION_SOLUTION | RESPIRATORY_TRACT | Status: AC
  Administered 2014-01-01: 10 mg via RESPIRATORY_TRACT
  Filled 2014-01-01: qty 20

## 2014-01-01 MED ORDER — IPRATROPIUM-ALBUTEROL 0.5-2.5 (3) MG/3ML IN SOLN
3.0000 mL | Freq: Four times a day (QID) | RESPIRATORY_TRACT | Status: DC
  Administered 2014-01-01 – 2014-01-03 (×10): 3 mL via RESPIRATORY_TRACT
  Filled 2014-01-01 (×10): qty 3

## 2014-01-01 NOTE — H&P (Signed)
[facility] History and Physical  Timothy [name] IRC:[id number] DOB: Apr 30, 1956 DOA: 12/31/2013  Referring physician:   PCP: Lorayne Marek, MD  Specialists:   Chief Complaint: SOB  HPI: Timothy [name] is a 58 y.o. WM PMHx  CAD, Vfib cardiac arrest s/p PCI with stent placement in 06/2013, COPD, and HLD. He presents to the ED today for worsening SOB. Patient states he has been experiencing shortness of breath intermittently over the past week. He was able to self manage his symptoms at home with albuterol nebulizer treatments until this morning. Patient states his shortness of breath has worsened over the course of the day. He has used 2 nebulizer treatments without relief, the last of which was at 2000. He states he has been out of his Symbicort for 1 month secondary to cost. Patient endorses an associated overall chest tightness. He states he has had chest pain with coughing since his cardiac arrest, but denies any new or worsening chest pain. Significant other states that, prior to arriving in the emergency department, patient was diaphoretic for a period. Patient states that these symptoms are consistent with when he was evaluated for COPD exacerbation 4 months ago. Patient denies sick contacts as well as fever, jaw pain or neck pain, nausea, vomiting, abdominal pain, hemoptysis, extremity numbness/tingling, extremity weakness, and syncope.    Review of Systems: The patient denies anorexia, fever, weight loss,, vision loss, decreased hearing, hoarseness, chest pain, syncope, dyspnea on exertion, peripheral edema, balance deficits, hemoptysis, abdominal pain, melena, hematochezia, severe indigestion/heartburn, hematuria, incontinence, genital sores, muscle weakness, suspicious skin lesions, transient blindness, difficulty walking, depression, unusual weight change, abnormal bleeding, enlarged lymph nodes, angioedema, and breast masses.    TRAVEL HISTORY: NA   Consultants:     Procedure/Significant Events:  NA    Culture  8/31 respiratory viral panel   Antibiotics:  Levofloxacin 8/31>>   DVT prophylaxis:  Lovenox  Devices   NA  LINES / TUBES:  8/30 20ga right forearm   Past Medical History  Diagnosis Date  . Hypokalemia, replaced 06/19/2013  . Hypomagnesemia, replaced 06/19/2013  . Cardiac arrest, hypothermic protocol 06/12/2013  . NSTEMI (non-ST elevated myocardial infarction), DES to RCA 06/13/2013  . COPD exacerbation 06/12/2013  . Hyperlipidemia LDL goal < 70 06/19/2013  . Hypotension, requiring levophed 06/19/2013  . Bronchitis, chronic 06/19/2013  . Tobacco abuse 06/19/2013  . Altered mental status, improved at discharge 06/19/2013   Past Surgical History  Procedure Laterality Date  . No past surgeries    . Cardiac sstent    . Coronary angioplasty with stent placement  06/12/13    Xience Alpine DES to RCA, NSTEMI   Social History:  reports that he quit smoking about 5 years ago. His smoking use included Cigarettes. He has a 76 pack-year smoking history. He has never used smokeless tobacco. He reports that he does not drink alcohol or use illicit drugs.     Allergies  Allergen Reactions  . Aspirin Swelling    To lips.  . Ibuprofen Swelling    To lips.    No family history on file.   Prior to Admission medications   Medication Sig Start Date End Date Taking? Authorizing Provider  acetaminophen (TYLENOL) 325 MG tablet Take 2 tablets (650 mg total) by mouth every 4 (four) hours as needed for headache or mild pain. 06/19/13  Yes Cecilie Kicks, NP  albuterol (VENTOLIN HFA) 108 (90 BASE) MCG/ACT inhaler Inhale 2 puffs into the lungs every 6 (six) hours as  needed for wheezing or shortness of breath.   Yes Historical Provider, MD  aspirin EC 81 MG tablet Take 81 mg by mouth daily.   Yes Historical Provider, MD  bisoprolol (ZEBETA) 5 MG tablet Take 5 mg by mouth at bedtime.   Yes Historical Provider, MD  budesonide-formoterol (SYMBICORT)  160-4.5 MCG/ACT inhaler Take 2 puffs first thing in am and then another 2 puffs about 12 hours later. 12/29/13  Yes Lorayne Marek, MD  losartan (COZAAR) 25 MG tablet Take 1 tablet (25 mg total) by mouth daily. 09/04/13  Yes Troy Sine, MD  rosuvastatin (CRESTOR) 40 MG tablet Take 1 tablet (40 mg total) by mouth daily. 12/29/13  Yes Lorayne Marek, MD  ticagrelor (BRILINTA) 90 MG TABS tablet Take 1 tablet (90 mg total) by mouth 2 (two) times daily. 12/29/13  Yes Lorayne Marek, MD  nitroGLYCERIN (NITROSTAT) 0.4 MG SL tablet Place 1 tablet (0.4 mg total) under the tongue every 5 (five) minutes as needed for chest pain. 06/19/13   Cecilie Kicks, NP   Physical Exam: Filed Vitals:   01/01/14 0031 01/01/14 0040 01/01/14 0221 01/01/14 0225  BP: 122/76 115/69 110/68 132/75  Pulse: 105 103 120 118  Temp:      TempSrc:      Resp: 12 9 13 10   SpO2: 93% 97% 88% 88%     General:  A./O. x4, NAD, mild respiratory distress.  Eyes: Pupils equal round reactive to light and accommodation  Neck: Negative JVD, negative lymphadenopathy  Cardiovascular: Tachycardic, regular rhythm, negative murmurs rubs gallops, normal S1/S2  Respiratory: Poor air movement throughout, rhonchi, diffuse expiratory wheeze   Abdomen: Soft, nontender, nondistended, plus bowel  Musculoskeletal: Negative pedal  Neurologic: Cranial nerves II-XII intact, extremity strength 5/5, sensation intact throughout  Labs on Admission:  Basic Metabolic Panel:  Recent Labs Lab 12/31/13 2243  NA 138  K 4.6  CL 101  CO2 23  GLUCOSE 107*  BUN 21  CREATININE 0.98  CALCIUM 9.7   Liver Function Tests: No results found for this basename: AST, ALT, ALKPHOS, BILITOT, PROT, ALBUMIN,  in the last 168 hours No results found for this basename: LIPASE, AMYLASE,  in the last 168 hours No results found for this basename: AMMONIA,  in the last 168 hours CBC:  Recent Labs Lab 12/31/13 2243  WBC 14.5*  NEUTROABS 8.6*  HGB 15.9  HCT 45.9   MCV 89.3  PLT 263   Cardiac Enzymes: No results found for this basename: CKTOTAL, CKMB, CKMBINDEX, TROPONINI,  in the last 168 hours  BNP (last 3 results) No results found for this basename: PROBNP,  in the last 8760 hours CBG: No results found for this basename: GLUCAP,  in the last 168 hours  Radiological Exams on Admission: Dg Chest 2 View  12/31/2013   CLINICAL DATA:  sob  EXAM: CHEST - 2 VIEW  COMPARISON:  08/30/2013  FINDINGS: Mild hyperinflation. Attenuated peripheral bronchovascular markings, and coarse perihilar interstitial markings as before. No confluent airspace infiltrate or overt edema. Heart size normal. No effusion. Visualized skeletal structures are unremarkable.  IMPRESSION: 1. Chronic interstitial changes without acute or superimposed abnormality.   Electronically Signed   By: Arne Cleveland M.D.   On: 12/31/2013 23:39    EKG: Sinus tachycardia   Assessment/Plan Active Problems:   VT (ventricular tachycardia), requiring 2 shocks   COPD GOLD III   NSTEMI (non-ST elevated myocardial infarction), DES to RCA   CAD (coronary artery disease)   Hyperlipidemia with target  LDL less than 70   Bronchitis, chronic   Tobacco abuse   HTN (hypertension)   SOB (shortness of breath)   Prediabetes    COPD exacerbation -Solu-Medrol 80 mg daily -DuoNeb QID -Levofloxacin 750 mg daily -Legionella, strep pneumo urine antigens pending -Respiratory virus panel pending  Chronic bronchitis -See COPD exacerbation  Tobacco abuse -Stopped smoking 5 years ago  NSTEMI w/DES to RCA -First troponin negative, however will cycle given patient's extensive cardiac history -BNP pending  CAD See NSTEMI -Continue Brilinta 90 mg BID -NOTE patient allergic to aspirin so will not administer (swelling of lips)  HTN -Continue bisoprolol 5 mg daily -Continue losartan 25 mg daily   Prediabetes -Obtain hemoglobin A1c -Obtain lipid panel  HLD See prediabetes   Code Status:  Full Family Communication: None Disposition Plan: Resolution of COPD exacerbation  Time spent: 60 minutes  Illene Sweeting, Geraldo Docker [facility] Pager [phone]  If 7PM-7AM, please contact night-coverage [email or web address] Password [facility] 01/01/2014, 3:19 AM

## 2014-01-01 NOTE — ED Notes (Addendum)
PT ambulated in hallway. Steady gait. Pt reported slight dizziness when walking. Pt o2 saturation = 84% upon return to room.

## 2014-01-01 NOTE — Progress Notes (Signed)
Patient admitted after midnight. Chart reviewed. Patient examined.  Better, but still wheezing and dyspneic. Eating ok. Saline lock and change meds to PO.   Doree Barthel, MD [facility]

## 2014-01-01 NOTE — Care Management Note (Addendum)
    Page 1 of 2   01/04/2014     11:03:13 AM CARE MANAGEMENT NOTE 01/04/2014  Patient:  Timothy [name], Timothy [name]   Account Number:  [id number]  Date Initiated:  01/01/2014  Documentation initiated by:  Sebastiano Luecke  Subjective/Objective Assessment:   Pt adm on 12/31/13 with COPD exacerbation.  PTA, pt independent, has supportive girlfriend.     Action/Plan:   Will follow for dc needs as pt progresses.  Per report, pt may need med assistance.   He is uninsured.   Anticipated DC Date:  01/03/2014   Anticipated DC Plan:  [place]  CM consult  Medication Assistance  Follow-up appt scheduled  MATCH Program      Choice offered to / List presented to:     DME arranged  [facility]      DME agency  [place].        Status of service:  Completed, signed off Medicare Important Message given?   (If response is "NO", the following Medicare IM given date fields will be blank) Date Medicare IM given:   Medicare IM given by:   Date Additional Medicare IM given:   Additional Medicare IM given by:    Discharge Disposition:  HOME/SELF CARE  Per UR Regulation:  Reviewed for med. necessity/level of care/duration of stay  If discussed at [place] of Stay Meetings, dates discussed:    Comments:  01/03/14 Ellan Lambert, RN, BSN [phone] Met with pt to discuss issues with med affordability.  Pt states he has applied for Medicaid, but it is pending.  He is a pt at [facility] and [facility], and was to meet with pharmacist at clinic on 9/1 to enroll in assistance programs on 9/1, but was in the hospital.  I spoke with Jennings Books, pharmacist at [facility], and rescheduled his pharmacy appt for Wed, Sept 9, at 11:45--appt info put on AVS in EPIC.  She will assist pt with enrolling in programs for Brilinta and Symbicort.  Pt is eligible for med assist therough [facility] letter given with explanation of program benefits.  Pt cont to desat with  ambulation; will need home oxygen.  Referral to [facility] for DME needs; portable tank to be delivered to pt prior to dc home.  Pt appreciative of all help given.

## 2014-01-01 NOTE — ED Provider Notes (Signed)
The patient is a 59 year old male with a significant history of her recent cardiac arrest status post resuscitation earlier this year as well as a history of COPD.  he presents with a complaint of shortness of breath and mild cough. On exam the patient is in significant respiratory distress, wheezing in all lung fields, no peripheral edema, JVD or murmurs. His EKG is nonischemic and his chest x-ray shows no infiltrates. The patient received a continuous nebulized therapy, he ambulated with significant hypoxia, received a second nebulized treatment and had ongoing hypoxia and will require admission to the hospital. He received intravenous steroids, continuous nebulized therapy as part of his critical care treatment.  Diagnoses  #1 respiratory distress #2 COPD exacerbation  Medical screening examination/treatment/procedure(s) were conducted as a shared visit with non-physician practitioner(s) and myself.  I personally evaluated the patient during the encounter.    Johnna Acosta, MD 01/01/14 0330

## 2014-01-02 LAB — LEGIONELLA ANTIGEN, URINE: LEGIONELLA ANTIGEN, URINE: NEGATIVE

## 2014-01-02 LAB — INFLUENZA PANEL BY PCR (TYPE A & B)
H1N1FLUPCR: NOT DETECTED
Influenza A By PCR: NEGATIVE
Influenza B By PCR: NEGATIVE

## 2014-01-02 LAB — TROPONIN I: Troponin I: <0.3 ng/mL (ref <0.30)

## 2014-01-02 NOTE — Progress Notes (Addendum)
PROGRESS NOTE  Timothy [name] VPX:[id number] DOB: 11/14/1955 DOA: 12/31/2013 PCP: Lorayne Marek, MD  Assessment/Plan: COPD exacerbation  -changed to PO  -DuoNeb QID  -Levofloxacin 750 mg daily  -Legionella, strep pneumo urine antigens negative -Respiratory virus panel negative   Acute resp failure secondary to COPD exacerbation -wean O2 as tolerated  Chronic bronchitis  -See COPD exacerbation   Tobacco abuse  -Stopped smoking 5 years ago   NSTEMI w/DES to RCA  -First troponin negative, however will cycle given patient's extensive cardiac history  -BNP low   CAD  See NSTEMI  -Continue Brilinta 90 mg BID  -NOTE patient allergic to aspirin so will not administer (swelling of lips)   HTN  -Continue bisoprolol 5 mg daily  -Continue losartan 25 mg daily   Prediabetes  - hemoglobin A1c 6.2 -lipid panel : LDL ok  HLD  See prediabetes   Code Status: full Family Communication: patient Disposition Plan: home 1-2 days   Consultants:    Procedures:    Antibiotics:    HPI/Subjective: Still with wheezing but able to ambulate with no O2 > 88%  Objective: Filed Vitals:   01/02/14 0751  BP: 125/62  Pulse: 99  Temp:   Resp:    No intake or output data in the 24 hours ending 01/02/14 0933 Filed Weights   01/01/14 0543  Weight: 59.784 kg (131 lb 12.8 oz)    Exam:   General:  A+Ox3, NAD  Cardiovascular: rrr  Respiratory: tight/wheezing  Abdomen: +BS, soft  Musculoskeletal: moves all 4 ext, no edema   Data Reviewed: Basic Metabolic Panel:  Recent Labs Lab 12/31/13 2243 01/01/14 0635  NA 138 142  K 4.6 3.7  CL 101 103  CO2 23 16*  GLUCOSE 107* 208*  BUN 21 17  CREATININE 0.98 0.80  CALCIUM 9.7 9.3  MG  --  2.3   Liver Function Tests:  Recent Labs Lab 01/01/14 0635  AST 15  ALT 23  ALKPHOS 70  BILITOT 0.4  PROT 7.6  ALBUMIN 3.9   No results found for this basename: LIPASE, AMYLASE,  in the last 168 hours No results found  for this basename: AMMONIA,  in the last 168 hours CBC:  Recent Labs Lab 12/31/13 2243 01/01/14 0635  WBC 14.5* 9.3  NEUTROABS 8.6* 8.3*  HGB 15.9 15.1  HCT 45.9 45.0  MCV 89.3 92.6  PLT 263 289   Cardiac Enzymes:  Recent Labs Lab 01/01/14 1618 01/01/14 2218 01/02/14 0252  TROPONINI <0.30 <0.30 <0.30   BNP (last 3 results)  Recent Labs  01/01/14 0635  PROBNP 41.8   CBG: No results found for this basename: GLUCAP,  in the last 168 hours  No results found for this or any previous visit (from the past 240 hour(s)).   Studies: Dg Chest 2 View  12/31/2013   CLINICAL DATA:  sob  EXAM: CHEST - 2 VIEW  COMPARISON:  08/30/2013  FINDINGS: Mild hyperinflation. Attenuated peripheral bronchovascular markings, and coarse perihilar interstitial markings as before. No confluent airspace infiltrate or overt edema. Heart size normal. No effusion. Visualized skeletal structures are unremarkable.  IMPRESSION: 1. Chronic interstitial changes without acute or superimposed abnormality.   Electronically Signed   By: Arne Cleveland M.D.   On: 12/31/2013 23:39    Scheduled Meds: . atorvastatin  80 mg Oral q1800  . bisoprolol  5 mg Oral QHS  . budesonide-formoterol  2 puff Inhalation BID  . enoxaparin (LOVENOX) injection  40 mg Subcutaneous Q24H  .  ipratropium-albuterol  3 mL Nebulization Q6H  . levofloxacin  500 mg Oral Daily  . losartan  25 mg Oral Daily  . pantoprazole  40 mg Oral Daily  . predniSONE  60 mg Oral BID WC  . ticagrelor  90 mg Oral BID   Continuous Infusions:  Antibiotics Given (last 72 hours)   None      Active Problems:   VT (ventricular tachycardia), requiring 2 shocks   COPD GOLD III   NSTEMI (non-ST elevated myocardial infarction), DES to RCA   CAD (coronary artery disease)   Hyperlipidemia with target LDL less than 70   Bronchitis, chronic   Tobacco abuse   HTN (hypertension)   SOB (shortness of breath)   Prediabetes   COPD exacerbation    Time  spent: 35 min    Marquin Patino  [facility] Pager [phone]. If 7PM-7AM, please contact night-coverage at [email or web address], password [facility] 01/02/2014, 9:33 AM  LOS: 2 days

## 2014-01-02 NOTE — Progress Notes (Signed)
Pt ambulated 350 feet independently on room air, o2 sats remained 91-92%, pt states he still feels "a little short of breath", will continue to monitor Rickard Rhymes, RN

## 2014-01-02 NOTE — Clinical Documentation Improvement (Signed)
  Patient admitted with COPD Exacerbation.  Mild accessory muscle use, tachypnea and room air sat 88% documented by ED MD.  02 Sat dropped to 84% with ambulation per ED nurses' notes.  Required 02 at 2 liters nasal cannula.  Sinus Tachycardia also noted in ED  Possible Clinical Conditions:   - Acute Respiratory Failure 2/2 .................................   - Other Condition   - Unable to Clinically Determine  Thank You, Erling Conte ,RN Clinical Documentation Specialist:  [phone] [place] [employer]

## 2014-01-03 MED ORDER — BUDESONIDE-FORMOTEROL FUMARATE 160-4.5 MCG/ACT IN AERO: 2.0000 | INHALATION_SPRAY | Freq: Two times a day (BID) | RESPIRATORY_TRACT | Status: DC

## 2014-01-03 MED ORDER — TICAGRELOR 90 MG PO TABS: 90.0000 mg | ORAL_TABLET | Freq: Two times a day (BID) | ORAL | Status: DC

## 2014-01-03 MED ORDER — IPRATROPIUM-ALBUTEROL 0.5-2.5 (3) MG/3ML IN SOLN: 3.0000 mL | Freq: Four times a day (QID) | RESPIRATORY_TRACT | Status: DC | PRN

## 2014-01-03 MED ORDER — PANTOPRAZOLE SODIUM 40 MG PO TBEC: 40.0000 mg | DELAYED_RELEASE_TABLET | Freq: Every day | ORAL | Status: DC

## 2014-01-03 MED ORDER — PREDNISONE 20 MG PO TABS: ORAL_TABLET | ORAL | Status: DC

## 2014-01-03 MED ORDER — LEVOFLOXACIN 500 MG PO TABS: 500.0000 mg | ORAL_TABLET | Freq: Every day | ORAL | Status: DC

## 2014-01-03 NOTE — Progress Notes (Signed)
Pt ambulated 550 feet on room air, sats averaged between 88-91% with a slight drop to 86% but the patient recovered quickly, Dr. Eliseo Squires notified Rickard Rhymes, RN

## 2014-01-03 NOTE — Progress Notes (Signed)
SATURATION QUALIFICATIONS: (This note is used to comply with regulatory documentation for home oxygen)  Patient Saturations on Room Air at Rest = 90%  Patient Saturations on Room Air while Ambulating = 85%  Patient Saturations on 2 Liters of oxygen while Ambulating = 95%  Rickard Rhymes, RN

## 2014-01-03 NOTE — Progress Notes (Signed)
o2 at bedside, discharge education, medication, prescriptions, and follow-up appts discussed with pt and pt stated understanding, IV and tele removed, family aware of discharge, match letter given to pt Rickard Rhymes, RN

## 2014-01-03 NOTE — Discharge Summary (Signed)
Physician Discharge Summary  Timothy [name] TGG:[id number] DOB: 1955-07-19 DOA: 12/31/2013  PCP: Lorayne Marek, MD  Admit date: 12/31/2013 Discharge date: 01/03/2014  Time spent: 35 minutes  Recommendations for Outpatient Follow-up:  1. Home O2- suspect can be weaned on quiclkly  Discharge Diagnoses:  Active Problems:   VT (ventricular tachycardia), requiring 2 shocks   COPD GOLD III   NSTEMI (non-ST elevated myocardial infarction), DES to RCA   CAD (coronary artery disease)   Hyperlipidemia with target LDL less than 70   Bronchitis, chronic   Tobacco abuse   HTN (hypertension)   SOB (shortness of breath)   Prediabetes   COPD exacerbation   Discharge Condition: improved  Diet recommendation: cardiac  Filed Weights   01/01/14 0543  Weight: 59.784 kg (131 lb 12.8 oz)    History of present illness:  Timothy [name] is a 58 y.o. WM PMHx CAD, Vfib cardiac arrest s/p PCI with stent placement in 06/2013, COPD, and HLD. He presents to the ED today for worsening SOB. Patient states he has been experiencing shortness of breath intermittently over the past week. He was able to self manage his symptoms at home with albuterol nebulizer treatments until this morning. Patient states his shortness of breath has worsened over the course of the day. He has used 2 nebulizer treatments without relief, the last of which was at 2000. He states he has been out of his Symbicort for 1 month secondary to cost. Patient endorses an associated overall chest tightness. He states he has had chest pain with coughing since his cardiac arrest, but denies any new or worsening chest pain. Significant other states that, prior to arriving in the emergency department, patient was diaphoretic for a period. Patient states that these symptoms are consistent with when he was evaluated for COPD exacerbation 4 months ago. Patient denies sick contacts as well as fever, jaw pain or neck pain, nausea, vomiting, abdominal pain,  hemoptysis, extremity numbness/tingling, extremity weakness, and syncope.   Hospital Course:  COPD exacerbation  -changed to PO steroids- slow wean -DuoNeb QID  -Levofloxacin 750 mg daily  -Legionella, strep pneumo urine antigens negative  -Respiratory virus panel negative   Acute resp failure secondary to COPD exacerbation  -wean O2 as tolerated   Chronic bronchitis  -See COPD exacerbation   Tobacco abuse  -Stopped smoking 5 years ago   NSTEMI w/DES to RCA  -First troponin negative, however will cycle given patient's extensive cardiac history  -BNP low   CAD  See NSTEMI  -Continue Brilinta 90 mg BID  -NOTE patient allergic to aspirin so will not administer (swelling of lips)   HTN  -Continue bisoprolol 5 mg daily  -Continue losartan 25 mg daily   Prediabetes  - hemoglobin A1c 6.2  -lipid panel : LDL ok   HLD  See prediabetes   Procedures:    Consultations:    Discharge Exam: Filed Vitals:   01/03/14 0531  BP: 110/65  Pulse: 91  Temp: 97.9 F (36.6 C)  Resp: 18    General: A+Ox3, NAD Cardiovascular: rrr Respiratory: clear  Discharge Instructions You were cared for by a hospitalist during your hospital stay. If you have any questions about your discharge medications or the care you received while you were in the hospital after you are discharged, you can call the unit and asked to speak with the hospitalist on call if the hospitalist that took care of you is not available. Once you are discharged, your primary care physician  will handle any further medical issues. Please note that NO REFILLS for any discharge medications will be authorized once you are discharged, as it is imperative that you return to your primary care physician (or establish a relationship with a primary care physician if you do not have one) for your aftercare needs so that they can reassess your need for medications and monitor your lab values.  Discharge Instructions   Diet - low  sodium heart healthy    Complete by:  As directed      Increase activity slowly    Complete by:  As directed             Medication List         acetaminophen 325 MG tablet  Commonly known as:  TYLENOL  Take 2 tablets (650 mg total) by mouth every 4 (four) hours as needed for headache or mild pain.     aspirin EC 81 MG tablet  Take 81 mg by mouth daily.     bisoprolol 5 MG tablet  Commonly known as:  ZEBETA  Take 5 mg by mouth at bedtime.     budesonide-formoterol 160-4.5 MCG/ACT inhaler  Commonly known as:  SYMBICORT  Take 2 puffs first thing in am and then another 2 puffs about 12 hours later.     levofloxacin 500 MG tablet  Commonly known as:  LEVAQUIN  Take 1 tablet (500 mg total) by mouth daily.     losartan 25 MG tablet  Commonly known as:  COZAAR  Take 1 tablet (25 mg total) by mouth daily.     nitroGLYCERIN 0.4 MG SL tablet  Commonly known as:  NITROSTAT  Place 1 tablet (0.4 mg total) under the tongue every 5 (five) minutes as needed for chest pain.     pantoprazole 40 MG tablet  Commonly known as:  PROTONIX  Take 1 tablet (40 mg total) by mouth daily.     predniSONE 20 MG tablet  Commonly known as:  DELTASONE  60 mg  x 2 days, 50 mg x 2 days, 40 mg x 2 days, 30 mg x 2 days, 20 mg x 2 days, 10 mg x 2 days and then d/c     rosuvastatin 40 MG tablet  Commonly known as:  CRESTOR  Take 1 tablet (40 mg total) by mouth daily.     ticagrelor 90 MG Tabs tablet  Commonly known as:  BRILINTA  Take 1 tablet (90 mg total) by mouth 2 (two) times daily.     VENTOLIN HFA 108 (90 BASE) MCG/ACT inhaler  Generic drug:  albuterol  Inhale 2 puffs into the lungs every 6 (six) hours as needed for wheezing or shortness of breath.       Allergies  Allergen Reactions  . Aspirin Swelling    To lips.  . Ibuprofen Swelling    To lips.       Follow-up Information   Follow up with Lorayne Marek, MD In 1 week.   Specialty:  Internal Medicine   Contact information:    [place] [address] [phone]        The results of significant diagnostics from this hospitalization (including imaging, microbiology, ancillary and laboratory) are listed below for reference.    Significant Diagnostic Studies: Dg Chest 2 View  12/31/2013   CLINICAL DATA:  sob  EXAM: CHEST - 2 VIEW  COMPARISON:  08/30/2013  FINDINGS: Mild hyperinflation. Attenuated peripheral bronchovascular markings, and coarse perihilar interstitial  markings as before. No confluent airspace infiltrate or overt edema. Heart size normal. No effusion. Visualized skeletal structures are unremarkable.  IMPRESSION: 1. Chronic interstitial changes without acute or superimposed abnormality.   Electronically Signed   By: Arne Cleveland M.D.   On: 12/31/2013 23:39    Microbiology: No results found for this or any previous visit (from the past 240 hour(s)).   Labs: Basic Metabolic Panel:  Recent Labs Lab 12/31/13 2243 01/01/14 0635  NA 138 142  K 4.6 3.7  CL 101 103  CO2 23 16*  GLUCOSE 107* 208*  BUN 21 17  CREATININE 0.98 0.80  CALCIUM 9.7 9.3  MG  --  2.3   Liver Function Tests:  Recent Labs Lab 01/01/14 0635  AST 15  ALT 23  ALKPHOS 70  BILITOT 0.4  PROT 7.6  ALBUMIN 3.9   No results found for this basename: LIPASE, AMYLASE,  in the last 168 hours No results found for this basename: AMMONIA,  in the last 168 hours CBC:  Recent Labs Lab 12/31/13 2243 01/01/14 0635  WBC 14.5* 9.3  NEUTROABS 8.6* 8.3*  HGB 15.9 15.1  HCT 45.9 45.0  MCV 89.3 92.6  PLT 263 289   Cardiac Enzymes:  Recent Labs Lab 01/01/14 1618 01/01/14 2218 01/02/14 0252  TROPONINI <0.30 <0.30 <0.30   BNP: BNP (last 3 results)  Recent Labs  01/01/14 0635  PROBNP 41.8   CBG: No results found for this basename: GLUCAP,  in the last 168 hours     Signed:  Eliseo Squires, JESSICA  [facility] 01/03/2014, 10:03 AM

## 2014-01-10 ENCOUNTER — Ambulatory Visit: Payer: MEDICAID | Attending: Internal Medicine

## 2014-01-15 ENCOUNTER — Encounter: Payer: Self-pay | Admitting: Internal Medicine

## 2014-01-15 ENCOUNTER — Other Ambulatory Visit: Payer: Self-pay

## 2014-01-15 ENCOUNTER — Ambulatory Visit: Payer: Medicaid Other | Attending: Internal Medicine | Admitting: Internal Medicine

## 2014-01-15 VITALS — BP 133/86 | HR 67 | Temp 98.0°F | Resp 16 | Wt 135.4 lb

## 2014-01-15 DIAGNOSIS — I252 Old myocardial infarction: Secondary | ICD-10-CM | POA: Insufficient documentation

## 2014-01-15 DIAGNOSIS — R7309 Other abnormal glucose: Secondary | ICD-10-CM | POA: Diagnosis not present

## 2014-01-15 DIAGNOSIS — I2510 Atherosclerotic heart disease of native coronary artery without angina pectoris: Secondary | ICD-10-CM | POA: Diagnosis not present

## 2014-01-15 DIAGNOSIS — Z9861 Coronary angioplasty status: Secondary | ICD-10-CM | POA: Insufficient documentation

## 2014-01-15 DIAGNOSIS — Z87891 Personal history of nicotine dependence: Secondary | ICD-10-CM | POA: Insufficient documentation

## 2014-01-15 DIAGNOSIS — J449 Chronic obstructive pulmonary disease, unspecified: Secondary | ICD-10-CM

## 2014-01-15 DIAGNOSIS — Z23 Encounter for immunization: Secondary | ICD-10-CM | POA: Insufficient documentation

## 2014-01-15 DIAGNOSIS — Z7982 Long term (current) use of aspirin: Secondary | ICD-10-CM | POA: Diagnosis not present

## 2014-01-15 DIAGNOSIS — E785 Hyperlipidemia, unspecified: Secondary | ICD-10-CM | POA: Diagnosis not present

## 2014-01-15 DIAGNOSIS — Z79899 Other long term (current) drug therapy: Secondary | ICD-10-CM | POA: Insufficient documentation

## 2014-01-15 DIAGNOSIS — J4489 Other specified chronic obstructive pulmonary disease: Secondary | ICD-10-CM | POA: Insufficient documentation

## 2014-01-15 DIAGNOSIS — I10 Essential (primary) hypertension: Secondary | ICD-10-CM | POA: Insufficient documentation

## 2014-01-15 DIAGNOSIS — R7303 Prediabetes: Secondary | ICD-10-CM

## 2014-01-15 MED ORDER — TICAGRELOR 90 MG PO TABS
90.0000 mg | ORAL_TABLET | Freq: Two times a day (BID) | ORAL | Status: DC
Start: 2014-01-15 — End: 2015-01-17

## 2014-01-15 MED ORDER — BUDESONIDE-FORMOTEROL FUMARATE 160-4.5 MCG/ACT IN AERO: 2.0000 | INHALATION_SPRAY | Freq: Two times a day (BID) | RESPIRATORY_TRACT | Status: DC

## 2014-01-15 MED ORDER — ROSUVASTATIN CALCIUM 40 MG PO TABS: 40.0000 mg | ORAL_TABLET | Freq: Every day | ORAL | Status: DC

## 2014-01-15 NOTE — Progress Notes (Signed)
Patient here for follow up from hospital  Was admitted for breathing problems Has a history of COPD

## 2014-01-15 NOTE — Progress Notes (Signed)
MRN: [id number] Name: Timothy [name]  Sex: male Age: 58 y.o. DOB: 04/21/1956  Allergies: Aspirin and Ibuprofen  Chief Complaint  Patient presents with  . Hospitalization Follow-up    HPI: Patient is 58 y.o. male who has  History of CAD, status post PCI with stent, COPD hyperlipidemia, recently hospitalized with symptoms of shortness of breath, EMR reviewed, patient was treated for COPD exacerbation, patient was given steroid, antibiotic levofloxacin and nebulization, as he is ruled out troponins negative, patient was discharged with prednisone taper, Symbicort and  antibiotic Levaquin. Patient reports improvement in the symptoms already finished a course of antibiotic and is using Symbicort regularly denies any chest pain or shortness of breath.  Past Medical History  Diagnosis Date  . Hypokalemia, replaced 06/19/2013  . Hypomagnesemia, replaced 06/19/2013  . Cardiac arrest, hypothermic protocol 06/12/2013  . NSTEMI (non-ST elevated myocardial infarction), DES to RCA 06/13/2013  . COPD exacerbation 06/12/2013  . Hyperlipidemia LDL goal < 70 06/19/2013  . Hypotension, requiring levophed 06/19/2013  . Bronchitis, chronic 06/19/2013  . Tobacco abuse 06/19/2013  . Altered mental status, improved at discharge 06/19/2013    Past Surgical History  Procedure Laterality Date  . No past surgeries    . Cardiac sstent    . Coronary angioplasty with stent placement  06/12/13    Xience [place] DES to RCA, NSTEMI      Medication List       This list is accurate as of: 01/15/14  9:50 AM.  Always use your most recent med list.               acetaminophen 325 MG tablet  Commonly known as:  TYLENOL  Take 2 tablets (650 mg total) by mouth every 4 (four) hours as needed for headache or mild pain.     aspirin EC 81 MG tablet  Take 81 mg by mouth daily.     bisoprolol 5 MG tablet  Commonly known as:  ZEBETA  Take 5 mg by mouth at bedtime.     budesonide-formoterol 160-4.5 MCG/ACT inhaler    Commonly known as:  SYMBICORT  Inhale 2 puffs into the lungs 2 (two) times daily.     levofloxacin 500 MG tablet  Commonly known as:  LEVAQUIN  Take 1 tablet (500 mg total) by mouth daily.     losartan 25 MG tablet  Commonly known as:  COZAAR  Take 1 tablet (25 mg total) by mouth daily.     nitroGLYCERIN 0.4 MG SL tablet  Commonly known as:  NITROSTAT  Place 1 tablet (0.4 mg total) under the tongue every 5 (five) minutes as needed for chest pain.     pantoprazole 40 MG tablet  Commonly known as:  PROTONIX  Take 1 tablet (40 mg total) by mouth daily.     predniSONE 20 MG tablet  Commonly known as:  DELTASONE  60 mg  x 2 days, 50 mg x 2 days, 40 mg x 2 days, 30 mg x 2 days, 20 mg x 2 days, 10 mg x 2 days and then d/c     rosuvastatin 40 MG tablet  Commonly known as:  CRESTOR  Take 1 tablet (40 mg total) by mouth daily.     ticagrelor 90 MG Tabs tablet  Commonly known as:  BRILINTA  Take 1 tablet (90 mg total) by mouth 2 (two) times daily.     VENTOLIN HFA 108 (90 BASE) MCG/ACT inhaler  Generic drug:  albuterol  Inhale  2 puffs into the lungs every 6 (six) hours as needed for wheezing or shortness of breath.        No orders of the defined types were placed in this encounter.    Immunization History  Administered Date(s) Administered  . Influenza,inj,Quad PF,36+ Mos 01/15/2014  . Pneumococcal Polysaccharide-23 06/15/2013    History reviewed. No pertinent family history.  History  Substance Use Topics  . Smoking status: Former Smoker -- 2.00 packs/day for 38 years    Types: Cigarettes    Quit date: 07/10/2008  . Smokeless tobacco: Never Used  . Alcohol Use: No    Review of Systems   As noted in HPI  Filed Vitals:   01/15/14 0918  BP: 133/86  Pulse: 67  Temp: 98 F (36.7 C)  Resp: 16    Physical Exam  Physical Exam  Constitutional: No distress.  Eyes: EOM are normal. Pupils are equal, round, and reactive to light.  Cardiovascular: Normal rate  and regular rhythm.   Pulmonary/Chest: Breath sounds normal. No respiratory distress. He has no wheezes. He has no rales.  Musculoskeletal: He exhibits no edema.    CBC    Component Value Date/Time   WBC 9.3 01/01/2014 0635   RBC 4.86 01/01/2014 0635   HGB 15.1 01/01/2014 0635   HCT 45.0 01/01/2014 0635   PLT 289 01/01/2014 0635   MCV 92.6 01/01/2014 0635   LYMPHSABS 0.8 01/01/2014 0635   MONOABS 0.1 01/01/2014 0635   EOSABS 0.0 01/01/2014 0635   BASOSABS 0.0 01/01/2014 0635    CMP     Component Value Date/Time   NA 142 01/01/2014 0635   K 3.7 01/01/2014 0635   CL 103 01/01/2014 0635   CO2 16* 01/01/2014 0635   GLUCOSE 208* 01/01/2014 0635   BUN 17 01/01/2014 0635   CREATININE 0.80 01/01/2014 0635   CREATININE 0.81 07/21/2013 0907   CALCIUM 9.3 01/01/2014 0635   PROT 7.6 01/01/2014 0635   ALBUMIN 3.9 01/01/2014 0635   AST 15 01/01/2014 0635   ALT 23 01/01/2014 0635   ALKPHOS 70 01/01/2014 0635   BILITOT 0.4 01/01/2014 0635   GFRNONAA >90 01/01/2014 0635   GFRAA >90 01/01/2014 0635    Lab Results  Component Value Date/Time   CHOL 107 01/01/2014  6:35 AM    No components found with this basename: hga1c    Lab Results  Component Value Date/Time   AST 15 01/01/2014  6:35 AM    Assessment and Plan  COPD mixed type Patient symptomatically improved her continue with Symbicort oxygen, albuterol when necessary.  Essential hypertension Blood pressure is well controlled continue with current meds including Cozaar and bisoprolol.  Hyperlipidemia with target LDL less than 70 Continue with Crestor will repeat lipid panel on the next visit.  Prediabetes Recent A1c was 6.2%, I have advised patient for low carbohydrate diet.  Coronary artery disease involving native coronary artery of native heart without angina pectoris Patient denies any chest and will continue with current meds including Brilinta, patient following up with cardiology.   Need for prophylactic vaccination and inoculation  against influenza Shot given today.  Health Maintenance   -Influenza shot given today   Return in about 3 months (around 04/16/2014) for hypertension, hyperipidemia, COPD, CAD.  Lorayne Marek, MD

## 2014-01-15 NOTE — Patient Instructions (Signed)
Fat and Cholesterol Control Diet Fat and cholesterol levels in your blood and organs are influenced by your diet. High levels of fat and cholesterol may lead to diseases of the heart, small and large blood vessels, gallbladder, liver, and pancreas. CONTROLLING FAT AND CHOLESTEROL WITH DIET Although exercise and lifestyle factors are important, your diet is key. That is because certain foods are known to raise cholesterol and others to lower it. The goal is to balance foods for their effect on cholesterol and more importantly, to replace saturated and trans fat with other types of fat, such as monounsaturated fat, polyunsaturated fat, and omega-3 fatty acids. On average, a person should consume no more than 15 to 17 g of saturated fat daily. Saturated and trans fats are considered "bad" fats, and they will raise LDL cholesterol. Saturated fats are primarily found in animal products such as meats, butter, and cream. However, that does not mean you need to give up all your favorite foods. Today, there are good tasting, low-fat, low-cholesterol substitutes for most of the things you like to eat. Choose low-fat or nonfat alternatives. Choose round or loin cuts of red meat. These types of cuts are lowest in fat and cholesterol. Chicken (without the skin), fish, veal, and ground turkey breast are great choices. Eliminate fatty meats, such as hot dogs and salami. Even shellfish have little or no saturated fat. Have a 3 oz (85 g) portion when you eat lean meat, poultry, or fish. Trans fats are also called "partially hydrogenated oils." They are oils that have been scientifically manipulated so that they are solid at room temperature resulting in a longer shelf life and improved taste and texture of foods in which they are added. Trans fats are found in stick margarine, some tub margarines, cookies, crackers, and baked goods.  When baking and cooking, oils are a great substitute for butter. The monounsaturated oils are  especially beneficial since it is believed they lower LDL and raise HDL. The oils you should avoid entirely are saturated tropical oils, such as coconut and palm.  Remember to eat a lot from food groups that are naturally free of saturated and trans fat, including fish, fruit, vegetables, beans, grains (barley, rice, couscous, bulgur wheat), and pasta (without cream sauces).  IDENTIFYING FOODS THAT LOWER FAT AND CHOLESTEROL  Soluble fiber may lower your cholesterol. This type of fiber is found in fruits such as apples, vegetables such as broccoli, potatoes, and carrots, legumes such as beans, peas, and lentils, and grains such as barley. Foods fortified with plant sterols (phytosterol) may also lower cholesterol. You should eat at least 2 g per day of these foods for a cholesterol lowering effect.  Read package labels to identify low-saturated fats, trans fat free, and low-fat foods at the supermarket. Select cheeses that have only 2 to 3 g saturated fat per ounce. Use a heart-healthy tub margarine that is free of trans fats or partially hydrogenated oil. When buying baked goods (cookies, crackers), avoid partially hydrogenated oils. Breads and muffins should be made from whole grains (whole-wheat or whole oat flour, instead of "flour" or "enriched flour"). Buy non-creamy canned soups with reduced salt and no added fats.  FOOD PREPARATION TECHNIQUES  Never deep-fry. If you must fry, either stir-fry, which uses very little fat, or use non-stick cooking sprays. When possible, broil, bake, or roast meats, and steam vegetables. Instead of putting butter or margarine on vegetables, use lemon and herbs, applesauce, and cinnamon (for squash and sweet potatoes). Use nonfat   yogurt, salsa, and low-fat dressings for salads.  LOW-SATURATED FAT / LOW-FAT FOOD SUBSTITUTES Meats / Saturated Fat (g)  Avoid: Steak, marbled (3 oz/85 g) / 11 g  Choose: Steak, lean (3 oz/85 g) / 4 g  Avoid: Hamburger (3 oz/85 g) / 7  g  Choose: Hamburger, lean (3 oz/85 g) / 5 g  Avoid: Ham (3 oz/85 g) / 6 g  Choose: Ham, lean cut (3 oz/85 g) / 2.4 g  Avoid: Chicken, with skin, dark meat (3 oz/85 g) / 4 g  Choose: Chicken, skin removed, dark meat (3 oz/85 g) / 2 g  Avoid: Chicken, with skin, light meat (3 oz/85 g) / 2.5 g  Choose: Chicken, skin removed, light meat (3 oz/85 g) / 1 g Dairy / Saturated Fat (g)  Avoid: Whole milk (1 cup) / 5 g  Choose: Low-fat milk, 2% (1 cup) / 3 g  Choose: Low-fat milk, 1% (1 cup) / 1.5 g  Choose: Skim milk (1 cup) / 0.3 g  Avoid: Hard cheese (1 oz/28 g) / 6 g  Choose: Skim milk cheese (1 oz/28 g) / 2 to 3 g  Avoid: Cottage cheese, 4% fat (1 cup) / 6.5 g  Choose: Low-fat cottage cheese, 1% fat (1 cup) / 1.5 g  Avoid: Ice cream (1 cup) / 9 g  Choose: Sherbet (1 cup) / 2.5 g  Choose: Nonfat frozen yogurt (1 cup) / 0.3 g  Choose: Frozen fruit bar / trace  Avoid: Whipped cream (1 tbs) / 3.5 g  Choose: Nondairy whipped topping (1 tbs) / 1 g Condiments / Saturated Fat (g)  Avoid: Mayonnaise (1 tbs) / 2 g  Choose: Low-fat mayonnaise (1 tbs) / 1 g  Avoid: Butter (1 tbs) / 7 g  Choose: Extra light margarine (1 tbs) / 1 g  Avoid: Coconut oil (1 tbs) / 11.8 g  Choose: Olive oil (1 tbs) / 1.8 g  Choose: Corn oil (1 tbs) / 1.7 g  Choose: Safflower oil (1 tbs) / 1.2 g  Choose: Sunflower oil (1 tbs) / 1.4 g  Choose: Soybean oil (1 tbs) / 2.4 g  Choose: Canola oil (1 tbs) / 1 g Document Released: 04/20/2005 Document Revised: 08/15/2012 Document Reviewed: 07/19/2013 ExitCare Patient Information 2015 [place], [place]. This information is not intended to replace advice given to you by your health care provider. Make sure you discuss any questions you have with your health care provider. Diabetes Mellitus and Food It is important for you to manage your blood sugar (glucose) level. Your blood glucose level can be greatly affected by what you eat. Eating healthier  foods in the appropriate amounts throughout the day at about the same time each day will help you control your blood glucose level. It can also help slow or prevent worsening of your diabetes mellitus. Healthy eating may even help you improve the level of your blood pressure and reach or maintain a healthy weight.  HOW CAN FOOD AFFECT ME? Carbohydrates Carbohydrates affect your blood glucose level more than any other type of food. Your dietitian will help you determine how many carbohydrates to eat at each meal and teach you how to count carbohydrates. Counting carbohydrates is important to keep your blood glucose at a healthy level, especially if you are using insulin or taking certain medicines for diabetes mellitus. Alcohol Alcohol can cause sudden decreases in blood glucose (hypoglycemia), especially if you use insulin or take certain medicines for diabetes mellitus. Hypoglycemia can be a life-threatening  condition. Symptoms of hypoglycemia (sleepiness, dizziness, and disorientation) are similar to symptoms of having too much alcohol.  If your health care provider has given you approval to drink alcohol, do so in moderation and use the following guidelines:  Women should not have more than one drink per day, and men should not have more than two drinks per day. One drink is equal to:  12 oz of beer.  5 oz of wine.  1 oz of hard liquor.  Do not drink on an empty stomach.  Keep yourself hydrated. Have water, diet soda, or unsweetened iced tea.  Regular soda, juice, and other mixers might contain a lot of carbohydrates and should be counted. WHAT FOODS ARE NOT RECOMMENDED? As you make food choices, it is important to remember that all foods are not the same. Some foods have fewer nutrients per serving than other foods, even though they might have the same number of calories or carbohydrates. It is difficult to get your body what it needs when you eat foods with fewer nutrients. Examples of  foods that you should avoid that are high in calories and carbohydrates but low in nutrients include:  Trans fats (most processed foods list trans fats on the Nutrition Facts label).  Regular soda.  Juice.  Candy.  Sweets, such as cake, pie, doughnuts, and cookies.  Fried foods. WHAT FOODS CAN I EAT? Have nutrient-rich foods, which will nourish your body and keep you healthy. The food you should eat also will depend on several factors, including:  The calories you need.  The medicines you take.  Your weight.  Your blood glucose level.  Your blood pressure level.  Your cholesterol level. You also should eat a variety of foods, including:  Protein, such as meat, poultry, fish, tofu, nuts, and seeds (lean animal proteins are best).  Fruits.  Vegetables.  Dairy products, such as milk, cheese, and yogurt (low fat is best).  Breads, grains, pasta, cereal, rice, and beans.  Fats such as olive oil, trans fat-free margarine, canola oil, avocado, and olives. DOES EVERYONE WITH DIABETES MELLITUS HAVE THE SAME MEAL PLAN? Because every person with diabetes mellitus is different, there is not one meal plan that works for everyone. It is very important that you meet with a dietitian who will help you create a meal plan that is just right for you. Document Released: 01/15/2005 Document Revised: 04/25/2013 Document Reviewed: 03/17/2013 ExitCare Patient Information 2015 [employer]. This information is not intended to replace advice given to you by your health care provider. Make sure you discuss any questions you have with your health care provider.  

## 2014-03-13 ENCOUNTER — Other Ambulatory Visit: Payer: Self-pay

## 2014-03-13 MED ORDER — PANTOPRAZOLE SODIUM 40 MG PO TBEC: 40.0000 mg | DELAYED_RELEASE_TABLET | Freq: Every day | ORAL | Status: DC

## 2014-04-12 ENCOUNTER — Encounter (HOSPITAL_COMMUNITY): Payer: Self-pay | Admitting: Cardiovascular Disease

## 2014-05-09 ENCOUNTER — Other Ambulatory Visit: Payer: Self-pay | Admitting: Internal Medicine

## 2014-05-09 ENCOUNTER — Telehealth: Payer: Self-pay | Admitting: Internal Medicine

## 2014-05-09 ENCOUNTER — Other Ambulatory Visit: Payer: Self-pay

## 2014-05-09 ENCOUNTER — Telehealth: Payer: Self-pay

## 2014-05-09 MED ORDER — PANTOPRAZOLE SODIUM 40 MG PO TBEC: 40.0000 mg | DELAYED_RELEASE_TABLET | Freq: Every day | ORAL | Status: DC

## 2014-05-09 NOTE — Telephone Encounter (Signed)
Patient has come in today to request a medication refill for pantoprazole (PROTONIX) 40 MG tablet; please f/u with patient about this refill request

## 2014-05-09 NOTE — Telephone Encounter (Signed)
Patient here in lobby requesting a refill on his protonix [facility]

## 2014-08-06 ENCOUNTER — Ambulatory Visit: Payer: Self-pay | Admitting: Cardiovascular Disease

## 2014-10-09 ENCOUNTER — Ambulatory Visit: Payer: Medicaid Other | Admitting: Cardiovascular Disease

## 2015-01-17 ENCOUNTER — Ambulatory Visit (INDEPENDENT_AMBULATORY_CARE_PROVIDER_SITE_OTHER): Payer: Medicaid Other | Admitting: Cardiovascular Disease

## 2015-01-17 VITALS — BP 150/90 | HR 68 | Ht 66.0 in | Wt 127.0 lb

## 2015-01-17 DIAGNOSIS — I10 Essential (primary) hypertension: Secondary | ICD-10-CM

## 2015-01-17 DIAGNOSIS — I252 Old myocardial infarction: Secondary | ICD-10-CM

## 2015-01-17 DIAGNOSIS — Z79899 Other long term (current) drug therapy: Secondary | ICD-10-CM | POA: Diagnosis not present

## 2015-01-17 DIAGNOSIS — E785 Hyperlipidemia, unspecified: Secondary | ICD-10-CM | POA: Diagnosis not present

## 2015-01-17 DIAGNOSIS — R0602 Shortness of breath: Secondary | ICD-10-CM | POA: Diagnosis not present

## 2015-01-17 DIAGNOSIS — I25810 Atherosclerosis of coronary artery bypass graft(s) without angina pectoris: Secondary | ICD-10-CM

## 2015-01-17 DIAGNOSIS — J449 Chronic obstructive pulmonary disease, unspecified: Secondary | ICD-10-CM

## 2015-01-17 MED ORDER — LOSARTAN POTASSIUM 25 MG PO TABS: 25.0000 mg | ORAL_TABLET | Freq: Every day | ORAL | Status: DC

## 2015-01-17 MED ORDER — ROSUVASTATIN CALCIUM 40 MG PO TABS: 40.0000 mg | ORAL_TABLET | Freq: Every day | ORAL | Status: DC

## 2015-01-17 MED ORDER — TICAGRELOR 60 MG PO TABS: 60.0000 mg | ORAL_TABLET | Freq: Two times a day (BID) | ORAL | Status: DC

## 2015-01-17 MED ORDER — NITROGLYCERIN 0.4 MG SL SUBL: 0.4000 mg | SUBLINGUAL_TABLET | SUBLINGUAL | Status: DC | PRN

## 2015-01-17 MED ORDER — BISOPROLOL FUMARATE 5 MG PO TABS: 2.5000 mg | ORAL_TABLET | Freq: Every day | ORAL | Status: DC

## 2015-01-17 NOTE — Patient Instructions (Addendum)
Your physician recommends that you return for lab work fasting.  A chest x-ray takes a picture of the organs and structures inside the chest, including the heart, lungs, and blood vessels. This test can show several things, including, whether the heart is enlarges; whether fluid is building up in the lungs; and whether pacemaker / defibrillator leads are still in place.this will be done at [facility]. [employer].  Your physician has recommended you make the following change in your medication: the Brilinta has been changed to 60 mg twice a day and has been sent to your pharmacy. The bisoprolol has been decreased from 5 mg daily to 2.5 mg daily.( 1/2 tablet) Resume all other medications. Refills were also sent to the pharmacy.  Your physician wants you to follow-up in: 6 months or sooner if needed. You will receive a reminder letter in the mail two months in advance. If you don't receive a letter, please call our office to schedule the follow-up appointment.

## 2015-01-19 ENCOUNTER — Encounter: Payer: Self-pay | Admitting: Cardiovascular Disease

## 2015-01-19 DIAGNOSIS — I252 Old myocardial infarction: Secondary | ICD-10-CM | POA: Insufficient documentation

## 2015-01-19 NOTE — Progress Notes (Signed)
Patient ID: Timothy [name], male   DOB: 1956-01-01, 59 y.o.   MRN: [id number]    HPI: Timothy [name] is a 59 y.o. male presents for a one year follow-up cardiology evaluation.    [name] presented to [facility] in the early morning of 06/12/2013 after being found down at home. When EMS arrived he was in ventricular fibrillation. He was transported acutely to [facility]  cardiac catheterization laboratory and hypothermia protocol was implemented. Emergent catheterization by me revealed total occlusion of the RCA and he underwent successful intervention with ultimate insertion of a 2.5x18 mm DES post dilated to 2.75 mm with the percent occlusion reduced to 0% and with resumption of brisk TIMI 3 flow. He ultimately awoke and was intact neurologically after the hypothermia protocol completed his course. He did require levophed initially for hypotension. He was discharged on 06/19/2013.    He was sent home on aspirin and brilinta for dual anti-platelet therapy, metoprolol 75 mg twice a day lisinopril 2.5 mg, Crestor 20 mg. 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 At that time, I further titrate his metoprolol tartrate to 100 mg twice a day. I recommended laboratory be checked in the fasting state. 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  [name] never did undergo his studies. He stated that he was unable to afford the cost since his insurance has not yet become effective.  When I last saw him, did run out of his early the and I re-instituted therapy.  He also was on an ACE inhibitor and had significant wheezing and I recommended discontinuance of the ACE inhibitor.  He was ultimately started on losartan 25 mg.  He is now on bisoprolol 5 mg and tolerating this well from a pulmonary perspective.  He also is on Symbicort 160/4.5 and when necessary Ventolin without recent wheezing.  He underwent a echo Doppler  study on 11/06/2013 which was essentially normal; Ejection fraction 55-60% and he did not have any regional wall motion abnormality.  PA pressure was minimally elevated at 32 mm.  Over the past year, he has continued to remain fairly stable from a cardiovascular standpoint.  He is on disability.  He denies any chest pain and is unaware of any palpitations.  He admits to some fatigue.  He states over the past month.  He ran out of both his bisoprolol as well as his losartan.  He does wheeze occasionally. He presents for evaluation.    Past Medical History  Diagnosis Date  . Hypokalemia, replaced 06/19/2013  . Hypomagnesemia, replaced 06/19/2013  . Cardiac arrest, hypothermic protocol 06/12/2013  . NSTEMI (non-ST elevated myocardial infarction), DES to RCA 06/13/2013  . COPD exacerbation 06/12/2013  . Hyperlipidemia LDL goal < 70 06/19/2013  . Hypotension, requiring levophed 06/19/2013  . Bronchitis, chronic 06/19/2013  . Tobacco abuse 06/19/2013  . Altered mental status, improved at discharge 06/19/2013    Past Surgical History  Procedure Laterality Date  . No past surgeries    . Cardiac sstent    . Coronary angioplasty with stent placement  06/12/13    [employer]ience [place] DES to RCA, NSTEMI  . Left heart catheterization with coronary angiogram N/A 06/12/2013    Procedure: LEFT HEART CATHETERIZATION WITH CORONARY ANGIOGRAM;  Surgeon: Troy Sine, MD;  Location: [facility] CATH LAB;  Service: Cardiovascular;  Laterality: N/A;    Allergies  Allergen Reactions  . Aspirin Swelling    To lips.  . Ibuprofen Swelling    To lips.    Current Outpatient Prescriptions  Medication Sig Dispense Refill  . acetaminophen (TYLENOL) 325 MG tablet Take 2 tablets (650 mg total) by mouth every 4 (four) hours as needed for headache or mild pain.    Marland Kitchen albuterol (VENTOLIN HFA) 108 (90 BASE) MCG/ACT inhaler Inhale 2 puffs into the lungs every 6 (six) hours as needed for wheezing or shortness of breath.    Marland Kitchen aspirin EC 81 MG  tablet Take 81 mg by mouth daily.    . budesonide-formoterol (SYMBICORT) 160-4.5 MCG/ACT inhaler Inhale 2 puffs into the lungs 2 (two) times daily. 3 Inhaler 3  . esomeprazole (NEXIUM 24HR) 20 MG capsule Take 20 mg by mouth daily at 12 noon.    . nitroGLYCERIN (NITROSTAT) 0.4 MG SL tablet Place 1 tablet (0.4 mg total) under the tongue every 5 (five) minutes as needed for chest pain. 25 tablet 4  . bisoprolol (ZEBETA) 5 MG tablet Take 0.5 tablets (2.5 mg total) by mouth at bedtime. 15 tablet 6  . losartan (COZAAR) 25 MG tablet Take 1 tablet (25 mg total) by mouth daily. 30 tablet 5  . pantoprazole (PROTONIX) 40 MG tablet Take 1 tablet (40 mg total) by mouth daily. (Patient not taking: Reported on 01/17/2015) 30 tablet 2  . rosuvastatin (CRESTOR) 40 MG tablet Take 1 tablet (40 mg total) by mouth daily. 90 tablet 3  . ticagrelor (BRILINTA) 60 MG TABS tablet Take 1 tablet (60 mg total) by mouth 2 (two) times daily. 60 tablet 6   No current facility-administered medications for this visit.    Social History   Social History  . Marital Status: Divorced    Spouse Name: N/A  . Number of Children: N/A  . Years of Education: N/A   Occupational History  . Not on file.   Social History Main Topics  . Smoking status: Former Smoker -- 2.00 packs/day for 38 years    Types: Cigarettes    Quit date: 07/10/2008  . Smokeless tobacco: Never Used  . Alcohol Use: No  . Drug Use: No  . Sexual Activity: Yes   Other Topics Concern  . Not on file   Social History Narrative   Socially, he has a previous 30 year history of smoking, but quit smoking 5 years ago.  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  History reviewed. No pertinent family history.   ROS General: Negative; No fevers, chills, or night sweats;  HEENT: Negative; No changes in vision or hearing, sinus congestion, difficulty swallowing Pulmonary: Positive for occasional wheezing. Cardiovascular: Negative; No chest pain, presyncope,  syncope, palpatations, no edema GI: Negative; No nausea, vomiting, diarrhea, or abdominal pain GU: Negative; No dysuria, hematuria, or difficulty voiding Musculoskeletal: Negative; no myalgias, joint pain, or weakness Hematologic/Oncology: Negative; no easy bruising, bleeding Endocrine: Negative; no heat/cold intolerance; no diabetes Neuro: Negative; no changes in balance, headaches Skin: Negative; No rashes or skin lesions Psychiatric: Negative; No behavioral problems, depression Sleep: Negative; No snoring, daytime sleepiness, hypersomnolence, bruxism, restless legs, hypnogognic hallucinations, no cataplexy Other comprehensive 14 point system review is negative.   PE BP 150/90 mmHg  Pulse 68  Ht _0  (1.676 m)  Wt 127 lb (57.607 kg)  BMI 20.51 kg/m2  Wt Readings from Last 3 Encounters:  01/17/15 127 lb (57.607 kg)  01/15/14 135 lb 6.4 oz (61.417 kg)  01/01/14 131 lb 12.8  oz (59.784 kg)   General: Alert, oriented, no distress.  Skin: normal turgor, no rashes HEENT: Normocephalic, atraumatic. Pupils round and reactive; sclera anicteric;no lid lag. Extraocular muscles intact;; no xanthelasmas. Nose without nasal septal hypertrophy Mouth/Parynx benign; Mallinpatti scale 3 Neck: No JVD, no carotid bruits; normal carotid upstroke Lungs: Decreased breath sounds; no wheezing.  Chest wall: no tenderness to palpitation Heart: RRR, s1 s2 normal; faint 1/6 systolic murmur;no diastolic murmur, rub thrills or heaves Abdomen: soft, nontender; no hepatosplenomehaly, BS+; abdominal aorta nontender and not dilated by palpation. Back: no CVA tenderness Pulses 2+ Extremities: no clubbing cyanosis or edema, Homan's sign negative  Neurologic: grossly nonfocal; cranial nerves grossly normal. Psychologic: normal affect and mood.  ECG (independently read by me): Normal sinus rhythm at 68 bpm.  Normal ECG.  August 2015 ECG (independently read by me): Normal sinus rhythm at 69 beats per minute.   Normal intervals.  Normal EKG   Prior 09/04/2013 ECG (independently read by me): Normal sinus rhythm at 69 beats per minute.  A small Q wave in aVL.  Otherwise, no ECG evidence for recent myocardial infarction  Prior ECG (independently read by me) normal sinus rhythm at 72 beats per minute. QS in V1 and V2.  Prior 07/10/2013 ECG (independently read by me): Normal sinus rhythm 74 beats per minute. Normal intervals. No ECG evidence of his recent myocardial infarction  LABS: BMP Latest Ref Rng 01/01/2014 12/31/2013 08/30/2013  Glucose 70 - 99 mg/dL 208(H) 107(H) 105(H)  BUN 6 - 23 mg/dL _0 Creatinine 0.50 - 1.35 mg/dL 0.80 0.98 0.74  Sodium 137 - 147 mEq/L 142 138 138  Potassium 3.7 - 5.3 mEq/L 3.7 4.6 4.1  Chloride 96 - 112 mEq/L 103 101 98  CO2 19 - 32 mEq/L 16(L) 23 23  Calcium 8.4 - 10.5 mg/dL 9.3 9.7 10.3   Hepatic Function Latest Ref Rng 01/01/2014 07/21/2013 06/12/2013  Total Protein 6.0 - 8.3 g/dL 7.6 7.7 6.6  Albumin 3.5 - 5.2 g/dL 3.9 4.5 3.2(L)  AST 0 - 37 U/L 15 16 53(H)  ALT 0 - 53 U/L 23 23 34  Alk Phosphatase 39 - 117 U/L 70 78 90  Total Bilirubin 0.3 - 1.2 mg/dL 0.4 0.4 0.4   CBC Latest Ref Rng 01/01/2014 12/31/2013 08/30/2013  WBC 4.0 - 10.5 K/uL 9.3 14.5(H) 13.6(H)  Hemoglobin 13.0 - 17.0 g/dL 15.1 15.9 17.2(H)  Hematocrit 39.0 - 52.0 % 45.0 45.9 49.0  Platelets 150 - 400 K/uL 289 263 322   Lab Results  Component Value Date   MCV 92.6 01/01/2014   MCV 89.3 12/31/2013   MCV 91.4 08/30/2013   Lab Results  Component Value Date   TSH 1.353 07/21/2013    Lipid Panel     Component Value Date/Time   CHOL 107 01/01/2014 0635   TRIG 51 01/01/2014 0635   HDL 47 01/01/2014 0635   CHOLHDL 2.3 01/01/2014 0635   VLDL 10 01/01/2014 0635   LDLCALC 50 01/01/2014 0635      RADIOLOGY: No results found.    ASSESSMENT AND PLAN: Mr. Kavontae Pritchard is a 59 year old white male who suffered an out of hospital cardiac arrest on 06/12/2013 and was found to be in VF and  required successful defibrillation and CPR institution prior to arrival to the hospital. I took him acutely to the catheterization laboratory and he was found to have total occlusion of the mid RCA which was successfully intervened upon with restoration of TIMI-3 flow. Initial ejection  fraction was 50-55% with mild inferior hypocontractility. He has been without recurrent chest pain symptoms.  His echo Doppler study shows normalization of LV function and he does not have any regional wall motion abnormalities.  He had been on low-dose bisoprolol and losartan therapy, but has not taken this medication in approximately 4 weeks since running out.  I am recommending resumption of low-dose losartan 25 no grams daily and instead of his previous 5 mg a bisoprolol.  Him back on 2.5 mg.  I also reviewed the [place] child data and I will reduce his Brilinta from 90 mg to 60 mg.   A complete set of blood work will be obtained in the fasting state.  He does have decreased breath sounds diffusely and a previous very long tobacco history.  The p.m. lateral chest x-ray will also be obtained.  He will continue to take Nexium for GERD.  He is on Symbicort and when necessary albuterol for his COPD/asthma.  I will see him in 6 months for reevaluation or sooner as needed.  Troy Sine, MD, [facility]  01/19/2015 11:52 AM

## 2015-01-21 ENCOUNTER — Ambulatory Visit
Admission: RE | Admit: 2015-01-21 | Discharge: 2015-01-21 | Disposition: A | Discharge status: Home / Self Care | Payer: Medicaid Other | Source: Ambulatory Visit | Attending: Cardiovascular Disease | Admitting: Cardiovascular Disease

## 2015-01-21 DIAGNOSIS — R0602 Shortness of breath: Secondary | ICD-10-CM

## 2015-01-22 LAB — COMPREHENSIVE METABOLIC PANEL
ALT: 16 U/L (ref 9–46)
AST: 18 U/L (ref 10–35)
Albumin: 4.3 g/dL (ref 3.6–5.1)
Alkaline Phosphatase: 70 U/L (ref 40–115)
BUN: 11 mg/dL (ref 7–25)
CHLORIDE: 104 mmol/L (ref 98–110)
CO2: 31 mmol/L (ref 20–31)
Calcium: 9.3 mg/dL (ref 8.6–10.3)
Creat: 0.93 mg/dL (ref 0.70–1.33)
Glucose, Bld: 96 mg/dL (ref 65–99)
Potassium: 3.9 mmol/L (ref 3.5–5.3)
Sodium: 141 mmol/L (ref 135–146)
TOTAL PROTEIN: 6.8 g/dL (ref 6.1–8.1)
Total Bilirubin: 0.6 mg/dL (ref 0.2–1.2)

## 2015-01-22 LAB — LIPID PANEL
CHOLESTEROL: 112 mg/dL — AB (ref 125–200)
HDL: 47 mg/dL (ref >=40)
LDL CALC: 45 mg/dL (ref <130)
Total CHOL/HDL Ratio: 2.4 Ratio (ref <=5.0)
Triglycerides: 98 mg/dL (ref <150)
VLDL: 20 mg/dL (ref <30)

## 2015-01-22 LAB — CBC
HCT: 46.6 % (ref 39.0–52.0)
Hemoglobin: 16.2 g/dL (ref 13.0–17.0)
MCH: 31.4 pg (ref 26.0–34.0)
MCHC: 34.8 g/dL (ref 30.0–36.0)
MCV: 90.3 fL (ref 78.0–100.0)
MPV: 9.4 fL (ref 8.6–12.4)
Platelets: 267 10*3/uL (ref 150–400)
RBC: 5.16 MIL/uL (ref 4.22–5.81)
RDW: 13.2 % (ref 11.5–15.5)
WBC: 11.6 10*3/uL — ABNORMAL HIGH (ref 4.0–10.5)

## 2015-01-22 LAB — TSH: TSH: 0.971 u[IU]/mL (ref 0.350–4.500)

## 2015-01-23 ENCOUNTER — Encounter: Payer: Self-pay | Admitting: *Deleted

## 2015-02-09 IMAGING — CR DG CHEST 2V
2 series · 2 of 2 positions shown · non-contrast
Comparison: 08/30/2013

CLINICAL DATA: sob

EXAM:
CHEST - 2 VIEW

[w chest pa]
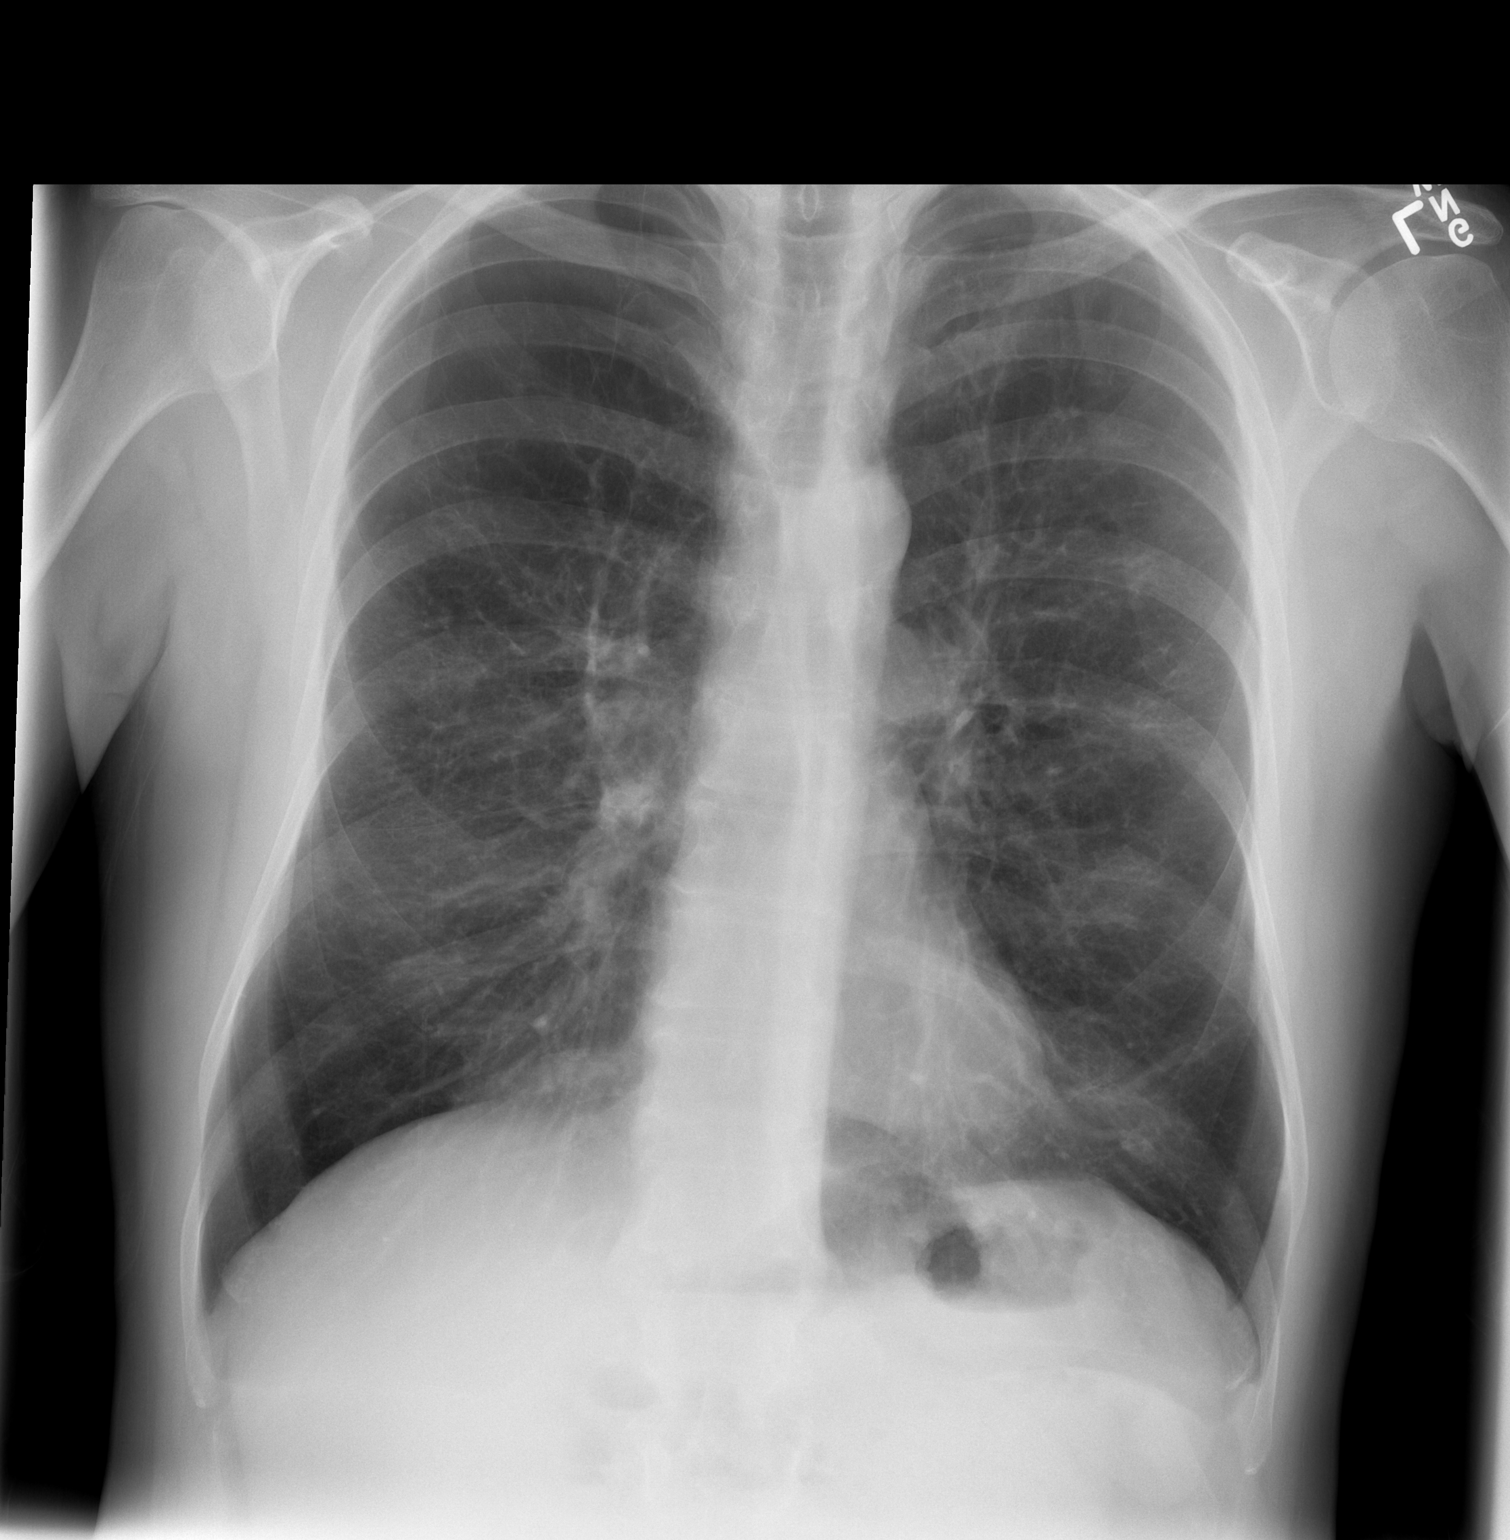

[w chest lat]
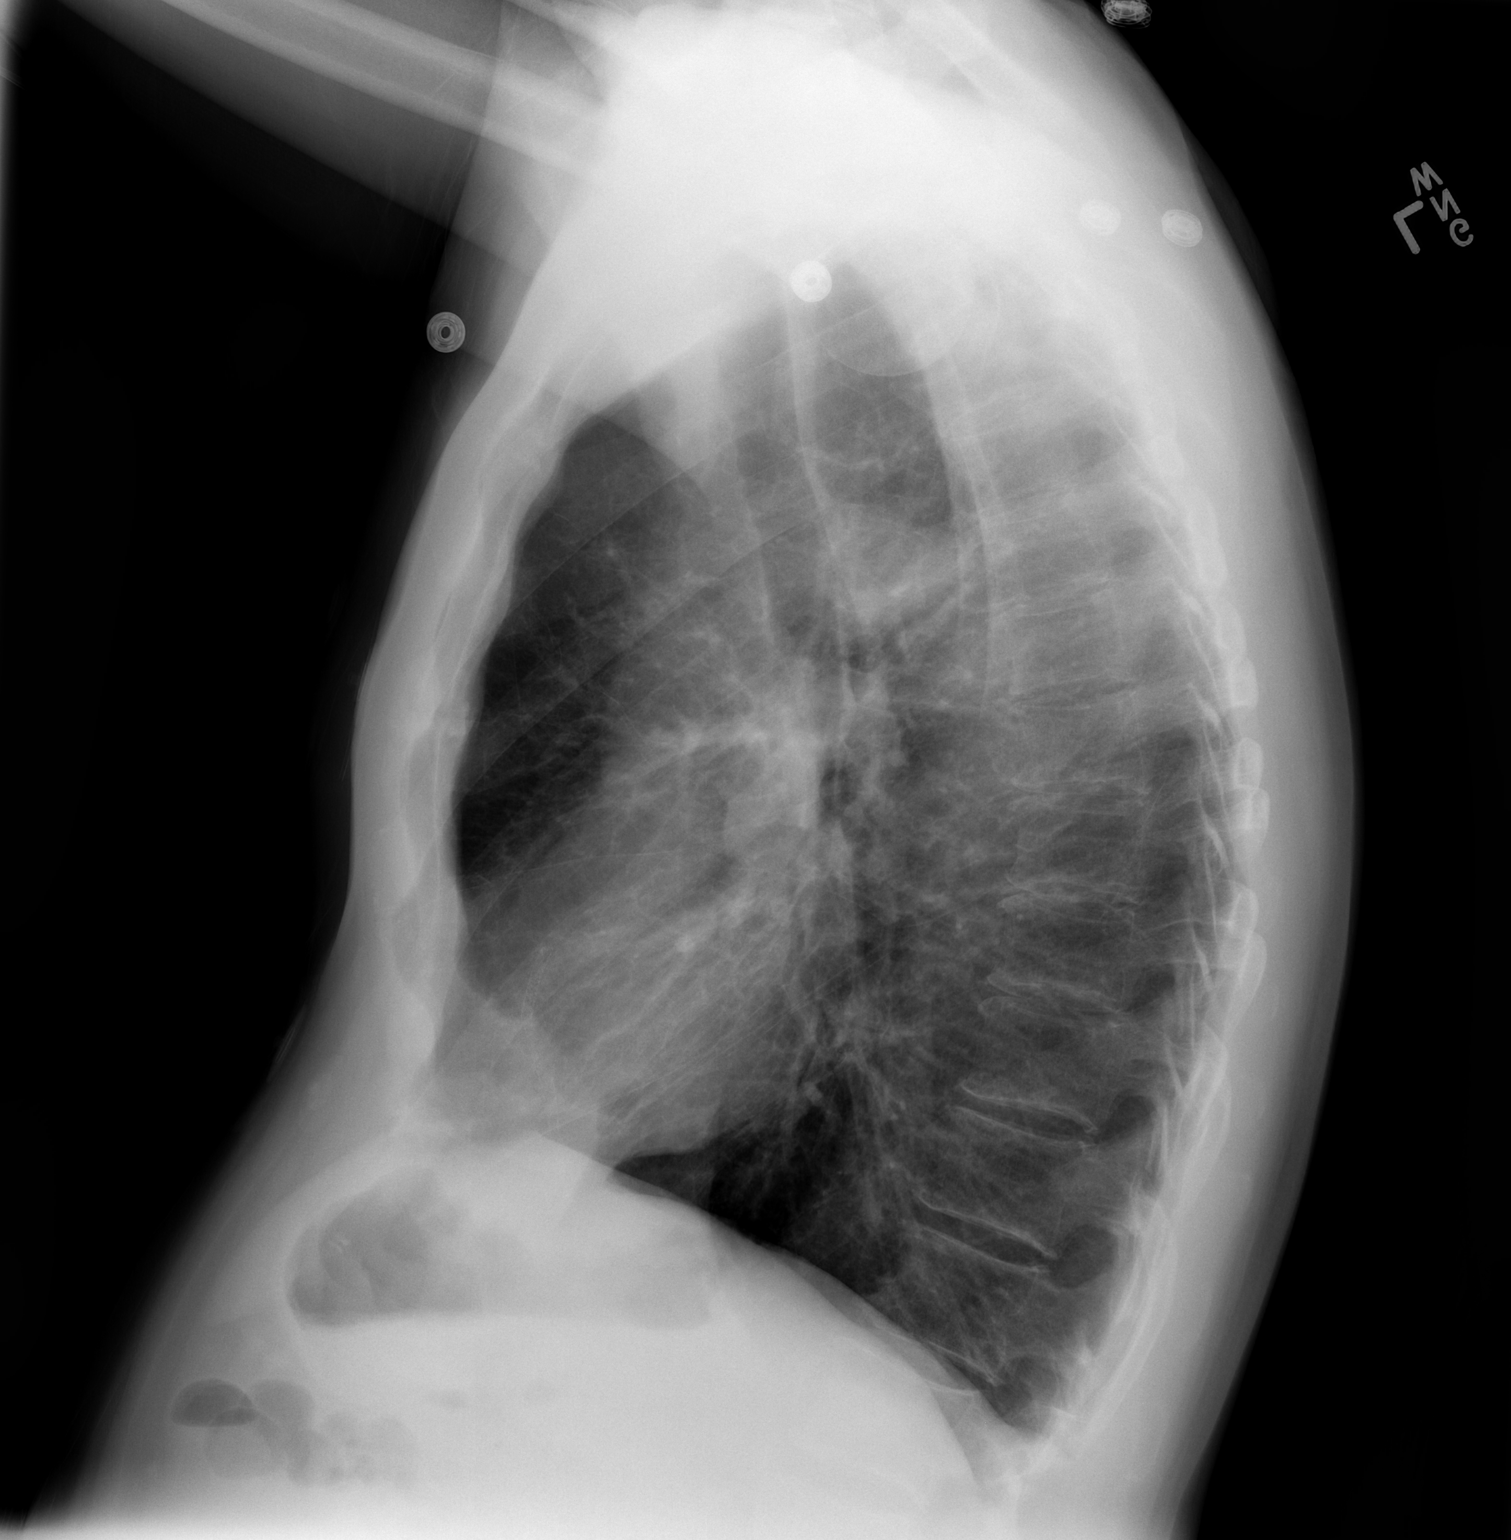

[2 of 2 positions shown; findings below may reference images not displayed]

FINDINGS: Mild hyperinflation. Attenuated peripheral bronchovascular markings,
and coarse perihilar interstitial markings as before. No confluent
airspace infiltrate or overt edema. Heart size normal.
No effusion.
Visualized skeletal structures are unremarkable.
IMPRESSION: 1. Chronic interstitial changes without acute or superimposed
abnormality.

## 2015-05-09 ENCOUNTER — Telehealth: Payer: Self-pay

## 2015-05-09 MED ORDER — TICAGRELOR 60 MG PO TABS: 60.0000 mg | ORAL_TABLET | Freq: Two times a day (BID) | ORAL | Status: DC

## 2015-05-09 MED ORDER — ROSUVASTATIN CALCIUM 40 MG PO TABS: 40.0000 mg | ORAL_TABLET | Freq: Every day | ORAL | Status: DC

## 2015-05-09 MED ORDER — BUDESONIDE-FORMOTEROL FUMARATE 160-4.5 MCG/ACT IN AERO: 2.0000 | INHALATION_SPRAY | Freq: Two times a day (BID) | RESPIRATORY_TRACT | Status: DC

## 2015-05-09 NOTE — Telephone Encounter (Signed)
Patient walked in office requesting refills for crestor,brilinta,symbicort.Refills sent to pharmacy.

## 2015-05-10 MED FILL — SYMBICORT 160-4.5 MCG INH: 160-4.5 | 30 days supply | Qty: 10 | Fill #0

## 2015-05-10 MED FILL — BRILINTA 60 MG TABLET: 60 | 30 days supply | Qty: 60 | Fill #0

## 2015-05-23 MED FILL — LOSARTAN POTASSIUM 25 MG TA: 25 | 30 days supply | Qty: 30 | Fill #4

## 2015-05-28 MED FILL — BISOPROLOL FUMARATE 5 MG TA: 5 | 30 days supply | Qty: 15 | Fill #4

## 2015-06-12 MED FILL — BRILINTA 60 MG TABLET: 60 | 30 days supply | Qty: 60 | Fill #1

## 2015-06-12 MED FILL — SYMBICORT 160-4.5 MCG INH: 160-4.5 | 30 days supply | Qty: 10 | Fill #1

## 2015-06-28 ENCOUNTER — Other Ambulatory Visit: Payer: Self-pay | Admitting: Cardiovascular Disease

## 2015-06-28 MED ORDER — ATORVASTATIN CALCIUM 80 MG PO TABS: 80.0000 mg | ORAL_TABLET | Freq: Every day | ORAL | Status: DC

## 2015-06-28 MED FILL — LOSARTAN POTASSIUM 25 MG TA: 25 | 30 days supply | Qty: 30 | Fill #5

## 2015-06-28 MED FILL — ATORVASTATIN 80 MG TABLET: 80 | 30 days supply | Qty: 30 | Fill #0

## 2015-06-28 MED FILL — BISOPROLOL FUMARATE 5 MG TA: 5 | 30 days supply | Qty: 15 | Fill #5

## 2015-06-28 NOTE — Telephone Encounter (Signed)
Per pharmacy faxed refill request for crestor 40mg  - insurance will not cover. Spoke with Dr. Claiborne Billings - OK to change to atorvastatin 80mg  QD Rx(s) sent to pharmacy electronically. LM for patient that med will be changed

## 2015-07-12 MED FILL — SYMBICORT 160-4.5 MCG INH: 160-4.5 | 30 days supply | Qty: 10 | Fill #2

## 2015-07-12 MED FILL — BRILINTA 60 MG TABLET: 60 | 30 days supply | Qty: 60 | Fill #2

## 2015-08-02 ENCOUNTER — Encounter (HOSPITAL_COMMUNITY): Payer: Self-pay | Admitting: Emergency Medicine

## 2015-08-02 DIAGNOSIS — R109 Unspecified abdominal pain: Secondary | ICD-10-CM | POA: Insufficient documentation

## 2015-08-02 DIAGNOSIS — Z9889 Other specified postprocedural states: Secondary | ICD-10-CM | POA: Insufficient documentation

## 2015-08-02 DIAGNOSIS — R51 Headache: Secondary | ICD-10-CM | POA: Diagnosis not present

## 2015-08-02 DIAGNOSIS — Z7902 Long term (current) use of antithrombotics/antiplatelets: Secondary | ICD-10-CM | POA: Diagnosis not present

## 2015-08-02 DIAGNOSIS — I252 Old myocardial infarction: Secondary | ICD-10-CM | POA: Diagnosis not present

## 2015-08-02 DIAGNOSIS — J449 Chronic obstructive pulmonary disease, unspecified: Secondary | ICD-10-CM | POA: Insufficient documentation

## 2015-08-02 DIAGNOSIS — Z79899 Other long term (current) drug therapy: Secondary | ICD-10-CM | POA: Insufficient documentation

## 2015-08-02 DIAGNOSIS — E785 Hyperlipidemia, unspecified: Secondary | ICD-10-CM | POA: Insufficient documentation

## 2015-08-02 DIAGNOSIS — Z9861 Coronary angioplasty status: Secondary | ICD-10-CM | POA: Diagnosis not present

## 2015-08-02 DIAGNOSIS — R911 Solitary pulmonary nodule: Secondary | ICD-10-CM | POA: Diagnosis not present

## 2015-08-02 DIAGNOSIS — Z7982 Long term (current) use of aspirin: Secondary | ICD-10-CM | POA: Diagnosis not present

## 2015-08-02 DIAGNOSIS — R319 Hematuria, unspecified: Secondary | ICD-10-CM | POA: Diagnosis not present

## 2015-08-02 DIAGNOSIS — Z87891 Personal history of nicotine dependence: Secondary | ICD-10-CM | POA: Insufficient documentation

## 2015-08-02 LAB — COMPREHENSIVE METABOLIC PANEL
ALK PHOS: 69 U/L (ref 38–126)
ALT: 15 U/L — ABNORMAL LOW (ref 17–63)
AST: 17 U/L (ref 15–41)
Albumin: 4.1 g/dL (ref 3.5–5.0)
Anion gap: 11 (ref 5–15)
BUN: 13 mg/dL (ref 6–20)
CALCIUM: 9.9 mg/dL (ref 8.9–10.3)
CHLORIDE: 104 mmol/L (ref 101–111)
CO2: 25 mmol/L (ref 22–32)
CREATININE: 0.92 mg/dL (ref 0.61–1.24)
Glucose, Bld: 114 mg/dL — ABNORMAL HIGH (ref 65–99)
Potassium: 4.2 mmol/L (ref 3.5–5.1)
Sodium: 140 mmol/L (ref 135–145)
Total Bilirubin: 0.5 mg/dL (ref 0.3–1.2)
Total Protein: 7 g/dL (ref 6.5–8.1)

## 2015-08-02 LAB — URINALYSIS, ROUTINE W REFLEX MICROSCOPIC
BILIRUBIN URINE: NEGATIVE
GLUCOSE, UA: NEGATIVE mg/dL
KETONES UR: NEGATIVE mg/dL
Leukocytes, UA: NEGATIVE
Nitrite: NEGATIVE
PROTEIN: NEGATIVE mg/dL
Specific Gravity, Urine: 1.017 (ref 1.005–1.030)
pH: 6.5 (ref 5.0–8.0)

## 2015-08-02 LAB — URINE MICROSCOPIC-ADD ON

## 2015-08-02 LAB — CBC
HCT: 46 % (ref 39.0–52.0)
Hemoglobin: 15.5 g/dL (ref 13.0–17.0)
MCH: 30.8 pg (ref 26.0–34.0)
MCHC: 33.7 g/dL (ref 30.0–36.0)
MCV: 91.5 fL (ref 78.0–100.0)
Platelets: 289 10*3/uL (ref 150–400)
RBC: 5.03 MIL/uL (ref 4.22–5.81)
RDW: 13.1 % (ref 11.5–15.5)
WBC: 12.1 10*3/uL — ABNORMAL HIGH (ref 4.0–10.5)

## 2015-08-02 NOTE — ED Notes (Signed)
Pt. reports LLQ pain with hematuria and oliguria onset this week , denies fever or chills.

## 2015-08-03 ENCOUNTER — Emergency Department (HOSPITAL_COMMUNITY)
Admission: EM | Admit: 2015-08-03 | Discharge: 2015-08-03 | Disposition: A | Discharge status: Home / Self Care | Payer: Medicaid Other | Attending: Emergency Medicine | Admitting: Emergency Medicine

## 2015-08-03 ENCOUNTER — Emergency Department (HOSPITAL_COMMUNITY): Payer: Medicaid Other

## 2015-08-03 DIAGNOSIS — R319 Hematuria, unspecified: Secondary | ICD-10-CM

## 2015-08-03 DIAGNOSIS — R109 Unspecified abdominal pain: Secondary | ICD-10-CM

## 2015-08-03 DIAGNOSIS — R911 Solitary pulmonary nodule: Secondary | ICD-10-CM

## 2015-08-03 MED ORDER — OXYCODONE-ACETAMINOPHEN 5-325 MG PO TABS
2.0000 | ORAL_TABLET | Freq: Once | ORAL | Status: AC
  Administered 2015-08-03: 2 via ORAL
  Filled 2015-08-03: qty 2

## 2015-08-03 NOTE — Discharge Instructions (Signed)
Flank Pain Flank pain refers to pain that is located on the side of the body between the upper abdomen and the back. The pain may occur over a short period of time (acute) or may be long-term or reoccurring (chronic). It may be mild or severe. Flank pain can be caused by many things. CAUSES  Some of the more common causes of flank pain include:  Muscle strains.   Muscle spasms.   A disease of your spine (vertebral disk disease).   A lung infection (pneumonia).   Fluid around your lungs (pulmonary edema).   A kidney infection.   Kidney stones.   A very painful skin rash caused by the chickenpox virus (shingles).   Gallbladder disease.  [place] care will depend on the cause of your pain. In general,  Rest as directed by your caregiver.  Drink enough fluids to keep your urine clear or pale yellow.  Only take over-the-counter or prescription medicines as directed by your caregiver. Some medicines may help relieve the pain.  Tell your caregiver about any changes in your pain.  Follow up with your caregiver as directed. SEEK IMMEDIATE MEDICAL CARE IF:   Your pain is not controlled with medicine.   You have new or worsening symptoms.  Your pain increases.   You have abdominal pain.   You have shortness of breath.   You have persistent nausea or vomiting.   You have swelling in your abdomen.   You feel faint or pass out.   You have blood in your urine.  You have a fever or persistent symptoms for more than 2-3 days.  You have a fever and your symptoms suddenly get worse. MAKE SURE YOU:   Understand these instructions.  Will watch your condition.  Will get help right away if you are not doing well or get worse.   This information is not intended to replace advice given to you by your health care provider. Make sure you discuss any questions you have with your health care provider.   Document Released: 06/11/2005 Document  Revised: 01/13/2012 Document Reviewed: 12/03/2011 Elsevier Interactive Patient Education 2016 [place] TO SEE UROLOGY SOON FOR CHECK OF YOUR BLADDER TO MAKE SURE YOU DON'T HAVE CANCER

## 2015-08-03 NOTE — ED Provider Notes (Signed)
CSN: [id number]     Arrival date & time 08/02/15  2209 History  By signing my name below, I, Timothy [name], attest that this documentation has been prepared under the direction and in the presence of Timothy Fraise, MD. Electronically Signed: Randa [name], ED Scribe. 08/03/2015. 12:59 AM.    Chief Complaint  Patient presents with  . Abdominal Pain  . Hematuria   Patient is a 60 y.o. male presenting with abdominal pain and hematuria. The history is provided by the patient. No language interpreter was used.  Abdominal Pain Associated symptoms: hematuria   Associated symptoms: no diarrhea, no dysuria, no fever, no nausea and no vomiting   Hematuria Associated symptoms include abdominal pain and headaches.   HPI Comments: Timothy [name] is a 60 y.o. male who presents to the Emergency Department complaining of new LLQ abdominal pain onset today. Pt states that it feels as if his left kidney is not draining. He describes the pain as if he has been waiting to use the bathroom for an extended period of time. Pt does report hematuria 2 days prior and intermittent HA. Pt doesn't report any alleviating factors. Pt denies fever, chills, nausea, vomiting, diarrhea or dysuria.    Past Medical History  Diagnosis Date  . Hypokalemia, replaced 06/19/2013  . Hypomagnesemia, replaced 06/19/2013  . Cardiac arrest, hypothermic protocol 06/12/2013  . NSTEMI (non-ST elevated myocardial infarction), DES to RCA 06/13/2013  . COPD exacerbation ([place]) 06/12/2013  . Hyperlipidemia LDL goal < 70 06/19/2013  . Hypotension, requiring levophed 06/19/2013  . Bronchitis, chronic ([place]) 06/19/2013  . Tobacco abuse 06/19/2013  . Altered mental status, improved at discharge 06/19/2013   Past Surgical History  Procedure Laterality Date  . No past surgeries    . Cardiac sstent    . Coronary angioplasty with stent placement  06/12/13    [place] DES to RCA, NSTEMI  . Left heart catheterization with coronary angiogram N/A  06/12/2013    Procedure: LEFT HEART CATHETERIZATION WITH CORONARY ANGIOGRAM;  Surgeon: Troy Sine, MD;  Location: [facility] CATH LAB;  Service: Cardiovascular;  Laterality: N/A;   No family history on file. Social History  Substance Use Topics  . Smoking status: Former Smoker -- 2.00 packs/day for 38 years    Types: Cigarettes    Quit date: 07/10/2008  . Smokeless tobacco: Never Used  . Alcohol Use: No    Review of Systems  Constitutional: Negative for fever.  Gastrointestinal: Positive for abdominal pain. Negative for nausea, vomiting and diarrhea.  Genitourinary: Positive for hematuria. Negative for dysuria.  Neurological: Positive for headaches.  All other systems reviewed and are negative.     Allergies  Aspirin and Ibuprofen  Home Medications   Prior to Admission medications   Medication Sig Start Date End Date Taking? Authorizing Provider  acetaminophen (TYLENOL) 325 MG tablet Take 2 tablets (650 mg total) by mouth every 4 (four) hours as needed for headache or mild pain. 06/19/13   Isaiah Serge, NP  albuterol (VENTOLIN HFA) 108 (90 BASE) MCG/ACT inhaler Inhale 2 puffs into the lungs every 6 (six) hours as needed for wheezing or shortness of breath.    Historical Provider, MD  aspirin EC 81 MG tablet Take 81 mg by mouth daily.    Historical Provider, MD  atorvastatin (LIPITOR) 80 MG tablet Take 1 tablet (80 mg total) by mouth daily. 06/28/15   Troy Sine, MD  bisoprolol (ZEBETA) 5 MG tablet Take 0.5 tablets (2.5 mg total) by  mouth at bedtime. 01/17/15   Troy Sine, MD  budesonide-formoterol [facility]) 160-4.5 MCG/ACT inhaler Inhale 2 puffs into the lungs 2 (two) times daily. 05/09/15   Troy Sine, MD  esomeprazole (NEXIUM 24HR) 20 MG capsule Take 20 mg by mouth daily at 12 noon.    Historical Provider, MD  losartan (COZAAR) 25 MG tablet Take 1 tablet (25 mg total) by mouth daily. 01/17/15   Troy Sine, MD  nitroGLYCERIN (NITROSTAT) 0.4 MG SL tablet Place 1 tablet  (0.4 mg total) under the tongue every 5 (five) minutes as needed for chest pain. 01/17/15   Troy Sine, MD  pantoprazole (PROTONIX) 40 MG tablet Take 1 tablet (40 mg total) by mouth daily. Patient not taking: Reported on 01/17/2015 05/09/14   Lorayne Marek, MD  ticagrelor (BRILINTA) 60 MG TABS tablet Take 1 tablet (60 mg total) by mouth 2 (two) times daily. 05/09/15   Troy Sine, MD   BP 125/80 mmHg  Pulse 106  Temp(Src) 98 F (36.7 C) (Oral)  Resp 20  Ht 5\' 5"  (1.651 m)  Wt 132 lb 2 oz (59.932 kg)  BMI 21.99 kg/m2  SpO2 93%   Physical Exam  CONSTITUTIONAL: disheveled   HEAD: Normocephalic/atraumatic EYES: EOMI/PERRL ENMT: Mucous membranes moist NECK: supple no meningeal signs SPINE/BACK:entire spine nontender CV: S1/S2 noted, no murmurs/rubs/gallops noted LUNGS: scattered wheeze noted, no apparent distress ABDOMEN: soft, nontender, no rebound or guarding, bowel sounds noted throughout abdomen [id number] cva tenderness NEURO: Pt is awake/alert/appropriate, moves all extremitiesx4.  No facial droop.   EXTREMITIES: pulses normal/equal, full ROM SKIN: warm, color normal PSYCH: no abnormalities of mood noted, alert and oriented to situation  ED Course  Procedures  Medications  oxyCODONE-acetaminophen (PERCOCET/ROXICET) 5-325 MG per tablet 2 tablet (2 tablets Oral Given 08/03/15 0125)    DIAGNOSTIC STUDIES: Oxygen Saturation is 93% on RA, adequate by my interpretation.    COORDINATION OF CARE: 12:57 AM-Discussed treatment plan with pt at bedside and pt agreed to plan.   No stone on CT imaging May have bladder lesion May also have lung nodule Pt advised of these findings Needs close urology f/u Needs close PCP followup for lung nodule Advised these may represent cancer Pt agreeable Stable for d/ home  Labs Review Labs Reviewed  COMPREHENSIVE METABOLIC PANEL - Abnormal; Notable for the following:    Glucose, Bld 114 (*)    ALT 15 (*)    All other components within  normal limits  CBC - Abnormal; Notable for the following:    WBC 12.1 (*)    All other components within normal limits  URINALYSIS, ROUTINE W REFLEX MICROSCOPIC (NOT AT [facility]) - Abnormal; Notable for the following:    APPearance CLOUDY (*)    Hgb urine dipstick MODERATE (*)    All other components within normal limits  URINE MICROSCOPIC-ADD ON - Abnormal; Notable for the following:    Squamous Epithelial / LPF 0-5 (*)    Bacteria, UA RARE (*)    Crystals CA OXALATE CRYSTALS (*)    All other components within normal limits    Imaging Review Ct Abdomen Pelvis Wo Contrast  08/03/2015  CLINICAL DATA:  Left lower quadrant pain with hematuria and oliguria. Onset this week. EXAM: CT ABDOMEN AND PELVIS WITHOUT CONTRAST TECHNIQUE: Multidetector CT imaging of the abdomen and pelvis was performed following the standard protocol without IV contrast. COMPARISON:  None. FINDINGS: Lower chest: Minimal subpleural nodularity at approximately 2 mm in the right lower lobe on  image 3/series 6. Centrilobular emphysema. Normal heart size without pericardial or pleural effusion. Right coronary artery atherosclerosis. Hepatobiliary: Normal liver. Normal gallbladder, without biliary ductal dilatation. Pancreas: Normal, without mass or ductal dilatation. Spleen: Normal in size, without focal abnormality. Adrenals/Urinary Tract: Normal adrenal glands. No renal calculi or hydronephrosis. No hydroureter or ureteric calculi. Hypoattenuation within the right side of the bladder measures 2.2 x 2.7 cm, including on image 67/series 2. Possible left-sided bladder hyper attenuation and coronal image 52. No bladder calculi. Stomach/Bowel: Normal stomach, without wall thickening. Scattered colonic diverticula. Normal terminal ileum and appendix. Normal small bowel. Vascular/Lymphatic: Aortic and branch vessel atherosclerosis. No abdominopelvic adenopathy. Reproductive: Mild prostatomegaly. Other: No significant free fluid. A small fat  containing left inguinal hernia. Musculoskeletal: Bilateral L5 pars defects. IMPRESSION: 1.  No urinary tract calculi or hydronephrosis. 2. Hyper attenuation within the right side of the bladder. Equivocal hyper attenuation in the left bladder base. Although this could represent blood clot, solid bladder neoplasm cannot be excluded. Consider nonemergent urology consultation to allow eventual cystoscopy. In addition, non emergent dedicated hematuria protocol CT may be informative. 3. Tiny right lower lobe pulmonary nodule. Non-contrast chest CT can be considered in 12 months, given risk factor(s). This recommendation follows the consensus statement: Guidelines for Management of Incidental Pulmonary Nodules Detected on CT Images:From the Fleischner Society 2017; published online before print (10.1148/radiol.SG:5268862). 4.  Atherosclerosis, including within the coronary arteries. Electronically Signed   By: Abigail Miyamoto M.D.   On: 08/03/2015 01:35   I have personally reviewed and evaluated these lab results as part of my medical decision-making.   MDM   Final diagnoses:  Flank pain  Hematuria  Pulmonary nodule      Nursing notes including past medical history and social history reviewed and considered in documentation Labs/vital reviewed myself and considered during evaluation   I personally performed the services described in this documentation, which was scribed in my presence. The recorded information has been reviewed and is accurate.        Timothy Fraise, MD 08/03/15 [phone]

## 2015-08-03 NOTE — ED Notes (Signed)
Patient transported to CT 

## 2015-08-04 LAB — URINE CULTURE: Culture: 8000

## 2015-08-14 MED FILL — SYMBICORT 160-4.5 MCG INH: 160-4.5 | 30 days supply | Qty: 10 | Fill #3

## 2015-08-14 MED FILL — BRILINTA 60 MG TABLET: 60 | 30 days supply | Qty: 60 | Fill #3

## 2015-09-26 MED FILL — SYMBICORT 160-4.5 MCG INH: 160-4.5 | 30 days supply | Qty: 10 | Fill #4

## 2015-10-21 ENCOUNTER — Ambulatory Visit: Payer: Self-pay | Attending: Internal Medicine | Admitting: Internal Medicine

## 2015-10-21 ENCOUNTER — Encounter: Payer: Self-pay | Admitting: Internal Medicine

## 2015-10-21 VITALS — BP 128/75 | HR 71 | Temp 98.5°F | Resp 18 | Ht 66.0 in

## 2015-10-21 DIAGNOSIS — I469 Cardiac arrest, cause unspecified: Secondary | ICD-10-CM

## 2015-10-21 DIAGNOSIS — Z87891 Personal history of nicotine dependence: Secondary | ICD-10-CM | POA: Insufficient documentation

## 2015-10-21 DIAGNOSIS — R911 Solitary pulmonary nodule: Secondary | ICD-10-CM | POA: Insufficient documentation

## 2015-10-21 DIAGNOSIS — I2510 Atherosclerotic heart disease of native coronary artery without angina pectoris: Secondary | ICD-10-CM | POA: Insufficient documentation

## 2015-10-21 DIAGNOSIS — Z79899 Other long term (current) drug therapy: Secondary | ICD-10-CM | POA: Insufficient documentation

## 2015-10-21 DIAGNOSIS — Z126 Encounter for screening for malignant neoplasm of bladder: Secondary | ICD-10-CM

## 2015-10-21 DIAGNOSIS — I10 Essential (primary) hypertension: Secondary | ICD-10-CM | POA: Insufficient documentation

## 2015-10-21 DIAGNOSIS — J449 Chronic obstructive pulmonary disease, unspecified: Secondary | ICD-10-CM | POA: Insufficient documentation

## 2015-10-21 LAB — LIPID PANEL
CHOLESTEROL: 252 mg/dL — AB (ref 125–200)
HDL: 52 mg/dL (ref >=40)
LDL CALC: 162 mg/dL — AB (ref <130)
TRIGLYCERIDES: 188 mg/dL — AB (ref <150)
Total CHOL/HDL Ratio: 4.8 Ratio (ref <=5.0)
VLDL: 38 mg/dL — AB (ref <30)

## 2015-10-21 LAB — BASIC METABOLIC PANEL WITH GFR
BUN: 13 mg/dL (ref 7–25)
CHLORIDE: 106 mmol/L (ref 98–110)
CO2: 24 mmol/L (ref 20–31)
CREATININE: 0.74 mg/dL (ref 0.70–1.33)
Calcium: 9.4 mg/dL (ref 8.6–10.3)
GFR, Est Non African American: >89 mL/min (ref >=60)
GLUCOSE: 104 mg/dL — AB (ref 65–99)
POTASSIUM: 4 mmol/L (ref 3.5–5.3)
Sodium: 140 mmol/L (ref 135–146)

## 2015-10-21 MED ORDER — ASPIRIN EC 81 MG PO TBEC: 81.0000 mg | DELAYED_RELEASE_TABLET | Freq: Every day | ORAL | Status: AC

## 2015-10-21 MED ORDER — BISOPROLOL FUMARATE 5 MG PO TABS: 2.5000 mg | ORAL_TABLET | Freq: Every day | ORAL | Status: DC

## 2015-10-21 MED ORDER — ATORVASTATIN CALCIUM 80 MG PO TABS: 80.0000 mg | ORAL_TABLET | Freq: Every day | ORAL | Status: DC

## 2015-10-21 MED ORDER — ALBUTEROL SULFATE HFA 108 (90 BASE) MCG/ACT IN AERS: 2.0000 | INHALATION_SPRAY | Freq: Four times a day (QID) | RESPIRATORY_TRACT | Status: DC | PRN

## 2015-10-21 MED ORDER — BUDESONIDE-FORMOTEROL FUMARATE 160-4.5 MCG/ACT IN AERO: 2.0000 | INHALATION_SPRAY | Freq: Two times a day (BID) | RESPIRATORY_TRACT | Status: DC

## 2015-10-21 MED ORDER — LOSARTAN POTASSIUM 25 MG PO TABS: 12.5000 mg | ORAL_TABLET | Freq: Every day | ORAL | Status: DC

## 2015-10-21 MED ORDER — TICAGRELOR 60 MG PO TABS: 60.0000 mg | ORAL_TABLET | Freq: Two times a day (BID) | ORAL | Status: DC

## 2015-10-21 MED ORDER — PANTOPRAZOLE SODIUM 20 MG PO TBEC: 20.0000 mg | DELAYED_RELEASE_TABLET | Freq: Every day | ORAL | Status: DC

## 2015-10-21 MED FILL — BISOPROLOL FUMARATE 5 MG TA: 5 | 30 days supply | Qty: 15 | Fill #0

## 2015-10-21 MED FILL — ATORVASTATIN 80 MG TABLET: 80 | 30 days supply | Qty: 30 | Fill #0

## 2015-10-21 MED FILL — ?BRILINTA 60 MG TABLET: 60 MG | 14 days supply | Qty: 28 | Fill #0

## 2015-10-21 MED FILL — SYMBICORT 160-4.5 MCG INH: 160-4.5 | 30 days supply | Qty: 10 | Fill #0

## 2015-10-21 MED FILL — LOSARTAN POTASSIUM 25 MG TA: 25 | 30 days supply | Qty: 15 | Fill #0

## 2015-10-21 MED FILL — PANTOPRAZOLE SOD DR 20 MG T: 20 | 30 days supply | Qty: 30 | Fill #0

## 2015-10-21 MED FILL — !VENTOLIN HFA INHALER: 108 (90 BAS | 28 days supply | Qty: 18 | Fill #0

## 2015-10-21 NOTE — Patient Instructions (Addendum)
-financial aid packet.   - Low-Sodium Eating Plan Sodium raises blood pressure and causes water to be held in the body. Getting less sodium from food will help lower your blood pressure, reduce any swelling, and protect your heart, liver, and kidneys. We get sodium by adding salt (sodium chloride) to food. Most of our sodium comes from canned, boxed, and frozen foods. Restaurant foods, fast foods, and pizza are also very high in sodium. Even if you take medicine to lower your blood pressure or to reduce fluid in your body, getting less sodium from your food is important. WHAT IS MY PLAN? Most people should limit their sodium intake to 2,300 mg a day. Your health care provider recommends that you limit your sodium intake to 2 grams  a day.  WHAT DO I NEED TO KNOW ABOUT THIS EATING PLAN? For the low-sodium eating plan, you will follow these general guidelines:  Choose foods with a % Daily Value for sodium of less than 5% (as listed on the food label).   Use salt-free seasonings or herbs instead of table salt or sea salt.   Check with your health care provider or pharmacist before using salt substitutes.   Eat fresh foods.  Eat more vegetables and fruits.  Limit canned vegetables. If you do use them, rinse them well to decrease the sodium.   Limit cheese to 1 oz (28 g) per day.   Eat lower-sodium products, often labeled as "lower sodium" or "no salt added."  Avoid foods that contain monosodium glutamate (MSG). MSG is sometimes added to [place] food and some canned foods.  Check food labels (Nutrition Facts labels) on foods to learn how much sodium is in one serving.  Eat more home-cooked food and less restaurant, buffet, and fast food.  When eating at a restaurant, ask that your food be prepared with less salt, or no salt if possible.  HOW DO I READ FOOD LABELS FOR SODIUM INFORMATION? The Nutrition Facts label lists the amount of sodium in one serving of the food. If you eat  more than one serving, you must multiply the listed amount of sodium by the number of servings. Food labels may also identify foods as:  Sodium free--Less than 5 mg in a serving.  Very low sodium--35 mg or less in a serving.  Low sodium--140 mg or less in a serving.  Light in sodium--50% less sodium in a serving. For example, if a food that usually has 300 mg of sodium is changed to become light in sodium, it will have 150 mg of sodium.  Reduced sodium--25% less sodium in a serving. For example, if a food that usually has 400 mg of sodium is changed to reduced sodium, it will have 300 mg of sodium. WHAT FOODS CAN I EAT? Grains Low-sodium cereals, including oats, puffed wheat and rice, and shredded wheat cereals. Low-sodium crackers. Unsalted rice and pasta. Lower-sodium bread.  Vegetables Frozen or fresh vegetables. Low-sodium or reduced-sodium canned vegetables. Low-sodium or reduced-sodium tomato sauce and paste. Low-sodium or reduced-sodium tomato and vegetable juices.  Fruits Fresh, frozen, and canned fruit. Fruit juice.  Meat and Other Protein Products Low-sodium canned tuna and salmon. Fresh or frozen meat, poultry, seafood, and fish. Lamb. Unsalted nuts. Dried beans, peas, and lentils without added salt. Unsalted canned beans. Homemade soups without salt. Eggs.  Dairy Milk. Soy milk. Ricotta cheese. Low-sodium or reduced-sodium cheeses. Yogurt.  Condiments Fresh and dried herbs and spices. Salt-free seasonings. Onion and garlic powders. Low-sodium varieties of  mustard and ketchup. Fresh or refrigerated horseradish. Lemon juice.  Fats and Oils Reduced-sodium salad dressings. Unsalted butter.  Other Unsalted popcorn and pretzels.  The items listed above may not be a complete list of recommended foods or beverages. Contact your dietitian for more options. WHAT FOODS ARE NOT RECOMMENDED? Grains Instant hot cereals. Bread stuffing, pancake, and biscuit mixes. Croutons.  Seasoned rice or pasta mixes. Noodle soup cups. Boxed or frozen macaroni and cheese. Self-rising flour. Regular salted crackers. Vegetables Regular canned vegetables. Regular canned tomato sauce and paste. Regular tomato and vegetable juices. Frozen vegetables in sauces. Salted [place] fries. Olives. Angie Fava. Relishes. Sauerkraut. Salsa. Meat and Other Protein Products Salted, canned, smoked, spiced, or pickled meats, seafood, or fish. Bacon, ham, sausage, hot dogs, corned beef, chipped beef, and packaged luncheon meats. Salt pork. Jerky. Pickled herring. Anchovies, regular canned tuna, and sardines. Salted nuts. Dairy Processed cheese and cheese spreads. Cheese curds. Blue cheese and cottage cheese. Buttermilk.  Condiments Onion and garlic salt, seasoned salt, table salt, and sea salt. Canned and packaged gravies. Worcestershire sauce. Tartar sauce. Barbecue sauce. Teriyaki sauce. Soy sauce, including reduced sodium. Steak sauce. Fish sauce. Oyster sauce. Cocktail sauce. Horseradish that you find on the shelf. Regular ketchup and mustard. Meat flavorings and tenderizers. Bouillon cubes. Hot sauce. Tabasco sauce. Marinades. Taco seasonings. Relishes. Fats and Oils Regular salad dressings. Salted butter. Margarine. Ghee. Bacon fat.  Other Potato and tortilla chips. Corn chips and puffs. Salted popcorn and pretzels. Canned or dried soups. Pizza. Frozen entrees and pot pies.  The items listed above may not be a complete list of foods and beverages to avoid. Contact your dietitian for more information.   This information is not intended to replace advice given to you by your health care provider. Make sure you discuss any questions you have with your health care provider.   Document Released: 10/10/2001 Document Revised: 05/11/2014 Document Reviewed: 02/22/2013 Elsevier Interactive Patient Education [employer].

## 2015-10-21 NOTE — Progress Notes (Signed)
Patient is here to Establish Care  Patient denies pain at this time.  Patient is requesting refills on medications.  Patient has taken Brilinta today and patient has not eaten.

## 2015-10-21 NOTE — Progress Notes (Signed)
Timothy [name], is a 60 y.o. male  [id number]  [id number]  DOB - Mar 04, 1956  CC:  Chief Complaint  Patient presents with  . Establish Care       HPI: Hayven Nachman is a 60 y.o. male here today to establish medical care, last seen in clinic 2015, w/ significant pmhx OOH cardiac arrest 2/15, sp craterization, w/ pci/stent placement to total RCA occulusion. .  Pt has since lost his financial aid w/ [facility] and has not reapplied. Doing ok, has not been on bp meds (losartan and bisoprolol for several months).  States every time takes, they make him fill "aweful", more tired than his baseline normal.   He is still taking Brilinta, but would like to apply for PASS program medication assistance.  Never had prior colonoscopy.  Denies tob for last 5-6 year, but hx of copd w/ 35 pack year hx prior.  Gets winded sometimes w/ exertion, but Symbicort helps. Ran out of rescue albuterol as well.   Denies current hematuria. Recent ED visit on 4/17 w/ urinary problems mentioned concern for bladder mass, recd f/u w/ Uro, but does not have money.  Patient has No headache, No chest pain, No abdominal pain - No Nausea, No new weakness tingling or numbness, No Cough - SOB.  Allergies  Allergen Reactions  . Aspirin Swelling    To lips.  . Ibuprofen Swelling    To lips.   Past Medical History  Diagnosis Date  . Hypokalemia, replaced 06/19/2013  . Hypomagnesemia, replaced 06/19/2013  . Cardiac arrest, hypothermic protocol 06/12/2013  . NSTEMI (non-ST elevated myocardial infarction), DES to RCA 06/13/2013  . COPD exacerbation ([place]) 06/12/2013  . Hyperlipidemia LDL goal < 70 06/19/2013  . Hypotension, requiring levophed 06/19/2013  . Bronchitis, chronic ([place]) 06/19/2013  . Tobacco abuse 06/19/2013  . Altered mental status, improved at discharge 06/19/2013   Current Outpatient Prescriptions on File Prior to Visit  Medication Sig Dispense Refill  . acetaminophen (TYLENOL) 325 MG tablet Take 2 tablets (650  mg total) by mouth every 4 (four) hours as needed for headache or mild pain.    Marland Kitchen esomeprazole (NEXIUM 24HR) 20 MG capsule Take 20 mg by mouth daily at 12 noon.    . nitroGLYCERIN (NITROSTAT) 0.4 MG SL tablet Place 1 tablet (0.4 mg total) under the tongue every 5 (five) minutes as needed for chest pain. 25 tablet 4   No current facility-administered medications on file prior to visit.   History reviewed. No pertinent family history. Social History   Social History  . Marital Status: Divorced    Spouse Name: N/A  . Number of Children: N/A  . Years of Education: N/A   Occupational History  . Not on file.   Social History Main Topics  . Smoking status: Former Smoker -- 2.00 packs/day for 38 years    Types: Cigarettes    Quit date: 07/10/2008  . Smokeless tobacco: Never Used  . Alcohol Use: No  . Drug Use: No  . Sexual Activity: Yes   Other Topics Concern  . Not on file   Social History Narrative    Review of Systems: Per hpi, o/w all systems reviewed and negative.   Objective:   Filed Vitals:   10/21/15 1032  BP: 128/75  Pulse: 71  Temp: 98.5 F (36.9 C)  Resp: 18    Filed Weights    BP Readings from Last 3 Encounters:  10/21/15 128/75  08/03/15 133/78  01/17/15 150/90  Physical Exam: Constitutional: Patient appears well-developed and well-nourished. No distress. AAOx3, thin appearing, pleasant. HENT: Normocephalic, atraumatic, External right and left ear normal. Oropharynx is clear and moist.  Eyes: Conjunctivae and EOM are normal. PERRL, no scleral icterus. Neck: Normal ROM. Neck supple. No JVD.  CVS: RRR, S1/S2 +, no murmurs, no gallops, no carotid bruit.  Pulmonary: Effort and breath sounds normal, no stridor, rhonchi, wheezes, rales.  Abdominal: Soft. BS +, no distension, tenderness, rebound or guarding.  Musculoskeletal: Normal range of motion. No edema and no tenderness.  LE: bilat/ no c/c/e, pulses 2+ bilateral. Neuro: Alert.  muscle tone  coordination. No cranial nerve deficit grossly. Skin: Skin is warm and dry. No rash noted. Not diaphoretic. No erythema. No pallor. Psychiatric: Normal mood and affect. Behavior, judgment, thought content normal.  Lab Results  Component Value Date   WBC 12.1* 08/02/2015   HGB 15.5 08/02/2015   HCT 46.0 08/02/2015   MCV 91.5 08/02/2015   PLT 289 08/02/2015   Lab Results  Component Value Date   CREATININE 0.92 08/02/2015   BUN 13 08/02/2015   NA 140 08/02/2015   K 4.2 08/02/2015   CL 104 08/02/2015   CO2 25 08/02/2015    Lab Results  Component Value Date   HGBA1C 6.2* 01/01/2014   Lipid Panel     Component Value Date/Time   CHOL 112* 01/21/2015 0857   TRIG 98 01/21/2015 0857   HDL 47 01/21/2015 0857   CHOLHDL 2.4 01/21/2015 0857   VLDL 20 01/21/2015 0857   LDLCALC 45 01/21/2015 0857       Depression screen PHQ 2/9 10/21/2015  Decreased Interest 0  Down, Depressed, Hopeless 0  PHQ - 2 Score 0  Altered sleeping 3  Change in appetite 0  PHQ-9 Score 3   Ct a/p 08/03/15 IMPRESSION: 1. No urinary tract calculi or hydronephrosis. 2. Hyper attenuation within the right side of the bladder. Equivocal hyper attenuation in the left bladder base. Although this could represent blood clot, solid bladder neoplasm cannot be excluded. Consider nonemergent urology consultation to allow eventual cystoscopy. In addition, non emergent dedicated hematuria protocol CT may be informative. 3. Tiny right lower lobe pulmonary nodule. Non-contrast chest CT can be considered in 12 months, given risk factor(s). This recommendation follows the consensus statement: Guidelines for Management of Incidental Pulmonary Nodules Detected on CT Images:From the Fleischner Society 2017; published online before print (10.1148/radiol.IJ:2314499). 4. Atherosclerosis, including within the coronary arteries.  Assessment and plan:   1. Essential hypertension Well controlled, despite not being on his  bp meds for several months now.  Given significant hx of cad, resumed bisoprolol 2.5 qd and losartan 12.5 qd trial - asked him to take, call me if severe sx next couple of weeks, f/u w/ me in 1 month for bp eval. - Lipid Panel - BASIC METABOLIC PANEL WITH GFR - prior hx of wheezing w/ ACEI.  2. COPD GOLD III Stable, renewed Symbicort and albuterol mdi  3. Coronary artery disease involving native coronary artery of native heart without angina pectoris - asa/statin - does ok on low dose asa, high /full dose gets oral swelliing per pt. - followed by cards as well.  4. Cardiac arrest, hypothermic protocol, vfib/vtach.  Resp arrest 06/2013  5. Lung nodule, 39mm rll on CT 4/17 - fu w/ CT recd in 1 year  6. Transient hematuria, resolved, but noted hyperattenuation of right side of bladder noted on CT - uro c/s placed  [place]  colonscopy, needs financial aid packet.  8. Financial assistance  Return in about 4 weeks (around 11/18/2015) for htn.  The patient was given clear instructions to go to ER or return to medical center if symptoms don't improve, worsen or new problems develop. The patient verbalized understanding. The patient was told to call to get lab results if they haven't heard anything in the next week.      Maren Reamer, MD, [place], [place]   10/21/2015, 10:59 AM

## 2015-10-24 ENCOUNTER — Telehealth: Payer: Self-pay | Admitting: *Deleted

## 2015-10-24 NOTE — Telephone Encounter (Signed)
Medical Assistant left message on patient's home and cell voicemail. Voicemail states to give a call back to Brodey Bonn with [facility] at [phone].  

## 2015-10-24 NOTE — Telephone Encounter (Signed)
-----   Message from Maren Reamer, MD sent at 10/22/2015  8:31 AM EDT ----- Please call pt and tell him hi cholesterol is off the wall, needs to pick up his atorvastatin 80mg  pills and take them. Kidney function remains good. thanks

## 2015-12-12 MED FILL — SYMBICORT 160-4.5 MCG INH: 160-4.5 | 30 days supply | Qty: 10 | Fill #1

## 2015-12-12 MED FILL — BISOPROLOL FUMARATE 5 MG TA: 5 | 30 days supply | Qty: 15 | Fill #1

## 2015-12-12 MED FILL — PANTOPRAZOLE SOD DR 20 MG T: 20 | 30 days supply | Qty: 30 | Fill #1

## 2015-12-13 ENCOUNTER — Ambulatory Visit: Payer: Medicaid Other | Attending: Internal Medicine

## 2016-01-15 MED FILL — PANTOPRAZOLE SOD DR 20 MG T: 20 | 30 days supply | Qty: 30 | Fill #2

## 2016-01-15 MED FILL — BISOPROLOL FUMARATE 5 MG TA: 5 | 30 days supply | Qty: 15 | Fill #2

## 2016-01-15 MED FILL — NITROSTAT 0.4 MG TABLET SL: 0.4 | 25 days supply | Qty: 25 | Fill #1

## 2016-01-15 MED FILL — LOSARTAN POTASSIUM 25 MG TA: 25 | 30 days supply | Qty: 15 | Fill #1

## 2016-01-15 MED FILL — SYMBICORT 160-4.5 MCG INH: 160-4.5 | 30 days supply | Qty: 10 | Fill #2

## 2016-02-03 ENCOUNTER — Other Ambulatory Visit: Payer: Self-pay | Admitting: Cardiovascular Disease

## 2016-02-03 MED FILL — NITROSTAT 0.4 MG TABLET SL: 0.4 | 25 days supply | Qty: 25 | Fill #0

## 2016-02-03 NOTE — Telephone Encounter (Signed)
REFILL 

## 2016-02-11 MED FILL — ATORVASTATIN 80 MG TABLET: 80 | 30 days supply | Qty: 30 | Fill #1

## 2016-02-11 MED FILL — SYMBICORT 160-4.5 MCG INH: 160-4.5 | 30 days supply | Qty: 10 | Fill #3

## 2016-02-11 MED FILL — BISOPROLOL FUMARATE 5 MG TA: 5 | 30 days supply | Qty: 15 | Fill #3

## 2016-02-11 MED FILL — PANTOPRAZOLE SOD DR 20 MG T: 20 | 30 days supply | Qty: 30 | Fill #3

## 2016-02-11 MED FILL — LOSARTAN POTASSIUM 25 MG TA: 25 | 30 days supply | Qty: 15 | Fill #2

## 2016-02-12 MED FILL — BRILINTA 60 MG TABLET: 60 | 30 days supply | Qty: 60 | Fill #1

## 2016-03-18 MED FILL — BISOPROLOL FUMARATE 5 MG TA: 5 | 30 days supply | Qty: 15 | Fill #4

## 2016-03-18 MED FILL — PANTOPRAZOLE SOD DR 20 MG T: 20 | 30 days supply | Qty: 30 | Fill #4

## 2016-03-20 ENCOUNTER — Telehealth: Payer: Self-pay | Admitting: Pharmacist

## 2016-03-20 MED ORDER — MOMETASONE FURO-FORMOTEROL FUM 100-5 MCG/ACT IN AERO: 2.0000 | INHALATION_SPRAY | Freq: Two times a day (BID) | RESPIRATORY_TRACT | 2 refills | Status: DC

## 2016-03-20 MED FILL — SYMBICORT 160-4.5 MCG INH: 160-4.5 | 30 days supply | Qty: 10 | Fill #5

## 2016-03-20 NOTE — Telephone Encounter (Signed)
Dulera ordered to bridge until patient can get Symbicort through PASS

## 2016-03-31 ENCOUNTER — Other Ambulatory Visit: Payer: Self-pay

## 2016-03-31 MED ORDER — BUDESONIDE-FORMOTEROL FUMARATE 160-4.5 MCG/ACT IN AERO: 2.0000 | INHALATION_SPRAY | Freq: Two times a day (BID) | RESPIRATORY_TRACT | 3 refills | Status: DC

## 2016-03-31 NOTE — Telephone Encounter (Signed)
Printed script for pass. 

## 2016-04-20 MED FILL — PANTOPRAZOLE SOD DR 20 MG T: 20 | 30 days supply | Qty: 30 | Fill #5

## 2016-04-20 MED FILL — BISOPROLOL FUMARATE 5 MG TA: 5 | 30 days supply | Qty: 15 | Fill #5

## 2016-05-04 DIAGNOSIS — C679 Malignant neoplasm of bladder, unspecified: Secondary | ICD-10-CM

## 2016-05-04 HISTORY — DX: Malignant neoplasm of bladder, unspecified: C67.9

## 2016-05-19 MED FILL — PANTOPRAZOLE SOD DR 20 MG T: 20 | 30 days supply | Qty: 30 | Fill #6

## 2016-05-19 MED FILL — BISOPROLOL FUMARATE 5 MG TA: 5 | 30 days supply | Qty: 15 | Fill #6

## 2016-10-07 MED FILL — !VENTOLIN HFA INHALER: 108 (90 BAS | 28 days supply | Qty: 18 | Fill #1

## 2016-10-23 ENCOUNTER — Ambulatory Visit: Payer: Medicare Other | Attending: Internal Medicine

## 2016-10-23 MED FILL — !SYMBICORT 160-4.5 MCG INH: 160-4.5 | 15 days supply | Qty: 1 | Fill #0

## 2016-11-10 ENCOUNTER — Other Ambulatory Visit: Payer: Self-pay | Admitting: *Deleted

## 2016-11-10 MED ORDER — FLUTICASONE FUROATE-VILANTEROL 200-25 MCG/INH IN AEPB: 1.0000 | INHALATION_SPRAY | Freq: Every day | RESPIRATORY_TRACT | 3 refills | Status: DC

## 2016-11-10 MED ORDER — ALBUTEROL SULFATE HFA 108 (90 BASE) MCG/ACT IN AERS: 2.0000 | INHALATION_SPRAY | Freq: Four times a day (QID) | RESPIRATORY_TRACT | 3 refills | Status: DC | PRN

## 2016-11-10 MED ORDER — TICAGRELOR 60 MG PO TABS: 60.0000 mg | ORAL_TABLET | Freq: Two times a day (BID) | ORAL | 3 refills | Status: DC

## 2016-11-10 MED FILL — **BREO ELLIPTA 200-25 MCG I: 200-25 MCG | 30 days supply | Qty: 60 | Fill #0

## 2016-11-10 NOTE — Telephone Encounter (Signed)
PRINTED FOR PASS PROGRAM 

## 2016-11-20 ENCOUNTER — Ambulatory Visit: Payer: Medicare Other | Attending: Internal Medicine | Admitting: Internal Medicine

## 2016-11-20 ENCOUNTER — Encounter: Payer: Self-pay | Admitting: Internal Medicine

## 2016-11-20 VITALS — BP 157/84 | HR 75 | Temp 98.1°F | Resp 16 | Wt 123.2 lb

## 2016-11-20 DIAGNOSIS — R911 Solitary pulmonary nodule: Secondary | ICD-10-CM | POA: Insufficient documentation

## 2016-11-20 DIAGNOSIS — I10 Essential (primary) hypertension: Secondary | ICD-10-CM | POA: Diagnosis not present

## 2016-11-20 DIAGNOSIS — K219 Gastro-esophageal reflux disease without esophagitis: Secondary | ICD-10-CM | POA: Insufficient documentation

## 2016-11-20 DIAGNOSIS — I25118 Atherosclerotic heart disease of native coronary artery with other forms of angina pectoris: Secondary | ICD-10-CM | POA: Diagnosis not present

## 2016-11-20 DIAGNOSIS — Z8674 Personal history of sudden cardiac arrest: Secondary | ICD-10-CM | POA: Insufficient documentation

## 2016-11-20 DIAGNOSIS — E785 Hyperlipidemia, unspecified: Secondary | ICD-10-CM | POA: Insufficient documentation

## 2016-11-20 DIAGNOSIS — Z87891 Personal history of nicotine dependence: Secondary | ICD-10-CM | POA: Insufficient documentation

## 2016-11-20 DIAGNOSIS — I472 Ventricular tachycardia: Secondary | ICD-10-CM | POA: Insufficient documentation

## 2016-11-20 DIAGNOSIS — I252 Old myocardial infarction: Secondary | ICD-10-CM | POA: Insufficient documentation

## 2016-11-20 DIAGNOSIS — J449 Chronic obstructive pulmonary disease, unspecified: Secondary | ICD-10-CM

## 2016-11-20 DIAGNOSIS — R310 Gross hematuria: Secondary | ICD-10-CM | POA: Insufficient documentation

## 2016-11-20 DIAGNOSIS — Z7982 Long term (current) use of aspirin: Secondary | ICD-10-CM | POA: Insufficient documentation

## 2016-11-20 DIAGNOSIS — Z7902 Long term (current) use of antithrombotics/antiplatelets: Secondary | ICD-10-CM | POA: Diagnosis not present

## 2016-11-20 DIAGNOSIS — R7303 Prediabetes: Secondary | ICD-10-CM | POA: Insufficient documentation

## 2016-11-20 DIAGNOSIS — Z955 Presence of coronary angioplasty implant and graft: Secondary | ICD-10-CM | POA: Insufficient documentation

## 2016-11-20 MED ORDER — PANTOPRAZOLE SODIUM 20 MG PO TBEC: 20.0000 mg | DELAYED_RELEASE_TABLET | Freq: Every day | ORAL | 2 refills | Status: DC

## 2016-11-20 MED ORDER — ATORVASTATIN CALCIUM 80 MG PO TABS: 40.0000 mg | ORAL_TABLET | Freq: Every day | ORAL | 6 refills | Status: DC

## 2016-11-20 MED ORDER — NITROGLYCERIN 0.4 MG SL SUBL: 0.4000 mg | SUBLINGUAL_TABLET | SUBLINGUAL | 0 refills | Status: DC | PRN

## 2016-11-20 MED ORDER — BISOPROLOL FUMARATE 5 MG PO TABS: 2.5000 mg | ORAL_TABLET | Freq: Every day | ORAL | 6 refills | Status: DC

## 2016-11-20 MED ORDER — ALBUTEROL SULFATE (2.5 MG/3ML) 0.083% IN NEBU: 2.5000 mg | INHALATION_SOLUTION | Freq: Four times a day (QID) | RESPIRATORY_TRACT | 1 refills | Status: DC | PRN

## 2016-11-20 MED FILL — NITROSTAT 0.4 MG TABLET SL: 0.4 | 25 days supply | Qty: 25 | Fill #0

## 2016-11-20 MED FILL — PANTOPRAZOLE SOD DR 20 MG T: 20 | 30 days supply | Qty: 30 | Fill #0

## 2016-11-20 MED FILL — BISOPROLOL FUMARATE 5 MG TA: 5 | 30 days supply | Qty: 15 | Fill #0

## 2016-11-20 MED FILL — ALBUTEROL 0.083% INHAL SOLN: (2.5 MG/3ML | 15 days supply | Qty: 180 | Fill #0

## 2016-11-20 MED FILL — ATORVASTATIN 80 MG TABLET: 80 | 30 days supply | Qty: 15 | Fill #0

## 2016-11-20 NOTE — Patient Instructions (Signed)
Take 1/2 tab of the 80 mg Lipitor. Take your blood pressure medication at nights.   Gastroesophageal Reflux Scan A gastroesophageal reflux scan is a procedure that is used to check for gastroesophageal reflux, which is the backward flow of stomach contents into the tube that carries food from the mouth to the stomach (esophagus). The scan can also show if any stomach contents are inhaled (aspirated) into your lungs. You may need this scan if you have symptoms such as heartburn, vomiting, swallowing problems, or regurgitation. Regurgitation means that swallowed food is returning from the stomach to the esophagus. For this scan, you will drink a liquid that contains a small amount of a radioactive substance (tracer). A scanner with a camera that detects the radioactive tracer is used to see if any of the material backs up into your esophagus. Tell a health care provider about:  Any allergies you have.  All medicines you are taking, including vitamins, herbs, eye drops, creams, and over-the-counter medicines.  Any blood disorders you have.  Any surgeries you have had.  Any medical conditions you have.  If you are pregnant or you think that you may be pregnant.  If you are breastfeeding. What are the risks? Generally, this is a safe procedure. However, problems may occur, including:  Exposure to radiation (a small amount).  Allergic reaction to the radioactive substance. This is rare.  What happens before the procedure?  Ask your health care provider about changing or stopping your regular medicines. This is especially important if you are taking diabetes medicines or blood thinners.  Follow your health care provider's instructions about eating or drinking restrictions. What happens during the procedure?  You will be asked to drink a liquid that contains a small amount of a radioactive tracer. This liquid will probably be similar to orange juice.  You will assume a position lying on  your back.  A series of images will be taken of your esophagus and upper stomach.  You may be asked to move into different positions to help determine if reflux occurs more often when you are in specific positions.  For adults, an abdominal binder with an inflatable cuff may be placed on the belly (abdomen). This may be used to increase abdominal pressure. More images will be taken to see if the increased pressure causes reflux to occur. The procedure may vary among health care providers and hospitals. What happens after the procedure?  Return to your normal activities and your normal diet as directed by your health care provider.  The radioactive tracer will leave your body over the next few days. Drink enough fluid to keep your urine clear or pale yellow. This will help to flush the tracer out of your body.  It is your responsibility to obtain your test results. Ask your health care provider or the department performing the test when and how you will get your results. This information is not intended to replace advice given to you by your health care provider. Make sure you discuss any questions you have with your health care provider. Document Released: 06/11/2005 Document Revised: 01/13/2016 Document Reviewed: 01/30/2014 Elsevier Interactive Patient Education  [employer].

## 2016-11-20 NOTE — Progress Notes (Signed)
Patient ID: Timothy [name], male    DOB: Oct 29, 1955  MRN: [id number]  CC: re-establish and Hypertension   Subjective: Timothy [name] is a 61 y.o. male who presents for chronic ds management.  Last seen 08/2015 His concerns today include:  61 yr old with hx of HTN, cardiac arrest vfib/vtach 06/2013, CAD with stent to RCA, COPD Gold stage III, former smoker, pre-DM, lung nodule on CT 08/2015 and hematuria.  1. Hematuria with abnormal finding in bladder on CT 2017: -referred to urologist on last visit but did not go due to lack of insurnace -endorses intermittent hematuria since last visit. Last episode was 2 wks ago for 3 days. No dysuria  2. RT lung nodule on CT 08/2015.  Was due for repeat in 1 yr  -quit smoking 8 yrs ago -no cough or hemoptysis  3. COPD -+SOB "when I do anything especially going up hill."  No cough -was on Symbicort x 3 yrs.  Given Breo to use after he runs out of Symbicort due to cost. Has Medicare A&B, no part D. -uses Albuterol every day when he has a flare.  On avg using 4-5 x a wk -has a neb machine at home. No treatments.  4. CAD/HTN:  -occasional chest pain lasting a few seconds.  Not had to use SL Nitro -compliant with Brilinta but thinks he will not be able to afford any more.  -not taking Lipitor because has problems swallowing the large tablet -not taking Zebeta - "makes me tired." Did not tolerate Toprol in past; "It made my breathing bad." Losartan causes cramps in feet/calf so he discontinue taking that also. Did not tolerate ACE in past  5.  Request RF Protonix -taking Ranitidine 2-3 x a day but does not work well -not avoiding foods that cause gerd for him  HM: declines colonoscopy, hep C/HIV  Patient Active Problem List   Diagnosis Date Noted  . Old MI (myocardial infarction) 01/19/2015  . COPD exacerbation ([place]) 01/01/2014  . Prediabetes 12/29/2013  . Dental cavities 12/29/2013  . SOB (shortness of breath) 08/30/2013  . HTN (hypertension)  08/03/2013  . CAD (coronary artery disease) 06/19/2013  . Hypokalemia, replaced 06/19/2013  . Hypomagnesemia, replaced 06/19/2013  . Hyperlipidemia with target LDL less than 70 06/19/2013  . Hypotension, requiring levophed 06/19/2013  . Bronchitis, chronic ([place]) 06/19/2013  . Tobacco abuse 06/19/2013  . Altered mental status, improved at discharge 06/19/2013  . NSTEMI (non-ST elevated myocardial infarction), DES to RCA 06/13/2013  . Cardiac arrest, hypothermic protocol, vfib/vtach.  Resp arrest 06/12/2013  . VT (ventricular tachycardia), requiring 2 shocks 06/12/2013  . COPD GOLD III 06/12/2013     Current Outpatient Prescriptions on File Prior to Visit  Medication Sig Dispense Refill  . acetaminophen (TYLENOL) 325 MG tablet Take 2 tablets (650 mg total) by mouth every 4 (four) hours as needed for headache or mild pain.    Marland Kitchen albuterol (VENTOLIN HFA) 108 (90 Base) MCG/ACT inhaler Inhale 2 puffs into the lungs every 6 (six) hours as needed for wheezing or shortness of breath. 54 g 3  . aspirin EC 81 MG tablet Take 1 tablet (81 mg total) by mouth daily. 90 tablet 2  . fluticasone furoate-vilanterol (BREO ELLIPTA) 200-25 MCG/INH AEPB Inhale 1 puff into the lungs daily. 180 each 3   No current facility-administered medications on file prior to visit.     Allergies  Allergen Reactions  . Ace Inhibitors     Wheezing, prior notes  .  Aspirin Swelling    To lips.  . Ibuprofen Swelling    To lips.    Social History   Social History  . Marital status: Divorced    Spouse name: N/A  . Number of children: N/A  . Years of education: N/A   Occupational History  . Not on file.   Social History Main Topics  . Smoking status: Former Smoker    Packs/day: 2.00    Years: 38.00    Types: Cigarettes    Quit date: 07/10/2008  . Smokeless tobacco: Never Used  . Alcohol use No  . Drug use: No  . Sexual activity: Yes   Other Topics Concern  . Not on file   Social History Narrative  . No  narrative on file    No family history on file.  Past Surgical History:  Procedure Laterality Date  . cardiac sstent    . CORONARY ANGIOPLASTY WITH STENT PLACEMENT  06/12/13   [facility] DES to RCA, NSTEMI  . LEFT HEART CATHETERIZATION WITH CORONARY ANGIOGRAM N/A 06/12/2013   Procedure: LEFT HEART CATHETERIZATION WITH CORONARY ANGIOGRAM;  Surgeon: Troy Sine, MD;  [facility] C[facility] LAB;  Service: Cardiovascular;  Laterality: N/A;  . NO PAST SURGERIES      ROS: Review of Systems negative except as stated above   PHYSICAL EXAM: BP (!) 157/84   Pulse 75   Temp 98.1 F (36.7 C) (Oral)   Resp 16   Wt 123 lb 3.2 oz (55.9 kg)   SpO2 95%   BMI 19.89 kg/m   150/80 Physical Exam General appearance - alert, well appearing, middle age to older caucasian male and in no distress Mental status - alert, oriented to person, place, and time, normal mood, behavior, speech, dress, motor activity, and thought processes Mouth - mucous membranes moist, pharynx normal without lesions. LT upper 2nd molar broken off in gum Neck - supple, no significant adenopathy Chest - clear to auscultation, no wheezes, rales or rhonchi, symmetric air entry Heart - normal rate, regular rhythm, normal S1, S2, no murmurs, rubs, clicks or gallops Extremities - peripheral pulses normal, no pedal edema, no clubbing or cyanosis  Depression screen PHQ 2/9 11/20/2016  Decreased Interest 1  Down, Depressed, Hopeless 0  PHQ - 2 Score 1  Altered sleeping -  Change in appetite -  PHQ-9 Score -   GAD 7 : Generalized Anxiety Score 11/20/2016  Nervous, Anxious, on Edge 0  Control/stop worrying 0  Worry too much - different things 0  Trouble relaxing 0  Restless 0  Easily annoyed or irritable 0  Afraid - awful might happen 0  Total GAD 7 Score 0    ASSESSMENT AND PLAN: 1. COPD GOLD III -Symbicort changed to Breo. Dulera taken off list as he does not have this - albuterol (PROVENTIL) (2.5 MG/3ML) 0.083% nebulizer  solution; Take 3 mLs (2.5 mg total) by nebulization every 6 (six) hours as needed for wheezing or shortness of breath.  Dispense: 150 mL; Refill: 1  2. Coronary artery disease of native artery of native heart with stable angina pectoris ([place]) -advised to break Lipitor 80 mg pill in half and try taking at least 40 mg.  Take the Zebeta at bedtime since it makes him too tired during the day if he takes in mornings. -change Brilinta to Plavix once he runs out since he will no longer be able to afford -arrange f/u with cardiology since he has not been seen in over 1  yr - CBC - Comprehensive metabolic panel - Lipid panel - atorvastatin (LIPITOR) 80 MG tablet; Take 0.5 tablets (40 mg total) by mouth daily.  Dispense: 30 tablet; Refill: 6 - bisoprolol (ZEBETA) 5 MG tablet; Take 0.5 tablets (2.5 mg total) by mouth at bedtime.  Dispense: 45 tablet; Refill: 6 - nitroGLYCERIN (NITROSTAT) 0.4 MG SL tablet; Place 1 tablet (0.4 mg total) under the tongue every 5 (five) minutes as needed for chest pain. NEED OV.  Dispense: 25 tablet; Refill: 0  3. Essential hypertension -not at goal.  Pt to restart Zebeta.  DASH diet discussed - bisoprolol (ZEBETA) 5 MG tablet; Take 0.5 tablets (2.5 mg total) by mouth at bedtime.  Dispense: 45 tablet; Refill: 6  4. Gross hematuria - Ambulatory referral to Urology  5. Lung nodule -needs repeat CT scan for f/u RLL nodule  6. Gastroesophageal reflux disease without esophagitis  - pantoprazole (PROTONIX) 20 MG tablet; Take 1 tablet (20 mg total) by mouth daily.  Dispense: 90 tablet; Refill: 2  7. Pre-diabetes - Hemoglobin A1c  8. Lung nodule, solitary - CT CHEST NODULE FOLLOW UP LOW DOSE W/O; Future -pt informed that when I entered order, Medicare waiver showing that he will be charged for study based based on dx entered.  Pt expressed understanding and agreed to move forward.   Patient was given the opportunity to ask questions.  Patient verbalized understanding of  the plan and was able to repeat key elements of the plan.   Orders Placed This Encounter  Procedures  . CT CHEST NODULE FOLLOW UP LOW DOSE W/O  . CBC  . Comprehensive metabolic panel  . Lipid panel  . Hemoglobin A1c  . Ambulatory referral to Urology     Requested Prescriptions   Signed Prescriptions Disp Refills  . atorvastatin (LIPITOR) 80 MG tablet 30 tablet 6    Sig: Take 0.5 tablets (40 mg total) by mouth daily.  . bisoprolol (ZEBETA) 5 MG tablet 45 tablet 6    Sig: Take 0.5 tablets (2.5 mg total) by mouth at bedtime.  . nitroGLYCERIN (NITROSTAT) 0.4 MG SL tablet 25 tablet 0    Sig: Place 1 tablet (0.4 mg total) under the tongue every 5 (five) minutes as needed for chest pain. NEED OV.  . pantoprazole (PROTONIX) 20 MG tablet 90 tablet 2    Sig: Take 1 tablet (20 mg total) by mouth daily.  Marland Kitchen albuterol (PROVENTIL) (2.5 MG/3ML) 0.083% nebulizer solution 150 mL 1    Sig: Take 3 mLs (2.5 mg total) by nebulization every 6 (six) hours as needed for wheezing or shortness of breath.    Return in about 3 months (around 02/20/2017).  Karle Plumber, MD, FACP

## 2016-11-21 LAB — COMPREHENSIVE METABOLIC PANEL
ALT: 13 IU/L (ref 0–44)
AST: 16 IU/L (ref 0–40)
Albumin/Globulin Ratio: 2 (ref 1.2–2.2)
Albumin: 5 g/dL (ref 3.5–5.5)
Alkaline Phosphatase: 93 IU/L (ref 39–117)
BUN/Creatinine Ratio: 14 (ref 9–20)
BUN: 14 mg/dL (ref 6–24)
Bilirubin Total: 0.4 mg/dL (ref 0.0–1.2)
CHLORIDE: 99 mmol/L (ref 96–106)
CO2: 23 mmol/L (ref 20–29)
Calcium: 10.2 mg/dL (ref 8.7–10.2)
Creatinine, Ser: 0.98 mg/dL (ref 0.76–1.27)
GFR, EST AFRICAN AMERICAN: 97 mL/min/{1.73_m2} (ref >59)
GFR, EST NON AFRICAN AMERICAN: 84 mL/min/{1.73_m2} (ref >59)
GLUCOSE: 94 mg/dL (ref 65–99)
Globulin, Total: 2.5 g/dL (ref 1.5–4.5)
Potassium: 4.9 mmol/L (ref 3.5–5.2)
Sodium: 140 mmol/L (ref 134–144)
TOTAL PROTEIN: 7.5 g/dL (ref 6.0–8.5)

## 2016-11-21 LAB — CBC
Hematocrit: 50.3 % (ref 37.5–51.0)
Hemoglobin: 17.3 g/dL (ref 13.0–17.7)
MCH: 30.8 pg (ref 26.6–33.0)
MCHC: 34.4 g/dL (ref 31.5–35.7)
MCV: 90 fL (ref 79–97)
PLATELETS: 353 10*3/uL (ref 150–379)
RBC: 5.62 x10E6/uL (ref 4.14–5.80)
RDW: 13.8 % (ref 12.3–15.4)
WBC: 12 10*3/uL — ABNORMAL HIGH (ref 3.4–10.8)

## 2016-11-21 LAB — LIPID PANEL
CHOL/HDL RATIO: 4.6 ratio (ref 0.0–5.0)
Cholesterol, Total: 232 mg/dL — ABNORMAL HIGH (ref 100–199)
HDL: 50 mg/dL (ref >39)
LDL Calculated: 153 mg/dL — ABNORMAL HIGH (ref 0–99)
TRIGLYCERIDES: 146 mg/dL (ref 0–149)
VLDL CHOLESTEROL CAL: 29 mg/dL (ref 5–40)

## 2016-11-21 LAB — HEMOGLOBIN A1C
Est. average glucose Bld gHb Est-mCnc: 114 mg/dL
HEMOGLOBIN A1C: 5.6 % (ref 4.8–5.6)

## 2016-11-25 ENCOUNTER — Ambulatory Visit (HOSPITAL_COMMUNITY)
Admission: RE | Admit: 2016-11-25 | Discharge: 2016-11-25 | Disposition: A | Discharge status: Home / Self Care | Payer: Medicare Other | Source: Ambulatory Visit | Attending: Internal Medicine | Admitting: Internal Medicine

## 2016-11-25 ENCOUNTER — Encounter (HOSPITAL_COMMUNITY): Payer: Self-pay

## 2016-11-25 DIAGNOSIS — I2510 Atherosclerotic heart disease of native coronary artery without angina pectoris: Secondary | ICD-10-CM | POA: Insufficient documentation

## 2016-11-25 DIAGNOSIS — I700 Atherosclerosis of aorta: Secondary | ICD-10-CM | POA: Insufficient documentation

## 2016-11-25 DIAGNOSIS — J439 Emphysema, unspecified: Secondary | ICD-10-CM | POA: Diagnosis not present

## 2016-11-25 DIAGNOSIS — R911 Solitary pulmonary nodule: Secondary | ICD-10-CM | POA: Insufficient documentation

## 2016-12-07 ENCOUNTER — Telehealth: Payer: Self-pay

## 2016-12-07 NOTE — Telephone Encounter (Signed)
Contacted pt to go over CT results. Pt didn't answer lvm asking pt to give me a call at his earliest convenience   If pt calls back please give results: that nodule in his RT lung is stable and unchanged from 1 yr ago.

## 2016-12-07 NOTE — Telephone Encounter (Signed)
Contacted pt to go over lab results pt didn't answer lvm asking pt to give me a call at his earliest convenience  If pt calls back please give results: he has a mild stable elevation in his white blood cell count. We will monitor this. Kidney and liver function tests are normal. Cholesterol is elevated. He should take Lipitor as discussed on recent visit to help prevent another heart attack. If he wanting to know the cost of the CT that he had done he will ned to call medicare.

## 2017-01-21 DIAGNOSIS — R310 Gross hematuria: Secondary | ICD-10-CM | POA: Diagnosis not present

## 2017-01-21 DIAGNOSIS — C678 Malignant neoplasm of overlapping sites of bladder: Secondary | ICD-10-CM | POA: Diagnosis not present

## 2017-01-29 ENCOUNTER — Telehealth: Payer: Self-pay | Admitting: Cardiovascular Disease

## 2017-01-29 NOTE — Telephone Encounter (Signed)
Patient has an appointment on 10/11 with Dr. Claiborne Billings. Will route to the provider.

## 2017-01-29 NOTE — Telephone Encounter (Signed)
New message        [facility] Pre-operative Risk Assessment    Request for surgical clearance:  1. What type of surgery is being performed?  Trans uretherial resection of bladder tumor and intravesical chemo  2. When is this surgery scheduled?  Not scheduled yet   3. Are there any medications that need to be held prior to surgery and how long? Brillinta 2 days prior  4. Name of physician performing surgery?  Dr Consuella Lose   5. What is your office phone and fax number?  [phone] x [phone] fax [phone]  6. Anesthesia type (None, local, MAC, general) ? general   Howie Ill 01/29/2017, 1:58 PM  _________________________________________________________________   (provider comments below)

## 2017-02-02 NOTE — Telephone Encounter (Signed)
Will review with patient at time of office visit

## 2017-02-11 ENCOUNTER — Ambulatory Visit (INDEPENDENT_AMBULATORY_CARE_PROVIDER_SITE_OTHER): Payer: Medicare Other | Admitting: Cardiovascular Disease

## 2017-02-11 ENCOUNTER — Encounter: Payer: Self-pay | Admitting: Cardiovascular Disease

## 2017-02-11 VITALS — BP 132/74 | HR 84 | Ht 66.0 in | Wt 120.8 lb

## 2017-02-11 DIAGNOSIS — Z79899 Other long term (current) drug therapy: Secondary | ICD-10-CM

## 2017-02-11 DIAGNOSIS — Z01810 Encounter for preprocedural cardiovascular examination: Secondary | ICD-10-CM | POA: Diagnosis not present

## 2017-02-11 DIAGNOSIS — R0609 Other forms of dyspnea: Secondary | ICD-10-CM

## 2017-02-11 DIAGNOSIS — E785 Hyperlipidemia, unspecified: Secondary | ICD-10-CM

## 2017-02-11 DIAGNOSIS — I252 Old myocardial infarction: Secondary | ICD-10-CM

## 2017-02-11 DIAGNOSIS — J45909 Unspecified asthma, uncomplicated: Secondary | ICD-10-CM

## 2017-02-11 DIAGNOSIS — I2510 Atherosclerotic heart disease of native coronary artery without angina pectoris: Secondary | ICD-10-CM | POA: Diagnosis not present

## 2017-02-11 NOTE — Patient Instructions (Signed)
Medication Instructions:  Your physician recommends that you continue on your current medications as directed. Please refer to the Current Medication list given to you today.  Labwork: Please return for FASTING labs (CMET, Lipid, TSH)  Our in office lab hours are Monday-Friday 8:00-4:30, closed for lunch 1-2 pm.  No appointment needed.  Testing/Procedures: Your physician has requested that you have en exercise stress myoview. For further information please visit [email or web address]. Please follow instruction sheet, as given.  Your physician has requested that you have an echocardiogram. Echocardiography is a painless test that uses sound waves to create images of your heart. It provides your doctor with information about the size and shape of your heart and how well your heart's chambers and valves are working. This procedure takes approximately one hour. There are no restrictions for this procedure. This will be done at our [facility] location:  [place]: Your physician recommends that you schedule a follow-up appointment in: 1 month with Dr. Claiborne Billings   Any Other Special Instructions Will Be Listed Below (If Applicable).     If you need a refill on your cardiac medications before your next appointment, please call your pharmacy.

## 2017-02-11 NOTE — Progress Notes (Signed)
Patient ID: Timothy [name], male   DOB: 1955/05/16, 61 y.o.   MRN: [id number]    HPI: Timothy [name] is a 61 y.o. male presents for a 25 month follow-up cardiology evaluation and for preoperative clearance.    Timothy [name] presented to [facility] in the early morning of 06/12/2013 after being found down at home. When EMS arrived he was in ventricular fibrillation. He was transported acutely to [facility]  cardiac catheterization laboratory and hypothermia protocol was implemented. Emergent catheterization by me revealed total occlusion of the RCA and he underwent successful intervention with ultimate insertion of a 2.5x18 mm DES post dilated to 2.75 mm with the percent occlusion reduced to 0% and with resumption of brisk TIMI 3 flow. He ultimately awoke and was intact neurologically after the hypothermia protocol completed his course. He did require levophed initially for hypotension. He was discharged on 06/19/2013.    He was sent home on aspirin and brilinta for dual anti-platelet therapy, metoprolol 75 mg twice a day lisinopril 2.5 mg, Crestor 20 mg. 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 At that time, I further titrate his metoprolol tartrate to 100 mg twice a day. I recommended laboratory be checked in the fasting state. 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  Timothy [name] never did undergo his studies. He stated that he was unable to afford the cost since his insurance has not yet become effective.  When I last saw him, did run out of his early the and I re-instituted therapy.  He also was on an ACE inhibitor and had significant wheezing and I recommended discontinuance of the ACE inhibitor.  He was ultimately started on losartan 25 mg and bisoprolol 5 mg and tolerating this well from a pulmonary perspective.  He was on Symbicort 160/4.5 and when necessary Ventolin without recent wheezing.  He underwent  a echo Doppler study on 11/06/2013 which was essentially normal; Ejection fraction 55-60% and he did not have any regional wall motion abnormality.  PA pressure was minimally elevated at 32 mm.  I have not seen him since September 2016.  In 2016.  His LDL was 45 on Crestor.  Over the past several years, he apparently stopped taking Crestor due to cost.  He also had self wean Brilinta and was taking 30 mg twice a day until recently and has not taken any for the last several weeks.  He continues to be on aspirin 81 mg and bisoprolol 2.5 mg at bedtime.  Apparently in July he had follow-up laboratory and his LDL cholesterol had risen to 153.  He was started on atorvastatin 40 mg, which he has been tolerating.  He also is on breo ellipta and takes albuterol as needed.  He takes pantoprazole for GERD.  He has been recently evaluated by Dr. Karsten Ro and will require transurethral resection of bladder tumor and intravesical chemotherapy.  His surgery is not scheduled.  He is now referred for preoperative clearance.  He admits to rare to occasional episodes of chest pain which often are nonexertional.  He also admits to occasional shortness of breath with activity.   Past Medical History:  Diagnosis Date  . Altered mental status, improved at discharge 06/19/2013  . Bronchitis, chronic ([place]) 06/19/2013  . Cardiac arrest, hypothermic protocol 06/12/2013  . COPD exacerbation ([place]) 06/12/2013  . Hyperlipidemia LDL goal < 70 06/19/2013  .  Hypokalemia, replaced 06/19/2013  . Hypomagnesemia, replaced 06/19/2013  . Hypotension, requiring levophed 06/19/2013  . NSTEMI (non-ST elevated myocardial infarction), DES to RCA 06/13/2013  . Tobacco abuse 06/19/2013    Past Surgical History:  Procedure Laterality Date  . cardiac sstent    . CORONARY ANGIOPLASTY WITH STENT PLACEMENT  06/12/13   Xience Alpine DES to RCA, NSTEMI  . LEFT HEART CATHETERIZATION WITH CORONARY ANGIOGRAM N/A 06/12/2013   Procedure: LEFT HEART CATHETERIZATION  WITH CORONARY ANGIOGRAM;  Surgeon: Troy Sine, MD;  Location: [facility] CATH LAB;  Service: Cardiovascular;  Laterality: N/A;  . NO PAST SURGERIES      Allergies  Allergen Reactions  . Ace Inhibitors     Wheezing, prior notes  . Aspirin Swelling    To lips.  . Ibuprofen Swelling    To lips.    Current Outpatient Prescriptions  Medication Sig Dispense Refill  . acetaminophen (TYLENOL) 325 MG tablet Take 2 tablets (650 mg total) by mouth every 4 (four) hours as needed for headache or mild pain.    Marland Kitchen albuterol (PROVENTIL) (2.5 MG/3ML) 0.083% nebulizer solution Take 3 mLs (2.5 mg total) by nebulization every 6 (six) hours as needed for wheezing or shortness of breath. 150 mL 1  . albuterol (VENTOLIN HFA) 108 (90 Base) MCG/ACT inhaler Inhale 2 puffs into the lungs every 6 (six) hours as needed for wheezing or shortness of breath. 54 g 3  . aspirin EC 81 MG tablet Take 1 tablet (81 mg total) by mouth daily. 90 tablet 2  . atorvastatin (LIPITOR) 80 MG tablet Take 0.5 tablets (40 mg total) by mouth daily. 30 tablet 6  . bisoprolol (ZEBETA) 5 MG tablet Take 0.5 tablets (2.5 mg total) by mouth at bedtime. 45 tablet 6  . fluticasone furoate-vilanterol (BREO ELLIPTA) 200-25 MCG/INH AEPB Inhale 1 puff into the lungs daily. 180 each 3  . nitroGLYCERIN (NITROSTAT) 0.4 MG SL tablet Place 1 tablet (0.4 mg total) under the tongue every 5 (five) minutes as needed for chest pain. NEED OV. 25 tablet 0  . pantoprazole (PROTONIX) 20 MG tablet Take 1 tablet (20 mg total) by mouth daily. 90 tablet 2   No current facility-administered medications for this visit.     Social History   Social History  . Marital status: Divorced    Spouse name: N/A  . Number of children: N/A  . Years of education: N/A   Occupational History  . Not on file.   Social History Main Topics  . Smoking status: Former Smoker    Packs/day: 2.00    Years: 38.00    Types: Cigarettes    Quit date: 07/10/2008  . Smokeless tobacco:  Never Used  . Alcohol use No  . Drug use: No  . Sexual activity: Yes   Other Topics Concern  . Not on file   Social History Narrative  . No narrative on file   Socially, he has a previous 30 year history of smoking, but quit smoking 7 years ago.  He previously worked as a as a Dealer but no longer works and is on disability.  No family history on file.   ROS General: Negative; No fevers, chills, or night sweats;  HEENT: Negative; No changes in vision or hearing, sinus congestion, difficulty swallowing Pulmonary: Positive for occasional wheezing. Cardiovascular: See history of present illness GI: Negative; No nausea, vomiting, diarrhea, or abdominal pain GU: Negative; No dysuria, hematuria, or difficulty voiding Musculoskeletal: Negative; no myalgias, joint pain, or weakness  Hematologic/Oncology: Negative; no easy bruising, bleeding Endocrine: Negative; no heat/cold intolerance; no diabetes Neuro: Negative; no changes in balance, headaches Skin: Negative; No rashes or skin lesions Psychiatric: Negative; No behavioral problems, depression Sleep: Negative; No snoring, daytime sleepiness, hypersomnolence, bruxism, restless legs, hypnogognic hallucinations, no cataplexy Other comprehensive 14 point system review is negative.   PE BP 132/74   Pulse 84   Ht 5' 6" (1.676 m)   Wt 120 lb 12.8 oz (54.8 kg)   BMI 19.50 kg/m    Repeat blood pressure by me was 130/70  Wt Readings from Last 3 Encounters:  02/11/17 120 lb 12.8 oz (54.8 kg)  11/20/16 123 lb 3.2 oz (55.9 kg)  08/02/15 132 lb 2 oz (59.9 kg)   General: Alert, oriented, no distress.  Skin: normal turgor, no rashes, warm and dry HEENT: Normocephalic, atraumatic. Pupils equal round and reactive to light; sclera anicteric; extraocular muscles intact;  Nose without nasal septal hypertrophy Mouth/Parynx benign; Mallinpatti scale 3 Neck: No JVD, no carotid bruits; normal carotid upstroke Lungs: clear to ausculatation and  percussion; no wheezing or rales Chest wall: without tenderness to palpitation Heart: PMI not displaced, RRR, s1 s2 normal, 1/6 systolic murmur, no diastolic murmur, no rubs, gallops, thrills, or heaves Abdomen: soft, nontender; no hepatosplenomehaly, BS+; abdominal aorta nontender and not dilated by palpation. Back: no CVA tenderness Pulses 2+ Musculoskeletal: full range of motion, normal strength, no joint deformities Extremities: no clubbing cyanosis or edema, Homan's sign negative  Neurologic: grossly nonfocal; Cranial nerves grossly wnl Psychologic: Normal mood and affect  ECG (independently read by me): Normal sinus rhythm at 84 bpm.  No evidence for prior MI.  Normal intervals.  No ST segment changes.  September 2016 ECG (independently read by me): Normal sinus rhythm at 68 bpm.  Normal ECG.  August 2015 ECG (independently read by me): Normal sinus rhythm at 69 beats per minute.  Normal intervals.  Normal EKG   Prior 09/04/2013 ECG (independently read by me): Normal sinus rhythm at 69 beats per minute.  A small Q wave in aVL.  Otherwise, no ECG evidence for recent myocardial infarction  Prior ECG (independently read by me) normal sinus rhythm at 72 beats per minute. QS in V1 and V2.  Prior 07/10/2013 ECG (independently read by me): Normal sinus rhythm 74 beats per minute. Normal intervals. No ECG evidence of his recent myocardial infarction  LABS: BMP Latest Ref Rng & Units 11/20/2016 10/21/2015 08/02/2015  Glucose 65 - 99 mg/dL 94 104(H) 114(H)  BUN 6 - 24 mg/dL _0 Creatinine 0.76 - 1.27 mg/dL 0.98 0.74 0.92  BUN/Creat Ratio 9 - 20 14 - -  Sodium 134 - 144 mmol/L 140 140 140  Potassium 3.5 - 5.2 mmol/L 4.9 4.0 4.2  Chloride 96 - 106 mmol/L 99 106 104  CO2 20 - 29 mmol/L _1 Calcium 8.7 - 10.2 mg/dL 10.2 9.4 9.9   Hepatic Function Latest Ref Rng & Units 11/20/2016 08/02/2015 01/21/2015  Total Protein 6.0 - 8.5 g/dL 7.5 7.0 6.8  Albumin 3.5 - 5.5 g/dL 5.0 4.1 4.3    AST 0 - 40 IU/L _2 ALT 0 - 44 IU/L 13 15(L) 16  Alk Phosphatase 39 - 117 IU/L 93 69 70  Total Bilirubin 0.0 - 1.2 mg/dL 0.4 0.5 0.6   CBC Latest Ref Rng & Units 11/20/2016 08/02/2015 01/21/2015  WBC 3.4 - 10.8 x10E3/uL 12.0(H) 12.1(H) 11.6(H)  Hemoglobin 13.0 - 17.7 g/dL 17.3 15.5  16.2  Hematocrit 37.5 - 51.0 % 50.3 46.0 46.6  Platelets 150 - 379 x10E3/uL 353 289 267   Lab Results  Component Value Date   MCV 90 11/20/2016   MCV 91.5 08/02/2015   MCV 90.3 01/21/2015   Lab Results  Component Value Date   TSH 0.971 01/21/2015    Lipid Panel     Component Value Date/Time   CHOL 232 (H) 11/20/2016 0953   TRIG 146 11/20/2016 0953   HDL 50 11/20/2016 0953   CHOLHDL 4.6 11/20/2016 0953   CHOLHDL 4.8 10/21/2015 1056   VLDL 38 (H) 10/21/2015 1056   LDLCALC 153 (H) 11/20/2016 0953      RADIOLOGY: No results found.  IMPRESSION:  No diagnosis found.  ASSESSMENT AND PLAN: Timothy [name] is a 61 year old white male who suffered an out of hospital cardiac arrest on 06/12/2013 and was found to be in VF and required successful defibrillation and CPR institution prior to arrival to the hospital. I took him acutely to the catheterization laboratory and he was found to have total occlusion of the mid RCA which was successfully intervened upon with restoration of TIMI-3 flow. Initial ejection fraction was 50-55% with mild inferior hypocontractility.  He also has a history of COPD/asthma and had previous significant hyperlipidemia.  I have not seen him in over 2 years.  And at that time, he was still on high potency lipid-lowering therapy, and also was on aspirin and Brilinta.  Apparently over the past several years.  He has stopped his lipid-lowering therapy and recently completely stopped antiplatelet therapy with the exception of aspirin.  He denies any clear-cut exertional chest pain symptomatology, but has noticed rare episodes of chest discomfort which are not typically exertional.   He also admits to shortness of breath with activity.  Lipid studies in July 2018 showed marked hyperlipidemia with his LDL cholesterol now at 153.  He was started back on lipid-lowering therapy with atorvastatin 40 mg and has been taking this for approximately 6-8 weeks.  I am recommending a follow-up lipid panel, chemistry profile, and TSH.  I'm scheduling him for an echo Doppler study to assess systolic and diastolic function.  In light of his shortness of breath.  He has not had any exercise testing since his MI urine.  I will schedule him for preoperative exercise Myoview study to assess scar/screw ischemia.  His ECG suggests no residual scar following his reperfusion.  These studies will be done over the next several weeks and I will see him back in the office in 3-4 weeks for reevaluation and if at that time, things are stable, will be given preoperative clearance. Troy Sine, MD, [facility]  02/11/2017 3:53 PM

## 2017-02-12 ENCOUNTER — Telehealth (HOSPITAL_COMMUNITY): Payer: Self-pay

## 2017-02-12 NOTE — Telephone Encounter (Signed)
Encounter complete. 

## 2017-02-12 NOTE — Telephone Encounter (Signed)
OV note 10/11 faxed to Dr. Ottlein-clearance pending testing.

## 2017-02-15 ENCOUNTER — Telehealth: Payer: Self-pay | Admitting: Cardiovascular Disease

## 2017-02-15 NOTE — Telephone Encounter (Signed)
    Chart reviewed as part of pre-operative protocol coverage.  JERAD DUNLAP was last seen on 02/11/17  by Dr. Claiborne Billings.  Pending stress test tomorrow  Prior to clearance. Wait result.    [place], PA 02/15/2017, 2:38 PM

## 2017-02-15 NOTE — Telephone Encounter (Signed)
New message         [facility] Pre-operative Risk Assessment    Request for surgical clearance:  1. What type of surgery is being performed? Bladder tumor  BRPH  intervestical chemo  2. When is this surgery scheduled? Not schedule   3. Are there any medications that need to be held prior to surgery and how long? Brilinta  - 2 days prior to procedure   4. Practice name and name of physician performing surgery? [facility]  - Dr Consuella Lose  5. What is your office phone and fax number?  [phone]  Fax [phone]  6. Anesthesia type (None, local, MAC, general) ? General    Timothy [name] 02/15/2017, 10:27 AM  _________________________________________________________________   (provider comments below)

## 2017-02-16 ENCOUNTER — Ambulatory Visit (HOSPITAL_COMMUNITY)
Admission: RE | Admit: 2017-02-16 | Discharge: 2017-02-16 | Disposition: A | Discharge status: Home / Self Care | Payer: Medicare Other | Source: Ambulatory Visit | Attending: Cardiology | Admitting: Cardiology

## 2017-02-16 DIAGNOSIS — Z01810 Encounter for preprocedural cardiovascular examination: Secondary | ICD-10-CM

## 2017-02-16 DIAGNOSIS — R079 Chest pain, unspecified: Secondary | ICD-10-CM | POA: Insufficient documentation

## 2017-02-16 DIAGNOSIS — R0609 Other forms of dyspnea: Secondary | ICD-10-CM | POA: Diagnosis not present

## 2017-02-16 DIAGNOSIS — R5383 Other fatigue: Secondary | ICD-10-CM | POA: Insufficient documentation

## 2017-02-16 DIAGNOSIS — I10 Essential (primary) hypertension: Secondary | ICD-10-CM | POA: Diagnosis not present

## 2017-02-16 DIAGNOSIS — J449 Chronic obstructive pulmonary disease, unspecified: Secondary | ICD-10-CM | POA: Diagnosis not present

## 2017-02-16 DIAGNOSIS — I2510 Atherosclerotic heart disease of native coronary artery without angina pectoris: Secondary | ICD-10-CM

## 2017-02-16 DIAGNOSIS — Z87891 Personal history of nicotine dependence: Secondary | ICD-10-CM | POA: Diagnosis not present

## 2017-02-16 LAB — MYOCARDIAL PERFUSION IMAGING
CHL RATE OF PERCEIVED EXERTION: 18
CSEPEDS: 40 s
Estimated workload: 5.7 METS
Exercise duration (min): 4 min
LV dias vol: 74 mL (ref 62–150)
LVSYSVOL: 30 mL
MPHR: 160 {beats}/min
NUC STRESS TID: 1.02
Peak HR: 171 {beats}/min
Percent HR: 106 %
Rest HR: 99 {beats}/min
SDS: 2
SRS: 1
SSS: 3

## 2017-02-16 MED ORDER — TECHNETIUM TC 99M TETROFOSMIN IV KIT
31.6000 | PACK | Freq: Once | INTRAVENOUS | Status: AC | PRN
  Administered 2017-02-16: 31.6 via INTRAVENOUS
  Filled 2017-02-16: qty 32

## 2017-02-16 MED ORDER — TECHNETIUM TC 99M TETROFOSMIN IV KIT
10.1000 | PACK | Freq: Once | INTRAVENOUS | Status: AC | PRN
  Administered 2017-02-16: 10.1 via INTRAVENOUS
  Filled 2017-02-16: qty 11

## 2017-02-22 NOTE — Telephone Encounter (Signed)
Pre-op faxed Via [facility] to [facility]  - Dr Consuella Lose

## 2017-02-22 NOTE — Telephone Encounter (Signed)
    Chart reviewed as part of pre-operative protocol coverage. Patient was contacted 02/22/2017 in reference to pre-operative risk assessment for pending surgery as outlined below.  CEBASTIAN NEIS was last seen on 10/11 by Dr Claiborne Billings.  Since that day, DILON LANK has had a MV that was low risk, EF normal.  Therefore, based on ACC/AHA guidelines, the patient would be at acceptable risk for the planned procedure without further cardiovascular testing.   Lenoard Aden 02/22/2017, 1:57 PM

## 2017-02-23 ENCOUNTER — Other Ambulatory Visit: Payer: Self-pay | Admitting: Urology

## 2017-02-23 ENCOUNTER — Ambulatory Visit: Payer: Medicare Other | Attending: Internal Medicine | Admitting: Internal Medicine

## 2017-02-23 ENCOUNTER — Encounter: Payer: Self-pay | Admitting: Internal Medicine

## 2017-02-23 ENCOUNTER — Ambulatory Visit: Payer: Medicare Other | Admitting: Internal Medicine

## 2017-02-23 VITALS — BP 135/71 | HR 83 | Temp 97.5°F | Resp 16 | Wt 124.4 lb

## 2017-02-23 DIAGNOSIS — Z79899 Other long term (current) drug therapy: Secondary | ICD-10-CM | POA: Diagnosis not present

## 2017-02-23 DIAGNOSIS — I25118 Atherosclerotic heart disease of native coronary artery with other forms of angina pectoris: Secondary | ICD-10-CM | POA: Insufficient documentation

## 2017-02-23 DIAGNOSIS — E785 Hyperlipidemia, unspecified: Secondary | ICD-10-CM | POA: Insufficient documentation

## 2017-02-23 DIAGNOSIS — C679 Malignant neoplasm of bladder, unspecified: Secondary | ICD-10-CM | POA: Diagnosis not present

## 2017-02-23 DIAGNOSIS — Z87891 Personal history of nicotine dependence: Secondary | ICD-10-CM | POA: Diagnosis not present

## 2017-02-23 DIAGNOSIS — Z888 Allergy status to other drugs, medicaments and biological substances status: Secondary | ICD-10-CM | POA: Insufficient documentation

## 2017-02-23 DIAGNOSIS — Z7951 Long term (current) use of inhaled steroids: Secondary | ICD-10-CM | POA: Insufficient documentation

## 2017-02-23 DIAGNOSIS — Z955 Presence of coronary angioplasty implant and graft: Secondary | ICD-10-CM | POA: Diagnosis not present

## 2017-02-23 DIAGNOSIS — Z886 Allergy status to analgesic agent status: Secondary | ICD-10-CM | POA: Insufficient documentation

## 2017-02-23 DIAGNOSIS — K219 Gastro-esophageal reflux disease without esophagitis: Secondary | ICD-10-CM | POA: Diagnosis not present

## 2017-02-23 DIAGNOSIS — Z23 Encounter for immunization: Secondary | ICD-10-CM | POA: Diagnosis not present

## 2017-02-23 DIAGNOSIS — R911 Solitary pulmonary nodule: Secondary | ICD-10-CM | POA: Diagnosis not present

## 2017-02-23 DIAGNOSIS — I10 Essential (primary) hypertension: Secondary | ICD-10-CM | POA: Diagnosis not present

## 2017-02-23 DIAGNOSIS — J449 Chronic obstructive pulmonary disease, unspecified: Secondary | ICD-10-CM | POA: Diagnosis not present

## 2017-02-23 DIAGNOSIS — Z8674 Personal history of sudden cardiac arrest: Secondary | ICD-10-CM | POA: Diagnosis not present

## 2017-02-23 DIAGNOSIS — R310 Gross hematuria: Secondary | ICD-10-CM | POA: Diagnosis not present

## 2017-02-23 DIAGNOSIS — I252 Old myocardial infarction: Secondary | ICD-10-CM | POA: Insufficient documentation

## 2017-02-23 DIAGNOSIS — Z7982 Long term (current) use of aspirin: Secondary | ICD-10-CM | POA: Diagnosis not present

## 2017-02-23 MED ORDER — ATORVASTATIN CALCIUM 40 MG PO TABS: 40.0000 mg | ORAL_TABLET | Freq: Every day | ORAL | 11 refills | Status: DC

## 2017-02-23 MED ORDER — PANTOPRAZOLE SODIUM 20 MG PO TBEC: 20.0000 mg | DELAYED_RELEASE_TABLET | Freq: Every day | ORAL | 4 refills | Status: DC

## 2017-02-23 MED ORDER — ALBUTEROL SULFATE HFA 108 (90 BASE) MCG/ACT IN AERS
2.0000 | INHALATION_SPRAY | Freq: Four times a day (QID) | RESPIRATORY_TRACT | 12 refills | Status: DC | PRN
Start: 2017-02-23 — End: 2020-02-25

## 2017-02-23 MED ORDER — BISOPROLOL FUMARATE 5 MG PO TABS: 2.5000 mg | ORAL_TABLET | Freq: Every day | ORAL | 6 refills | Status: DC

## 2017-02-23 MED FILL — !VENTOLIN HFA INHALER: 108 (90 BAS | 24 days supply | Qty: 18 | Fill #0

## 2017-02-23 MED FILL — ?ATORVASTATIN 40MG TABLET: 40 | 30 days supply | Qty: 30 | Fill #0

## 2017-02-23 MED FILL — PANTOPRAZOLE SOD DR 20 MG T: 20 | 30 days supply | Qty: 30 | Fill #0

## 2017-02-23 MED FILL — BISOPROLOL FUMARATE 5 MG TA: 5 | 30 days supply | Qty: 15 | Fill #0

## 2017-02-23 NOTE — Patient Instructions (Signed)
Td Vaccine (Tetanus and Diphtheria): What You Need to Know 1. Why get vaccinated? Tetanus  and diphtheria are very serious diseases. They are rare in the [place] today, but people who do become infected often have severe complications. Td vaccine is used to protect adolescents and adults from both of these diseases. Both tetanus and diphtheria are infections caused by bacteria. Diphtheria spreads from person to person through coughing or sneezing. Tetanus-causing bacteria enter the body through cuts, scratches, or wounds. TETANUS (lockjaw) causes painful muscle tightening and stiffness, usually all over the body.  It can lead to tightening of muscles in the head and neck so you can't open your mouth, swallow, or sometimes even breathe. Tetanus kills about 1 out of every 10 people who are infected even after receiving the best medical care.  DIPHTHERIA can cause a thick coating to form in the back of the throat.  It can lead to breathing problems, paralysis, heart failure, and death.  Before vaccines, as many as 200,000 cases of diphtheria and hundreds of cases of tetanus were reported in the [place] each year. Since vaccination began, reports of cases for both diseases have dropped by about 99%. 2. Td vaccine Td vaccine can protect adolescents and adults from tetanus and diphtheria. Td is usually given as a booster dose every 10 years but it can also be given earlier after a severe and dirty wound or burn. Another vaccine, called Tdap, which protects against pertussis in addition to tetanus and diphtheria, is sometimes recommended instead of Td vaccine. Your doctor or the person giving you the vaccine can give you more information. Td may safely be given at the same time as other vaccines. 3. Some people should not get this vaccine  A person who has ever had a life-threatening allergic reaction after a previous dose of any tetanus or diphtheria containing vaccine, OR has a severe  allergy to any part of this vaccine, should not get Td vaccine. Tell the person giving the vaccine about any severe allergies.  Talk to your doctor if you: ? had severe pain or swelling after any vaccine containing diphtheria or tetanus, ? ever had a condition called Guillain Barre Syndrome (GBS), ? aren't feeling well on the day the shot is scheduled. 4. What are the risks from Td vaccine? With any medicine, including vaccines, there is a chance of side effects. These are usually mild and go away on their own. Serious reactions are also possible but are rare. Most people who get Td vaccine do not have any problems with it. Mild problems following Td vaccine: (Did not interfere with activities)  Pain where the shot was given (about 8 people in 10)  Redness or swelling where the shot was given (about 1 person in 4)  Mild fever (rare)  Headache (about 1 person in 4)  Tiredness (about 1 person in 4)  Moderate problems following Td vaccine: (Interfered with activities, but did not require medical attention)  Fever over 102F (rare)  Severe problems following Td vaccine: (Unable to perform usual activities; required medical attention)  Swelling, severe pain, bleeding and/or redness in the arm where the shot was given (rare).  Problems that could happen after any vaccine:  People sometimes faint after a medical procedure, including vaccination. Sitting or lying down for about 15 minutes can help prevent fainting, and injuries caused by a fall. Tell your doctor if you feel dizzy, or have vision changes or ringing in the ears.  Some people get   severe pain in the shoulder and have difficulty moving the arm where a shot was given. This happens very rarely.  Any medication can cause a severe allergic reaction. Such reactions from a vaccine are very rare, estimated at fewer than 1 in a million doses, and would happen within a few minutes to a few hours after the vaccination. As with any  medicine, there is a very remote chance of a vaccine causing a serious injury or death. The safety of vaccines is always being monitored. For more information, visit: www.cdc.gov/vaccinesafety/ 5. What if there is a serious reaction? What should I look for? Look for anything that concerns you, such as signs of a severe allergic reaction, very high fever, or unusual behavior. Signs of a severe allergic reaction can include hives, swelling of the face and throat, difficulty breathing, a fast heartbeat, dizziness, and weakness. These would usually start a few minutes to a few hours after the vaccination. What should I do?  If you think it is a severe allergic reaction or other emergency that can't wait, call 9-1-1 or get the person to the nearest hospital. Otherwise, call your doctor.  Afterward, the reaction should be reported to the Vaccine Adverse Event Reporting System (VAERS). Your doctor might file this report, or you can do it yourself through the VAERS web site at www.vaers.hhs.gov, or by calling 1-800-822-7967. ? VAERS does not give medical advice. 6. The National Vaccine Injury Compensation Program The National Vaccine Injury Compensation Program (VICP) is a federal program that was created to compensate people who may have been injured by certain vaccines. Persons who believe they may have been injured by a vaccine can learn about the program and about filing a claim by calling 1-800-338-2382 or visiting the VICP website at www.hrsa.gov/vaccinecompensation. There is a time limit to file a claim for compensation. 7. How can I learn more?  Ask your doctor. He or she can give you the vaccine package insert or suggest other sources of information.  Call your local or state health department.  Contact the Centers for Disease Control and Prevention (CDC): ? Call 1-800-232-4636 (1-800-CDC-INFO) ? Visit CDC's website at www.cdc.gov/vaccines CDC Td Vaccine VIS (08/13/15) This information is  not intended to replace advice given to you by your health care provider. Make sure you discuss any questions you have with your health care provider. Document Released: 02/15/2006 Document Revised: 01/09/2016 Document Reviewed: 01/09/2016 Elsevier Interactive Patient Education  2017 Elsevier Inc. Influenza Virus Vaccine injection (Fluarix) What is this medicine? INFLUENZA VIRUS VACCINE (in floo EN zuh VAHY ruhs vak SEEN) helps to reduce the risk of getting influenza also known as the flu. This medicine may be used for other purposes; ask your health care provider or pharmacist if you have questions. COMMON BRAND NAME(S): Fluarix, Fluzone What should I tell my health care provider before I take this medicine? They need to know if you have any of these conditions: -bleeding disorder like hemophilia -fever or infection -Guillain-Barre syndrome or other neurological problems -immune system problems -infection with the human immunodeficiency virus (HIV) or AIDS -low blood platelet counts -multiple sclerosis -an unusual or allergic reaction to influenza virus vaccine, eggs, chicken proteins, latex, gentamicin, other medicines, foods, dyes or preservatives -pregnant or trying to get pregnant -breast-feeding How should I use this medicine? This vaccine is for injection into a muscle. It is given by a health care professional. A copy of Vaccine Information Statements will be given before each vaccination. Read this sheet carefully each   time. The sheet may change frequently. Talk to your pediatrician regarding the use of this medicine in children. Special care may be needed. Overdosage: If you think you have taken too much of this medicine contact a poison control center or emergency room at once. NOTE: This medicine is only for you. Do not share this medicine with others. What if I miss a dose? This does not apply. What may interact with this medicine? -chemotherapy or radiation  therapy -medicines that lower your immune system like etanercept, anakinra, infliximab, and adalimumab -medicines that treat or prevent blood clots like warfarin -phenytoin -steroid medicines like prednisone or cortisone -theophylline -vaccines This list may not describe all possible interactions. Give your health care provider a list of all the medicines, herbs, non-prescription drugs, or dietary supplements you use. Also tell them if you smoke, drink alcohol, or use illegal drugs. Some items may interact with your medicine. What should I watch for while using this medicine? Report any side effects that do not go away within 3 days to your doctor or health care professional. Call your health care provider if any unusual symptoms occur within 6 weeks of receiving this vaccine. You may still catch the flu, but the illness is not usually as bad. You cannot get the flu from the vaccine. The vaccine will not protect against colds or other illnesses that may cause fever. The vaccine is needed every year. What side effects may I notice from receiving this medicine? Side effects that you should report to your doctor or health care professional as soon as possible: -allergic reactions like skin rash, itching or hives, swelling of the face, lips, or tongue Side effects that usually do not require medical attention (report to your doctor or health care professional if they continue or are bothersome): -fever -headache -muscle aches and pains -pain, tenderness, redness, or swelling at site where injected -weak or tired This list may not describe all possible side effects. Call your doctor for medical advice about side effects. You may report side effects to FDA at 1-800-FDA-1088. Where should I keep my medicine? This vaccine is only given in a clinic, pharmacy, doctor's office, or other health care setting and will not be stored at home. NOTE: This sheet is a summary. It may not cover all possible  information. If you have questions about this medicine, talk to your doctor, pharmacist, or health care provider.  2018 Elsevier/Gold Standard (2007-11-16 09:30:40)  

## 2017-02-23 NOTE — Progress Notes (Signed)
Patient ID: Timothy [name], male    DOB: 07/19/1955  MRN: [id number]  CC: Follow-up   Subjective: Timothy [name] is a 61 y.o. male who presents for chronic ds management. His concerns today include:  61 yr old with hx of HTN, cardiac arrest vfib/vtach 06/2013, CAD with stent to RCA, COPD Gold stage III, former smoker, pre-DM, lung nodule on CT 08/2015 and hematuria.  1. RT lung nodule: Repeat CAT scan revealed that this was stable and unchanged. Likely benign. No need for repeat.  2. Hematuria: saw urology, Dr. Karsten Ro. Dx with bladder CA. Will need transurethral resection of bladder lesion with intravesicular chemotherapy -saw cardiology for pre-op clearance. Had nuclear stress test that revealed no new ischemia. Has echo scheduled for later this week  3. CAD/HLOccasional CP. Has not had to use.  -he did start taking the 1/2 tab of the Lipitor 80 mg since he last saw me.  He heas been tolerating this ok -not on Brinlinta. Was suppose to start Plavix but did not get it. Cardiologist did not say whether he wanted him on Plavix or not.  On ASA.  -out of Zebeta, plans to RF  4. COPD: -still has some Symbicort left. Will start Breo when done. Using Albuterol several times a wk -No increase cough or shortness of breath  4. GERD: doing well on Protonix. Requests refill  Patient Active Problem List   Diagnosis Date Noted  . Immunization due 02/23/2017  . Gross hematuria 11/20/2016  . Lung nodule 11/20/2016  . Gastroesophageal reflux disease without esophagitis 11/20/2016  . Old MI (myocardial infarction) 01/19/2015  . Prediabetes 12/29/2013  . Dental cavities 12/29/2013  . HTN (hypertension) 08/03/2013  . CAD (coronary artery disease) 06/19/2013  . Hyperlipidemia with target LDL less than 70 06/19/2013  . Bronchitis, chronic ([place]) 06/19/2013  . Tobacco abuse 06/19/2013  . NSTEMI (non-ST elevated myocardial infarction), DES to RCA 06/13/2013  . Cardiac arrest, hypothermic protocol,  vfib/vtach.  Resp arrest 06/12/2013  . VT (ventricular tachycardia), requiring 2 shocks 06/12/2013  . COPD GOLD III 06/12/2013     Current Outpatient Prescriptions on File Prior to Visit  Medication Sig Dispense Refill  . acetaminophen (TYLENOL) 325 MG tablet Take 2 tablets (650 mg total) by mouth every 4 (four) hours as needed for headache or mild pain.    Marland Kitchen albuterol (PROVENTIL) (2.5 MG/3ML) 0.083% nebulizer solution Take 3 mLs (2.5 mg total) by nebulization every 6 (six) hours as needed for wheezing or shortness of breath. 150 mL 1  . aspirin EC 81 MG tablet Take 1 tablet (81 mg total) by mouth daily. 90 tablet 2  . fluticasone furoate-vilanterol (BREO ELLIPTA) 200-25 MCG/INH AEPB Inhale 1 puff into the lungs daily. (Patient not taking: Reported on 02/23/2017) 180 each 3  . nitroGLYCERIN (NITROSTAT) 0.4 MG SL tablet Place 1 tablet (0.4 mg total) under the tongue every 5 (five) minutes as needed for chest pain. NEED OV. (Patient not taking: Reported on 02/23/2017) 25 tablet 0   No current facility-administered medications on file prior to visit.     Allergies  Allergen Reactions  . Ace Inhibitors     Wheezing, prior notes  . Aspirin Swelling    To lips.  . Ibuprofen Swelling    To lips.  . Losartan Other (See Comments)    Leg cramps    Social History   Social History  . Marital status: Divorced    Spouse name: N/A  . Number of children: N/A  .  Years of education: N/A   Occupational History  . Not on file.   Social History Main Topics  . Smoking status: Former Smoker    Packs/day: 2.00    Years: 38.00    Types: Cigarettes    Quit date: 07/10/2008  . Smokeless tobacco: Never Used  . Alcohol use No  . Drug use: No  . Sexual activity: Yes   Other Topics Concern  . Not on file   Social History Narrative  . No narrative on file    No family history on file.  Past Surgical History:  Procedure Laterality Date  . cardiac sstent    . CORONARY ANGIOPLASTY WITH STENT  PLACEMENT  06/12/13   [facility] [place] DES to RCA, NSTEMI  . LEFT HEART CATHETERIZATION WITH CORONARY ANGIOGRAM N/A 06/12/2013   Procedure: LEFT HEART CATHETERIZATION WITH CORONARY ANGIOGRAM;  Surgeon: Troy Sine, MD;  Location: [facility] CATH LAB;  Service: Cardiovascular;  Laterality: N/A;  . NO PAST SURGERIES      ROS: Review of Systems Neg except as above PHYSICAL EXAM: BP 135/71   Pulse 83   Temp (!) 97.5 F (36.4 C) (Oral)   Resp 16   Wt 124 lb 6.4 oz (56.4 kg)   SpO2 98%   BMI 20.08 kg/m   Physical Exam General appearance - alert, well appearing, and in no distress Mental status - alert, oriented to person, place, and time, normal mood, behavior, speech, dress, motor activity, and thought processes Mouth - mucous membranes moist, pharynx normal without lesions Neck - supple, no significant adenopathy Chest - clear to auscultation, no wheezes, rales or rhonchi, symmetric air entry Heart - normal rate, regular rhythm, normal S1, S2, no murmurs, rubs, clicks or gallops Extremities - peripheral pulses normal, no pedal edema, no clubbing or cyanosis  Lab Results  Component Value Date   CHOL 232 (H) 11/20/2016   HDL 50 11/20/2016   LDLCALC 153 (H) 11/20/2016   TRIG 146 11/20/2016   CHOLHDL 4.6 11/20/2016   Lab Results  Component Value Date   WBC 12.0 (H) 11/20/2016   HGB 17.3 11/20/2016   HCT 50.3 11/20/2016   MCV 90 11/20/2016   PLT 353 11/20/2016     Chemistry      Component Value Date/Time   NA 140 11/20/2016 0953   K 4.9 11/20/2016 0953   CL 99 11/20/2016 0953   CO2 23 11/20/2016 0953   BUN 14 11/20/2016 0953   CREATININE 0.98 11/20/2016 0953   CREATININE 0.74 10/21/2015 1056      Component Value Date/Time   CALCIUM 10.2 11/20/2016 0953   ALKPHOS 93 11/20/2016 0953   AST 16 11/20/2016 0953   ALT 13 11/20/2016 0953   BILITOT 0.4 11/20/2016 0953      ASSESSMENT AND PLAN: 1. Coronary artery disease of native artery of native heart with stable angina pectoris  ([place]) -Stable.. Continue aspirin, statin, beta blocker. Keep follow-up appointment with cardiology - atorvastatin (LIPITOR) 40 MG tablet; Take 1 tablet (40 mg total) by mouth daily.  Dispense: 30 tablet; Refill: 11 - bisoprolol (ZEBETA) 5 MG tablet; Take 0.5 tablets (2.5 mg total) by mouth at bedtime.  Dispense: 45 tablet; Refill: 6  2. Essential hypertension At goal. Continue DASH diet - bisoprolol (ZEBETA) 5 MG tablet; Take 0.5 tablets (2.5 mg total) by mouth at bedtime.  Dispense: 45 tablet; Refill: 6  3. Hyperlipidemia, unspecified hyperlipidemia type -Continue Lipitor 40 mg.   4. Gross hematuria Reason diagnosis of bladder CA.  Await cardiology clearance for surgery  5. Need for influenza vaccination - Flu Vaccine QUAD 6+ mos PF IM (Fluarix Quad PF)  6. Gastroesophageal reflux disease without esophagitis - pantoprazole (PROTONIX) 20 MG tablet; Take 1 tablet (20 mg total) by mouth daily.  Dispense: 30 tablet; Refill: 4  7. Need for Tdap vaccination Given  8. COPD GOLD III Stable. Continue Symbicort. Once he runs out of it will be Breo - albuterol (VENTOLIN HFA) 108 (90 Base) MCG/ACT inhaler; Inhale 2 puffs into the lungs every 6 (six) hours as needed for wheezing or shortness of breath.  Dispense: 54 g; Refill: 12  Patient was given the opportunity to ask questions.  Patient verbalized understanding of the plan and was able to repeat key elements of the plan.   Orders Placed This Encounter  Procedures  . Flu Vaccine QUAD 6+ mos PF IM (Fluarix Quad PF)  . Tdap vaccine greater than or equal to 7yo IM     Requested Prescriptions   Signed Prescriptions Disp Refills  . atorvastatin (LIPITOR) 40 MG tablet 30 tablet 11    Sig: Take 1 tablet (40 mg total) by mouth daily.  . pantoprazole (PROTONIX) 20 MG tablet 30 tablet 4    Sig: Take 1 tablet (20 mg total) by mouth daily.  . bisoprolol (ZEBETA) 5 MG tablet 45 tablet 6    Sig: Take 0.5 tablets (2.5 mg total) by mouth at  bedtime.  Marland Kitchen albuterol (VENTOLIN HFA) 108 (90 Base) MCG/ACT inhaler 54 g 12    Sig: Inhale 2 puffs into the lungs every 6 (six) hours as needed for wheezing or shortness of breath.    Return in about 3 months (around 05/26/2017).  Karle Plumber, MD, FACP

## 2017-02-24 ENCOUNTER — Other Ambulatory Visit: Payer: Self-pay

## 2017-02-24 ENCOUNTER — Ambulatory Visit (HOSPITAL_COMMUNITY): Payer: Medicare Other | Attending: Cardiovascular Disease

## 2017-02-24 DIAGNOSIS — Z01810 Encounter for preprocedural cardiovascular examination: Secondary | ICD-10-CM

## 2017-02-24 DIAGNOSIS — I959 Hypotension, unspecified: Secondary | ICD-10-CM | POA: Diagnosis not present

## 2017-02-24 DIAGNOSIS — R0609 Other forms of dyspnea: Secondary | ICD-10-CM | POA: Insufficient documentation

## 2017-02-24 DIAGNOSIS — J449 Chronic obstructive pulmonary disease, unspecified: Secondary | ICD-10-CM | POA: Diagnosis not present

## 2017-02-24 DIAGNOSIS — I071 Rheumatic tricuspid insufficiency: Secondary | ICD-10-CM | POA: Insufficient documentation

## 2017-02-24 DIAGNOSIS — Z8674 Personal history of sudden cardiac arrest: Secondary | ICD-10-CM | POA: Insufficient documentation

## 2017-02-24 DIAGNOSIS — Z87891 Personal history of nicotine dependence: Secondary | ICD-10-CM | POA: Diagnosis not present

## 2017-02-24 DIAGNOSIS — I252 Old myocardial infarction: Secondary | ICD-10-CM | POA: Diagnosis not present

## 2017-02-24 DIAGNOSIS — I2510 Atherosclerotic heart disease of native coronary artery without angina pectoris: Secondary | ICD-10-CM | POA: Diagnosis not present

## 2017-02-24 DIAGNOSIS — E785 Hyperlipidemia, unspecified: Secondary | ICD-10-CM | POA: Insufficient documentation

## 2017-03-19 ENCOUNTER — Telehealth: Payer: Self-pay | Admitting: Cardiovascular Disease

## 2017-03-19 NOTE — Patient Instructions (Signed)
SHISHIR KRANTZ  03/19/2017   Your procedure is scheduled on: 03-29-17  Report to [facility] Main  Entrance Take [place]  elevators to 3rd floor to  [place] at     [place] AM.   Call this number if you have problems the morning of surgery [phone]    Remember: ONLY 1 PERSON MAY GO WITH YOU TO SHORT STAY TO GET  READY MORNING OF [place].  Do not eat food or drink liquids :After Midnight.     Take these medicines the morning of surgery with A SIP OF WATER: NEBULIZER IF NEEDED, INHALERS AND BRING, PROTONIX                                You may not have any metal on your body including hair pins and              piercings  Do not wear jewelry, lotions, powders or perfumes, deodorant                    Men may shave face and neck.   Do not bring valuables to the hospital. [place].  Contacts, dentures or bridgework may not be worn into surgery.     Patients discharged the day of surgery will not be allowed to drive home.  Name and phone number of your driver:  Special Instructions: N/A              Please read over the following fact sheets you were given: _____________________________________________________________________          [facility] - Preparing for Surgery Before surgery, you can play an important role.  Because skin is not sterile, your skin needs to be as free of germs as possible.  You can reduce the number of germs on your skin by washing with CHG (chlorahexidine gluconate) soap before surgery.  CHG is an antiseptic cleaner which kills germs and bonds with the skin to continue killing germs even after washing. Please DO NOT use if you have an allergy to CHG or antibacterial soaps.  If your skin becomes reddened/irritated stop using the CHG and inform your nurse when you arrive at Short Stay. Do not shave (including legs and underarms) for at least 48 hours prior to the first CHG  shower.  You may shave your face/neck. Please follow these instructions carefully:  1.  Shower with CHG Soap the night before surgery and the  morning of Surgery.  2.  If you choose to wash your hair, wash your hair first as usual with your  normal  shampoo.  3.  After you shampoo, rinse your hair and body thoroughly to remove the  shampoo.                           4.  Use CHG as you would any other liquid soap.  You can apply chg directly  to the skin and wash                       Gently with a scrungie or clean washcloth.  5.  Apply the CHG Soap to your body  ONLY FROM THE NECK DOWN.   Do not use on face/ open                           Wound or open sores. Avoid contact with eyes, ears mouth and genitals (private parts).                       Wash face,  Genitals (private parts) with your normal soap.             6.  Wash thoroughly, paying special attention to the area where your surgery  will be performed.  7.  Thoroughly rinse your body with warm water from the neck down.  8.  DO NOT shower/wash with your normal soap after using and rinsing off  the CHG Soap.                9.  Pat yourself dry with a clean towel.            10.  Wear clean pajamas.            11.  Place clean sheets on your bed the night of your first shower and do not  sleep with pets. Day of Surgery : Do not apply any lotions/deodorants the morning of surgery.  Please wear clean clothes to the hospital/surgery center.  FAILURE TO FOLLOW THESE INSTRUCTIONS MAY RESULT IN THE CANCELLATION OF YOUR SURGERY PATIENT SIGNATURE_________________________________  NURSE SIGNATURE__________________________________  ________________________________________________________________________

## 2017-03-19 NOTE — Progress Notes (Signed)
Clearance on chart from 02/22/17- Rhonda Barrett,PA-cardiology EKG_11/11/18-epic LOV- 02/11/17- Dr Claiborne Billings - caridology - epic  ECHO-02/24/17-epic Stress-epic-02/24/17

## 2017-03-19 NOTE — Telephone Encounter (Signed)
Patient made aware of results and verbalized his understanding.  Notes recorded by Troy Sine, MD on 03/14/2017 at 5:27 PM EST Normal LV function, grade 2 diastolic dysfunction, mild mitral valve prolapse.

## 2017-03-19 NOTE — Telephone Encounter (Signed)
Follow up     Patient returning call for echo results

## 2017-03-22 ENCOUNTER — Encounter (HOSPITAL_COMMUNITY)
Admission: RE | Admit: 2017-03-22 | Discharge: 2017-03-22 | Disposition: A | Discharge status: Home / Self Care | Payer: Medicare Other | Source: Ambulatory Visit | Attending: Urology | Admitting: Urology

## 2017-03-22 ENCOUNTER — Encounter (INDEPENDENT_AMBULATORY_CARE_PROVIDER_SITE_OTHER): Payer: Self-pay

## 2017-03-22 ENCOUNTER — Encounter (HOSPITAL_COMMUNITY): Payer: Self-pay

## 2017-03-22 ENCOUNTER — Other Ambulatory Visit: Payer: Self-pay

## 2017-03-22 DIAGNOSIS — I10 Essential (primary) hypertension: Secondary | ICD-10-CM | POA: Insufficient documentation

## 2017-03-22 DIAGNOSIS — K219 Gastro-esophageal reflux disease without esophagitis: Secondary | ICD-10-CM | POA: Diagnosis not present

## 2017-03-22 DIAGNOSIS — I2510 Atherosclerotic heart disease of native coronary artery without angina pectoris: Secondary | ICD-10-CM | POA: Insufficient documentation

## 2017-03-22 DIAGNOSIS — Z87891 Personal history of nicotine dependence: Secondary | ICD-10-CM | POA: Insufficient documentation

## 2017-03-22 DIAGNOSIS — R310 Gross hematuria: Secondary | ICD-10-CM | POA: Insufficient documentation

## 2017-03-22 DIAGNOSIS — J449 Chronic obstructive pulmonary disease, unspecified: Secondary | ICD-10-CM | POA: Insufficient documentation

## 2017-03-22 DIAGNOSIS — Z79899 Other long term (current) drug therapy: Secondary | ICD-10-CM | POA: Diagnosis not present

## 2017-03-22 DIAGNOSIS — Z7982 Long term (current) use of aspirin: Secondary | ICD-10-CM | POA: Insufficient documentation

## 2017-03-22 DIAGNOSIS — E785 Hyperlipidemia, unspecified: Secondary | ICD-10-CM | POA: Insufficient documentation

## 2017-03-22 DIAGNOSIS — Z01812 Encounter for preprocedural laboratory examination: Secondary | ICD-10-CM | POA: Diagnosis not present

## 2017-03-22 HISTORY — DX: Prediabetes: R73.03

## 2017-03-22 HISTORY — DX: Gastro-esophageal reflux disease without esophagitis: K21.9

## 2017-03-22 HISTORY — DX: Malignant (primary) neoplasm, unspecified: C80.1

## 2017-03-22 LAB — CBC
HEMATOCRIT: 48 % (ref 39.0–52.0)
Hemoglobin: 16 g/dL (ref 13.0–17.0)
MCH: 31 pg (ref 26.0–34.0)
MCHC: 33.3 g/dL (ref 30.0–36.0)
MCV: 93 fL (ref 78.0–100.0)
Platelets: 297 10*3/uL (ref 150–400)
RBC: 5.16 MIL/uL (ref 4.22–5.81)
RDW: 13.6 % (ref 11.5–15.5)
WBC: 10.9 10*3/uL — ABNORMAL HIGH (ref 4.0–10.5)

## 2017-03-22 LAB — BASIC METABOLIC PANEL
Anion gap: 7 (ref 5–15)
BUN: 22 mg/dL — ABNORMAL HIGH (ref 6–20)
CHLORIDE: 102 mmol/L (ref 101–111)
CO2: 30 mmol/L (ref 22–32)
Calcium: 9.5 mg/dL (ref 8.9–10.3)
Creatinine, Ser: 0.97 mg/dL (ref 0.61–1.24)
GFR calc non Af Amer: >60 mL/min (ref >60)
Glucose, Bld: 103 mg/dL — ABNORMAL HIGH (ref 65–99)
POTASSIUM: 5.5 mmol/L — AB (ref 3.5–5.1)
SODIUM: 139 mmol/L (ref 135–145)

## 2017-03-22 LAB — HEMOGLOBIN A1C
HEMOGLOBIN A1C: 5.8 % — AB (ref 4.8–5.6)
MEAN PLASMA GLUCOSE: 119.76 mg/dL

## 2017-03-22 NOTE — Progress Notes (Signed)
BMP done 03/22/17 routed to Dr. Karsten Ro via epic

## 2017-03-28 NOTE — Anesthesia Preprocedure Evaluation (Addendum)
Anesthesia Evaluation  Patient identified by MRN, date of birth, ID band Patient awake    Reviewed: Allergy & Precautions, NPO status , Patient's Chart, lab work & pertinent test results, reviewed documented beta blocker date and time   Airway Mallampati: II  TM Distance: >3 FB Neck ROM: Full    Dental no notable dental hx.    Pulmonary COPD,  COPD inhaler, former smoker,    Pulmonary exam normal breath sounds clear to auscultation       Cardiovascular hypertension, Pt. on medications and Pt. on home beta blockers + CAD and + Past MI  Normal cardiovascular exam Rhythm:Regular Rate:Normal  NM stress 02/16/2017 The left ventricular ejection fraction is normal (55-65%). Nuclear stress EF: 59%. Blood pressure demonstrated a normal response to exercise. There was no ST segment deviation noted during stress. The study is normal.   1. Below average exercise tolerance.  2. No evidence for ischemia by ECG analysis.  3. Fixed apical inferior/apical perfusion defect.  Given normal wall motion, suspect soft tissue attenuation.  No ischemia.  4. EF 59%, normal wall motion.   Low risk study.    Echo 02/24/2017 - Left ventricle: The cavity size was normal. Wall thickness was normal. Systolic function was normal. The estimated ejection fraction was in the range of 55% to 60%. Wall motion was normal; there were no regional wall motion abnormalities. Features are consistent with a pseudonormal left ventricular filling pattern, with concomitant abnormal relaxation and increased filling  pressure (grade 2 diastolic dysfunction). - Mitral valve: Mild prolapse, involving the anterior leaflet.   Neuro/Psych negative neurological ROS  negative psych ROS   GI/Hepatic Neg liver ROS, GERD  ,  Endo/Other  negative endocrine ROS  Renal/GU negative Renal ROS     Musculoskeletal negative musculoskeletal ROS (+)   Abdominal   Peds   Hematology negative hematology ROS (+)   Anesthesia Other Findings   Reproductive/Obstetrics negative OB ROS                            Anesthesia Physical Anesthesia Plan  ASA: III  Anesthesia Plan: General   Post-op Pain Management:    Induction: Intravenous  PONV Risk Score and Plan: 3 and Ondansetron, Dexamethasone, Midazolam and Treatment may vary due to age or medical condition  Airway Management Planned: LMA  Additional Equipment:   Intra-op Plan:   Post-operative Plan: Extubation in OR  Informed Consent: I have reviewed the patients History and Physical, chart, labs and discussed the procedure including the risks, benefits and alternatives for the proposed anesthesia with the patient or authorized representative who has indicated his/her understanding and acceptance.   Dental advisory given  Plan Discussed with: CRNA  Anesthesia Plan Comments:         Anesthesia Quick Evaluation

## 2017-03-28 NOTE — H&P (Signed)
HPI: Timothy [name] is a 61 year-old male with diagnosed bladder tumor.  He first noticed the symptoms approximately 01/02/2014. He did see the blood in his urine. He has not seen blood clots.   He does not have a burning sensation when he urinates. He is not currently having trouble urinating.   He has not had kidney stones. He is not having pain. He has not recently had unwanted weight loss.   His last U/S or CT Scan was 08/03/2015. This condition would be considered of mild to moderate severity with no modifying factors or associated signs or symptoms other than as noted above.   01/21/17: He has been experiencing intermittent gross hematuria intermittently for approximately 3 years. A CT scan performed on 08/03/15 revealed normal upper tract with what appeared to be an obvious bladder tumor on the posterior wall on the right hand side. He was referred to urology but failed to show. He said at that time he had some discomfort in the left lower quadrant but that has now resolved. He has no history of kidney stones. He has a 76-pack-year smoking history and quit 8 years ago. There is no family history of GU malignancy or renal disease.     ALLERGIES: Ace Inhibitors - Swelling Aspirin - Swelling Ibuprofen - Swelling    MEDICATIONS: Lipitor 80 mg tablet  Bisoprolol Fumarate 5 mg tablet  Fluticasone Propionate  Low Dose Aspirin Ec 81 mg tablet, delayed release  Nitrostat 0.4 mg tablet, sublingual  Protonix 20 mg tablet, delayed release  Proventil Hfa 90 mcg hfa aerosol with adapter  Tylenol 325 mg capsule     GU PSH: None     PSH Notes: left heart cath with coronary angiogram 06/12/13)   NON-GU PSH: Cardiac Stent Placement - 2015    GU PMH: None   NON-GU PMH: COPD Coronary Artery Disease GERD Hypercholesterolemia Hypertension Myocardial Infarction Non-ST elevation (NSTEMI) myocardial infarction Prediabetes Solitary pulmonary nodule Unspecified chronic bronchitis Ventricular  tachycardia    FAMILY HISTORY: None   SOCIAL HISTORY: Marital Status: Divorced Preferred Language: English; Ethnicity: Not Hispanic Or Latino; Race: White Current Smoking Status: Patient does not smoke anymore. Has not smoked since 07/10/2008. Smoked 2 packs per day.   Tobacco Use Assessment Completed: Used Tobacco in last 30 days? Has never drank.     REVIEW OF SYSTEMS:    GU Review Male:   Patient denies frequent urination, hard to postpone urination, burning/ pain with urination, get up at night to urinate, leakage of urine, stream starts and stops, trouble starting your stream, have to strain to urinate , erection problems, and penile pain.  Gastrointestinal (Upper):   Patient reports indigestion/ heartburn. Patient denies nausea and vomiting.  Gastrointestinal (Lower):   Patient denies constipation and diarrhea.  Constitutional:   Patient denies fever, night sweats, weight loss, and fatigue.  Skin:   Patient denies skin rash/ lesion and itching.  Eyes:   Patient denies blurred vision and double vision.  Ears/ Nose/ Throat:   Patient denies sore throat and sinus problems.  Hematologic/Lymphatic:   Patient denies swollen glands and easy bruising.  Cardiovascular:   Patient denies leg swelling and chest pains.  Respiratory:   Patient denies cough and shortness of breath.  Endocrine:   Patient denies excessive thirst.  Musculoskeletal:   Patient denies back pain and joint pain.  Neurological:   Patient denies headaches and dizziness.  Psychologic:   Patient denies depression and anxiety.   VITAL SIGNS:  Weight 123 lb / 55.79 kg  Height 66 in / 167.64 cm  BP 133/84 mmHg  Pulse 86 /min  BMI 19.9 kg/m   GU PHYSICAL EXAMINATION:    Scrotum: No lesions. No edema. No cysts. No warts.  Epididymides: Right: no spermatocele, no masses, no cysts, no tenderness, no induration, no enlargement. Left: no spermatocele, no masses, no cysts, no tenderness, no induration, no enlargement.   Testes: No tenderness, no swelling, no enlargement left testes. No tenderness, no swelling, no enlargement right testes. Normal location left testes. Normal location right testes. No mass, no cyst, no varicocele, no hydrocele left testes. No mass, no cyst, no varicocele, no hydrocele right testes.  Urethral Meatus: Normal size. No lesion, no wart, no discharge, no polyp. Normal location.  Penis: Circumcised, no warts, no cracks. No dorsal Peyronie's plaques, no left corporal Peyronie's plaques, no right corporal Peyronie's plaques, no scarring, no warts. No balanitis, no meatal stenosis.   MULTI-SYSTEM PHYSICAL EXAMINATION:    Constitutional: Well-nourished. No physical deformities. Normally developed. Good grooming.  Neck: Neck symmetrical, not swollen. Normal tracheal position.  Respiratory: No labored breathing, no use of accessory muscles.   Cardiovascular: Normal temperature, normal extremity pulses, no swelling, no varicosities.  Lymphatic: No enlargement of neck, axillae, groin.  Skin: No paleness, no jaundice, no cyanosis. No lesion, no ulcer, no rash.  Neurologic / Psychiatric: Oriented to time, oriented to place, oriented to person. No depression, no anxiety, no agitation.  Gastrointestinal: No mass, no tenderness, no rigidity, non obese abdomen.  Eyes: Normal conjunctivae. Normal eyelids.  Ears, Nose, Mouth, and Throat: Left ear no scars, no lesions, no masses. Right ear no scars, no lesions, no masses. Nose no scars, no lesions, no masses. Normal hearing. Normal lips.  Musculoskeletal: Normal gait and station of head and neck.     PAST DATA REVIEWED:  Source Of History:  Patient  Lab Test Review:   BUN/Creatinine  Records Review:   Previous Doctor Records, Previous Hospital Records, POC Tool  X-Ray Review: [facility] Protocol: Reviewed Films. Reviewed Report. Discussed With Patient. His CT scan in 4/17 was performed without contrast and revealed no stones.   Notes:                      His creatinine in 7/18 was normal at 0.98.   PROCEDURES:         Flexible Cystoscopy - done on 01/21/17 Risks, benefits, and some of the potential complications of the procedure were discussed at length with the patient including infection, bleeding, voiding discomfort, urinary retention, fever, chills, sepsis, and others. All questions were answered. Informed consent was obtained. Sterile technique and 2% Lidocaine intraurethral analgesia were used.  Meatus:  Normal size. Normal location. Normal condition.  Urethra:  No strictures.  External Sphincter:  Normal.  Verumontanum:  Normal.  Prostate:  Non-obstructing. No hyperplasia.  Bladder Neck:  Non-obstructing.  Ureteral Orifices:  Normal location. Normal size. Normal shape. Effluxed clear urine.  Bladder:  A posterior wall tumor. No trabeculation. Normal mucosa. No stones.      The lower urinary tract was carefully examined. The procedure was well-tolerated and without complications. Instructions were given to call the office immediately for bloody urine, difficulty urinating, urinary retention, painful or frequent urination, fever or other illness. The patient stated that he understood these instructions and would comply with them.         Urinalysis w/Scope Dipstick Dipstick Cont'd Micro  Color: Yellow Bilirubin: Neg  WBC/hpf: NS (Not Seen)  Appearance: Clear Ketones: Neg RBC/hpf: 3 - 10/hpf  Specific Gravity: 1.025 Blood: 1+ Bacteria: NS (Not Seen)  pH: 5.5 Protein: Neg Cystals: NS (Not Seen)  Glucose: Neg Urobilinogen: 0.2 Casts: NS (Not Seen)    Nitrites: Neg Trichomonas: Not Present    Leukocyte Esterase: Neg Mucous: Not Present      Epithelial Cells: 0 - 5/hpf      Yeast: NS (Not Seen)      Sperm: Not Present    ASSESSMENT/PLAN:     ICD-10 Details  1 GU:   Gross hematuria - R31.0 It appears his gross hematuria is obviously due to the bladder tumor in his bladder. He continues to have gross hematuria and it is been over  a year since his last upper tract study was performed and therefore this will need to be repeated. I recommended performing retrograde pyelograms at the time of his TURBT.  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 We discussed the need to evaluate his upper tract as well since his CT scan was done without contrast and therefore I went over bilateral retrograde pyelography with him today as well.          Notes:   I went over the results of the CT scan as well as my cystoscopic findings today which have revealed a bladder mass consistent with transitional cell carcinoma. We discussed the fact that currently there is no evidence of extravesical extension or pelvic adenopathy based on CT scan findings. Further characterization of the lesion is required for grading and staging purposes. We discussed proceeding with evaluation using transurethral resection of the lesion. I have discussed the procedure in detail as well as the potential risks and complications associated with this form of surgery. We also discussed the probability of successful resection of the intravesical portion of this lesion. I have recommended, as long as there is no contraindication at the time of surgery, the placement of intravesical mitomycin-C in order to reduce the risk of recurrence. We did discuss the potential side effects of this form of intravesical chemotherapy. The procedure will be performed under anesthesia as an outpatient.   He is on Brilinta but that he only takes a very low dose because he can't afford any tries to stretch his prescription however I told him he would need to come off of that for 48 hours prior to the planned procedure.  He has been cleared to this by his cardiologist.

## 2017-03-29 ENCOUNTER — Encounter (HOSPITAL_COMMUNITY)
Admission: RE | Disposition: A | Discharge status: Home / Self Care | Payer: Self-pay | Source: Ambulatory Visit | Attending: Urology

## 2017-03-29 ENCOUNTER — Other Ambulatory Visit: Payer: Self-pay

## 2017-03-29 ENCOUNTER — Encounter (HOSPITAL_COMMUNITY): Payer: Self-pay | Admitting: *Deleted

## 2017-03-29 ENCOUNTER — Ambulatory Visit (HOSPITAL_COMMUNITY): Payer: Medicare Other

## 2017-03-29 ENCOUNTER — Ambulatory Visit (HOSPITAL_COMMUNITY): Payer: Medicare Other | Admitting: Anesthesiology

## 2017-03-29 ENCOUNTER — Ambulatory Visit (HOSPITAL_COMMUNITY)
Admission: RE | Admit: 2017-03-29 | Discharge: 2017-03-29 | Disposition: A | Discharge status: Home / Self Care | Payer: Medicare Other | Source: Ambulatory Visit | Attending: Urology | Admitting: Urology

## 2017-03-29 DIAGNOSIS — R0602 Shortness of breath: Secondary | ICD-10-CM | POA: Diagnosis not present

## 2017-03-29 DIAGNOSIS — N35919 Unspecified urethral stricture, male, unspecified site: Secondary | ICD-10-CM | POA: Insufficient documentation

## 2017-03-29 DIAGNOSIS — I472 Ventricular tachycardia: Secondary | ICD-10-CM | POA: Diagnosis not present

## 2017-03-29 DIAGNOSIS — Z955 Presence of coronary angioplasty implant and graft: Secondary | ICD-10-CM | POA: Insufficient documentation

## 2017-03-29 DIAGNOSIS — Z7982 Long term (current) use of aspirin: Secondary | ICD-10-CM | POA: Insufficient documentation

## 2017-03-29 DIAGNOSIS — R05 Cough: Secondary | ICD-10-CM | POA: Diagnosis not present

## 2017-03-29 DIAGNOSIS — Z79899 Other long term (current) drug therapy: Secondary | ICD-10-CM | POA: Insufficient documentation

## 2017-03-29 DIAGNOSIS — K219 Gastro-esophageal reflux disease without esophagitis: Secondary | ICD-10-CM

## 2017-03-29 DIAGNOSIS — C679 Malignant neoplasm of bladder, unspecified: Secondary | ICD-10-CM | POA: Diagnosis not present

## 2017-03-29 DIAGNOSIS — I252 Old myocardial infarction: Secondary | ICD-10-CM

## 2017-03-29 DIAGNOSIS — Z87891 Personal history of nicotine dependence: Secondary | ICD-10-CM

## 2017-03-29 DIAGNOSIS — S3729XA Other injury of bladder, initial encounter: Secondary | ICD-10-CM | POA: Diagnosis not present

## 2017-03-29 DIAGNOSIS — D494 Neoplasm of unspecified behavior of bladder: Secondary | ICD-10-CM | POA: Diagnosis not present

## 2017-03-29 DIAGNOSIS — I10 Essential (primary) hypertension: Secondary | ICD-10-CM | POA: Diagnosis not present

## 2017-03-29 DIAGNOSIS — R310 Gross hematuria: Secondary | ICD-10-CM | POA: Diagnosis not present

## 2017-03-29 DIAGNOSIS — Z7951 Long term (current) use of inhaled steroids: Secondary | ICD-10-CM

## 2017-03-29 DIAGNOSIS — E785 Hyperlipidemia, unspecified: Secondary | ICD-10-CM | POA: Diagnosis not present

## 2017-03-29 DIAGNOSIS — B9620 Unspecified Escherichia coli [E. coli] as the cause of diseases classified elsewhere: Secondary | ICD-10-CM | POA: Diagnosis not present

## 2017-03-29 DIAGNOSIS — I2510 Atherosclerotic heart disease of native coronary artery without angina pectoris: Secondary | ICD-10-CM

## 2017-03-29 DIAGNOSIS — J449 Chronic obstructive pulmonary disease, unspecified: Secondary | ICD-10-CM

## 2017-03-29 DIAGNOSIS — N3289 Other specified disorders of bladder: Secondary | ICD-10-CM | POA: Diagnosis not present

## 2017-03-29 DIAGNOSIS — A419 Sepsis, unspecified organism: Secondary | ICD-10-CM | POA: Diagnosis not present

## 2017-03-29 DIAGNOSIS — N390 Urinary tract infection, site not specified: Secondary | ICD-10-CM | POA: Diagnosis not present

## 2017-03-29 DIAGNOSIS — C678 Malignant neoplasm of overlapping sites of bladder: Secondary | ICD-10-CM

## 2017-03-29 HISTORY — PX: CYSTOSCOPY W/ RETROGRADES: SHX1426

## 2017-03-29 HISTORY — PX: TRANSURETHRAL RESECTION OF BLADDER TUMOR: SHX2575

## 2017-03-29 SURGERY — TURBT (TRANSURETHRAL RESECTION OF BLADDER TUMOR)
Anesthesia: General

## 2017-03-29 MED ORDER — ONDANSETRON HCL 4 MG/2ML IJ SOLN
INTRAMUSCULAR | Status: AC
  Filled 2017-03-29: qty 2

## 2017-03-29 MED ORDER — SODIUM CHLORIDE 0.9 % IR SOLN
Status: DC | PRN
  Administered 2017-03-29: 18000 mL

## 2017-03-29 MED ORDER — MIDAZOLAM HCL 2 MG/2ML IJ SOLN
INTRAMUSCULAR | Status: AC
  Filled 2017-03-29: qty 2

## 2017-03-29 MED ORDER — PHENYLEPHRINE 40 MCG/ML (10ML) SYRINGE FOR IV PUSH (FOR BLOOD PRESSURE SUPPORT)
PREFILLED_SYRINGE | INTRAVENOUS | Status: DC | PRN
  Administered 2017-03-29 (×2): 80 ug via INTRAVENOUS

## 2017-03-29 MED ORDER — DEXAMETHASONE SODIUM PHOSPHATE 10 MG/ML IJ SOLN
INTRAMUSCULAR | Status: AC
  Filled 2017-03-29: qty 1

## 2017-03-29 MED ORDER — FENTANYL CITRATE (PF) 100 MCG/2ML IJ SOLN
INTRAMUSCULAR | Status: AC
  Filled 2017-03-29: qty 2

## 2017-03-29 MED ORDER — HYDROCODONE-ACETAMINOPHEN 5-325 MG PO TABS
1.0000 | ORAL_TABLET | ORAL | Status: DC | PRN
Start: 2017-03-29 — End: 2017-03-29

## 2017-03-29 MED ORDER — CIPROFLOXACIN IN D5W 400 MG/200ML IV SOLN
INTRAVENOUS | Status: AC
  Filled 2017-03-29: qty 200

## 2017-03-29 MED ORDER — CIPROFLOXACIN IN D5W 400 MG/200ML IV SOLN
400.0000 mg | Freq: Once | INTRAVENOUS | Status: AC
  Administered 2017-03-29: 400 mg via INTRAVENOUS

## 2017-03-29 MED ORDER — BISOPROLOL FUMARATE 5 MG PO TABS
2.5000 mg | ORAL_TABLET | Freq: Once | ORAL | Status: AC
  Administered 2017-03-29: 2.5 mg via ORAL
  Filled 2017-03-29 (×2): qty 0.5

## 2017-03-29 MED ORDER — ONDANSETRON HCL 4 MG/2ML IJ SOLN
INTRAMUSCULAR | Status: DC | PRN
  Administered 2017-03-29: 4 mg via INTRAVENOUS

## 2017-03-29 MED ORDER — PROPOFOL 10 MG/ML IV BOLUS
INTRAVENOUS | Status: AC
  Filled 2017-03-29: qty 20

## 2017-03-29 MED ORDER — EPHEDRINE 5 MG/ML INJ
INTRAVENOUS | Status: AC
  Filled 2017-03-29: qty 10

## 2017-03-29 MED ORDER — PROPOFOL 10 MG/ML IV BOLUS
INTRAVENOUS | Status: DC | PRN
  Administered 2017-03-29: 160 mg via INTRAVENOUS

## 2017-03-29 MED ORDER — DEXAMETHASONE SODIUM PHOSPHATE 10 MG/ML IJ SOLN
INTRAMUSCULAR | Status: DC | PRN
  Administered 2017-03-29: 10 mg via INTRAVENOUS

## 2017-03-29 MED ORDER — FENTANYL CITRATE (PF) 100 MCG/2ML IJ SOLN
INTRAMUSCULAR | Status: DC | PRN
  Administered 2017-03-29 (×4): 25 ug via INTRAVENOUS

## 2017-03-29 MED ORDER — LACTATED RINGERS IV SOLN
INTRAVENOUS | Status: DC | PRN
  Administered 2017-03-29: 1000 mL
  Administered 2017-03-29: 07:00:00 via INTRAVENOUS

## 2017-03-29 MED ORDER — LIDOCAINE 2% (20 MG/ML) 5 ML SYRINGE
INTRAMUSCULAR | Status: DC | PRN
  Administered 2017-03-29: 100 mg via INTRAVENOUS

## 2017-03-29 MED ORDER — INDIGOTINDISULFONATE SODIUM 8 MG/ML IJ SOLN
INTRAMUSCULAR | Status: DC | PRN
  Administered 2017-03-29: 5 mL via INTRAVENOUS

## 2017-03-29 MED ORDER — LIDOCAINE 2% (20 MG/ML) 5 ML SYRINGE
INTRAMUSCULAR | Status: AC
  Filled 2017-03-29: qty 5

## 2017-03-29 MED ORDER — EPHEDRINE SULFATE-NACL 50-0.9 MG/10ML-% IV SOSY
PREFILLED_SYRINGE | INTRAVENOUS | Status: DC | PRN
  Administered 2017-03-29: 15 mg via INTRAVENOUS

## 2017-03-29 MED ORDER — INDIGOTINDISULFONATE SODIUM 8 MG/ML IJ SOLN
INTRAMUSCULAR | Status: AC
  Filled 2017-03-29: qty 5

## 2017-03-29 MED ORDER — PHENAZOPYRIDINE HCL 200 MG PO TABS: 200.0000 mg | ORAL_TABLET | Freq: Three times a day (TID) | ORAL | 0 refills | Status: DC | PRN

## 2017-03-29 MED ORDER — IOHEXOL 300 MG/ML  SOLN
INTRAMUSCULAR | Status: DC | PRN
  Administered 2017-03-29: 6 mL via URETHRAL

## 2017-03-29 MED ORDER — MIDAZOLAM HCL 5 MG/5ML IJ SOLN
INTRAMUSCULAR | Status: DC | PRN
  Administered 2017-03-29: 2 mg via INTRAVENOUS

## 2017-03-29 MED ORDER — 0.9 % SODIUM CHLORIDE (POUR BTL) OPTIME
TOPICAL | Status: DC | PRN
  Administered 2017-03-29: 1000 mL

## 2017-03-29 MED ORDER — MEPERIDINE HCL 50 MG/ML IJ SOLN: 6.2500 mg | INTRAMUSCULAR | Status: DC | PRN

## 2017-03-29 MED ORDER — PHENYLEPHRINE 40 MCG/ML (10ML) SYRINGE FOR IV PUSH (FOR BLOOD PRESSURE SUPPORT)
PREFILLED_SYRINGE | INTRAVENOUS | Status: AC
  Filled 2017-03-29: qty 10

## 2017-03-29 MED ORDER — PROMETHAZINE HCL 25 MG/ML IJ SOLN: 6.2500 mg | INTRAMUSCULAR | Status: DC | PRN

## 2017-03-29 MED ORDER — FENTANYL CITRATE (PF) 100 MCG/2ML IJ SOLN
25.0000 ug | INTRAMUSCULAR | Status: DC | PRN
  Administered 2017-03-29 (×2): 50 ug via INTRAVENOUS

## 2017-03-29 MED ORDER — HYDROCODONE-ACETAMINOPHEN 10-325 MG PO TABS: 1.0000 | ORAL_TABLET | ORAL | 0 refills | Status: DC | PRN

## 2017-03-29 MED FILL — PHENAZOPYRIDINE 200 MG TAB: 200 | 6 days supply | Qty: 20 | Fill #0

## 2017-03-29 MED FILL — HYDROCODON-APAP 10-325: 10-325 | 2 days supply | Qty: 20 | Fill #0

## 2017-03-29 SURGICAL SUPPLY — 35 items
BAG URINE DRAINAGE (UROLOGICAL SUPPLIES) ×1 IMPLANT
BAG URO CATCHER STRL LF (MISCELLANEOUS) ×3 IMPLANT
BALLN NEPHROSTOMY (BALLOONS) ×3
BALLOON NEPHROSTOMY (BALLOONS) IMPLANT
BASKET DAKOTA 1.9FR 11X120 (BASKET) IMPLANT
BASKET ZERO TIP 1.9FR (BASKET) ×2 IMPLANT
BASKET ZERO TIP NITINOL 2.4FR (BASKET) IMPLANT
BSKT STON RTRVL ZERO TP 1.9FR (BASKET)
BSKT STON RTRVL ZERO TP 2.4FR (BASKET)
CATH FOLEY 2WAY 5CC 20FR (CATHETERS) ×1 IMPLANT
CATH INTERMIT  6FR 70CM (CATHETERS) ×3 IMPLANT
CLOTH BEACON ORANGE TIMEOUT ST (SAFETY) ×3 IMPLANT
COVER FOOTSWITCH UNIV (MISCELLANEOUS) ×1 IMPLANT
COVER SURGICAL LIGHT HANDLE (MISCELLANEOUS) ×3 IMPLANT
ELECT LOOP 22F BIPOLAR SML (ELECTROSURGICAL) ×3
ELECT REM PT RETURN 15FT ADLT (MISCELLANEOUS) ×3 IMPLANT
ELECTRODE LOOP 22F BIPOLAR SML (ELECTROSURGICAL) IMPLANT
EVACUATOR MICROVAS BLADDER (UROLOGICAL SUPPLIES) ×1 IMPLANT
GLOVE BIOGEL M 8.0 STRL (GLOVE) ×3 IMPLANT
GOWN STRL REUS W/TWL XL LVL3 (GOWN DISPOSABLE) ×3 IMPLANT
GUIDEWIRE ANG ZIPWIRE 038X150 (WIRE) IMPLANT
GUIDEWIRE STR DUAL SENSOR (WIRE) ×3 IMPLANT
HOLDER FOLEY CATH W/STRAP (MISCELLANEOUS) ×1 IMPLANT
IV NS 1000ML (IV SOLUTION)
IV NS 1000ML BAXH (IV SOLUTION) ×2 IMPLANT
LOOP CUT BIPOLAR 24F LRG (ELECTROSURGICAL) IMPLANT
MANIFOLD NEPTUNE II (INSTRUMENTS) ×3 IMPLANT
NS IRRIG 1000ML POUR BTL (IV SOLUTION) ×2 IMPLANT
PACK CYSTO (CUSTOM PROCEDURE TRAY) ×3 IMPLANT
SET ASPIRATION TUBING (TUBING) ×2 IMPLANT
SHEATH ACCESS URETERAL 24CM (SHEATH) IMPLANT
SHEATH ACCESS URETERAL 54CM (SHEATH) IMPLANT
SHEATH URETERAL 12FRX35CM (MISCELLANEOUS) IMPLANT
SYRINGE IRR TOOMEY STRL 70CC (SYRINGE) IMPLANT
TUBING CONNECTING 10 (TUBING) ×3 IMPLANT

## 2017-03-29 NOTE — Anesthesia Procedure Notes (Signed)
Procedure Name: LMA Insertion Date/Time: 03/29/2017 7:33 AM Performed by: [place] Durand Wittmeyer C, CRNA Pre-anesthesia Checklist: Patient identified, Emergency Drugs available, Suction available and Patient being monitored Patient Re-evaluated:Patient Re-evaluated prior to induction Oxygen Delivery Method: Circle system utilized Preoxygenation: Pre-oxygenation with 100% oxygen Induction Type: IV induction Ventilation: Mask ventilation without difficulty LMA: LMA inserted LMA Size: 4.0 Number of attempts: 1 Airway Equipment and Method: Bite block Placement Confirmation: positive ETCO2 Tube secured with: Tape Dental Injury: Teeth and Oropharynx as per pre-operative assessment

## 2017-03-29 NOTE — Transfer of Care (Signed)
Immediate Anesthesia Transfer of Care Note  Patient: Timothy [name]  Procedure(s) Performed: TRANSURETHRAL RESECTION OF BLADDER TUMOR (TURBT)/ (N/A ) CYSTOSCOPY WITH LEFT RETROGRADE PYELOGRAM (Left )  Patient Location: PACU  Anesthesia Type:General  Level of Consciousness: awake and alert   Airway & Oxygen Therapy:   Post-op Assessment: Report given to RN and Post -op Vital signs reviewed and stable  Post vital signs: Reviewed and stable  Last Vitals:  Vitals:   03/29/17 0537  BP: 135/72  Pulse: 82  Resp: 18  Temp: 36.6 C  SpO2: 97%    Last Pain:  Vitals:   03/29/17 0537  TempSrc: Oral      Patients Stated Pain Goal: 4 (64/35/39 1225)  Complications: No apparent anesthesia complications

## 2017-03-29 NOTE — Anesthesia Postprocedure Evaluation (Signed)
Anesthesia Post Note  Patient: Timothy [name]  Procedure(s) Performed: TRANSURETHRAL RESECTION OF BLADDER TUMOR (TURBT)/ (N/A ) CYSTOSCOPY WITH LEFT RETROGRADE PYELOGRAM (Left )     Patient location during evaluation: PACU Anesthesia Type: General Level of consciousness: sedated and patient cooperative Pain management: pain level controlled Vital Signs Assessment: post-procedure vital signs reviewed and stable Respiratory status: spontaneous breathing Cardiovascular status: stable Anesthetic complications: no    Last Vitals:  Vitals:   03/29/17 0945 03/29/17 1000  BP: 125/73 117/75  Pulse: 85 98  Resp: 13 10  Temp:  36.4 C  SpO2: 96% 95%    Last Pain:  Vitals:   03/29/17 1000  TempSrc:   PainSc: 2                  Nolon Nations

## 2017-03-29 NOTE — Discharge Instructions (Signed)
Transurethral Resection of Bladder Tumor (TURBT)   Definition:  Transurethral Resection of the Bladder Tumor is a surgical procedure used to diagnose and remove tumors within the bladder. TURBT is the most common treatment for early stage bladder cancer.  General instructions:     Your recent bladder surgery requires very little post hospital care but some definite precautions.  Despite the fact that no skin incisions were used, the area around the bladder incisions are raw and covered with scabs to promote healing and prevent bleeding. Certain precautions are needed to insure that the scabs are not disturbed over the next 2-4 weeks while the healing proceeds.  Because the raw surface inside your bladder and the irritating effects of urine you may expect frequency of urination and/or urgency (a stronger desire to urinate) and perhaps even getting up at night more often. This will usually resolve or improve slowly over the healing period. You may see some blood in your urine over the first 6 weeks. Do not be alarmed, even if the urine was clear for a while. Get off your feet and drink lots of fluids until clearing occurs. If you start to pass clots or don't improve call us.  Catheter: (If you are discharged with a catheter.)  1. Keep your catheter secured to your leg at all times with tape or the supplied strap. 2. You may experience leakage of urine around your catheter- as long as the  catheter continues to drain, this is normal.  If your catheter stops draining  go to the ER. 3. You may also have blood in your urine, even after it has been clear for  several days; you may even pass some small blood clots or other material.  This  is normal as well.  If this happens, sit down and drink plenty of water to help  make urine to flush out your bladder.  If the blood in your urine becomes worse  after doing this, contact our office or return to the ER. 4. You may use the leg bag (small bag)  during the day, but use the large bag at  night.  Diet:  You may return to your normal diet immediately. Because of the raw surface of your bladder, alcohol, spicy foods, foods high in acid and drinks with caffeine may cause irritation or frequency and should be used in moderation. To keep your urine flowing freely and avoid constipation, drink plenty of fluids during the day (8-10 glasses). Tip: Avoid cranberry juice because it is very acidic.  Activity:  Your physical activity doesn't need to be restricted. However, if you are very active, you may see some blood in the urine. We suggest that you reduce your activity under the circumstances until the bleeding has stopped.  Bowels:  It is important to keep your bowels regular during the postoperative period. Straining with bowel movements can cause bleeding. A bowel movement every other day is reasonable. Use a mild laxative if needed, such as milk of magnesia 2-3 tablespoons, or 2 Dulcolax tablets. Call if you continue to have problems. If you had been taking narcotics for pain, before, during or after your surgery, you may be constipated. Take a laxative if necessary.    Medication:  You should resume your pre-surgery medications unless told not to. In addition you may be given an antibiotic to prevent or treat infection. Antibiotics are not always necessary. All medication should be taken as prescribed until the bottles are finished unless you are having  an unusual reaction to one of the drugs.     Indwelling Urinary Catheter Care, Adult Take good care of your catheter to keep it working and to prevent problems. How to wear your catheter Attach your catheter to your leg with tape (adhesive tape) or a leg strap. Make sure it is not too tight. If you use tape, remove any bits of tape that are already on the catheter. How to wear a drainage bag You should have:  A large overnight bag.  .  Overnight Bag You may wear the overnight  bag at any time. Always keep the bag below the level of your bladder but off the floor. When you sleep, put a clean plastic bag in a wastebasket. Then hang the bag inside the wastebasket.  How to care for your skin  Clean the skin around the catheter at least once every day.  Shower every day. Do not take baths.  Put creams, lotions, or ointments on your genital area only as told by your doctor.  Do not use powders, sprays, or lotions on your genital area. How to clean your catheter and your skin 1. Wash your hands with soap and water. 2. Wet a washcloth in warm water and gentle (mild) soap. 3. Use the washcloth to clean the skin where the catheter enters your body. Clean downward and wipe away from the catheter in small circles. Do not wipe toward the catheter. 4. Pat the area dry with a clean towel. Make sure to clean off all soap. How to care for your drainage bags Empty your drainage bag when it is ?- full or at least 2-3 times a day. Replace your drainage bag once a month or sooner if it starts to smell bad or look dirty. Do not clean your drainage bag unless told by your doctor. Emptying a drainage bag  Supplies Needed  Rubbing alcohol.  Gauze pad or cotton ball.  Tape or a leg strap.  Steps 1. Wash your hands with soap and water. 2. Separate (detach) the bag from your leg. 3. Hold the bag over the toilet or a clean container. Keep the bag below your hips and bladder. This stops pee (urine) from going back into the tube. 4. Open the pour spout at the bottom of the bag. 5. Empty the pee into the toilet or container. Do not let the pour spout touch any surface. 6. Put rubbing alcohol on a gauze pad or cotton ball. 7. Use the gauze pad or cotton ball to clean the pour spout. 8. Close the pour spout. 9. Attach the bag to your leg with tape or a leg strap. 10. Wash your hands.  How to prevent infection and other problems  Never pull on your catheter or try to remove it.  Pulling can damage tissue in your body.  Always wash your hands before and after touching your catheter.  If a leg strap gets wet, replace it with a dry one.  Drink enough fluids to keep your pee clear or pale yellow, or as told by your doctor.  Do not let the drainage bag or tubing touch the floor.  Wear cotton underwear.  If you are male, wipe from front to back after you poop (have a bowel movement).  Check on the catheter often to make sure it works and the tubing is not twisted. Get help if:  Your pee is cloudy.  Your pee smells unusually bad.  Your pee is not draining into the bag.  Your tube gets clogged.  Your catheter starts to leak.  Your bladder feels full. Get help right away if:  You have redness, swelling, or pain where the catheter enters your body.  You have fluid, pus, or a bad smell coming from the area where the catheter enters your body.  The area where the catheter enters your body feels warm.  You have a fever.  You have pain in your: ? Stomach (abdomen). ? Legs. ? Lower back. ? Bladder.  You see blood fill the catheter.  Your pee is pink or red.  You feel sick to your stomach (nauseous).  You throw up (vomit).  You have chills.  Your catheter gets pulled out. This information is not intended to replace advice given to you by your health care provider. Make sure you discuss any questions you have with your health care provider. Document Released: 08/15/2012 Document Revised: 03/18/2016 Document Reviewed: 10/03/2013 Elsevier Interactive Patient Education  2018 [place] Anesthesia, Adult, Care After These instructions provide you with information about caring for yourself after your procedure. Your health care provider may also give you more specific instructions. Your treatment has been planned according to current medical practices, but problems sometimes occur. Call your health care provider if you have any  problems or questions after your procedure. What can I expect after the procedure? After the procedure, it is common to have:  Vomiting.  A sore throat.  Mental slowness.  It is common to feel:  Nauseous.  Cold or shivery.  Sleepy.  Tired.  Sore or achy, even in parts of your body where you did not have surgery.  Follow these instructions at home: For at least 24 hours after the procedure:  Do not: ? Participate in activities where you could fall or become injured. ? Drive. ? Use heavy machinery. ? Drink alcohol. ? Take sleeping pills or medicines that cause drowsiness. ? Make important decisions or sign legal documents. ? Take care of children on your own.  Rest. Eating and drinking  If you vomit, drink water, juice, or soup when you can drink without vomiting.  Drink enough fluid to keep your urine clear or pale yellow.  Make sure you have little or no nausea before eating solid foods.  Follow the diet recommended by your health care provider. General instructions  Have a responsible adult stay with you until you are awake and alert.  Return to your normal activities as told by your health care provider. Ask your health care provider what activities are safe for you.  Take over-the-counter and prescription medicines only as told by your health care provider.  If you smoke, do not smoke without supervision.  Keep all follow-up visits as told by your health care provider. This is important. Contact a health care provider if:  You continue to have nausea or vomiting at home, and medicines are not helpful.  You cannot drink fluids or start eating again.  You cannot urinate after 8-12 hours.  You develop a skin rash.  You have fever.  You have increasing redness at the site of your procedure. Get help right away if:  You have difficulty breathing.  You have chest pain.  You have unexpected bleeding.  You feel that you are having a  life-threatening or urgent problem. This information is not intended to replace advice given to you by your health care provider. Make sure you discuss any questions you have with your health care provider.  Document Released: 07/27/2000 Document Revised: 09/23/2015 Document Reviewed: 04/04/2015 Elsevier Interactive Patient Education  [employer].

## 2017-03-29 NOTE — Op Note (Signed)
PATIENT:  Timothy [name]  PRE-OPERATIVE DIAGNOSIS: Bladder tumor  POST-OPERATIVE DIAGNOSIS: 1.  Bladder tumor 2.  Urethral stricture  PROCEDURE:  Procedure(s): 1. TRANSURETHRAL RESECTION OF BLADDER TUMOR (TURBT) (3.5 cm.) 2.  Dilatation of urethral stricture  SURGEON:  Surgeon(s): Claybon Jabs  ANESTHESIA:   General  EBL:  less than 50 mL  DRAINS: Urethral catheter (20 Fr. Foley)   SPECIMEN:  Bladder tumor  DISPOSITION OF SPECIMEN:  PATHOLOGY  Indication: [name] is a 61 year old male who experienced gross hematuria.  He was evaluated with cystoscopy which revealed large papillary bladder tumors.  It is been sometime since he had undergone upper tract evaluation and therefore I recommended evaluation of his upper tract with retrograde pyelography at the time of his transurethral bladder tumor resection.  Description of operation: The patient was taken to the operating room and administered general anesthesia. They were then placed on the table and moved to the dorsal lithotomy position after which the genitalia was sterilely prepped and draped. An official timeout was then performed.  I initially perform cystoscopy with a 21 French cystoscope which revealed some slight resistance in the mid penile urethra as well as in the proximal bulbar urethral region.  The prostate was noted to be normal in appearance but somewhat elongated with bilobar hypertrophy.  The bladder was then entered and fully inspected.  There was a large tumor involving the right hemitrigone and on the right wall of the bladder as well as extending posteriorly to the back wall of the bladder.  A second papillary tumor was noted on the floor of the bladder posterior to the left ureteral orifice.  The left UO was easily identified however the right UO could not be visualized due to the presence of tumor in the expected location of the ureteral orifice.  A 6 French open-ended ureteral catheter was then passed through  the cystoscope and into the left ureteral orifice.  I then performed a left retrograde pyelogram by injecting full-strength Omnipaque contrast through the open-ended catheter and up the left ureter which was noted to be normal throughout its length.  The intrarenal collecting system was also noted to be free of any filling defects or mass-effect.  This appeared to be a normal left collecting system and ureter.  I then had anesthesia administer one ampule of indigo carmine to aid with identification of the right UO.  I waited some time but was unable to see any blue from the anticipated location of the right UO.  I then attempted to pass the 26 French resectoscope but met resistance at the meatus.  I therefore reinserted the cystoscope and passed a 0.038 inch sensor guidewire into the bladder under direct visualization and left this in place this was passed over the guidewire I passed Heyman dilators from 18 up to 70 [place] but it was unable to pass a 28 [place] dilator easily so I left the guidewire in place and used a UroMax dilating balloon.  The balloon was positioned across the sphincter and inflated.  I then deflated the balloon and withdrew it so that it extended out through the meatus and reinflated the balloon.  This was then removed and I passed the 26 and then 28 [place] dilators over the guidewire again with fairly significant resistance noted in the mid to proximal penile urethra.  The 12 French resectoscope with the 30 lens and visual obturator were then passed into the bladder under direct visualization. The visual obturator was then  removed and the Gyrus resectoscope element with 30  lens was then inserted and the bladder was fully and systematically inspected once again with no other tumors noted.  I first began by resecting the tumor on the right side.  This was resected down to the detrusor muscle and in doing this I attempted again to locate the right ureteral orifice once all of the  tumor had been resected from the anticipated location but was unsuccessful at identifying the ureter.  Judicious cautery was used in the anticipated location of the UO.  The tumor was fully resected and I then used the Microvasive evacuator to remove all of this tumor which was sent as right bladder tumor.  I then turned my attention to the smaller tumor on the left-hand side and resected this completely.  I used the Microvasive evacuator to remove all of the second tumor and sent this labeled left bladder tumor.  Reinspection of the bladder revealed all obvious tumor had been fully resected and there was no evidence of perforation.  The bladder wall was thin on the right side on the wall and therefore I elected not to instill chemotherapy intravesically postoperatively.  I then removed the resectoscope.  A 20 French Foley catheter was then inserted in the bladder and irrigated. The irrigant returned clear with no clots. The patient was awakened and taken to the recovery room.    PLAN OF CARE: Discharge to home after PACU  PATIENT DISPOSITION:  PACU - hemodynamically stable.

## 2017-03-30 ENCOUNTER — Other Ambulatory Visit: Payer: Self-pay

## 2017-03-30 ENCOUNTER — Inpatient Hospital Stay (HOSPITAL_COMMUNITY)
Admission: EM | Admit: 2017-03-30 | Discharge: 2017-04-02 | DRG: 690 | Disposition: A | Discharge status: Home / Self Care | Payer: Medicare Other | Attending: Urology | Admitting: Urology

## 2017-03-30 ENCOUNTER — Encounter (HOSPITAL_COMMUNITY): Payer: Self-pay | Admitting: Emergency Medicine

## 2017-03-30 ENCOUNTER — Emergency Department (HOSPITAL_COMMUNITY): Payer: Medicare Other

## 2017-03-30 DIAGNOSIS — Z1612 Extended spectrum beta lactamase (ESBL) resistance: Secondary | ICD-10-CM | POA: Diagnosis present

## 2017-03-30 DIAGNOSIS — Z886 Allergy status to analgesic agent status: Secondary | ICD-10-CM

## 2017-03-30 DIAGNOSIS — Z955 Presence of coronary angioplasty implant and graft: Secondary | ICD-10-CM

## 2017-03-30 DIAGNOSIS — I2510 Atherosclerotic heart disease of native coronary artery without angina pectoris: Secondary | ICD-10-CM | POA: Diagnosis present

## 2017-03-30 DIAGNOSIS — B9620 Unspecified Escherichia coli [E. coli] as the cause of diseases classified elsewhere: Secondary | ICD-10-CM | POA: Diagnosis present

## 2017-03-30 DIAGNOSIS — Z79899 Other long term (current) drug therapy: Secondary | ICD-10-CM

## 2017-03-30 DIAGNOSIS — N390 Urinary tract infection, site not specified: Principal | ICD-10-CM | POA: Diagnosis present

## 2017-03-30 DIAGNOSIS — I10 Essential (primary) hypertension: Secondary | ICD-10-CM | POA: Diagnosis present

## 2017-03-30 DIAGNOSIS — N35919 Unspecified urethral stricture, male, unspecified site: Secondary | ICD-10-CM | POA: Diagnosis present

## 2017-03-30 DIAGNOSIS — K219 Gastro-esophageal reflux disease without esophagitis: Secondary | ICD-10-CM | POA: Diagnosis present

## 2017-03-30 DIAGNOSIS — A419 Sepsis, unspecified organism: Secondary | ICD-10-CM

## 2017-03-30 DIAGNOSIS — N3289 Other specified disorders of bladder: Secondary | ICD-10-CM | POA: Diagnosis present

## 2017-03-30 DIAGNOSIS — R310 Gross hematuria: Secondary | ICD-10-CM | POA: Diagnosis present

## 2017-03-30 DIAGNOSIS — I252 Old myocardial infarction: Secondary | ICD-10-CM

## 2017-03-30 DIAGNOSIS — Z8551 Personal history of malignant neoplasm of bladder: Secondary | ICD-10-CM

## 2017-03-30 DIAGNOSIS — Z7951 Long term (current) use of inhaled steroids: Secondary | ICD-10-CM

## 2017-03-30 DIAGNOSIS — Z888 Allergy status to other drugs, medicaments and biological substances status: Secondary | ICD-10-CM

## 2017-03-30 DIAGNOSIS — Z7982 Long term (current) use of aspirin: Secondary | ICD-10-CM

## 2017-03-30 DIAGNOSIS — IMO0002 Reserved for concepts with insufficient information to code with codable children: Secondary | ICD-10-CM

## 2017-03-30 DIAGNOSIS — S3729XA Other injury of bladder, initial encounter: Secondary | ICD-10-CM | POA: Diagnosis not present

## 2017-03-30 DIAGNOSIS — E785 Hyperlipidemia, unspecified: Secondary | ICD-10-CM | POA: Diagnosis present

## 2017-03-30 DIAGNOSIS — R0602 Shortness of breath: Secondary | ICD-10-CM

## 2017-03-30 DIAGNOSIS — C679 Malignant neoplasm of bladder, unspecified: Secondary | ICD-10-CM | POA: Diagnosis present

## 2017-03-30 DIAGNOSIS — Z87891 Personal history of nicotine dependence: Secondary | ICD-10-CM

## 2017-03-30 DIAGNOSIS — J449 Chronic obstructive pulmonary disease, unspecified: Secondary | ICD-10-CM | POA: Diagnosis present

## 2017-03-30 LAB — I-STAT CG4 LACTIC ACID, ED: Lactic Acid, Venous: 1.65 mmol/L (ref 0.5–1.9)

## 2017-03-30 NOTE — ED Triage Notes (Signed)
Pt has bladder cancer.  Pt was inpatient earlier this week due to surgery.  Bladder surgery was performed on Monday and pt was sent home with a foley.  Yesterday, pt's significant other was trying to help him get cleaned up and she accidentally hit his penis, causing it to bleed.  Pt began having fever today, malaise, and emesis.  Dark, cloudy urine noted.

## 2017-03-31 ENCOUNTER — Encounter (HOSPITAL_COMMUNITY): Payer: Self-pay

## 2017-03-31 ENCOUNTER — Emergency Department (HOSPITAL_COMMUNITY): Payer: Medicare Other

## 2017-03-31 DIAGNOSIS — R112 Nausea with vomiting, unspecified: Secondary | ICD-10-CM | POA: Diagnosis not present

## 2017-03-31 DIAGNOSIS — R509 Fever, unspecified: Secondary | ICD-10-CM | POA: Diagnosis not present

## 2017-03-31 DIAGNOSIS — R000 Tachycardia, unspecified: Secondary | ICD-10-CM | POA: Diagnosis not present

## 2017-03-31 DIAGNOSIS — C679 Malignant neoplasm of bladder, unspecified: Secondary | ICD-10-CM | POA: Diagnosis not present

## 2017-03-31 DIAGNOSIS — R05 Cough: Secondary | ICD-10-CM | POA: Diagnosis not present

## 2017-03-31 DIAGNOSIS — R1084 Generalized abdominal pain: Secondary | ICD-10-CM | POA: Diagnosis not present

## 2017-03-31 LAB — CBC WITH DIFFERENTIAL/PLATELET
Basophils Absolute: 0 10*3/uL (ref 0.0–0.1)
Basophils Relative: 0 %
EOS ABS: 0 10*3/uL (ref 0.0–0.7)
Eosinophils Relative: 0 %
HEMATOCRIT: 48.6 % (ref 39.0–52.0)
HEMOGLOBIN: 16.8 g/dL (ref 13.0–17.0)
LYMPHS ABS: 1.7 10*3/uL (ref 0.7–4.0)
Lymphocytes Relative: 7 %
MCH: 31.3 pg (ref 26.0–34.0)
MCHC: 34.6 g/dL (ref 30.0–36.0)
MCV: 90.7 fL (ref 78.0–100.0)
MONOS PCT: 14 %
Monocytes Absolute: 3.5 10*3/uL — ABNORMAL HIGH (ref 0.1–1.0)
NEUTROS ABS: 20.2 10*3/uL — AB (ref 1.7–7.7)
NEUTROS PCT: 80 %
Platelets: 280 10*3/uL (ref 150–400)
RBC: 5.36 MIL/uL (ref 4.22–5.81)
RDW: 14 % (ref 11.5–15.5)
WBC: 25.4 10*3/uL — ABNORMAL HIGH (ref 4.0–10.5)

## 2017-03-31 LAB — COMPREHENSIVE METABOLIC PANEL
ALT: 15 U/L — AB (ref 17–63)
AST: 19 U/L (ref 15–41)
Albumin: 4.1 g/dL (ref 3.5–5.0)
Alkaline Phosphatase: 79 U/L (ref 38–126)
Anion gap: 11 (ref 5–15)
BUN: 24 mg/dL — ABNORMAL HIGH (ref 6–20)
CHLORIDE: 100 mmol/L — AB (ref 101–111)
CO2: 24 mmol/L (ref 22–32)
CREATININE: 1.04 mg/dL (ref 0.61–1.24)
Calcium: 9.3 mg/dL (ref 8.9–10.3)
GFR calc non Af Amer: >60 mL/min (ref >60)
Glucose, Bld: 128 mg/dL — ABNORMAL HIGH (ref 65–99)
Potassium: 3.9 mmol/L (ref 3.5–5.1)
SODIUM: 135 mmol/L (ref 135–145)
Total Bilirubin: 0.8 mg/dL (ref 0.3–1.2)
Total Protein: 7.6 g/dL (ref 6.5–8.1)

## 2017-03-31 LAB — URINALYSIS, ROUTINE W REFLEX MICROSCOPIC
Bilirubin Urine: NEGATIVE
GLUCOSE, UA: NEGATIVE mg/dL
KETONES UR: NEGATIVE mg/dL
NITRITE: POSITIVE — AB
PROTEIN: 100 mg/dL — AB
Specific Gravity, Urine: 1.018 (ref 1.005–1.030)
Squamous Epithelial / LPF: NONE SEEN
pH: 5 (ref 5.0–8.0)

## 2017-03-31 LAB — PROTIME-INR
INR: 0.96
Prothrombin Time: 12.7 seconds (ref 11.4–15.2)

## 2017-03-31 MED ORDER — SODIUM CHLORIDE 0.9 % IV BOLUS (SEPSIS)
1000.0000 mL | Freq: Once | INTRAVENOUS | Status: AC
  Administered 2017-03-30: 1000 mL via INTRAVENOUS

## 2017-03-31 MED ORDER — ALBUTEROL SULFATE (2.5 MG/3ML) 0.083% IN NEBU: 2.5000 mg | INHALATION_SOLUTION | Freq: Four times a day (QID) | RESPIRATORY_TRACT | Status: DC | PRN

## 2017-03-31 MED ORDER — ONDANSETRON HCL 4 MG/2ML IJ SOLN: 4.0000 mg | Freq: Four times a day (QID) | INTRAMUSCULAR | Status: DC | PRN

## 2017-03-31 MED ORDER — FENTANYL CITRATE (PF) 100 MCG/2ML IJ SOLN
INTRAMUSCULAR | Status: AC
  Filled 2017-03-31: qty 2

## 2017-03-31 MED ORDER — PANTOPRAZOLE SODIUM 20 MG PO TBEC
20.0000 mg | DELAYED_RELEASE_TABLET | Freq: Every day | ORAL | Status: DC
  Administered 2017-03-31 – 2017-04-02 (×3): 20 mg via ORAL
  Filled 2017-03-31 (×3): qty 1

## 2017-03-31 MED ORDER — FENTANYL CITRATE (PF) 100 MCG/2ML IJ SOLN
50.0000 ug | Freq: Once | INTRAMUSCULAR | Status: AC
  Administered 2017-03-31: 50 ug via INTRAVENOUS

## 2017-03-31 MED ORDER — SODIUM CHLORIDE 0.9 % IV BOLUS (SEPSIS)
1000.0000 mL | Freq: Once | INTRAVENOUS | Status: AC
  Administered 2017-03-31: 1000 mL via INTRAVENOUS

## 2017-03-31 MED ORDER — ALBUTEROL SULFATE HFA 108 (90 BASE) MCG/ACT IN AERS: 2.0000 | INHALATION_SPRAY | Freq: Four times a day (QID) | RESPIRATORY_TRACT | Status: DC | PRN

## 2017-03-31 MED ORDER — NITROGLYCERIN 0.4 MG SL SUBL: 0.4000 mg | SUBLINGUAL_TABLET | SUBLINGUAL | Status: DC | PRN

## 2017-03-31 MED ORDER — ASPIRIN EC 81 MG PO TBEC
81.0000 mg | DELAYED_RELEASE_TABLET | Freq: Every day | ORAL | Status: DC
  Administered 2017-03-31 – 2017-04-02 (×3): 81 mg via ORAL
  Filled 2017-03-31 (×3): qty 1

## 2017-03-31 MED ORDER — IPRATROPIUM-ALBUTEROL 0.5-2.5 (3) MG/3ML IN SOLN
3.0000 mL | Freq: Once | RESPIRATORY_TRACT | Status: AC
  Administered 2017-03-31: 3 mL via RESPIRATORY_TRACT
  Filled 2017-03-31: qty 3

## 2017-03-31 MED ORDER — HYDROCODONE-ACETAMINOPHEN 10-325 MG PO TABS
1.0000 | ORAL_TABLET | ORAL | Status: DC | PRN
  Administered 2017-03-31 – 2017-04-02 (×4): 2 via ORAL
  Filled 2017-03-31 (×4): qty 2

## 2017-03-31 MED ORDER — MOMETASONE FURO-FORMOTEROL FUM 200-5 MCG/ACT IN AERO
2.0000 | INHALATION_SPRAY | Freq: Two times a day (BID) | RESPIRATORY_TRACT | Status: DC
  Administered 2017-04-01 – 2017-04-02 (×3): 2 via RESPIRATORY_TRACT
  Filled 2017-03-31: qty 8.8

## 2017-03-31 MED ORDER — IOPAMIDOL (ISOVUE-300) INJECTION 61%
INTRAVENOUS | Status: AC
  Administered 2017-03-31: 80 mL via INTRAVENOUS
  Filled 2017-03-31: qty 100

## 2017-03-31 MED ORDER — DEXTROSE 5 % IV SOLN
2.0000 g | Freq: Once | INTRAVENOUS | Status: AC
  Administered 2017-03-31: 2 g via INTRAVENOUS
  Filled 2017-03-31: qty 2

## 2017-03-31 MED ORDER — SODIUM CHLORIDE 0.9 % IV BOLUS (SEPSIS)
500.0000 mL | Freq: Once | INTRAVENOUS | Status: AC
  Administered 2017-03-31: 500 mL via INTRAVENOUS

## 2017-03-31 MED ORDER — ONDANSETRON HCL 4 MG PO TABS: 4.0000 mg | ORAL_TABLET | Freq: Four times a day (QID) | ORAL | Status: DC | PRN

## 2017-03-31 MED ORDER — ACETAMINOPHEN 500 MG PO TABS: 1000.0000 mg | ORAL_TABLET | ORAL | Status: DC | PRN

## 2017-03-31 MED ORDER — PIPERACILLIN-TAZOBACTAM 3.375 G IVPB
3.3750 g | Freq: Three times a day (TID) | INTRAVENOUS | Status: DC
  Administered 2017-03-31 – 2017-04-02 (×7): 3.375 g via INTRAVENOUS
  Filled 2017-03-31 (×10): qty 50

## 2017-03-31 MED ORDER — SODIUM CHLORIDE 0.9 % IV BOLUS (SEPSIS)
250.0000 mL | Freq: Once | INTRAVENOUS | Status: AC
  Administered 2017-03-31: 250 mL via INTRAVENOUS

## 2017-03-31 MED ORDER — BISOPROLOL FUMARATE 5 MG PO TABS
2.5000 mg | ORAL_TABLET | Freq: Every day | ORAL | Status: DC
  Administered 2017-03-31 – 2017-04-01 (×2): 2.5 mg via ORAL
  Filled 2017-03-31 (×2): qty 1

## 2017-03-31 MED ORDER — FENTANYL CITRATE (PF) 100 MCG/2ML IJ SOLN
25.0000 ug | Freq: Once | INTRAMUSCULAR | Status: AC
  Administered 2017-03-31: 25 ug via INTRAVENOUS
  Filled 2017-03-31: qty 2

## 2017-03-31 MED ORDER — SODIUM CHLORIDE 0.9% FLUSH
3.0000 mL | Freq: Two times a day (BID) | INTRAVENOUS | Status: DC
  Administered 2017-03-31 – 2017-04-01 (×3): 3 mL via INTRAVENOUS

## 2017-03-31 MED ORDER — ONDANSETRON HCL 4 MG/2ML IJ SOLN
4.0000 mg | Freq: Once | INTRAMUSCULAR | Status: AC
  Administered 2017-03-31: 4 mg via INTRAVENOUS
  Filled 2017-03-31: qty 2

## 2017-03-31 MED ORDER — POTASSIUM CHLORIDE IN NACL 20-0.45 MEQ/L-% IV SOLN
INTRAVENOUS | Status: DC
  Administered 2017-03-31 – 2017-04-01 (×3): via INTRAVENOUS
  Filled 2017-03-31 (×5): qty 1000

## 2017-03-31 MED ORDER — DOCUSATE SODIUM 100 MG PO CAPS
100.0000 mg | ORAL_CAPSULE | Freq: Two times a day (BID) | ORAL | Status: DC
  Administered 2017-03-31 – 2017-04-02 (×4): 100 mg via ORAL
  Filled 2017-03-31 (×4): qty 1

## 2017-03-31 MED ORDER — ATORVASTATIN CALCIUM 40 MG PO TABS
40.0000 mg | ORAL_TABLET | Freq: Every evening | ORAL | Status: DC
  Administered 2017-03-31 – 2017-04-01 (×2): 40 mg via ORAL
  Filled 2017-03-31 (×2): qty 1

## 2017-03-31 MED ORDER — IOPAMIDOL (ISOVUE-300) INJECTION 61%
100.0000 mL | Freq: Once | INTRAVENOUS | Status: AC | PRN
  Administered 2017-03-31: 80 mL via INTRAVENOUS

## 2017-03-31 MED ORDER — ACETAMINOPHEN 325 MG PO TABS
650.0000 mg | ORAL_TABLET | Freq: Once | ORAL | Status: AC
  Administered 2017-03-31: 650 mg via ORAL
  Filled 2017-03-31: qty 2

## 2017-03-31 NOTE — Progress Notes (Signed)
Patient arrived on unit via stretcher from ED.  No family at bedside.  Telemetry placed per MD order and CMT notified.  

## 2017-03-31 NOTE — Care Management Obs Status (Signed)
[facility] NOTIFICATION   Patient Details  Name: Timothy [name] MRN: [id number] Date of Birth: 1955/11/29   Medicare Observation Status Notification Given:  Yes    Nealie Mchatton, Benjaman Lobe, RN 03/31/2017, 10:59 AM

## 2017-03-31 NOTE — Progress Notes (Signed)
A consult was received from an ED physician for cefepime per pharmacy dosing.  The patient's profile has been reviewed for ht/wt/allergies/indication/available labs.   A one time order has been placed for cefepime 2 Gm.  Further antibiotics/pharmacy consults should be ordered by admitting physician if indicated.                       Thank you, Dorrene German 03/31/2017  12:37 AM

## 2017-03-31 NOTE — H&P (Signed)
I have been asked to see the patient by Dr. Ripley Fraise, for evaluation and management of fever and lower abdominal pain s/p TURBT on 11/26.  History of present illness: 61 year old male who presented to the emergency department yesterday evening with complaints of low-grade fever and worsening lower abdominal pain he is also had associated nausea with some mild emesis and lack of appetite.  Patient states that at home his fever was 100.6 Fahrenheit.  He is complaining of lower abdominal pain mostly on the right side, which has been present since the time of the operation, but has gotten progressively worse.  The pain is significantly bad when the patient coughs.  He denies any flank pain.  His urine was initially blood-tinged after the surgery, but has cleared.  He denies any bladder spasms or issues associated with this catheter.  In the emergency room the patient was noted to be slightly tachycardic with slightly low blood pressure.  He received several 500 cc normal saline boluses.  His lactate was 1.65.  He was afebrile.  His white blood cell count was noted to be 25,000.  A CT scan was performed showing a small extraperitoneal bladder perforation with associated right-sided perivesical fluid with some air associated with instrumentation from his recent operation.  He was otherwise stable.  Review of systems: A 12 point comprehensive review of systems was obtained and is negative unless otherwise stated in the history of present illness.  Patient Active Problem List   Diagnosis Date Noted  . Immunization due 02/23/2017  . Malignant neoplasm of urinary bladder ([place]) 02/23/2017  . Gross hematuria 11/20/2016  . Lung nodule 11/20/2016  . Gastroesophageal reflux disease without esophagitis 11/20/2016  . Old MI (myocardial infarction) 01/19/2015  . Prediabetes 12/29/2013  . Dental cavities 12/29/2013  . HTN (hypertension) 08/03/2013  . CAD (coronary artery disease) 06/19/2013  .  Hyperlipidemia with target LDL less than 70 06/19/2013  . Tobacco abuse 06/19/2013  . NSTEMI (non-ST elevated myocardial infarction), DES to RCA 06/13/2013  . Cardiac arrest, hypothermic protocol, vfib/vtach.  Resp arrest 06/12/2013  . VT (ventricular tachycardia), requiring 2 shocks 06/12/2013  . COPD GOLD III 06/12/2013    No current facility-administered medications on file prior to encounter.    Current Outpatient Medications on File Prior to Encounter  Medication Sig Dispense Refill  . acetaminophen (TYLENOL) 500 MG tablet Take 1,000 mg every 4 (four) hours as needed by mouth for mild pain.    Marland Kitchen albuterol (PROVENTIL) (2.5 MG/3ML) 0.083% nebulizer solution Take 3 mLs (2.5 mg total) by nebulization every 6 (six) hours as needed for wheezing or shortness of breath. 150 mL 1  . aspirin EC 81 MG tablet Take 1 tablet (81 mg total) by mouth daily. 90 tablet 2  . atorvastatin (LIPITOR) 40 MG tablet Take 1 tablet (40 mg total) by mouth daily. (Patient taking differently: Take 40 mg every evening by mouth. ) 30 tablet 11  . bisoprolol (ZEBETA) 5 MG tablet Take 0.5 tablets (2.5 mg total) by mouth at bedtime. 45 tablet 6  . budesonide-formoterol (SYMBICORT) 160-4.5 MCG/ACT inhaler Inhale 2 puffs 2 (two) times daily into the lungs.    Marland Kitchen HYDROcodone-acetaminophen (NORCO) 10-325 MG tablet Take 1-2 tablets by mouth every 4 (four) hours as needed for moderate pain. Maximum dose per 24 hours - 8 pills 20 tablet 0  . nitroGLYCERIN (NITROSTAT) 0.4 MG SL tablet Place 1 tablet (0.4 mg total) under the tongue every 5 (five) minutes as needed for chest pain.  NEED OV. 25 tablet 0  . pantoprazole (PROTONIX) 20 MG tablet Take 1 tablet (20 mg total) by mouth daily. 30 tablet 4  . phenazopyridine (PYRIDIUM) 200 MG tablet Take 1 tablet (200 mg total) by mouth 3 (three) times daily as needed for pain. 20 tablet 0  . albuterol (VENTOLIN HFA) 108 (90 Base) MCG/ACT inhaler Inhale 2 puffs into the lungs every 6 (six) hours  as needed for wheezing or shortness of breath. 54 g 12    Past Medical History:  Diagnosis Date  . Altered mental status, improved at discharge 06/19/2013  . Bronchitis, chronic ([place]) 06/19/2013  . Cancer ([place])    bladder  . Cardiac arrest, hypothermic protocol 06/12/2013  . COPD exacerbation ([place]) 06/12/2013  . GERD (gastroesophageal reflux disease)   . Hyperlipidemia LDL goal < 70 06/19/2013  . Hypokalemia, replaced 06/19/2013  . Hypomagnesemia, replaced 06/19/2013  . Hypotension, requiring levophed 06/19/2013  . NSTEMI (non-ST elevated myocardial infarction), DES to RCA 06/13/2013  . Pre-diabetes   . Tobacco abuse 06/19/2013    Past Surgical History:  Procedure Laterality Date  . cardiac sstent    . CORONARY ANGIOPLASTY WITH STENT PLACEMENT  06/12/13   Xience Alpine DES to RCA, NSTEMI  . CYSTOSCOPY W/ RETROGRADES Left 03/29/2017   Procedure: CYSTOSCOPY WITH LEFT RETROGRADE PYELOGRAM;  Surgeon: Kathie Rhodes, MD;  Location: [facility];  Service: Urology;  Laterality: Left;  . LEFT HEART CATHETERIZATION WITH CORONARY ANGIOGRAM N/A 06/12/2013   Procedure: LEFT HEART CATHETERIZATION WITH CORONARY ANGIOGRAM;  Surgeon: Troy Sine, MD;  Location: [facility] CATH LAB;  Service: Cardiovascular;  Laterality: N/A;  . NO PAST SURGERIES    . TRANSURETHRAL RESECTION OF BLADDER TUMOR N/A 03/29/2017   Procedure: TRANSURETHRAL RESECTION OF BLADDER TUMOR (TURBT)/;  Surgeon: Kathie Rhodes, MD;  Location: [facility];  Service: Urology;  Laterality: N/A;  . transurethral ressection of bladder tumor     Dr. Karsten Ro 03/29/17    Social History   Tobacco Use  . Smoking status: Former Smoker    Packs/day: 2.00    Years: 38.00    Pack years: 76.00    Types: Cigarettes    Last attempt to quit: 07/10/2008    Years since quitting: 8.7  . Smokeless tobacco: Never Used  Substance Use Topics  . Alcohol use: No  . Drug use: No    No family history on file.  PE: Vitals:   03/31/17 0348 03/31/17 0400 03/31/17 0430  03/31/17 0542  BP: 100/68 97/66 96/62  111/72  Pulse: (!) 105   97  Resp: 18 12 12 17   Temp:      TempSrc:      SpO2: 90%  91% 94%  Weight:      Height:       Patient appears to be in no acute distress  patient is alert and oriented x3 Atraumatic normocephalic head No cervical or supraclavicular lymphadenopathy appreciated No increased work of breathing, no audible wheezes/rhonchi Regular sinus rhythm/rate Abdomen is soft, with some tenderness to palpation in the right lower quadrant.  His Foley catheter is in place draining clear yellow urine. Lower extremities are symmetric without appreciable edema Grossly neurologically intact No identifiable skin lesions  Recent Labs    03/30/17 2340  WBC 25.4*  HGB 16.8  HCT 48.6   Recent Labs    03/30/17 2340  NA 135  K 3.9  CL 100*  CO2 24  GLUCOSE 128*  BUN 24*  CREATININE 1.04  CALCIUM 9.3  Recent Labs    03/30/17 2340  INR 0.96   No results for input(s): LABURIN in the last 72 hours. Results for orders placed or performed during the hospital encounter of 08/03/15  Urine culture     Status: None   Collection Time: 08/02/15 10:33 PM  Result Value Ref Range Status   Specimen Description URINE, RANDOM  Final   Special Requests NONE  Final   Culture 8,000 COLONIES/mL INSIGNIFICANT GROWTH  Final   Report Status 08/04/2015 FINAL  Final    Imaging: I have reviewed the patient's CT scan performed in the emergency room with the above findings as noted in his HPI.  Imp: The patient has likely developed a urinary tract infection and has a small extraperitoneal bladder perforation with associated postoperative changes and some perivesical fluid.  He was slightly tachycardic with mildly low blood pressure, but is otherwise well-appearing and stable.  His lactate was essentially normal.  He has baseline severe COPD and as such, I do not think his oxygen requirement currently is significantly worse than his  baseline.  Recommendations: The plan is to admit the patient for observation and start him on broad-spectrum antibiotics while his cultures are pending.  The extraperitoneal bladder perforation does not need any additional management other than the Foley catheter that is currently in place.  The patient should be started on a normal diet with oral pain medication and IV pain medication as needed.  The Foley catheter will remain in place for at least a week, and will be removed at the discretion of Dr. Karsten Ro.  All his home medications will be restarted including those for his severe COPD.

## 2017-03-31 NOTE — ED Notes (Signed)
Significant other phone [name] Bruggeman [phone].

## 2017-03-31 NOTE — ED Provider Notes (Signed)
[facility] DEPT Provider Note   CSN: [id number] Arrival date & time: 03/30/17  2314     History   Chief Complaint Chief Complaint  Patient presents with  . Fever    HPI Timothy [name] is a 61 y.o. male.  The history is provided by the patient and the spouse.  Fever   This is a new problem. The current episode started 6 to 12 hours ago. The problem occurs constantly. Associated symptoms include cough. Pertinent negatives include no chest pain and no vomiting. He has tried nothing for the symptoms.  Patient with history of bladder cancer presents with fever for the past day His nausea and cough No Vomiting or diarrhea He has lower abdominal tenderness Foley catheter in place after recent bladder procedure   Past Medical History:  Diagnosis Date  . Altered mental status, improved at discharge 06/19/2013  . Bronchitis, chronic ([place]) 06/19/2013  . Cancer ([place])    bladder  . Cardiac arrest, hypothermic protocol 06/12/2013  . COPD exacerbation ([place]) 06/12/2013  . GERD (gastroesophageal reflux disease)   . Hyperlipidemia LDL goal < 70 06/19/2013  . Hypokalemia, replaced 06/19/2013  . Hypomagnesemia, replaced 06/19/2013  . Hypotension, requiring levophed 06/19/2013  . NSTEMI (non-ST elevated myocardial infarction), DES to RCA 06/13/2013  . Pre-diabetes   . Tobacco abuse 06/19/2013    Patient Active Problem List   Diagnosis Date Noted  . Immunization due 02/23/2017  . Malignant neoplasm of urinary bladder ([place]) 02/23/2017  . Gross hematuria 11/20/2016  . Lung nodule 11/20/2016  . Gastroesophageal reflux disease without esophagitis 11/20/2016  . Old MI (myocardial infarction) 01/19/2015  . Prediabetes 12/29/2013  . Dental cavities 12/29/2013  . HTN (hypertension) 08/03/2013  . CAD (coronary artery disease) 06/19/2013  . Hyperlipidemia with target LDL less than 70 06/19/2013  . Tobacco abuse 06/19/2013  . NSTEMI (non-ST elevated myocardial  infarction), DES to RCA 06/13/2013  . Cardiac arrest, hypothermic protocol, vfib/vtach.  Resp arrest 06/12/2013  . VT (ventricular tachycardia), requiring 2 shocks 06/12/2013  . COPD GOLD III 06/12/2013    Past Surgical History:  Procedure Laterality Date  . cardiac sstent    . CORONARY ANGIOPLASTY WITH STENT PLACEMENT  06/12/13   Xience Alpine DES to RCA, NSTEMI  . CYSTOSCOPY W/ RETROGRADES Left 03/29/2017   Procedure: CYSTOSCOPY WITH LEFT RETROGRADE PYELOGRAM;  Surgeon: Kathie Rhodes, MD;  Location: [facility];  Service: Urology;  Laterality: Left;  . LEFT HEART CATHETERIZATION WITH CORONARY ANGIOGRAM N/A 06/12/2013   Procedure: LEFT HEART CATHETERIZATION WITH CORONARY ANGIOGRAM;  Surgeon: Troy Sine, MD;  Location: [facility] CATH LAB;  Service: Cardiovascular;  Laterality: N/A;  . NO PAST SURGERIES    . TRANSURETHRAL RESECTION OF BLADDER TUMOR N/A 03/29/2017   Procedure: TRANSURETHRAL RESECTION OF BLADDER TUMOR (TURBT)/;  Surgeon: Kathie Rhodes, MD;  Location: [facility];  Service: Urology;  Laterality: N/A;  . transurethral ressection of bladder tumor     Dr. Karsten Ro 03/29/17       Home Medications    Prior to Admission medications   Medication Sig Start Date End Date Taking? Authorizing Provider  acetaminophen (TYLENOL) 500 MG tablet Take 1,000 mg every 4 (four) hours as needed by mouth for mild pain.   Yes [provider]  albuterol (PROVENTIL) (2.5 MG/3ML) 0.083% nebulizer solution Take 3 mLs (2.5 mg total) by nebulization every 6 (six) hours as needed for wheezing or shortness of breath. 11/20/16  Yes Ladell Pier, MD  aspirin EC 81  MG tablet Take 1 tablet (81 mg total) by mouth daily. 10/21/15  Yes Langeland, Dawn T, MD  atorvastatin (LIPITOR) 40 MG tablet Take 1 tablet (40 mg total) by mouth daily. Patient taking differently: Take 40 mg every evening by mouth.  02/23/17  Yes Ladell Pier, MD  bisoprolol (ZEBETA) 5 MG tablet Take 0.5 tablets (2.5 mg total) by mouth at  bedtime. 02/23/17  Yes Ladell Pier, MD  budesonide-formoterol [facility]) 160-4.5 MCG/ACT inhaler Inhale 2 puffs 2 (two) times daily into the lungs.   Yes [provider]  HYDROcodone-acetaminophen (NORCO) 10-325 MG tablet Take 1-2 tablets by mouth every 4 (four) hours as needed for moderate pain. Maximum dose per 24 hours - 8 pills 03/29/17  Yes Kathie Rhodes, MD  nitroGLYCERIN (NITROSTAT) 0.4 MG SL tablet Place 1 tablet (0.4 mg total) under the tongue every 5 (five) minutes as needed for chest pain. NEED OV. 11/20/16  Yes Ladell Pier, MD  pantoprazole (PROTONIX) 20 MG tablet Take 1 tablet (20 mg total) by mouth daily. 02/23/17  Yes Ladell Pier, MD  phenazopyridine (PYRIDIUM) 200 MG tablet Take 1 tablet (200 mg total) by mouth 3 (three) times daily as needed for pain. 03/29/17  Yes Kathie Rhodes, MD  albuterol (VENTOLIN HFA) 108 (90 Base) MCG/ACT inhaler Inhale 2 puffs into the lungs every 6 (six) hours as needed for wheezing or shortness of breath. 02/23/17   Ladell Pier, MD    Family History No family history on file.  Social History Social History   Tobacco Use  . Smoking status: Former Smoker    Packs/day: 2.00    Years: 38.00    Pack years: 76.00    Types: Cigarettes    Last attempt to quit: 07/10/2008    Years since quitting: 8.7  . Smokeless tobacco: Never Used  Substance Use Topics  . Alcohol use: No  . Drug use: No     Allergies   Ace inhibitors; Aspirin; Ibuprofen; and Losartan   Review of Systems Review of Systems  Constitutional: Positive for fever.  Respiratory: Positive for cough.   Cardiovascular: Negative for chest pain.  Gastrointestinal: Negative for vomiting.  All other systems reviewed and are negative.    Physical Exam Updated Vital Signs BP 115/81 (BP Location: Left Arm)   Pulse (!) 131   Temp (!) 100.6 F (38.1 C) (Rectal)   Resp (!) 22   Ht 1.676 m (5\' 6" )   Wt 55.8 kg (123 lb)   SpO2 (!) 89%   BMI 19.85  kg/m   Physical Exam CONSTITUTIONAL: Chronically ill-appearing, disheveled HEAD: Normocephalic/atraumatic EYES: EOMI/PERRL ENMT: Mucous membranes moist NECK: supple no meningeal signs SPINE/BACK:entire spine nontender CV: S1/S2 noted, tachycardic LUNGS: Wheezing bilaterally ABDOMEN: soft, moderate lower abdominal tenderness, no rebound or guarding, bowel sounds noted throughout abdomen GU:no cva tenderness, catheter in place, no erythema noted to genitalia, nurse chaperone present for exam NEURO: Pt is awake/alert/appropriate, moves all extremitiesx4.  No facial droop.   EXTREMITIES: pulses normal/equal, full ROM SKIN: warm, color normal PSYCH: no abnormalities of mood noted, alert and oriented to situation   ED Treatments / Results  Labs (all labs ordered are listed, but only abnormal results are displayed) Labs Reviewed  COMPREHENSIVE METABOLIC PANEL - Abnormal; Notable for the following components:      Result Value   Chloride 100 (*)    Glucose, Bld 128 (*)    BUN 24 (*)    ALT 15 (*)  All other components within normal limits  CBC WITH DIFFERENTIAL/PLATELET - Abnormal; Notable for the following components:   WBC 25.4 (*)    Neutro Abs 20.2 (*)    Monocytes Absolute 3.5 (*)    All other components within normal limits  URINALYSIS, ROUTINE W REFLEX MICROSCOPIC - Abnormal; Notable for the following components:   Color, Urine AMBER (*)    APPearance HAZY (*)    Hgb urine dipstick LARGE (*)    Protein, ur 100 (*)    Nitrite POSITIVE (*)    Leukocytes, UA LARGE (*)    Bacteria, UA MANY (*)    All other components within normal limits  CULTURE, BLOOD (ROUTINE X 2)  CULTURE, BLOOD (ROUTINE X 2)  URINE CULTURE  PROTIME-INR  I-STAT CG4 LACTIC ACID, ED  I-STAT CG4 LACTIC ACID, ED    EKG  EKG Interpretation None       Radiology Ct Abdomen Pelvis W Contrast  Result Date: 03/31/2017 CLINICAL DATA:  Fever, malaise and emesis. Status post surgery for bladder  cancer March 29, 2017, indwelling catheter. EXAM: CT ABDOMEN AND PELVIS WITH CONTRAST TECHNIQUE: Multidetector CT imaging of the abdomen and pelvis was performed using the standard protocol following bolus administration of intravenous contrast. CONTRAST:  80 cc Isovue-300 COMPARISON:  CT abdomen and pelvis August 03, 2015 FINDINGS: LOWER CHEST: Bilateral lower lobe mucoid impaction with atelectasis. HEPATOBILIARY: Liver and gallbladder are normal. PANCREAS: Normal. SPLEEN: Normal. ADRENALS/URINARY TRACT: Decompressed urinary bladder containing Foley catheter and air. Irregular wall thickening with focal defect RIGHT and possibly LEFT posterolateral walls (axial 67/94). Contrast extravasation from the bladder into RIGHT pelvis (axial 9/19, series 8). Kidneys are orthotopic, demonstrating symmetric enhancement. No nephrolithiasis, hydronephrosis or solid renal masses. RIGHT greater than LEFT proximal urothelial enhancement. STOMACH/BOWEL: The stomach, small and large bowel are normal in course and caliber without inflammatory changes, sensitivity decreased without oral contrast. Mild amount of retained large bowel stool. Normal appendix. VASCULAR/LYMPHATIC: Aortoiliac vessels are normal in course and caliber. Moderate calcific atherosclerosis. No lymphadenopathy by CT size criteria. REPRODUCTIVE: Mild prostatomegaly. OTHER: Moderate amount of free fluid in RIGHT pelvis, extending to presacral space with small amount of free air. MUSCULOSKELETAL: Nonacute. Grade 1 L5-S1 anterolisthesis on the basis of chronic bilateral L5 pars interarticularis defects. Mild lower lumbar levoscoliosis. IMPRESSION: 1. Bladder perforation with moderate amount of fluid in the pelvis most compatible with blood products and urine. Small amount of RIGHT pelvic free air. 2. Decompressed bladder containing a Foley catheter. Mild urothelial enhancement may be infectious or inflammatory. 3. Bilateral lower lobe mucoid impaction and atelectasis.  4. Acute findings discussed with and reconfirmed by Dr.Kaylor Simenson on 03/31/2017 at 3:27 am. Aortic Atherosclerosis (ICD10-I70.0). Electronically Signed   By: Elon Alas M.D.   On: 03/31/2017 03:31   Dg Chest Port 1 View  Result Date: 03/31/2017 CLINICAL DATA:  Cough with possible sepsis EXAM: PORTABLE CHEST 1 VIEW COMPARISON:  CT 11/25/2016, radiograph 01/21/2015 FINDINGS: Emphysematous changes. No acute consolidation or effusion. Normal heart size. No pneumothorax. Aortic atherosclerosis. IMPRESSION: No active disease.  Emphysematous disease. Electronically Signed   By: Donavan Foil M.D.   On: 03/31/2017 00:13   Dg C-arm 1-60 Min-no Report  Result Date: 03/29/2017 Fluoroscopy was utilized by the requesting physician.  No radiographic interpretation.    Procedures Procedures  CRITICAL CARE Performed by: Sharyon Cable Total critical care time: 40 minutes Critical care time was exclusive of separately billable procedures and treating other patients. Critical care was necessary to  treat or prevent imminent or life-threatening deterioration. Critical care was time spent personally by me on the following activities: development of treatment plan with patient and/or surrogate as well as nursing, discussions with consultants, evaluation of patient's response to treatment, examination of patient, obtaining history from patient or surrogate, ordering and performing treatments and interventions, ordering and review of laboratory studies, ordering and review of radiographic studies, pulse oximetry and re-evaluation of patient's condition. Patient found to have bladder perforation which will require admission IV fluids and IV antibiotics  Medications Ordered in ED Medications  fentaNYL (SUBLIMAZE) injection 25 mcg (not administered)  sodium chloride 0.9 % bolus 1,000 mL (not administered)  fentaNYL (SUBLIMAZE) injection 50 mcg (50 mcg Intravenous Given 03/31/17 0005)  acetaminophen  (TYLENOL) tablet 650 mg (650 mg Oral Given 03/31/17 0140)  ondansetron (ZOFRAN) injection 4 mg (4 mg Intravenous Given 03/31/17 0137)  ipratropium-albuterol (DUONEB) 0.5-2.5 (3) MG/3ML nebulizer solution 3 mL (3 mLs Nebulization Given 03/31/17 0050)  sodium chloride 0.9 % bolus 1,000 mL (0 mLs Intravenous Stopped 03/31/17 0015)    And  sodium chloride 0.9 % bolus 500 mL (0 mLs Intravenous Stopped 03/31/17 0140)    And  sodium chloride 0.9 % bolus 250 mL (0 mLs Intravenous Stopped 03/31/17 0145)  ceFEPIme (MAXIPIME) 2 g in dextrose 5 % 50 mL IVPB (0 g Intravenous Stopped 03/31/17 0207)  iopamidol (ISOVUE-300) 61 % injection 100 mL (80 mLs Intravenous Contrast Given 03/31/17 0258)     Initial Impression / Assessment and Plan / ED Course  I have reviewed the triage vital signs and the nursing notes.  Pertinent labs & imaging results that were available during my care of the patient were reviewed by me and considered in my medical decision making (see chart for details).     1:14 AM Patient underwent a TURBT and dilation of urethral stricture on November 26 Currently has Foley catheter in place He Is noted to be febrile and tachycardic He also has lower abdominal tenderness Proceed with CT imaging 3:29 AM Discussed case with radiology Dr. Dorann Lodge Patient has been found to have a bladder perforation on CT Stat call urology has been placed Fortunately patient has remained hemodynamically stable in the emergency department 4:11 AM Discussed with Dr. Louis Meckel with urology Plan to admit, IV fluids and IV antibiotics, and keep Foley in place He will not require immediate operative management, but may need surgery later on the day We will keep patient n.p.o. Discussed with patient and wife They agree with plan  Patient continues to remain stable Abdomen is not rigid he does continue to have lower abdominal tenderness Patient is currently not septic appearing Final Clinical  Impressions(s) / ED Diagnoses   Final diagnoses:  Sepsis, due to unspecified organism [facility])  Perforation of bladder    ED Discharge Orders    None       Ripley Fraise, MD 03/31/17 [phone]

## 2017-04-01 DIAGNOSIS — Z888 Allergy status to other drugs, medicaments and biological substances status: Secondary | ICD-10-CM | POA: Diagnosis not present

## 2017-04-01 DIAGNOSIS — J449 Chronic obstructive pulmonary disease, unspecified: Secondary | ICD-10-CM | POA: Diagnosis present

## 2017-04-01 DIAGNOSIS — Z79899 Other long term (current) drug therapy: Secondary | ICD-10-CM | POA: Diagnosis not present

## 2017-04-01 DIAGNOSIS — I252 Old myocardial infarction: Secondary | ICD-10-CM | POA: Diagnosis not present

## 2017-04-01 DIAGNOSIS — E785 Hyperlipidemia, unspecified: Secondary | ICD-10-CM | POA: Diagnosis present

## 2017-04-01 DIAGNOSIS — Z8551 Personal history of malignant neoplasm of bladder: Secondary | ICD-10-CM | POA: Diagnosis not present

## 2017-04-01 DIAGNOSIS — Z886 Allergy status to analgesic agent status: Secondary | ICD-10-CM | POA: Diagnosis not present

## 2017-04-01 DIAGNOSIS — Z7951 Long term (current) use of inhaled steroids: Secondary | ICD-10-CM | POA: Diagnosis not present

## 2017-04-01 DIAGNOSIS — Z1612 Extended spectrum beta lactamase (ESBL) resistance: Secondary | ICD-10-CM | POA: Diagnosis present

## 2017-04-01 DIAGNOSIS — K219 Gastro-esophageal reflux disease without esophagitis: Secondary | ICD-10-CM | POA: Diagnosis present

## 2017-04-01 DIAGNOSIS — Z7982 Long term (current) use of aspirin: Secondary | ICD-10-CM | POA: Diagnosis not present

## 2017-04-01 DIAGNOSIS — R1031 Right lower quadrant pain: Secondary | ICD-10-CM | POA: Diagnosis not present

## 2017-04-01 DIAGNOSIS — I10 Essential (primary) hypertension: Secondary | ICD-10-CM | POA: Diagnosis present

## 2017-04-01 DIAGNOSIS — Z955 Presence of coronary angioplasty implant and graft: Secondary | ICD-10-CM | POA: Diagnosis not present

## 2017-04-01 DIAGNOSIS — R112 Nausea with vomiting, unspecified: Secondary | ICD-10-CM | POA: Diagnosis not present

## 2017-04-01 DIAGNOSIS — C679 Malignant neoplasm of bladder, unspecified: Secondary | ICD-10-CM | POA: Diagnosis present

## 2017-04-01 DIAGNOSIS — R509 Fever, unspecified: Secondary | ICD-10-CM | POA: Diagnosis not present

## 2017-04-01 DIAGNOSIS — R0602 Shortness of breath: Secondary | ICD-10-CM | POA: Diagnosis not present

## 2017-04-01 DIAGNOSIS — N390 Urinary tract infection, site not specified: Secondary | ICD-10-CM | POA: Diagnosis present

## 2017-04-01 DIAGNOSIS — R310 Gross hematuria: Secondary | ICD-10-CM | POA: Diagnosis present

## 2017-04-01 DIAGNOSIS — B9620 Unspecified Escherichia coli [E. coli] as the cause of diseases classified elsewhere: Secondary | ICD-10-CM | POA: Diagnosis present

## 2017-04-01 DIAGNOSIS — N35919 Unspecified urethral stricture, male, unspecified site: Secondary | ICD-10-CM | POA: Diagnosis present

## 2017-04-01 DIAGNOSIS — I2510 Atherosclerotic heart disease of native coronary artery without angina pectoris: Secondary | ICD-10-CM | POA: Diagnosis present

## 2017-04-01 DIAGNOSIS — N3289 Other specified disorders of bladder: Secondary | ICD-10-CM | POA: Diagnosis present

## 2017-04-01 DIAGNOSIS — Z87891 Personal history of nicotine dependence: Secondary | ICD-10-CM | POA: Diagnosis not present

## 2017-04-01 LAB — BASIC METABOLIC PANEL
ANION GAP: 6 (ref 5–15)
BUN: 11 mg/dL (ref 6–20)
CALCIUM: 8.7 mg/dL — AB (ref 8.9–10.3)
CHLORIDE: 106 mmol/L (ref 101–111)
CO2: 25 mmol/L (ref 22–32)
CREATININE: 0.83 mg/dL (ref 0.61–1.24)
GFR calc non Af Amer: >60 mL/min (ref >60)
Glucose, Bld: 111 mg/dL — ABNORMAL HIGH (ref 65–99)
Potassium: 3.9 mmol/L (ref 3.5–5.1)
Sodium: 137 mmol/L (ref 135–145)

## 2017-04-01 LAB — CBC
HEMATOCRIT: 42.7 % (ref 39.0–52.0)
Hemoglobin: 14.1 g/dL (ref 13.0–17.0)
MCH: 30.7 pg (ref 26.0–34.0)
MCHC: 33 g/dL (ref 30.0–36.0)
MCV: 92.8 fL (ref 78.0–100.0)
Platelets: 222 10*3/uL (ref 150–400)
RBC: 4.6 MIL/uL (ref 4.22–5.81)
RDW: 14.2 % (ref 11.5–15.5)
WBC: 20.1 10*3/uL — ABNORMAL HIGH (ref 4.0–10.5)

## 2017-04-01 LAB — HIV ANTIBODY (ROUTINE TESTING W REFLEX): HIV Screen 4th Generation wRfx: NONREACTIVE

## 2017-04-01 NOTE — Progress Notes (Signed)
Patient ID: Timothy [name], male   DOB: August 10, 1955, 61 y.o.   MRN: [id number]    Assessment: Extraperitoneal bladder perforation: His Foley catheter is draining.  His urine remains clear and his pain is decreasing.  I will continue Foley catheter drainage upon discharge.  UTI: His culture remains pending but his white blood cell count has improved and he is no longer having any fever.  He will remain on Zosyn until final culture results return.  Plan: 1.  Continue Foley catheter drainage. 2.  Continue Zosyn. 3.  Await culture results. 4.  Anticipate possible discharge tomorrow.  Subjective: He reports decreased right lower quadrant/pelvic pain but is still requiring pain medication.  Objective: Vital signs in last 24 hours: Temp:  [97.8 F (36.6 C)-98.4 F (36.9 C)] 98.4 F (36.9 C) (11/29 0431) Pulse Rate:  [90-108] 90 (11/29 0431) Resp:  [11-18] 15 (11/29 0431) BP: (101-130)/(62-75) 106/65 (11/29 0431) SpO2:  [92 %-96 %] 95 % (11/29 0431)  Intake/Output from previous day: 11/28 0701 - 11/29 0700 In: 1533.8 [P.O.:480; I.V.:953.8; IV Piggyback:100] Out: 1300 [Urine:1300] Intake/Output this shift: No intake/output data recorded.  Past Medical History:  Diagnosis Date  . Altered mental status, improved at discharge 06/19/2013  . Bronchitis, chronic ([place]) 06/19/2013  . Cancer ([place])    bladder  . Cardiac arrest, hypothermic protocol 06/12/2013  . COPD exacerbation ([place]) 06/12/2013  . GERD (gastroesophageal reflux disease)   . Hyperlipidemia LDL goal < 70 06/19/2013  . Hypokalemia, replaced 06/19/2013  . Hypomagnesemia, replaced 06/19/2013  . Hypotension, requiring levophed 06/19/2013  . NSTEMI (non-ST elevated myocardial infarction), DES to RCA 06/13/2013  . Pre-diabetes   . Tobacco abuse 06/19/2013   Current Facility-Administered Medications  Medication Dose Route Frequency Provider Last Rate Last Dose  . 0.45 % NaCl with KCl 20 mEq / L infusion   Intravenous Continuous Ardis Hughs, MD 75 mL/hr at 04/01/17 0353    . acetaminophen (TYLENOL) tablet 1,000 mg  1,000 mg Oral Q4H PRN Ardis Hughs, MD      . albuterol (PROVENTIL) (2.5 MG/3ML) 0.083% nebulizer solution 2.5 mg  2.5 mg Nebulization Q6H PRN Ardis Hughs, MD      . aspirin EC tablet 81 mg  81 mg Oral Daily Ardis Hughs, MD   81 mg at 03/31/17 1715  . atorvastatin (LIPITOR) tablet 40 mg  40 mg Oral QPM Ardis Hughs, MD   40 mg at 03/31/17 1715  . bisoprolol (ZEBETA) tablet 2.5 mg  2.5 mg Oral QHS Ardis Hughs, MD   2.5 mg at 03/31/17 2119  . docusate sodium (COLACE) capsule 100 mg  100 mg Oral BID Ardis Hughs, MD   100 mg at 03/31/17 2119  . HYDROcodone-acetaminophen (NORCO) 10-325 MG per tablet 1-2 tablet  1-2 tablet Oral Q4H PRN Ardis Hughs, MD   2 tablet at 04/01/17 0443  . mometasone-formoterol (DULERA) 200-5 MCG/ACT inhaler 2 puff  2 puff Inhalation BID Ardis Hughs, MD      . nitroGLYCERIN (NITROSTAT) SL tablet 0.4 mg  0.4 mg Sublingual Q5 min PRN Ardis Hughs, MD      . ondansetron [facility]) tablet 4 mg  4 mg Oral Q6H PRN Ardis Hughs, MD       Or  . ondansetron [facility]) injection 4 mg  4 mg Intravenous Q6H PRN Ardis Hughs, MD      . pantoprazole (PROTONIX) EC tablet 20 mg  20 mg Oral Daily  Ardis Hughs, MD   20 mg at 03/31/17 1715  . piperacillin-tazobactam (ZOSYN) IVPB 3.375 g  3.375 g Intravenous Q8H Ardis Hughs, MD 12.5 mL/hr at 04/01/17 0508 3.375 g at 04/01/17 0508  . sodium chloride flush (NS) 0.9 % injection 3 mL  3 mL Intravenous Q12H Ardis Hughs, MD   3 mL at 03/31/17 2119    Physical Exam:  General: Patient is in no apparent distress Lungs: Normal respiratory effort, chest expands symmetrically. GI: The abdomen is soft with tenderness in the right lower quadrant/pelvis without mass. Foley catheter in place and draining completely clear urine.    Lab Results: Recent Labs     03/30/17 2340 04/01/17 0552  WBC 25.4* 20.1*  HGB 16.8 14.1  HCT 48.6 42.7   BMET Recent Labs    03/30/17 2340 04/01/17 0552  NA 135 137  K 3.9 3.9  CL 100* 106  CO2 24 25  GLUCOSE 128* 111*  BUN 24* 11  CREATININE 1.04 0.83  CALCIUM 9.3 8.7*   Recent Labs    03/30/17 2340  INR 0.96   No results for input(s): LABURIN in the last 72 hours. Results for orders placed or performed during the hospital encounter of 08/03/15  Urine culture     Status: None   Collection Time: 08/02/15 10:33 PM  Result Value Ref Range Status   Specimen Description URINE, RANDOM  Final   Special Requests NONE  Final   Culture 8,000 COLONIES/mL INSIGNIFICANT GROWTH  Final   Report Status 08/04/2015 FINAL  Final    Studies/Results: No results found.     Angelis Gates C 04/01/2017, 7:14 AM

## 2017-04-01 NOTE — Care Management Obs Status (Signed)
MEDICARE OBSERVATION STATUS NOTIFICATION   Patient Details  Name: CLAXTON LEVITZ MRN: [id number] Date of Birth: [birth year]   Medicare Observation Status Notification Given:  Yes    Leeroy Cha, RN 04/01/2017, 10:20 AM

## 2017-04-02 DIAGNOSIS — N390 Urinary tract infection, site not specified: Secondary | ICD-10-CM | POA: Diagnosis not present

## 2017-04-02 DIAGNOSIS — R310 Gross hematuria: Secondary | ICD-10-CM | POA: Diagnosis not present

## 2017-04-02 DIAGNOSIS — B9620 Unspecified Escherichia coli [E. coli] as the cause of diseases classified elsewhere: Secondary | ICD-10-CM | POA: Diagnosis not present

## 2017-04-02 LAB — URINE CULTURE: Culture: >=100000 — AB

## 2017-04-02 MED FILL — AMOX-CLAV 875-125 MG TABLET: 875-125 | 7 days supply | Qty: 14 | Fill #0

## 2017-04-02 MED FILL — oxyCODONE HCL 10 MG TABS: 10 | 2 days supply | Qty: 30 | Fill #0

## 2017-04-02 NOTE — Progress Notes (Signed)
Patient given discharge instructions, and verbalized an understanding of all discharge instructions.  Patient agrees with discharge plan, and is being discharged in stable medical condition.  Patient given transportation via wheelchair.  Patient had prescriptions for oxycodone and augmentin sent electronically by Dr. Karsten Ro, which did not show up on discharge avs.  Prescriptions were verified, with [facility].  Patient instructed to pick oxycodone prescription up at [place], and antibiotic at [place] [facility].

## 2017-04-02 NOTE — Discharge Summary (Signed)
Physician Discharge Summary  Patient ID: Timothy [name] MRN: [id number] DOB/AGE: 1955/05/08 61 y.o.  Admit date: 03/30/2017 Discharge date: 04/02/2017  Admission Diagnoses:  Discharge Diagnoses:  Active Problems:   Gross hematuria   Hematuria, gross   Bladder perforation E coli UTI  Discharged Condition: good  Hospital Course:  He underwent transurethral resection of a bladder tumor 3 days prior to his admission.  He then developed low-grade fever and pain in the right lower quadrant and presented to the emergency room.  A contrasted CT scan revealed extravasation from the bladder on the right-hand side consistent with an extraperitoneal bladder perforation.  He had maintained a Foley catheter since the time of his surgery.  There is no evidence of hydronephrosis.  A urine culture was performed and he was started on Zosyn.  His white blood cell count was noted to be elevated but began to fall and he had no further fever after the 1st 24 hr.  He was maintained on intravenous antibiotics until the results of his culture returned.  He grew extended spectrum E coli that was sensitive to the Zosyn.  It was also sensitive to Unasyn however the only oral agent that showed sensitivity was nitrofurantoin which is not indicated for soft tissue infection and therefore I contacted Dr. Talbot Grumbling in Infectious Disease and discussed the case with him.  He and I both felt that Augmentin would be a good alternative as an oral agent.  I will place him on this medication for 1 week.  He will be discharged with his Foley catheter indwelling as his pain has now diminished significantly.  He will then follow up with me in the office in approximately 2 weeks for a cystogram.  Consults: None   Discharge Exam: Blood pressure 110/64, pulse 98, temperature 98 F (36.7 C), temperature source Oral, resp. rate 18, height 5\' 6"  (1.676 m), weight 55.8 kg (123 lb), SpO2 93 %. Awake, alert and oriented.  In no apparent  distress.   Chest:  Normal respiratory effort.   Cardiovascular:  Regular rate and rhythm.   Abdomen:  Soft, minimally tender in the right lower quadrant without mass or peritoneal signs.   GU:  Foley catheter indwelling and draining clear urine.  Disposition: 01-Home or Self Care  Discharge Instructions    Discharge patient   Complete by:  As directed    Discharge disposition:  01-Home or Self Care   Discharge patient date:  04/02/2017     Allergies as of 04/02/2017      Reactions   Ace Inhibitors    Wheezing, prior notes   Aspirin Swelling   To lips.   Ibuprofen Swelling   To lips.   Losartan Other (See Comments)   Leg cramps      Medication List    TAKE these medications   acetaminophen 500 MG tablet Commonly known as:  TYLENOL Take 1,000 mg every 4 (four) hours as needed by mouth for mild pain.   albuterol (2.5 MG/3ML) 0.083% nebulizer solution Commonly known as:  PROVENTIL Take 3 mLs (2.5 mg total) by nebulization every 6 (six) hours as needed for wheezing or shortness of breath.   albuterol 108 (90 Base) MCG/ACT inhaler Commonly known as:  VENTOLIN HFA Inhale 2 puffs into the lungs every 6 (six) hours as needed for wheezing or shortness of breath.   aspirin EC 81 MG tablet Take 1 tablet (81 mg total) by mouth daily.   atorvastatin 40 MG tablet Commonly known  as:  LIPITOR Take 1 tablet (40 mg total) by mouth daily. What changed:  when to take this   bisoprolol 5 MG tablet Commonly known as:  ZEBETA Take 0.5 tablets (2.5 mg total) by mouth at bedtime.   budesonide-formoterol 160-4.5 MCG/ACT inhaler Commonly known as:  SYMBICORT Inhale 2 puffs 2 (two) times daily into the lungs.   HYDROcodone-acetaminophen 10-325 MG tablet Commonly known as:  NORCO Take 1-2 tablets by mouth every 4 (four) hours as needed for moderate pain. Maximum dose per 24 hours - 8 pills   nitroGLYCERIN 0.4 MG SL tablet Commonly known as:  NITROSTAT Place 1 tablet (0.4 mg total)  under the tongue every 5 (five) minutes as needed for chest pain. NEED OV.   pantoprazole 20 MG tablet Commonly known as:  PROTONIX Take 1 tablet (20 mg total) by mouth daily.   phenazopyridine 200 MG tablet Commonly known as:  PYRIDIUM Take 1 tablet (200 mg total) by mouth 3 (three) times daily as needed for pain.      Follow-up Information    Call Kathie Rhodes, MD.   Specialty:  Urology Why:   when you contact the office tell them that I wanted to reschedule your appointment from 04/07/17 to at least 1 week later and preferably 2 weeks to allow your bladder to heal. Contact information: [place] [address] [phone]           Signed: Claybon Jabs 04/02/2017, 11:05 AM

## 2017-04-02 NOTE — Care Management Note (Signed)
Case Management Note    Patient Details  Name: CELEDONIO SORTINO MRN: [id number] Date of Birth: October 10, 1955  Subjective/Objective:                  Bladder perforation s/p study  Action/Plan: Date: April 02, 2017 Velva Harman, BSN, [place], [place] Chart and notes review for patient progress and needs. Will follow for case management and discharge needs. Next review date: [id number]  Expected Discharge Date:  (unknown)               Expected Discharge Plan:  Home/Self Care  In-House Referral:     Discharge planning Services  CM Consult  Post Acute Care Choice:    Choice offered to:     DME Arranged:    DME Agency:     HH Arranged:    HH Agency:     Status of Service:  In process, will continue to follow  If discussed at Long Length of Stay Meetings, dates discussed:    Additional Comments:  Leeroy Cha, RN 04/02/2017, 10:44 AM

## 2017-04-02 NOTE — Progress Notes (Signed)
Date: April 02, 2017 Chart review for discharge needs:  None found for case management. Patient has no questions concerning post hospital care.

## 2017-04-05 LAB — CULTURE, BLOOD (ROUTINE X 2)
CULTURE: NO GROWTH
CULTURE: NO GROWTH
Special Requests: ADEQUATE
Special Requests: ADEQUATE

## 2017-04-08 ENCOUNTER — Ambulatory Visit: Payer: Medicare Other | Admitting: Cardiovascular Disease

## 2017-04-20 DIAGNOSIS — N13721 Vesicoureteral-reflux with reflux nephropathy without hydroureter, unilateral: Secondary | ICD-10-CM | POA: Diagnosis not present

## 2017-04-20 DIAGNOSIS — Z8551 Personal history of malignant neoplasm of bladder: Secondary | ICD-10-CM | POA: Diagnosis not present

## 2017-04-20 DIAGNOSIS — S3729XD Other injury of bladder, subsequent encounter: Secondary | ICD-10-CM | POA: Diagnosis not present

## 2017-04-20 MED FILL — !VENTOLIN HFA INHALER: 108 (90 BAS | 25 days supply | Qty: 18 | Fill #1

## 2017-04-20 MED FILL — ?ATORVASTATIN 40MG TABLET: 40 | 30 days supply | Qty: 30 | Fill #1

## 2017-04-20 MED FILL — PANTOPRAZOLE SOD DR 20 MG T: 20 | 30 days supply | Qty: 30 | Fill #1

## 2017-04-22 ENCOUNTER — Inpatient Hospital Stay (HOSPITAL_COMMUNITY)
Admission: EM | Admit: 2017-04-22 | Discharge: 2017-04-26 | DRG: 872 | Disposition: A | Discharge status: Home / Self Care | Payer: Medicare Other | Attending: Internal Medicine | Admitting: Internal Medicine

## 2017-04-22 ENCOUNTER — Emergency Department (HOSPITAL_COMMUNITY): Payer: Medicare Other

## 2017-04-22 ENCOUNTER — Encounter (HOSPITAL_COMMUNITY): Payer: Self-pay | Admitting: Emergency Medicine

## 2017-04-22 DIAGNOSIS — I2510 Atherosclerotic heart disease of native coronary artery without angina pectoris: Secondary | ICD-10-CM | POA: Diagnosis present

## 2017-04-22 DIAGNOSIS — I10 Essential (primary) hypertension: Secondary | ICD-10-CM | POA: Diagnosis not present

## 2017-04-22 DIAGNOSIS — Z87891 Personal history of nicotine dependence: Secondary | ICD-10-CM | POA: Diagnosis not present

## 2017-04-22 DIAGNOSIS — Z888 Allergy status to other drugs, medicaments and biological substances status: Secondary | ICD-10-CM

## 2017-04-22 DIAGNOSIS — N390 Urinary tract infection, site not specified: Secondary | ICD-10-CM | POA: Diagnosis not present

## 2017-04-22 DIAGNOSIS — I25118 Atherosclerotic heart disease of native coronary artery with other forms of angina pectoris: Secondary | ICD-10-CM | POA: Diagnosis not present

## 2017-04-22 DIAGNOSIS — J449 Chronic obstructive pulmonary disease, unspecified: Secondary | ICD-10-CM | POA: Diagnosis not present

## 2017-04-22 DIAGNOSIS — E785 Hyperlipidemia, unspecified: Secondary | ICD-10-CM | POA: Diagnosis present

## 2017-04-22 DIAGNOSIS — N10 Acute tubulo-interstitial nephritis: Secondary | ICD-10-CM | POA: Diagnosis present

## 2017-04-22 DIAGNOSIS — Z8551 Personal history of malignant neoplasm of bladder: Secondary | ICD-10-CM

## 2017-04-22 DIAGNOSIS — Z955 Presence of coronary angioplasty implant and graft: Secondary | ICD-10-CM

## 2017-04-22 DIAGNOSIS — I252 Old myocardial infarction: Secondary | ICD-10-CM | POA: Diagnosis not present

## 2017-04-22 DIAGNOSIS — R319 Hematuria, unspecified: Secondary | ICD-10-CM | POA: Diagnosis not present

## 2017-04-22 DIAGNOSIS — Z886 Allergy status to analgesic agent status: Secondary | ICD-10-CM | POA: Diagnosis not present

## 2017-04-22 DIAGNOSIS — R509 Fever, unspecified: Secondary | ICD-10-CM | POA: Diagnosis not present

## 2017-04-22 DIAGNOSIS — A419 Sepsis, unspecified organism: Principal | ICD-10-CM | POA: Diagnosis present

## 2017-04-22 DIAGNOSIS — D72829 Elevated white blood cell count, unspecified: Secondary | ICD-10-CM | POA: Diagnosis not present

## 2017-04-22 DIAGNOSIS — K219 Gastro-esophageal reflux disease without esophagitis: Secondary | ICD-10-CM | POA: Diagnosis present

## 2017-04-22 DIAGNOSIS — Z79899 Other long term (current) drug therapy: Secondary | ICD-10-CM

## 2017-04-22 DIAGNOSIS — J069 Acute upper respiratory infection, unspecified: Secondary | ICD-10-CM | POA: Diagnosis not present

## 2017-04-22 LAB — CBC WITH DIFFERENTIAL/PLATELET
BASOS ABS: 0 10*3/uL (ref 0.0–0.1)
Basophils Relative: 0 %
EOS PCT: 1 %
Eosinophils Absolute: 0.1 10*3/uL (ref 0.0–0.7)
HCT: 45.5 % (ref 39.0–52.0)
Hemoglobin: 15.7 g/dL (ref 13.0–17.0)
LYMPHS PCT: 6 %
Lymphs Abs: 1.4 10*3/uL (ref 0.7–4.0)
MCH: 31.2 pg (ref 26.0–34.0)
MCHC: 34.5 g/dL (ref 30.0–36.0)
MCV: 90.3 fL (ref 78.0–100.0)
Monocytes Absolute: 3.2 10*3/uL — ABNORMAL HIGH (ref 0.1–1.0)
Monocytes Relative: 13 %
NEUTROS PCT: 80 %
Neutro Abs: 19.4 10*3/uL — ABNORMAL HIGH (ref 1.7–7.7)
PLATELETS: 370 10*3/uL (ref 150–400)
RBC: 5.04 MIL/uL (ref 4.22–5.81)
RDW: 13.4 % (ref 11.5–15.5)
WBC: 24.1 10*3/uL — AB (ref 4.0–10.5)

## 2017-04-22 LAB — COMPREHENSIVE METABOLIC PANEL
ALT: 19 U/L (ref 17–63)
ANION GAP: 10 (ref 5–15)
AST: 21 U/L (ref 15–41)
Albumin: 3.9 g/dL (ref 3.5–5.0)
Alkaline Phosphatase: 94 U/L (ref 38–126)
BUN: 13 mg/dL (ref 6–20)
CHLORIDE: 101 mmol/L (ref 101–111)
CO2: 25 mmol/L (ref 22–32)
CREATININE: 0.89 mg/dL (ref 0.61–1.24)
Calcium: 9.2 mg/dL (ref 8.9–10.3)
GFR calc non Af Amer: >60 mL/min (ref >60)
Glucose, Bld: 118 mg/dL — ABNORMAL HIGH (ref 65–99)
POTASSIUM: 3.7 mmol/L (ref 3.5–5.1)
Sodium: 136 mmol/L (ref 135–145)
Total Bilirubin: 1 mg/dL (ref 0.3–1.2)
Total Protein: 7.7 g/dL (ref 6.5–8.1)

## 2017-04-22 LAB — PROTIME-INR
INR: 0.9
PROTHROMBIN TIME: 12.1 s (ref 11.4–15.2)

## 2017-04-22 LAB — URINALYSIS, ROUTINE W REFLEX MICROSCOPIC
Bilirubin Urine: NEGATIVE
GLUCOSE, UA: NEGATIVE mg/dL
Ketones, ur: NEGATIVE mg/dL
NITRITE: POSITIVE — AB
PH: 8 (ref 5.0–8.0)
Protein, ur: 30 mg/dL — AB
Specific Gravity, Urine: 1.015 (ref 1.005–1.030)

## 2017-04-22 LAB — INFLUENZA PANEL BY PCR (TYPE A & B)
INFLBPCR: NEGATIVE
Influenza A By PCR: NEGATIVE

## 2017-04-22 LAB — I-STAT CG4 LACTIC ACID, ED: LACTIC ACID, VENOUS: 1.54 mmol/L (ref 0.5–1.9)

## 2017-04-22 MED ORDER — SODIUM CHLORIDE 0.9 % IV BOLUS (SEPSIS)
1000.0000 mL | Freq: Once | INTRAVENOUS | Status: AC
  Administered 2017-04-22: 1000 mL via INTRAVENOUS

## 2017-04-22 MED ORDER — DEXTROSE 5 % IV SOLN
2.0000 g | Freq: Once | INTRAVENOUS | Status: AC
  Administered 2017-04-22: 2 g via INTRAVENOUS
  Filled 2017-04-22: qty 2

## 2017-04-22 MED ORDER — SODIUM CHLORIDE 0.9 % IV BOLUS (SEPSIS)
250.0000 mL | Freq: Once | INTRAVENOUS | Status: AC
  Administered 2017-04-22: 250 mL via INTRAVENOUS

## 2017-04-22 MED ORDER — ONDANSETRON HCL 4 MG/2ML IJ SOLN
4.0000 mg | Freq: Once | INTRAMUSCULAR | Status: AC
  Administered 2017-04-22: 4 mg via INTRAVENOUS
  Filled 2017-04-22: qty 2

## 2017-04-22 MED ORDER — ACETAMINOPHEN 325 MG PO TABS
650.0000 mg | ORAL_TABLET | Freq: Once | ORAL | Status: AC
  Administered 2017-04-22: 650 mg via ORAL
  Filled 2017-04-22: qty 2

## 2017-04-22 MED ORDER — SODIUM CHLORIDE 0.9 % IV BOLUS (SEPSIS): 500.0000 mL | Freq: Once | INTRAVENOUS | Status: DC

## 2017-04-22 NOTE — ED Provider Notes (Signed)
[facility] DEPT Provider Note   CSN: [id number] Arrival date & time: 04/22/17  1920     History   Chief Complaint Chief Complaint  Patient presents with  . Fever    HPI EILEEN CROSWELL is a 61 y.o. male.  61 year old male presents with 1 day history of fever, weakness.  Patient seen last month after having a bowel perforation from a TURP procedure.  Had been on Augmentin for that.  States that his Foley catheter was removed 2 days ago and he felt fine up until today.  Has had no cough or congestion.  Has had some dysuria.  No flank pain.  Denies any abdominal pain.  No diarrhea.  No sick exposures.  Symptoms persisted throughout the day and associated myalgias.  Nothing makes them better.  No treatment used prior to arrival      Past Medical History:  Diagnosis Date  . Altered mental status, improved at discharge 06/19/2013  . Bronchitis, chronic ([place]) 06/19/2013  . Cancer ([place])    bladder  . Cardiac arrest, hypothermic protocol 06/12/2013  . COPD exacerbation ([place]) 06/12/2013  . GERD (gastroesophageal reflux disease)   . Hyperlipidemia LDL goal < 70 06/19/2013  . Hypokalemia, replaced 06/19/2013  . Hypomagnesemia, replaced 06/19/2013  . Hypotension, requiring levophed 06/19/2013  . NSTEMI (non-ST elevated myocardial infarction), DES to RCA 06/13/2013  . Pre-diabetes   . Tobacco abuse 06/19/2013    Patient Active Problem List   Diagnosis Date Noted  . Hematuria, gross 04/01/2017  . Immunization due 02/23/2017  . Malignant neoplasm of urinary bladder ([place]) 02/23/2017  . Gross hematuria 11/20/2016  . Lung nodule 11/20/2016  . Gastroesophageal reflux disease without esophagitis 11/20/2016  . Old MI (myocardial infarction) 01/19/2015  . Prediabetes 12/29/2013  . Dental cavities 12/29/2013  . HTN (hypertension) 08/03/2013  . CAD (coronary artery disease) 06/19/2013  . Hyperlipidemia with target LDL less than 70 06/19/2013  . Tobacco abuse  06/19/2013  . NSTEMI (non-ST elevated myocardial infarction), DES to RCA 06/13/2013  . Cardiac arrest, hypothermic protocol, vfib/vtach.  Resp arrest 06/12/2013  . VT (ventricular tachycardia), requiring 2 shocks 06/12/2013  . COPD GOLD III 06/12/2013    Past Surgical History:  Procedure Laterality Date  . cardiac sstent    . CORONARY ANGIOPLASTY WITH STENT PLACEMENT  06/12/13   [employer] [place] DES to RCA, NSTEMI  . CYSTOSCOPY W/ RETROGRADES Left 03/29/2017   Procedure: CYSTOSCOPY WITH LEFT RETROGRADE PYELOGRAM;  Surgeon: Kathie Rhodes, MD;  Location: [facility] ORS;  Service: Urology;  Laterality: Left;  . LEFT HEART CATHETERIZATION WITH CORONARY ANGIOGRAM N/A 06/12/2013   Procedure: LEFT HEART CATHETERIZATION WITH CORONARY ANGIOGRAM;  Surgeon: Troy Sine, MD;  Location: [facility] CATH LAB;  Service: Cardiovascular;  Laterality: N/A;  . NO PAST SURGERIES    . TRANSURETHRAL RESECTION OF BLADDER TUMOR N/A 03/29/2017   Procedure: TRANSURETHRAL RESECTION OF BLADDER TUMOR (TURBT)/;  Surgeon: Kathie Rhodes, MD;  Location: [facility] ORS;  Service: Urology;  Laterality: N/A;  . transurethral ressection of bladder tumor     Dr. Karsten Ro 03/29/17       Home Medications    Prior to Admission medications   Medication Sig Start Date End Date Taking? Authorizing Provider  acetaminophen (TYLENOL) 500 MG tablet Take 1,000 mg every 4 (four) hours as needed by mouth for mild pain.    [provider]  albuterol (PROVENTIL) (2.5 MG/3ML) 0.083% nebulizer solution Take 3 mLs (2.5 mg total) by nebulization every 6 (six) hours  as needed for wheezing or shortness of breath. 11/20/16   Ladell Pier, MD  albuterol (VENTOLIN HFA) 108 (90 Base) MCG/ACT inhaler Inhale 2 puffs into the lungs every 6 (six) hours as needed for wheezing or shortness of breath. 02/23/17   Ladell Pier, MD  aspirin EC 81 MG tablet Take 1 tablet (81 mg total) by mouth daily. 10/21/15   Maren Reamer, MD  atorvastatin (LIPITOR) 40 MG  tablet Take 1 tablet (40 mg total) by mouth daily. Patient taking differently: Take 40 mg every evening by mouth.  02/23/17   Ladell Pier, MD  bisoprolol (ZEBETA) 5 MG tablet Take 0.5 tablets (2.5 mg total) by mouth at bedtime. 02/23/17   Ladell Pier, MD  budesonide-formoterol (SYMBICORT) 160-4.5 MCG/ACT inhaler Inhale 2 puffs 2 (two) times daily into the lungs.    [provider]  HYDROcodone-acetaminophen (NORCO) 10-325 MG tablet Take 1-2 tablets by mouth every 4 (four) hours as needed for moderate pain. Maximum dose per 24 hours - 8 pills 03/29/17   Kathie Rhodes, MD  nitroGLYCERIN (NITROSTAT) 0.4 MG SL tablet Place 1 tablet (0.4 mg total) under the tongue every 5 (five) minutes as needed for chest pain. NEED OV. 11/20/16   Ladell Pier, MD  pantoprazole (PROTONIX) 20 MG tablet Take 1 tablet (20 mg total) by mouth daily. 02/23/17   Ladell Pier, MD  phenazopyridine (PYRIDIUM) 200 MG tablet Take 1 tablet (200 mg total) by mouth 3 (three) times daily as needed for pain. 03/29/17   Kathie Rhodes, MD    Family History No family history on file.  Social History Social History   Tobacco Use  . Smoking status: Former Smoker    Packs/day: 2.00    Years: 38.00    Pack years: 76.00    Types: Cigarettes    Last attempt to quit: 07/10/2008    Years since quitting: 8.7  . Smokeless tobacco: Never Used  Substance Use Topics  . Alcohol use: No  . Drug use: No     Allergies   Ace inhibitors; Aspirin; Ibuprofen; and Losartan   Review of Systems Review of Systems  All other systems reviewed and are negative.    Physical Exam Updated Vital Signs BP (!) 153/78 (BP Location: Left Arm)   Pulse (!) 122   Temp (!) 102.9 F (39.4 C) (Oral)   Resp 20   Ht 1.676 m (5\' 6" )   Wt 55.8 kg (123 lb)   SpO2 91%   BMI 19.85 kg/m   Physical Exam  Constitutional: He is oriented to person, place, and time. He appears lethargic. He appears cachectic. He has a sickly  appearance. He appears ill.  HENT:  Head: Normocephalic and atraumatic.  Eyes: Conjunctivae, EOM and lids are normal. Pupils are equal, round, and reactive to light.  Neck: Normal range of motion. Neck supple. No tracheal deviation present. No thyroid mass present.  Cardiovascular: Normal rate, regular rhythm and normal heart sounds. Exam reveals no gallop.  No murmur heard. Pulmonary/Chest: Effort normal and breath sounds normal. No stridor. No respiratory distress. He has no decreased breath sounds. He has no wheezes. He has no rhonchi. He has no rales.  Abdominal: Soft. Normal appearance and bowel sounds are normal. He exhibits no distension. There is no tenderness. There is no rigidity, no rebound, no guarding and no CVA tenderness.  Musculoskeletal: Normal range of motion. He exhibits no edema or tenderness.  Neurological: He is oriented to person, place,  and time. He appears lethargic. He displays atrophy. No cranial nerve deficit or sensory deficit. GCS eye subscore is 4. GCS verbal subscore is 5. GCS motor subscore is 6.  Skin: Skin is warm and dry. No abrasion and no rash noted.  Psychiatric: He has a normal mood and affect. His speech is normal and behavior is normal.  Nursing note and vitals reviewed.    ED Treatments / Results  Labs (all labs ordered are listed, but only abnormal results are displayed) Labs Reviewed  CULTURE, BLOOD (ROUTINE X 2)  CULTURE, BLOOD (ROUTINE X 2)  COMPREHENSIVE METABOLIC PANEL  CBC WITH DIFFERENTIAL/PLATELET  PROTIME-INR  URINALYSIS, ROUTINE W REFLEX MICROSCOPIC  INFLUENZA PANEL BY PCR (TYPE A & B)  I-STAT CG4 LACTIC ACID, ED  I-STAT CG4 LACTIC ACID, ED    EKG  EKG Interpretation None       Radiology No results found.  Procedures Procedures (including critical care time)  Medications Ordered in ED Medications  sodium chloride 0.9 % bolus 1,000 mL (not administered)    And  sodium chloride 0.9 % bolus 500 mL (not administered)      And  sodium chloride 0.9 % bolus 250 mL (not administered)  ceFEPIme (MAXIPIME) 2 g in dextrose 5 % 50 mL IVPB (not administered)  acetaminophen (TYLENOL) tablet 650 mg (650 mg Oral Given 04/22/17 2000)     Initial Impression / Assessment and Plan / ED Course  I have reviewed the triage vital signs and the nursing notes.  Pertinent labs & imaging results that were available during my care of the patient were reviewed by me and considered in my medical decision making (see chart for details).     Code sepsis initiated and patient started on IV fluids and antibiotics.  Urinalysis shows infection and CT consistent with pyelonephritis. cbc with significant leukocytosis.  Will admit to medicine service  Final Clinical Impressions(s) / ED Diagnoses   Final diagnoses:  None    ED Discharge Orders    None       Lacretia Leigh, MD 04/22/17 2258

## 2017-04-22 NOTE — Progress Notes (Signed)
A consult was received from an ED physician for cefepime per pharmacy dosing.  The patient's profile has been reviewed for ht/wt/allergies/indication/available labs.    A one time order has been placed for cefepime 2gm IV x1.  Further antibiotics/pharmacy consults should be ordered by admitting physician if indicated.                       Thank you, Lynelle Doctor 04/22/2017  8:20 PM

## 2017-04-22 NOTE — ED Triage Notes (Signed)
Patient presets with fever onset of today. Pt had surgery about 2 weeks ago for bladder cancer. Had follow-up visit Tuesday with no c/o. Woke up feeling achy with fever of 101.5 today. Nauseated during triage. Pt warm to touch.

## 2017-04-23 ENCOUNTER — Other Ambulatory Visit: Payer: Self-pay

## 2017-04-23 ENCOUNTER — Encounter (HOSPITAL_COMMUNITY): Payer: Self-pay | Admitting: Internal Medicine

## 2017-04-23 DIAGNOSIS — N390 Urinary tract infection, site not specified: Secondary | ICD-10-CM | POA: Diagnosis present

## 2017-04-23 DIAGNOSIS — A419 Sepsis, unspecified organism: Principal | ICD-10-CM

## 2017-04-23 LAB — COMPREHENSIVE METABOLIC PANEL
ALT: 26 U/L (ref 17–63)
AST: 26 U/L (ref 15–41)
Albumin: 3.3 g/dL — ABNORMAL LOW (ref 3.5–5.0)
Alkaline Phosphatase: 83 U/L (ref 38–126)
Anion gap: 7 (ref 5–15)
BUN: 13 mg/dL (ref 6–20)
CHLORIDE: 105 mmol/L (ref 101–111)
CO2: 26 mmol/L (ref 22–32)
CREATININE: 0.98 mg/dL (ref 0.61–1.24)
Calcium: 8.5 mg/dL — ABNORMAL LOW (ref 8.9–10.3)
GFR calc non Af Amer: >60 mL/min (ref >60)
Glucose, Bld: 117 mg/dL — ABNORMAL HIGH (ref 65–99)
Potassium: 3.9 mmol/L (ref 3.5–5.1)
SODIUM: 138 mmol/L (ref 135–145)
Total Bilirubin: 0.8 mg/dL (ref 0.3–1.2)
Total Protein: 6.7 g/dL (ref 6.5–8.1)

## 2017-04-23 LAB — CBC WITH DIFFERENTIAL/PLATELET
BASOS ABS: 0 10*3/uL (ref 0.0–0.1)
BASOS PCT: 0 %
EOS ABS: 0 10*3/uL (ref 0.0–0.7)
EOS PCT: 0 %
HCT: 41.1 % (ref 39.0–52.0)
Hemoglobin: 13.5 g/dL (ref 13.0–17.0)
Lymphocytes Relative: 11 %
Lymphs Abs: 3.1 10*3/uL (ref 0.7–4.0)
MCH: 30 pg (ref 26.0–34.0)
MCHC: 32.8 g/dL (ref 30.0–36.0)
MCV: 91.3 fL (ref 78.0–100.0)
Monocytes Absolute: 2.2 10*3/uL — ABNORMAL HIGH (ref 0.1–1.0)
Monocytes Relative: 8 %
Neutro Abs: 23 10*3/uL — ABNORMAL HIGH (ref 1.7–7.7)
Neutrophils Relative %: 81 %
PLATELETS: 321 10*3/uL (ref 150–400)
RBC: 4.5 MIL/uL (ref 4.22–5.81)
RDW: 13.4 % (ref 11.5–15.5)
WBC: 28.3 10*3/uL — AB (ref 4.0–10.5)

## 2017-04-23 LAB — PROCALCITONIN: Procalcitonin: 0.89 ng/mL

## 2017-04-23 LAB — LACTIC ACID, PLASMA
LACTIC ACID, VENOUS: 0.8 mmol/L (ref 0.5–1.9)
Lactic Acid, Venous: 1.1 mmol/L (ref 0.5–1.9)

## 2017-04-23 LAB — MRSA PCR SCREENING: MRSA by PCR: NEGATIVE

## 2017-04-23 LAB — PROTIME-INR
INR: 0.99
Prothrombin Time: 13 seconds (ref 11.4–15.2)

## 2017-04-23 LAB — CREATININE, SERUM
Creatinine, Ser: 0.91 mg/dL (ref 0.61–1.24)
GFR calc non Af Amer: >60 mL/min (ref >60)

## 2017-04-23 LAB — APTT: APTT: 29 s (ref 24–36)

## 2017-04-23 MED ORDER — SODIUM CHLORIDE 0.9 % IV SOLN
INTRAVENOUS | Status: DC
  Administered 2017-04-23 (×2): via INTRAVENOUS

## 2017-04-23 MED ORDER — ACETAMINOPHEN 650 MG RE SUPP: 650.0000 mg | Freq: Four times a day (QID) | RECTAL | Status: DC | PRN

## 2017-04-23 MED ORDER — MOMETASONE FURO-FORMOTEROL FUM 200-5 MCG/ACT IN AERO
2.0000 | INHALATION_SPRAY | Freq: Two times a day (BID) | RESPIRATORY_TRACT | Status: DC
  Administered 2017-04-23 – 2017-04-26 (×7): 2 via RESPIRATORY_TRACT
  Filled 2017-04-23: qty 8.8

## 2017-04-23 MED ORDER — DEXTROSE 5 % IV SOLN
1.0000 g | Freq: Three times a day (TID) | INTRAVENOUS | Status: DC
  Administered 2017-04-23: 1 g via INTRAVENOUS
  Filled 2017-04-23 (×2): qty 1

## 2017-04-23 MED ORDER — HYDROCODONE-ACETAMINOPHEN 10-325 MG PO TABS
1.0000 | ORAL_TABLET | ORAL | Status: DC | PRN
  Administered 2017-04-24: 1 via ORAL
  Filled 2017-04-23 (×2): qty 1

## 2017-04-23 MED ORDER — ONDANSETRON HCL 4 MG/2ML IJ SOLN
4.0000 mg | Freq: Four times a day (QID) | INTRAMUSCULAR | Status: DC | PRN
  Administered 2017-04-23: 4 mg via INTRAVENOUS
  Filled 2017-04-23 (×3): qty 2

## 2017-04-23 MED ORDER — ENOXAPARIN SODIUM 40 MG/0.4ML ~~LOC~~ SOLN
40.0000 mg | Freq: Every day | SUBCUTANEOUS | Status: DC
  Administered 2017-04-23: 40 mg via SUBCUTANEOUS
  Filled 2017-04-23 (×4): qty 0.4

## 2017-04-23 MED ORDER — BISOPROLOL FUMARATE 5 MG PO TABS
2.5000 mg | ORAL_TABLET | Freq: Every day | ORAL | Status: DC
  Administered 2017-04-23 – 2017-04-25 (×3): 2.5 mg via ORAL
  Filled 2017-04-23 (×4): qty 1

## 2017-04-23 MED ORDER — SODIUM CHLORIDE 0.9 % IV SOLN
1.0000 g | Freq: Three times a day (TID) | INTRAVENOUS | Status: DC
  Administered 2017-04-23 – 2017-04-26 (×11): 1 g via INTRAVENOUS
  Filled 2017-04-23 (×13): qty 1

## 2017-04-23 MED ORDER — ALBUTEROL SULFATE (2.5 MG/3ML) 0.083% IN NEBU: 2.5000 mg | INHALATION_SOLUTION | Freq: Four times a day (QID) | RESPIRATORY_TRACT | Status: DC | PRN

## 2017-04-23 MED ORDER — NITROGLYCERIN 0.4 MG SL SUBL: 0.4000 mg | SUBLINGUAL_TABLET | SUBLINGUAL | Status: DC | PRN

## 2017-04-23 MED ORDER — ONDANSETRON HCL 4 MG PO TABS: 4.0000 mg | ORAL_TABLET | Freq: Four times a day (QID) | ORAL | Status: DC | PRN

## 2017-04-23 MED ORDER — ACETAMINOPHEN 325 MG PO TABS: 650.0000 mg | ORAL_TABLET | Freq: Four times a day (QID) | ORAL | Status: DC | PRN

## 2017-04-23 MED ORDER — ASPIRIN EC 81 MG PO TBEC
81.0000 mg | DELAYED_RELEASE_TABLET | Freq: Every day | ORAL | Status: DC
  Administered 2017-04-24 – 2017-04-26 (×3): 81 mg via ORAL
  Filled 2017-04-23 (×3): qty 1

## 2017-04-23 MED ORDER — ATORVASTATIN CALCIUM 40 MG PO TABS
40.0000 mg | ORAL_TABLET | Freq: Every evening | ORAL | Status: DC
  Administered 2017-04-24 – 2017-04-25 (×2): 40 mg via ORAL
  Filled 2017-04-23 (×3): qty 1

## 2017-04-23 MED ORDER — PANTOPRAZOLE SODIUM 20 MG PO TBEC
20.0000 mg | DELAYED_RELEASE_TABLET | Freq: Every day | ORAL | Status: DC
  Administered 2017-04-24 – 2017-04-26 (×3): 20 mg via ORAL
  Filled 2017-04-23 (×4): qty 1

## 2017-04-23 MED ORDER — PROMETHAZINE HCL 25 MG/ML IJ SOLN
12.5000 mg | Freq: Four times a day (QID) | INTRAMUSCULAR | Status: DC | PRN
  Administered 2017-04-23: 12.5 mg via INTRAVENOUS
  Filled 2017-04-23: qty 1

## 2017-04-23 NOTE — Plan of Care (Signed)
  Progressing Health Behavior/Discharge Planning: Ability to manage health-related needs will improve 04/23/2017 2121 - Progressing by Orma Render, RN Clinical Measurements: Diagnostic test results will improve 04/23/2017 2121 - Progressing by Orma Render, RN Elimination: Will not experience complications related to urinary retention 04/23/2017 2121 - Progressing by Orma Render, RN Pain Managment: General experience of comfort will improve 04/23/2017 2121 - Progressing by Icela Glymph, Coralee Pesa, RN

## 2017-04-23 NOTE — Plan of Care (Signed)
  Progressing Education: Knowledge of General Education information will improve 04/23/2017 0319 - Progressing by Orma Render, RN Clinical Measurements: Ability to maintain clinical measurements within normal limits will improve 04/23/2017 0319 - Progressing by Orma Render, RN Cardiovascular complication will be avoided 04/23/2017 0319 - Progressing by Orma Render, RN Activity: Risk for activity intolerance will decrease 04/23/2017 0319 - Progressing by Orma Render, RN Coping: Level of anxiety will decrease 04/23/2017 0319 - Progressing by Orma Render, RN Safety: Ability to remain free from injury will improve 04/23/2017 0319 - Progressing by Orma Render, RN Skin Integrity: Risk for impaired skin integrity will decrease 04/23/2017 0319 - Progressing by Orma Render, RN

## 2017-04-23 NOTE — Progress Notes (Signed)
Pharmacy Antibiotic Note  Timothy [name] is a 61 y.o. male admitted on 04/22/2017 with UTI.  Pharmacy has been consulted for Cefepime dosing.  Plan: Cefepime 2gm iv x1, then 1gm iv q8hr  Height: 5\' 6"  (167.6 cm) Weight: 126 lb 12.2 oz (57.5 kg) IBW/kg (Calculated) : 63.8  Temp (24hrs), Avg:100.7 F (38.2 C), Min:99.8 F (37.7 C), Max:102.9 F (39.4 C)  Recent Labs  Lab 04/22/17 1951 04/22/17 1957  WBC 24.1*  --   CREATININE 0.89  --   LATICACIDVEN  --  1.54    Estimated Creatinine Clearance: 71.8 mL/min (by C-G formula based on SCr of 0.89 mg/dL).    Allergies  Allergen Reactions  . Ace Inhibitors     Wheezing, prior notes  . Aspirin Swelling    To lips.  . Ibuprofen Swelling    To lips.  . Losartan Other (See Comments)    Leg cramps    Antimicrobials this admission: Cefepime 04/22/2017 >>  Dose adjustments this admission: -  Microbiology results: pending  Thank you for allowing pharmacy to be a part of this patient's care.  Nani Skillern [place] 04/23/2017 12:49 AM

## 2017-04-23 NOTE — Progress Notes (Signed)
Pharmacy Antibiotic Note  Timothy [name] is a 61 y.o. male admitted on 04/22/2017 with UTI, ESBL infection.  Pharmacy has been consulted for Meropenem dosing.  Plan: Meropenem 1gm iv q8hr  Height: 5\' 6"  (167.6 cm) Weight: 126 lb 12.2 oz (57.5 kg) IBW/kg (Calculated) : 63.8  Temp (24hrs), Avg:100.7 F (38.2 C), Min:99.8 F (37.7 C), Max:102.9 F (39.4 C)  Recent Labs  Lab 04/22/17 1951 04/22/17 1957 04/23/17 0104 04/23/17 0310  WBC 24.1*  --  28.3*  --   CREATININE 0.89  --  0.98  0.91  --   LATICACIDVEN  --  1.54 1.1 0.8    Estimated Creatinine Clearance: 65.2 mL/min (by C-G formula based on SCr of 0.98 mg/dL).    Allergies  Allergen Reactions  . Ace Inhibitors     Wheezing, prior notes  . Aspirin Swelling    To lips.  . Ibuprofen Swelling    To lips.  . Losartan Other (See Comments)    Leg cramps    Antimicrobials this admission: Cefepime 04/22/2017 >> 04/23/2017 Meropenem 04/23/2017 >>  Dose adjustments this admission: -  Microbiology results: pendng  Thank you for allowing pharmacy to be a part of this patient's care.  Nani Skillern Crowford 04/23/2017 6:20 AM

## 2017-04-23 NOTE — Progress Notes (Addendum)
Subjective: Patient admitted this morning, see detailed H&P by Dr Hal Hope.   61 y.o. male with history of CAD status post stenting, COPD, recently had transurethral resection of bladder tumor followed by perforation and had been on Foley catheter and antibiotics urine culture grew E. coli ESBL and was placed on Augmentin and had followed up with [place] urologist 3 days ago and had Foley catheter removed following which patient started developing fever chills and fatigue and weakness In the ER patient is found to be tachycardic with fever of 102 F and blood work showed leukocytosis.  UA is consistent with a UTI  Patient started on meropenem per pharmacy consultation. This morning patient feels better, no fever. Temperature 98.6.   Vitals:   04/23/17 0900 04/23/17 1000  BP:  (!) 119/58  Pulse: (!) 109 (!) 106  Resp: 10 (!) 29  Temp:    SpO2: 96% 95%      A/P  Sepsis secondary to pyelonephritis History of CAD COPD History of recent transurethral resection of bladder tumor, complicated with perforation  Urine culture not obtained in the ED, will obtain urine culture.  Continue meropenem. Follow blood culture results.    [place] [facility] [phone]

## 2017-04-23 NOTE — Care Management Note (Signed)
Case Management Note  Patient Details  Name: Timothy [name] MRN: [id number] Date of Birth: 11-26-1955  Subjective/Objective:                  S/p urinary surgery x 3 weeks ago now with uti fevers and elevated wbc  Action/Plan: Date: April 23, 2017 Velva Harman, BSN, [place], [place] Chart and notes review for patient progress and needs. Will follow for case management and discharge needs. Next review date: [id number]  Expected Discharge Date:                  Expected Discharge Plan:  Home/Self Care  In-House Referral:     Discharge planning Services  CM Consult  Post Acute Care Choice:    Choice offered to:     DME Arranged:    DME Agency:     HH Arranged:    HH Agency:     Status of Service:  In process, will continue to follow  If discussed at Long Length of Stay Meetings, dates discussed:    Additional Comments:  Leeroy Cha, RN 04/23/2017, 8:00 AM

## 2017-04-23 NOTE — H&P (Signed)
History and Physical    Timothy [name]:[id number] DOB: [birth year] DOA: 04/22/2017  PCP: Ladell Pier, MD  Patient coming from: Home.  Chief Complaint: Fever.  HPI: Timothy [name] is a 61 y.o. male with history of CAD status post stenting, COPD, recently had transurethral resection of bladder tumor followed by perforation and had been on Foley catheter and antibiotics urine culture grew E. coli ESBL and was placed on Augmentin and had followed up with [place] urologist 3 days ago and had Foley catheter removed following which patient started developing fever chills and fatigue and weakness.  Denies any chest pain shortness of breath productive cough.  Has been having some dysuria denies any obvious hematuria.  Denies any diarrhea.  Due to persistent symptoms patient came to the ER.  ED Course: In the ER patient is found to be tachycardic with fever of 102 F and blood work showed leukocytosis.  UA is consistent with a UTI.  Patient had some nausea.  Flu panel was negative.  Chest x-ray was not showing anything acute.  CT renal study shows right perinephric and peri-ureteric stranding.  There are also some perivascular stranding but extraluminal air had resolved.  Patient was started on empiric antibiotics after cultures were obtained and fluids were given per sepsis protocol.  Review of Systems: As per HPI, rest all negative.   Past Medical History:  Diagnosis Date  . Altered mental status, improved at discharge 06/19/2013  . Bronchitis, chronic ([place]) 06/19/2013  . Cancer ([place])    bladder  . Cardiac arrest, hypothermic protocol 06/12/2013  . COPD exacerbation ([place]) 06/12/2013  . GERD (gastroesophageal reflux disease)   . Hyperlipidemia LDL goal < 70 06/19/2013  . Hypokalemia, replaced 06/19/2013  . Hypomagnesemia, replaced 06/19/2013  . Hypotension, requiring levophed 06/19/2013  . NSTEMI (non-ST elevated myocardial infarction), DES to RCA 06/13/2013  . Pre-diabetes   . Tobacco abuse  06/19/2013    Past Surgical History:  Procedure Laterality Date  . cardiac sstent    . CORONARY ANGIOPLASTY WITH STENT PLACEMENT  06/12/13   [facility] [place] DES to RCA, NSTEMI  . CYSTOSCOPY W/ RETROGRADES Left 03/29/2017   Procedure: CYSTOSCOPY WITH LEFT RETROGRADE PYELOGRAM;  Surgeon: Kathie Rhodes, MD;  Location: [facility];  Service: Urology;  Laterality: Left;  . LEFT HEART CATHETERIZATION WITH CORONARY ANGIOGRAM N/A 06/12/2013   Procedure: LEFT HEART CATHETERIZATION WITH CORONARY ANGIOGRAM;  Surgeon: Troy Sine, MD;  Location: [facility] CATH LAB;  Service: Cardiovascular;  Laterality: N/A;  . NO PAST SURGERIES    . TRANSURETHRAL RESECTION OF BLADDER TUMOR N/A 03/29/2017   Procedure: TRANSURETHRAL RESECTION OF BLADDER TUMOR (TURBT)/;  Surgeon: Kathie Rhodes, MD;  Location: [facility] ORS;  Service: Urology;  Laterality: N/A;  . transurethral ressection of bladder tumor     Dr. Karsten Ro 03/29/17     reports that he quit smoking about 8 years ago. His smoking use included cigarettes. He has a 76.00 pack-year smoking history. he has never used smokeless tobacco. He reports that he does not drink alcohol or use drugs.  Allergies  Allergen Reactions  . Ace Inhibitors     Wheezing, prior notes  . Aspirin Swelling    To lips.  . Ibuprofen Swelling    To lips.  . Losartan Other (See Comments)    Leg cramps    Family History  Problem Relation Age of Onset  . Hypertension Other     Prior to Admission medications   Medication Sig Start Date End  Date Taking? Authorizing Provider  acetaminophen (TYLENOL) 500 MG tablet Take 1,000 mg every 4 (four) hours as needed by mouth for mild pain.   Yes [provider]  albuterol (PROVENTIL) (2.5 MG/3ML) 0.083% nebulizer solution Take 3 mLs (2.5 mg total) by nebulization every 6 (six) hours as needed for wheezing or shortness of breath. 11/20/16  Yes Ladell Pier, MD  albuterol (VENTOLIN HFA) 108 (90 Base) MCG/ACT inhaler Inhale 2 puffs into the lungs  every 6 (six) hours as needed for wheezing or shortness of breath. 02/23/17  Yes Ladell Pier, MD  aspirin EC 81 MG tablet Take 1 tablet (81 mg total) by mouth daily. 10/21/15  Yes Langeland, Dawn T, MD  atorvastatin (LIPITOR) 40 MG tablet Take 1 tablet (40 mg total) by mouth daily. Patient taking differently: Take 40 mg every evening by mouth.  02/23/17  Yes Ladell Pier, MD  bisoprolol (ZEBETA) 5 MG tablet Take 0.5 tablets (2.5 mg total) by mouth at bedtime. 02/23/17  Yes Ladell Pier, MD  budesonide-formoterol [facility]) 160-4.5 MCG/ACT inhaler Inhale 2 puffs 2 (two) times daily into the lungs.   Yes [provider]  HYDROcodone-acetaminophen (NORCO) 10-325 MG tablet Take 1-2 tablets by mouth every 4 (four) hours as needed for moderate pain. Maximum dose per 24 hours - 8 pills 03/29/17  Yes Kathie Rhodes, MD  nitroGLYCERIN (NITROSTAT) 0.4 MG SL tablet Place 1 tablet (0.4 mg total) under the tongue every 5 (five) minutes as needed for chest pain. NEED OV. 11/20/16  Yes Ladell Pier, MD  Oxycodone HCl 10 MG TABS Take 10 mg by mouth daily as needed. 04/02/17  Yes [provider]  pantoprazole (PROTONIX) 20 MG tablet Take 1 tablet (20 mg total) by mouth daily. 02/23/17  Yes Ladell Pier, MD  phenazopyridine (PYRIDIUM) 200 MG tablet Take 1 tablet (200 mg total) by mouth 3 (three) times daily as needed for pain. 03/29/17  Yes Kathie Rhodes, MD    Physical Exam: Vitals:   04/22/17 2230 04/22/17 2300 04/22/17 2327 04/23/17 0019  BP: 109/70 117/69  123/77  Pulse: (!) 108 (!) 116  99  Resp: 11   12  Temp:   99.8 F (37.7 C) 99.9 F (37.7 C)  TempSrc:   Oral Oral  SpO2: 91% 90%  97%  Weight:    57.5 kg (126 lb 12.2 oz)  Height:    5\' 6"  (1.676 m)      Constitutional: Moderately built and nourished. Vitals:   04/22/17 2230 04/22/17 2300 04/22/17 2327 04/23/17 0019  BP: 109/70 117/69  123/77  Pulse: (!) 108 (!) 116  99  Resp: 11   12  Temp:    99.8 F (37.7 C) 99.9 F (37.7 C)  TempSrc:   Oral Oral  SpO2: 91% 90%  97%  Weight:    57.5 kg (126 lb 12.2 oz)  Height:    5\' 6"  (1.676 m)   Eyes: Anicteric no pallor. ENMT: No discharge from the ears eyes nose and mouth. Neck: No mass felt.  No neck rigidity. Respiratory: No rhonchi or crepitations. Cardiovascular: S1-S2 heard no murmurs appreciated. Abdomen: Soft nontender bowel sounds present. Musculoskeletal: No edema.  No joint effusion. Skin: No rash.  Skin appears warm. Neurologic: Alert awake oriented to time place and person.  Moves all extremities. Psychiatric: Appears normal.  Normal affect.   Labs on Admission: I have personally reviewed following labs and imaging studies  CBC: Recent Labs  Lab 04/22/17 1951  WBC 24.1*  NEUTROABS 19.4*  HGB 15.7  HCT 45.5  MCV 90.3  PLT 315   Basic Metabolic Panel: Recent Labs  Lab 04/22/17 1951  NA 136  K 3.7  CL 101  CO2 25  GLUCOSE 118*  BUN 13  CREATININE 0.89  CALCIUM 9.2   GFR: Estimated Creatinine Clearance: 71.8 mL/min (by C-G formula based on SCr of 0.89 mg/dL). Liver Function Tests: Recent Labs  Lab 04/22/17 1951  AST 21  ALT 19  ALKPHOS 94  BILITOT 1.0  PROT 7.7  ALBUMIN 3.9   No results for input(s): LIPASE, AMYLASE in the last 168 hours. No results for input(s): AMMONIA in the last 168 hours. Coagulation Profile: Recent Labs  Lab 04/22/17 1951  INR 0.90   Cardiac Enzymes: No results for input(s): CKTOTAL, CKMB, CKMBINDEX, TROPONINI in the last 168 hours. BNP (last 3 results) No results for input(s): PROBNP in the last 8760 hours. HbA1C: No results for input(s): HGBA1C in the last 72 hours. CBG: No results for input(s): GLUCAP in the last 168 hours. Lipid Profile: No results for input(s): CHOL, HDL, LDLCALC, TRIG, CHOLHDL, LDLDIRECT in the last 72 hours. Thyroid Function Tests: No results for input(s): TSH, T4TOTAL, FREET4, T3FREE, THYROIDAB in the last 72 hours. Anemia  Panel: No results for input(s): VITAMINB12, FOLATE, FERRITIN, TIBC, IRON, RETICCTPCT in the last 72 hours. Urine analysis:    Component Value Date/Time   COLORURINE YELLOW 04/22/2017 2119   APPEARANCEUR HAZY (A) 04/22/2017 2119   LABSPEC 1.015 04/22/2017 2119   PHURINE 8.0 04/22/2017 2119   GLUCOSEU NEGATIVE 04/22/2017 2119   HGBUR SMALL (A) 04/22/2017 2119   BILIRUBINUR NEGATIVE 04/22/2017 2119   [place] 04/22/2017 2119   PROTEINUR 30 (A) 04/22/2017 2119   UROBILINOGEN 0.2 06/12/2013 0415   NITRITE POSITIVE (A) 04/22/2017 2119   LEUKOCYTESUR LARGE (A) 04/22/2017 2119   Sepsis Labs: @LABRCNTIP (procalcitonin:4,lacticidven:4) )No results found for this or any previous visit (from the past 240 hour(s)).   Radiological Exams on Admission: Dg Chest 2 View  Result Date: 04/22/2017 CLINICAL DATA:  Fever EXAM: CHEST  2 VIEW COMPARISON:  Chest radiograph March 30, 2017 and chest CT November 25, 2016 FINDINGS: There is bullous disease in the upper lobes, more severe on the right than on the left. There is no edema or consolidation. Heart size and pulmonary vascularity are stable. Note that there is somewhat diminished vascularity in the upper lobes in the areas of bullous disease. Heart size is normal. No adenopathy. No bone lesions peer IMPRESSION: Upper lobe bullous disease, more severe on the right than on the left. No edema or consolidation. Stable cardiac silhouette. Electronically Signed   By: Lowella Grip III M.D.   On: 04/22/2017 20:38   Ct Renal Stone Study  Result Date: 04/22/2017 CLINICAL DATA:  Fever today.  Hematuria.  Recent bladder surgery. EXAM: CT ABDOMEN AND PELVIS WITHOUT CONTRAST TECHNIQUE: Multidetector CT imaging of the abdomen and pelvis was performed following the standard protocol without IV contrast. COMPARISON:  CT 03/31/2017 FINDINGS: Lower chest: Improved lung base aeration from prior exam with residual lower lobe bronchial thickening. Improved mucoid  impaction. Residual right lower lobe atelectasis. No pleural fluid. Hepatobiliary: Borderline hepatic steatosis. No focal hepatic lesion. Gallbladder physiologically distended, no calcified stone. No biliary dilatation. Pancreas: No ductal dilatation or inflammation. Spleen: Normal in size without focal abnormality. Adrenals/Urinary Tract: Normal adrenal glands. Mild to moderate right perinephric edema, not seen previously. Mild prominence of the right renal collecting system and  ureter without stone. Punctate calcification in the right mid kidney may be tiny nonobstructing stone or vascular. Right lateral bladder wall thickening and perivesicular edema with generalized improved stranding and fluid in the pelvis. Small focus of air in the urinary bladder. No left hydronephrosis or perinephric edema. Stomach/Bowel: No bowel wall thickening or inflammation. Stomach is nondistended. No small bowel obstruction. Moderate colonic stool burden. Normal appendix. Vascular/Lymphatic: Aortic atherosclerosis. Multiple small retroperitoneal nodes not enlarged by size criteria. No definite pelvic adenopathy. Reproductive: Prominent prostate gland with central calcifications. Other: Improved pelvic stranding and free fluid from prior exam with mild residual stranding about the right greater than left bladder. Extraluminal air from prior exam has resolved. No evidence of abscess. Minimal fat on the left inguinal canal. Musculoskeletal: There are no acute or suspicious osseous abnormalities. Chronic bilateral L5-S1 pars defects and grade 1 anterolisthesis, unchanged. IMPRESSION: 1. Right perinephric and periureteric edema, new from prior exam, favoring ascending urinary tract infection. 2. Post recent bladder rupture with improved inflammatory change in the pelvis and resolution of previous extraluminal air. Mild residual perivesicular stranding and right lateral bladder wall thickening, may be inflammatory or infectious.  Electronically Signed   By: Jeb Levering M.D.   On: 04/22/2017 22:08     Assessment/Plan Principal Problem:   Sepsis ([place]) Active Problems:   COPD GOLD III   CAD (coronary artery disease)   HTN (hypertension)   Acute lower UTI    1. Sepsis secondary to UTI -likely has pyelonephritis.  Since patient's recent urine cultures on November 27 grew E. coli with ESBL which I confirmed with pharmacist patient will be placed on meropenem until final cultures are available for this time.  Continue with hydration follow lactate levels pro calcitonin levels. 2. History  CAD -denies any chest pain.  Patient is on statins beta-blockers and aspirin. 3. COPD -not actively wheezing continue home inhalers. 4. History of recent transurethral resection of bladder tumor.  Was complicated with perforation.   DVT prophylaxis: Lovenox. Code Status: Full code. Family Communication: Discussed with patient. Disposition Plan: Home. Consults called: None. Admission status: Inpatient.   Rise Patience MD [facility] Pager [phone].  If 7PM-7AM, please contact night-coverage [email or web address] Password [email or web address]  04/23/2017, 12:38 AM

## 2017-04-24 DIAGNOSIS — I10 Essential (primary) hypertension: Secondary | ICD-10-CM

## 2017-04-24 DIAGNOSIS — J449 Chronic obstructive pulmonary disease, unspecified: Secondary | ICD-10-CM

## 2017-04-24 DIAGNOSIS — I2510 Atherosclerotic heart disease of native coronary artery without angina pectoris: Secondary | ICD-10-CM

## 2017-04-24 DIAGNOSIS — N10 Acute tubulo-interstitial nephritis: Secondary | ICD-10-CM

## 2017-04-24 LAB — COMPREHENSIVE METABOLIC PANEL
ALBUMIN: 2.7 g/dL — AB (ref 3.5–5.0)
ALK PHOS: 76 U/L (ref 38–126)
ALT: 22 U/L (ref 17–63)
ANION GAP: 8 (ref 5–15)
AST: 17 U/L (ref 15–41)
BUN: 13 mg/dL (ref 6–20)
CALCIUM: 8.4 mg/dL — AB (ref 8.9–10.3)
CHLORIDE: 103 mmol/L (ref 101–111)
CO2: 24 mmol/L (ref 22–32)
Creatinine, Ser: 0.75 mg/dL (ref 0.61–1.24)
GFR calc non Af Amer: >60 mL/min (ref >60)
GLUCOSE: 131 mg/dL — AB (ref 65–99)
Potassium: 3.1 mmol/L — ABNORMAL LOW (ref 3.5–5.1)
SODIUM: 135 mmol/L (ref 135–145)
Total Bilirubin: 0.9 mg/dL (ref 0.3–1.2)
Total Protein: 6.1 g/dL — ABNORMAL LOW (ref 6.5–8.1)

## 2017-04-24 LAB — CBC
HCT: 39.3 % (ref 39.0–52.0)
HEMOGLOBIN: 13 g/dL (ref 13.0–17.0)
MCH: 30.2 pg (ref 26.0–34.0)
MCHC: 33.1 g/dL (ref 30.0–36.0)
MCV: 91.2 fL (ref 78.0–100.0)
PLATELETS: 274 10*3/uL (ref 150–400)
RBC: 4.31 MIL/uL (ref 4.22–5.81)
RDW: 13.2 % (ref 11.5–15.5)
WBC: 25.1 10*3/uL — ABNORMAL HIGH (ref 4.0–10.5)

## 2017-04-24 LAB — URINE CULTURE: CULTURE: NO GROWTH

## 2017-04-24 LAB — C DIFFICILE QUICK SCREEN W PCR REFLEX
C DIFFICILE (CDIFF) INTERP: NOT DETECTED
C DIFFICILE (CDIFF) TOXIN: NEGATIVE
C Diff antigen: NEGATIVE

## 2017-04-24 MED ORDER — POTASSIUM CHLORIDE 10 MEQ/100ML IV SOLN
10.0000 meq | INTRAVENOUS | Status: AC
  Administered 2017-04-24 (×6): 10 meq via INTRAVENOUS
  Filled 2017-04-24 (×6): qty 100

## 2017-04-24 MED ORDER — POTASSIUM CHLORIDE CRYS ER 20 MEQ PO TBCR: 40.0000 meq | EXTENDED_RELEASE_TABLET | ORAL | Status: DC

## 2017-04-24 NOTE — Progress Notes (Signed)
Patient ID: Timothy [name], male   DOB: December 14, 1955, 61 y.o.   MRN: [id number]  PROGRESS NOTE    AMITAI DELAUGHTER  BZJ:[id number] DOB: 10-29-1955 DOA: 04/22/2017 PCP: Ladell Pier, MD   Brief Narrative:  61 year old male with history of CAD status post stenting, COPD, recent transurethral resection of bladder tumor followed by perforation for which patient had been on Foley's catheter and antibiotics, urine culture had grown E. coli ESBL for  which he was placed on Augmentin and had followed up with his urologist 3 days prior to presentation when his Foley's catheter was removed.  He presented to the ED with fever, chills and fatigue.  CT renal study showed right perinephric and periureteric stranding.  He was started on broad-spectrum antibiotics.   Assessment & Plan:   Principal Problem:   Sepsis ([place]) Active Problems:   COPD GOLD III   CAD (coronary artery disease)   HTN (hypertension)   Acute lower UTI   Sepsis secondary to pyelonephritis -Follow cultures.  Continue current antibiotics -Hemodynamically improving -Transfer to telemetry  Probable acute right-sided pyelonephritis -Continue meropenem; recent urine cultures on November 27 had grown E. coli ESBL.  Follow blood and urine cultures  Leukocytosis -Improving.  Follow cultures.  Continue antibiotics  COPD -Currently stable.  Continue home inhalers  History of recent transurethral resection of bladder tumor which was complicated with perforation status post Foley's catheter placement and urine culture growing ESBL E. coli which was treated with antibiotics and subsequent Foley catheter removal -Outpatient follow-up with urology with  History of coronary artery disease -Stable.  Continue statins, beta-blockers and aspirin   DVT prophylaxis: Lovenox Code Status: Full Family Communication: None at bedside Disposition Plan: Home in 2-3 days  Consultants: None  Procedures: None  Antimicrobials: Meropenem from  04/22/2017 onwards   Subjective: Patient seen and examined at bedside.  He complains of mild nausea and had vomiting overnight.  No chest pain, shortness of breath.  Objective: Vitals:   04/24/17 0600 04/24/17 0800 04/24/17 1000 04/24/17 1042  BP: (!) 106/54 (!) 111/58 110/60   Pulse: 87 86 (!) 150   Resp: (!) 7 (!) 21 (!) 21   Temp:  98.4 F (36.9 C)    TempSrc:  Oral    SpO2: 96% 97% 95% 98%  Weight:      Height:        Intake/Output Summary (Last 24 hours) at 04/24/2017 1159 Last data filed at 04/24/2017 0509 Gross per 24 hour  Intake 2075 ml  Output 850 ml  Net 1225 ml   Filed Weights   04/23/17 0019 04/23/17 0148 04/24/17 0500  Weight: 57.5 kg (126 lb 12.2 oz) 57.5 kg (126 lb 12.2 oz) 57.7 kg (127 lb 3.3 oz)    Examination:  General exam: Appears calm and comfortable  Respiratory system: Bilateral decreased breath sound at bases Cardiovascular system: S1 & S2 heard, intermittent tachycardia. Gastrointestinal system: Abdomen is nondistended, soft and nontender. Normal bowel sounds heard. Extremities: No cyanosis, clubbing, edema   Data Reviewed: I have personally reviewed following labs and imaging studies  CBC: Recent Labs  Lab 04/22/17 1951 04/23/17 0104 04/24/17 0319  WBC 24.1* 28.3* 25.1*  NEUTROABS 19.4* 23.0*  --   HGB 15.7 13.5 13.0  HCT 45.5 41.1 39.3  MCV 90.3 91.3 91.2  PLT 370 321 017   Basic Metabolic Panel: Recent Labs  Lab 04/22/17 1951 04/23/17 0104 04/24/17 0319  NA 136 138 135  K 3.7 3.9 3.1*  CL  101 105 103  CO2 25 26 24   GLUCOSE 118* 117* 131*  BUN 13 13 13   CREATININE 0.89 0.98  0.91 0.75  CALCIUM 9.2 8.5* 8.4*   GFR: Estimated Creatinine Clearance: 80.1 mL/min (by C-G formula based on SCr of 0.75 mg/dL). Liver Function Tests: Recent Labs  Lab 04/22/17 1951 04/23/17 0104 04/24/17 0319  AST 21 26 17   ALT 19 26 22   ALKPHOS 94 83 76  BILITOT 1.0 0.8 0.9  PROT 7.7 6.7 6.1*  ALBUMIN 3.9 3.3* 2.7*   No results for  input(s): LIPASE, AMYLASE in the last 168 hours. No results for input(s): AMMONIA in the last 168 hours. Coagulation Profile: Recent Labs  Lab 04/22/17 1951 04/23/17 0104  INR 0.90 0.99   Cardiac Enzymes: No results for input(s): CKTOTAL, CKMB, CKMBINDEX, TROPONINI in the last 168 hours. BNP (last 3 results) No results for input(s): PROBNP in the last 8760 hours. HbA1C: No results for input(s): HGBA1C in the last 72 hours. CBG: No results for input(s): GLUCAP in the last 168 hours. Lipid Profile: No results for input(s): CHOL, HDL, LDLCALC, TRIG, CHOLHDL, LDLDIRECT in the last 72 hours. Thyroid Function Tests: No results for input(s): TSH, T4TOTAL, FREET4, T3FREE, THYROIDAB in the last 72 hours. Anemia Panel: No results for input(s): VITAMINB12, FOLATE, FERRITIN, TIBC, IRON, RETICCTPCT in the last 72 hours. Sepsis Labs: Recent Labs  Lab 04/22/17 1957 04/23/17 0104 04/23/17 0310  PROCALCITON  --  0.89  --   LATICACIDVEN 1.54 1.1 0.8    Recent Results (from the past 240 hour(s))  Culture, blood (Routine x 2)     Status: None (Preliminary result)   Collection Time: 04/22/17  7:51 PM  Result Value Ref Range Status   Specimen Description BLOOD LEFT HAND  Final   Special Requests   Final    BOTTLES DRAWN AEROBIC AND ANAEROBIC Blood Culture adequate volume   Culture   Final    NO GROWTH < 24 HOURS Performed at [facility], [place] [address] [place], [place] [address]    Report Status PENDING  Incomplete  Culture, blood (Routine x 2)     Status: None (Preliminary result)   Collection Time: 04/22/17  7:51 PM  Result Value Ref Range Status   Specimen Description BLOOD LEFT FOREARM  Final   Special Requests   Final    BOTTLES DRAWN AEROBIC AND ANAEROBIC Blood Culture adequate volume   Culture   Final    NO GROWTH < 24 HOURS Performed at [facility], [place] [address] [place], [place] [address]    Report Status PENDING  Incomplete  MRSA PCR Screening     Status:  None   Collection Time: 04/23/17 12:16 AM  Result Value Ref Range Status   MRSA by PCR NEGATIVE NEGATIVE Final    Comment:        The GeneXpert MRSA Assay (FDA approved for NASAL specimens only), is one component of a comprehensive MRSA colonization surveillance program. It is not intended to diagnose MRSA infection nor to guide or monitor treatment for MRSA infections.          Radiology Studies: Dg Chest 2 View  Result Date: 04/22/2017 CLINICAL DATA:  Fever EXAM: CHEST  2 VIEW COMPARISON:  Chest radiograph March 30, 2017 and chest CT November 25, 2016 FINDINGS: There is bullous disease in the upper lobes, more severe on the right than on the left. There is no edema or consolidation. Heart size and pulmonary vascularity  are stable. Note that there is somewhat diminished vascularity in the upper lobes in the areas of bullous disease. Heart size is normal. No adenopathy. No bone lesions peer IMPRESSION: Upper lobe bullous disease, more severe on the right than on the left. No edema or consolidation. Stable cardiac silhouette. Electronically Signed   By: Lowella Grip III M.D.   On: 04/22/2017 20:38   Ct Renal Stone Study  Result Date: 04/22/2017 CLINICAL DATA:  Fever today.  Hematuria.  Recent bladder surgery. EXAM: CT ABDOMEN AND PELVIS WITHOUT CONTRAST TECHNIQUE: Multidetector CT imaging of the abdomen and pelvis was performed following the standard protocol without IV contrast. COMPARISON:  CT 03/31/2017 FINDINGS: Lower chest: Improved lung base aeration from prior exam with residual lower lobe bronchial thickening. Improved mucoid impaction. Residual right lower lobe atelectasis. No pleural fluid. Hepatobiliary: Borderline hepatic steatosis. No focal hepatic lesion. Gallbladder physiologically distended, no calcified stone. No biliary dilatation. Pancreas: No ductal dilatation or inflammation. Spleen: Normal in size without focal abnormality. Adrenals/Urinary Tract: Normal adrenal  glands. Mild to moderate right perinephric edema, not seen previously. Mild prominence of the right renal collecting system and ureter without stone. Punctate calcification in the right mid kidney may be tiny nonobstructing stone or vascular. Right lateral bladder wall thickening and perivesicular edema with generalized improved stranding and fluid in the pelvis. Small focus of air in the urinary bladder. No left hydronephrosis or perinephric edema. Stomach/Bowel: No bowel wall thickening or inflammation. Stomach is nondistended. No small bowel obstruction. Moderate colonic stool burden. Normal appendix. Vascular/Lymphatic: Aortic atherosclerosis. Multiple small retroperitoneal nodes not enlarged by size criteria. No definite pelvic adenopathy. Reproductive: Prominent prostate gland with central calcifications. Other: Improved pelvic stranding and free fluid from prior exam with mild residual stranding about the right greater than left bladder. Extraluminal air from prior exam has resolved. No evidence of abscess. Minimal fat on the left inguinal canal. Musculoskeletal: There are no acute or suspicious osseous abnormalities. Chronic bilateral L5-S1 pars defects and grade 1 anterolisthesis, unchanged. IMPRESSION: 1. Right perinephric and periureteric edema, new from prior exam, favoring ascending urinary tract infection. 2. Post recent bladder rupture with improved inflammatory change in the pelvis and resolution of previous extraluminal air. Mild residual perivesicular stranding and right lateral bladder wall thickening, may be inflammatory or infectious. Electronically Signed   By: Jeb Levering M.D.   On: 04/22/2017 22:08        Scheduled Meds: . aspirin EC  81 mg Oral Daily  . atorvastatin  40 mg Oral QPM  . bisoprolol  2.5 mg Oral QHS  . enoxaparin (LOVENOX) injection  40 mg Subcutaneous QHS  . mometasone-formoterol  2 puff Inhalation BID  . pantoprazole  20 mg Oral Daily   Continuous  Infusions: . meropenem (MERREM) IV Stopped (04/24/17 0558)  . potassium chloride Stopped (04/24/17 1113)  . sodium chloride       LOS: 2 days        Aline August, MD [facility] Pager [phone]  If 7PM-7AM, please contact night-coverage [email or web address] Password [email or web address] 04/24/2017, 11:59 AM

## 2017-04-24 NOTE — Progress Notes (Signed)
Assumed care of pt from ICU.  I agree with previous RN's assessment.  Will continue with plan of care.  

## 2017-04-25 DIAGNOSIS — D72829 Elevated white blood cell count, unspecified: Secondary | ICD-10-CM

## 2017-04-25 LAB — BASIC METABOLIC PANEL
Anion gap: 6 (ref 5–15)
BUN: 11 mg/dL (ref 6–20)
CO2: 26 mmol/L (ref 22–32)
CREATININE: 0.65 mg/dL (ref 0.61–1.24)
Calcium: 8.4 mg/dL — ABNORMAL LOW (ref 8.9–10.3)
Chloride: 102 mmol/L (ref 101–111)
GFR calc Af Amer: >60 mL/min (ref >60)
GLUCOSE: 96 mg/dL (ref 65–99)
POTASSIUM: 3.5 mmol/L (ref 3.5–5.1)
SODIUM: 134 mmol/L — AB (ref 135–145)

## 2017-04-25 LAB — CBC WITH DIFFERENTIAL/PLATELET
Basophils Absolute: 0 10*3/uL (ref 0.0–0.1)
Basophils Relative: 0 %
EOS ABS: 0.1 10*3/uL (ref 0.0–0.7)
EOS PCT: 1 %
HCT: 39.1 % (ref 39.0–52.0)
Hemoglobin: 13 g/dL (ref 13.0–17.0)
LYMPHS ABS: 1.6 10*3/uL (ref 0.7–4.0)
LYMPHS PCT: 10 %
MCH: 30.1 pg (ref 26.0–34.0)
MCHC: 33.2 g/dL (ref 30.0–36.0)
MCV: 90.5 fL (ref 78.0–100.0)
MONO ABS: 3.3 10*3/uL — AB (ref 0.1–1.0)
MONOS PCT: 20 %
Neutro Abs: 11.4 10*3/uL — ABNORMAL HIGH (ref 1.7–7.7)
Neutrophils Relative %: 69 %
PLATELETS: 252 10*3/uL (ref 150–400)
RBC: 4.32 MIL/uL (ref 4.22–5.81)
RDW: 13.2 % (ref 11.5–15.5)
WBC: 16.5 10*3/uL — ABNORMAL HIGH (ref 4.0–10.5)

## 2017-04-25 LAB — MAGNESIUM: MAGNESIUM: 2 mg/dL (ref 1.7–2.4)

## 2017-04-25 NOTE — Evaluation (Signed)
Physical Therapy Evaluation Patient Details Name: Timothy [name] MRN: [id number] DOB: 1956-01-27 Today's Date: 04/25/2017   History of Present Illness  61 year old male with history of CAD status post stenting, COPD, recent transurethral resection of bladder tumor followed by perforation; adm with sepsis  Clinical Impression  Patient evaluated by Physical Therapy with no further acute PT needs identified. All education has been completed and the patient has no further questions. *See below for any follow-up Physical Therapy or equipment needs. PT is signing off. Thank you for this referral. Pt is doing well; steady gait, no LOB; encouraged pt to continue to amb as tol to keep up strength and endurance during hospital stay; no further needs    Follow Up Recommendations No PT follow up    Equipment Recommendations  None recommended by PT    Recommendations for Other Services       Precautions / Restrictions Restrictions Weight Bearing Restrictions: No      Mobility  Bed Mobility Overal bed mobility: Independent                Transfers Overall transfer level: Independent                  Ambulation/Gait Ambulation/Gait assistance: Independent Ambulation Distance (Feet): 380 Feet Assistive device: None Gait Pattern/deviations: Step-through pattern;WFL(Within Functional Limits)     General Gait Details: 2-3/4 DOE after amb, reports DOE at baseline; HR 117 max  Stairs            Wheelchair Mobility    Modified Rankin (Stroke Patients Only)       Balance Overall balance assessment: No apparent balance deficits (not formally assessed)                                           Pertinent Vitals/Pain Pain Assessment: No/denies pain    Home Living Family/patient expects to be discharged to:: Private residence Living Arrangements: Spouse/significant other(girlfriend) Available Help at Discharge: Family;Available 24  hours/day Type of Home: House Home Access: Level entry     Home Layout: One level Home Equipment: None      Prior Function Level of Independence: Independent         Comments: on disability, worked as Dealer prior; 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        Extremity/Trunk Assessment   Upper Extremity Assessment Upper Extremity Assessment: Overall WFL for tasks assessed    Lower Extremity Assessment Lower Extremity Assessment: Overall WFL for tasks assessed       Communication   Communication: No difficulties  Cognition Arousal/Alertness: Awake/alert Behavior During Therapy: WFL for tasks assessed/performed Overall Cognitive Status: Within Functional Limits for tasks assessed                                        General Comments      Exercises     Assessment/Plan    PT Assessment Patent does not need any further PT services  PT Problem List         PT Treatment Interventions      PT Goals (Current goals can be found in the Care Plan section)  Acute Rehab PT Goals Patient Stated Goal: home soon PT Goal Formulation: All assessment and education  complete, DC therapy    Frequency     Barriers to discharge        Co-evaluation               AM-PAC PT "6 Clicks" Daily Activity  Outcome Measure Difficulty turning over in bed (including adjusting bedclothes, sheets and blankets)?: None Difficulty moving from lying on back to sitting on the side of the bed? : None Difficulty sitting down on and standing up from a chair with arms (e.g., wheelchair, bedside commode, etc,.)?: None Help needed moving to and from a bed to chair (including a wheelchair)?: None Help needed walking in hospital room?: None Help needed climbing 3-5 steps with a railing? : None 6 Click Score: 24    End of Session   Activity Tolerance: Patient tolerated treatment well Patient left: with call bell/phone within reach   PT Visit  Diagnosis: Difficulty in walking, not elsewhere classified (R26.2)    Time: 8032-1224 PT Time Calculation (min) (ACUTE ONLY): 12 min   Charges:   PT Evaluation $PT Eval Low Complexity: 1 Low     PT G CodesKenyon Ana, PT Pager: [phone] 04/25/2017   Kenyon Ana 04/25/2017, 2:28 PM

## 2017-04-25 NOTE — Progress Notes (Signed)
Patient ID: Timothy [name], male   DOB: 1956/01/08, 61 y.o.   MRN: [id number]  PROGRESS NOTE    Timothy [name]  AYT:[id number] DOB: 1955-06-26 DOA: 04/22/2017 PCP: Ladell Pier, MD   Brief Narrative:  61 year old male with history of CAD status post stenting, COPD, recent transurethral resection of bladder tumor followed by perforation for which patient had been on Foley's catheter and antibiotics, urine culture had grown E. coli ESBL for  which he was placed on Augmentin and had followed up with his urologist 3 days prior to presentation when his Foley's catheter was removed.  He presented to the ED with fever, chills and fatigue.  CT renal study showed right perinephric and periureteric stranding.  He was started on broad-spectrum antibiotics.   Assessment & Plan:   Principal Problem:   Sepsis ([place]) Active Problems:   COPD GOLD III   CAD (coronary artery disease)   HTN (hypertension)   Acute lower UTI   Sepsis secondary to pyelonephritis -Resolved. Hemodynamically stable   Probable acute right-sided pyelonephritis -Continue meropenem. Blood and urine cultures on admission have been negative so far. recent urine cultures on November 27 had grown E. coli ESBL.    Leukocytosis -Improving.  Follow cultures.  Continue antibiotics. Repeat a.m. labs  COPD -Currently stable.  Continue home inhalers  History of recent transurethral resection of bladder tumor which was complicated with perforation status post Foley's catheter placement and urine culture growing ESBL E. coli which was treated with antibiotics and subsequent Foley catheter removal -Outpatient follow-up with urology with  History of coronary artery disease -Stable.  Continue statins, beta-blockers and aspirin   DVT prophylaxis: Lovenox Code Status: Full Family Communication: None at bedside Disposition Plan: Home in 2-3 days  Consultants: None  Procedures: None  Antimicrobials: Meropenem from 04/22/2017  onwards   Subjective: Patient seen and examined at bedside.  He complains of mild nausea and had vomiting overnight.  No chest pain, shortness of breath.  Objective: Vitals:   04/24/17 1521 04/24/17 2011 04/24/17 2252 04/25/17 0621  BP: (!) 121/58  122/72 111/73  Pulse: 81  83 81  Resp: 20  14 13   Temp: 98.3 F (36.8 C)  98.9 F (37.2 C) 98.6 F (37 C)  TempSrc: Oral  Oral Oral  SpO2: 95% 95% 97% 96%  Weight:    56.6 kg (124 lb 12.5 oz)  Height:        Intake/Output Summary (Last 24 hours) at 04/25/2017 1054 Last data filed at 04/25/2017 3557 Gross per 24 hour  Intake 545 ml  Output 400 ml  Net 145 ml   Filed Weights   04/23/17 0148 04/24/17 0500 04/25/17 0621  Weight: 57.5 kg (126 lb 12.2 oz) 57.7 kg (127 lb 3.3 oz) 56.6 kg (124 lb 12.5 oz)    Examination:  General exam: Appears calm and comfortable  Respiratory system: Bilateral decreased breath sound at bases Cardiovascular system: S1 & S2 heard, rate controlled Gastrointestinal system: Abdomen is nondistended, soft and nontender. Normal bowel sounds heard. Extremities: No cyanosis, clubbing, edema   Data Reviewed: I have personally reviewed following labs and imaging studies  CBC: Recent Labs  Lab 04/22/17 1951 04/23/17 0104 04/24/17 0319 04/25/17 0530  WBC 24.1* 28.3* 25.1* 16.5*  NEUTROABS 19.4* 23.0*  --  11.4*  HGB 15.7 13.5 13.0 13.0  HCT 45.5 41.1 39.3 39.1  MCV 90.3 91.3 91.2 90.5  PLT 370 321 274 322   Basic Metabolic Panel: Recent Labs  Lab 04/22/17  1951 04/23/17 0104 04/24/17 0319 04/25/17 0530  NA 136 138 135 134*  K 3.7 3.9 3.1* 3.5  CL 101 105 103 102  CO2 25 26 24 26   GLUCOSE 118* 117* 131* 96  BUN 13 13 13 11   CREATININE 0.89 0.98  0.91 0.75 0.65  CALCIUM 9.2 8.5* 8.4* 8.4*  MG  --   --   --  2.0   GFR: Estimated Creatinine Clearance: 78.6 mL/min (by C-G formula based on SCr of 0.65 mg/dL). Liver Function Tests: Recent Labs  Lab 04/22/17 1951 04/23/17 0104  04/24/17 0319  AST 21 26 17   ALT 19 26 22   ALKPHOS 94 83 76  BILITOT 1.0 0.8 0.9  PROT 7.7 6.7 6.1*  ALBUMIN 3.9 3.3* 2.7*   No results for input(s): LIPASE, AMYLASE in the last 168 hours. No results for input(s): AMMONIA in the last 168 hours. Coagulation Profile: Recent Labs  Lab 04/22/17 1951 04/23/17 0104  INR 0.90 0.99   Cardiac Enzymes: No results for input(s): CKTOTAL, CKMB, CKMBINDEX, TROPONINI in the last 168 hours. BNP (last 3 results) No results for input(s): PROBNP in the last 8760 hours. HbA1C: No results for input(s): HGBA1C in the last 72 hours. CBG: No results for input(s): GLUCAP in the last 168 hours. Lipid Profile: No results for input(s): CHOL, HDL, LDLCALC, TRIG, CHOLHDL, LDLDIRECT in the last 72 hours. Thyroid Function Tests: No results for input(s): TSH, T4TOTAL, FREET4, T3FREE, THYROIDAB in the last 72 hours. Anemia Panel: No results for input(s): VITAMINB12, FOLATE, FERRITIN, TIBC, IRON, RETICCTPCT in the last 72 hours. Sepsis Labs: Recent Labs  Lab 04/22/17 1957 04/23/17 0104 04/23/17 0310  PROCALCITON  --  0.89  --   LATICACIDVEN 1.54 1.1 0.8    Recent Results (from the past 240 hour(s))  Culture, blood (Routine x 2)     Status: None (Preliminary result)   Collection Time: 04/22/17  7:51 PM  Result Value Ref Range Status   Specimen Description BLOOD LEFT HAND  Final   Special Requests   Final    BOTTLES DRAWN AEROBIC AND ANAEROBIC Blood Culture adequate volume   Culture   Final    NO GROWTH 2 DAYS Performed at [facility], [place] [address] [place], [place] [address]    Report Status PENDING  Incomplete  Culture, blood (Routine x 2)     Status: None (Preliminary result)   Collection Time: 04/22/17  7:51 PM  Result Value Ref Range Status   Specimen Description BLOOD LEFT FOREARM  Final   Special Requests   Final    BOTTLES DRAWN AEROBIC AND ANAEROBIC Blood Culture adequate volume   Culture   Final    NO GROWTH 2  DAYS Performed at [facility], [place] [address], [place], [place] [address]    Report Status PENDING  Incomplete  MRSA PCR Screening     Status: None   Collection Time: 04/23/17 12:16 AM  Result Value Ref Range Status   MRSA by PCR NEGATIVE NEGATIVE Final    Comment:        The GeneXpert MRSA Assay (FDA approved for NASAL specimens only), is one component of a comprehensive MRSA colonization surveillance program. It is not intended to diagnose MRSA infection nor to guide or monitor treatment for MRSA infections.   Culture, blood (x 2)     Status: None (Preliminary result)   Collection Time: 04/23/17 12:48 AM  Result Value Ref Range Status   Specimen Description BLOOD LEFT  ANTECUBITAL  Final   Special Requests   Final    BOTTLES DRAWN AEROBIC AND ANAEROBIC Blood Culture adequate volume   Culture   Final    NO GROWTH 1 DAY Performed at [facility], [address] [place], [place] [address]    Report Status PENDING  Incomplete  Culture, blood (x 2)     Status: None (Preliminary result)   Collection Time: 04/23/17  1:04 AM  Result Value Ref Range Status   Specimen Description BLOOD RIGHT ANTECUBITAL  Final   Special Requests   Final    BOTTLES DRAWN AEROBIC AND ANAEROBIC Blood Culture adequate volume   Culture   Final    NO GROWTH 1 DAY Performed at [facility], [place] [address] [place], [place] [address]    Report Status PENDING  Incomplete  Culture, Urine     Status: None   Collection Time: 04/23/17 12:31 PM  Result Value Ref Range Status   Specimen Description URINE, RANDOM  Final   Special Requests NONE  Final   Culture   Final    NO GROWTH Performed at [facility], [place] [address] [place], [place] [address]    Report Status 04/24/2017 FINAL  Final  C difficile quick scan w PCR reflex     Status: None   Collection Time: 04/24/17  8:17 PM  Result Value Ref Range Status   C Diff antigen NEGATIVE NEGATIVE Final   C Diff toxin NEGATIVE  NEGATIVE Final   C Diff interpretation No C. difficile detected.  Final         Radiology Studies: No results found.      Scheduled Meds: . aspirin EC  81 mg Oral Daily  . atorvastatin  40 mg Oral QPM  . bisoprolol  2.5 mg Oral QHS  . enoxaparin (LOVENOX) injection  40 mg Subcutaneous QHS  . mometasone-formoterol  2 puff Inhalation BID  . pantoprazole  20 mg Oral Daily   Continuous Infusions: . meropenem (MERREM) IV Stopped (04/25/17 0848)  . sodium chloride       LOS: 3 days        Aline August, MD [facility] Pager [phone]  If 7PM-7AM, please contact night-coverage [email or web address] Password [facility] 04/25/2017, 10:54 AM

## 2017-04-26 DIAGNOSIS — I25118 Atherosclerotic heart disease of native coronary artery with other forms of angina pectoris: Secondary | ICD-10-CM

## 2017-04-26 LAB — CBC WITH DIFFERENTIAL/PLATELET
BASOS ABS: 0 10*3/uL (ref 0.0–0.1)
BASOS PCT: 0 %
EOS ABS: 0.1 10*3/uL (ref 0.0–0.7)
Eosinophils Relative: 1 %
HEMATOCRIT: 41.1 % (ref 39.0–52.0)
HEMOGLOBIN: 14 g/dL (ref 13.0–17.0)
Lymphocytes Relative: 14 %
Lymphs Abs: 1.9 10*3/uL (ref 0.7–4.0)
MCH: 30.4 pg (ref 26.0–34.0)
MCHC: 34.1 g/dL (ref 30.0–36.0)
MCV: 89.2 fL (ref 78.0–100.0)
Monocytes Absolute: 3.2 10*3/uL — ABNORMAL HIGH (ref 0.1–1.0)
Monocytes Relative: 22 %
NEUTROS ABS: 9 10*3/uL — AB (ref 1.7–7.7)
NEUTROS PCT: 63 %
Platelets: 289 10*3/uL (ref 150–400)
RBC: 4.61 MIL/uL (ref 4.22–5.81)
RDW: 13.1 % (ref 11.5–15.5)
WBC: 14.2 10*3/uL — AB (ref 4.0–10.5)

## 2017-04-26 LAB — BASIC METABOLIC PANEL
ANION GAP: 8 (ref 5–15)
BUN: 11 mg/dL (ref 6–20)
CHLORIDE: 100 mmol/L — AB (ref 101–111)
CO2: 26 mmol/L (ref 22–32)
CREATININE: 0.71 mg/dL (ref 0.61–1.24)
Calcium: 8.6 mg/dL — ABNORMAL LOW (ref 8.9–10.3)
GFR calc non Af Amer: >60 mL/min (ref >60)
Glucose, Bld: 107 mg/dL — ABNORMAL HIGH (ref 65–99)
Potassium: 3.3 mmol/L — ABNORMAL LOW (ref 3.5–5.1)
SODIUM: 134 mmol/L — AB (ref 135–145)

## 2017-04-26 LAB — MAGNESIUM: MAGNESIUM: 2.2 mg/dL (ref 1.7–2.4)

## 2017-04-26 MED ORDER — AMOXICILLIN-POT CLAVULANATE 875-125 MG PO TABS: 1.0000 | ORAL_TABLET | Freq: Two times a day (BID) | ORAL | 0 refills | Status: AC

## 2017-04-26 MED ORDER — AMOXICILLIN-POT CLAVULANATE 875-125 MG PO TABS: 1.0000 | ORAL_TABLET | Freq: Two times a day (BID) | ORAL | 0 refills | Status: DC

## 2017-04-26 MED ORDER — ONDANSETRON HCL 4 MG PO TABS: 4.0000 mg | ORAL_TABLET | Freq: Four times a day (QID) | ORAL | 0 refills | Status: DC | PRN

## 2017-04-26 MED ORDER — POTASSIUM CHLORIDE CRYS ER 20 MEQ PO TBCR
30.0000 meq | EXTENDED_RELEASE_TABLET | ORAL | Status: AC
  Administered 2017-04-26 (×2): 30 meq via ORAL
  Filled 2017-04-26 (×2): qty 3

## 2017-04-26 MED ORDER — ATORVASTATIN CALCIUM 40 MG PO TABS: 40.0000 mg | ORAL_TABLET | Freq: Every evening | ORAL | Status: DC

## 2017-04-26 MED FILL — ONDANSETRON HCL 4 MG TABLET: 4 | 5 days supply | Qty: 20 | Fill #0

## 2017-04-26 MED FILL — AMOX TR-K CLV 875-125 MG TA: 875-125 | 7 days supply | Qty: 14 | Fill #0

## 2017-04-26 NOTE — Evaluation (Signed)
Occupational Therapy Evaluation Patient Details Name: Timothy [name] MRN: [id number] DOB: [birth year] Today's Date: 04/26/2017    History of Present Illness 61 year old male with history of CAD status post stenting, COPD, recent transurethral resection of bladder tumor followed by perforation; adm with sepsis   Clinical Impression   Pt at baseline with activity . No further OT needed Education provided regarding safety with ADL activity     Follow Up Recommendations  No OT follow up    Equipment Recommendations  None recommended by OT    Recommendations for Other Services       Precautions / Restrictions Precautions Precautions: None      Mobility Bed Mobility Overal bed mobility: Independent                Transfers Overall transfer level: Independent                    Balance Overall balance assessment: No apparent balance deficits (not formally assessed)                                         ADL either performed or assessed with clinical judgement   ADL Overall ADL's : At baseline                                                          Pertinent Vitals/Pain Pain Assessment: No/denies pain           Communication Communication Communication: No difficulties   Cognition Arousal/Alertness: Awake/alert Behavior During Therapy: WFL for tasks assessed/performed Overall Cognitive Status: Within Functional Limits for tasks assessed                                     General Comments       Exercises     Shoulder Instructions      Home Living Family/patient expects to be discharged to:: Private residence Living Arrangements: Spouse/significant other(girlfriend) Available Help at Discharge: Family;Available 24 hours/day Type of Home: House Home Access: Level entry     Home Layout: One level     Bathroom Shower/Tub: Teacher, early years/pre: Standard     Home  Equipment: None          Prior Functioning/Environment Level of Independence: Independent        Comments: on disability, worked as Dealer prior; likes to spend time with his dog        OT Problem List:        OT Treatment/Interventions:      OT Goals(Current goals can be found in the care plan section) Acute Rehab OT Goals Patient Stated Goal: home   OT Frequency:     Barriers to D/C:               AM-PAC PT "6 Clicks" Daily Activity     Outcome Measure Help from another person eating meals?: None Help from another person taking care of personal grooming?: None Help from another person toileting, which includes using toliet, bedpan, or urinal?: None Help from another person bathing (including washing, rinsing, drying)?: None Help from another person  to put on and taking off regular upper body clothing?: None Help from another person to put on and taking off regular lower body clothing?: None 6 Click Score: 24   End of Session    Activity Tolerance: Patient tolerated treatment well Patient left: in bed;with call bell/phone within reach                   Time: 6122-4497 OT Time Calculation (min): 10 min Charges:  OT General Charges $OT Visit: 1 Visit OT Evaluation $OT Eval Low Complexity: 1 Low G-Codes:     Kari Baars, OT [phone]  Payton Mccallum D 04/26/2017, 2:40 PM

## 2017-04-26 NOTE — Discharge Summary (Addendum)
Physician Discharge Summary  LOUIE FLENNER KGU:[id number] DOB: 05-Nov-1955 DOA: 04/22/2017  PCP: Ladell Pier, MD  Admit date: 04/22/2017 Discharge date: 04/26/2017  Admitted From: Home  Disposition:  Home  Recommendations for Outpatient Follow-up:  1. Follow up with PCP in 1 week with repeat CBC/BMP 2. Follow-up with urology/Dr. Karsten Ro in one week   Home Health: No  Equipment/Devices: None  Discharge Condition: Stable  CODE STATUS: Full  Diet recommendation: Heart Healthy    Brief/Interim Summary: 61 year old male with history of CAD status post stenting, COPD, recent transurethral resection of bladder tumor followed by perforation for which patient had been on Foley's catheter and antibiotics, urine culture had grown E. coli ESBL for  which he was placed on Augmentin and had followed up with his urologist 3 days prior to presentation when his Foley's catheter was removed.  He presented to the ED with fever, chills and fatigue.  CT renal study showed right perinephric and periureteric stranding.  He was started on broad-spectrum antibiotics. His condition gradually improved. Cultures have been negative so far. He is tolerating diet. Currently afebrile. he'll be discharged home on oral antibiotics with outpatient follow-up with urology.   Discharge Diagnoses:  Principal Problem:   Sepsis ([place]) Active Problems:   COPD GOLD III   CAD (coronary artery disease)   HTN (hypertension)   Acute lower UTI   Sepsis secondary to pyelonephritis -Resolved. Hemodynamically stable   Probable acute right-sided pyelonephritis -Currently on meropenem. Blood and urine cultures on admission have been negative so far.  switch to oral Augmentin for another 7 days and discharge home. recent urine cultures on November 27 had grown E. coli ESBL.   - Outpatient follow-up with urology in a week  Leukocytosis -Improving.   outpatient follow-up.   COPD -Currently stable.  Continue home  inhalers  History of recent transurethral resection of bladder tumor which was complicated with perforation status post Foley's catheter placement and urine culture growing ESBL E. coli which was treated with antibiotics and subsequent Foley catheter removal -Outpatient follow-up with urology   History of coronary artery disease -Stable.  Continue statins, beta-blockers and aspirin     Discharge Instructions  Discharge Instructions    Ambulatory referral to Urology   Complete by:  As directed    Follow up with Dr. Karsten Ro in 1week. Recent rehospitalization for Acute pyelonephritis      Follow-up Information    Ladell Pier, MD. Schedule an appointment as soon as possible for a visit in 1 week(s).   Specialty:  Internal Medicine Why:  with repeat CBC/BMP Contact information: [place] [address] [phone]        Kathie Rhodes, MD. Schedule an appointment as soon as possible for a visit in 1 week(s).   Specialty:  Urology Contact information: [address] [place] [address] [phone]          Allergies  Allergen Reactions  . Ace Inhibitors     Wheezing, prior notes  . Aspirin Swelling    To lips.  . Ibuprofen Swelling    To lips.  . Losartan Other (See Comments)    Leg cramps   Allergies as of 04/26/2017      Reactions   Ace Inhibitors    Wheezing, prior notes   Aspirin Swelling   To lips.   Ibuprofen Swelling   To lips.   Losartan Other (See Comments)   Leg cramps      Medication List  STOP taking these medications   Oxycodone HCl 10 MG Tabs     TAKE these medications   acetaminophen 500 MG tablet Commonly known as:  TYLENOL Take 1,000 mg every 4 (four) hours as needed by mouth for mild pain.   albuterol (2.5 MG/3ML) 0.083% nebulizer solution Commonly known as:  PROVENTIL Take 3 mLs (2.5 mg total) by nebulization every 6 (six) hours as needed for wheezing or shortness of breath.   albuterol 108 (90  Base) MCG/ACT inhaler Commonly known as:  VENTOLIN HFA Inhale 2 puffs into the lungs every 6 (six) hours as needed for wheezing or shortness of breath.   amoxicillin-clavulanate 875-125 MG tablet Commonly known as:  AUGMENTIN Take 1 tablet by mouth every 12 (twelve) hours for 7 days.   aspirin EC 81 MG tablet Take 1 tablet (81 mg total) by mouth daily.   atorvastatin 40 MG tablet Commonly known as:  LIPITOR Take 1 tablet (40 mg total) by mouth every evening.   bisoprolol 5 MG tablet Commonly known as:  ZEBETA Take 0.5 tablets (2.5 mg total) by mouth at bedtime.   budesonide-formoterol 160-4.5 MCG/ACT inhaler Commonly known as:  SYMBICORT Inhale 2 puffs 2 (two) times daily into the lungs.   HYDROcodone-acetaminophen 10-325 MG tablet Commonly known as:  NORCO Take 1-2 tablets by mouth every 4 (four) hours as needed for moderate pain. Maximum dose per 24 hours - 8 pills   nitroGLYCERIN 0.4 MG SL tablet Commonly known as:  NITROSTAT Place 1 tablet (0.4 mg total) under the tongue every 5 (five) minutes as needed for chest pain. NEED OV.   ondansetron 4 MG tablet Commonly known as:  ZOFRAN Take 1 tablet (4 mg total) by mouth every 6 (six) hours as needed for nausea.   pantoprazole 20 MG tablet Commonly known as:  PROTONIX Take 1 tablet (20 mg total) by mouth daily.   phenazopyridine 200 MG tablet Commonly known as:  PYRIDIUM Take 1 tablet (200 mg total) by mouth 3 (three) times daily as needed for pain.        Consultations:  None   Procedures/Studies: Dg Chest 2 View  Result Date: 04/22/2017 CLINICAL DATA:  Fever EXAM: CHEST  2 VIEW COMPARISON:  Chest radiograph March 30, 2017 and chest CT November 25, 2016 FINDINGS: There is bullous disease in the upper lobes, more severe on the right than on the left. There is no edema or consolidation. Heart size and pulmonary vascularity are stable. Note that there is somewhat diminished vascularity in the upper lobes in the  areas of bullous disease. Heart size is normal. No adenopathy. No bone lesions peer IMPRESSION: Upper lobe bullous disease, more severe on the right than on the left. No edema or consolidation. Stable cardiac silhouette. Electronically Signed   By: Lowella Grip III M.D.   On: 04/22/2017 20:38   Ct Abdomen Pelvis W Contrast  Result Date: 03/31/2017 CLINICAL DATA:  Fever, malaise and emesis. Status post surgery for bladder cancer March 29, 2017, indwelling catheter. EXAM: CT ABDOMEN AND PELVIS WITH CONTRAST TECHNIQUE: Multidetector CT imaging of the abdomen and pelvis was performed using the standard protocol following bolus administration of intravenous contrast. CONTRAST:  80 cc Isovue-300 COMPARISON:  CT abdomen and pelvis August 03, 2015 FINDINGS: LOWER CHEST: Bilateral lower lobe mucoid impaction with atelectasis. HEPATOBILIARY: Liver and gallbladder are normal. PANCREAS: Normal. SPLEEN: Normal. ADRENALS/URINARY TRACT: Decompressed urinary bladder containing Foley catheter and air. Irregular wall thickening with focal defect RIGHT and possibly LEFT posterolateral  walls (axial 67/94). Contrast extravasation from the bladder into RIGHT pelvis (axial 9/19, series 8). Kidneys are orthotopic, demonstrating symmetric enhancement. No nephrolithiasis, hydronephrosis or solid renal masses. RIGHT greater than LEFT proximal urothelial enhancement. STOMACH/BOWEL: The stomach, small and large bowel are normal in course and caliber without inflammatory changes, sensitivity decreased without oral contrast. Mild amount of retained large bowel stool. Normal appendix. VASCULAR/LYMPHATIC: Aortoiliac vessels are normal in course and caliber. Moderate calcific atherosclerosis. No lymphadenopathy by CT size criteria. REPRODUCTIVE: Mild prostatomegaly. OTHER: Moderate amount of free fluid in RIGHT pelvis, extending to presacral space with small amount of free air. MUSCULOSKELETAL: Nonacute. Grade 1 L5-S1 anterolisthesis on  the basis of chronic bilateral L5 pars interarticularis defects. Mild lower lumbar levoscoliosis. IMPRESSION: 1. Bladder perforation with moderate amount of fluid in the pelvis most compatible with blood products and urine. Small amount of RIGHT pelvic free air. 2. Decompressed bladder containing a Foley catheter. Mild urothelial enhancement may be infectious or inflammatory. 3. Bilateral lower lobe mucoid impaction and atelectasis. 4. Acute findings discussed with and reconfirmed by Dr.DONALD WICKLINE on 03/31/2017 at 3:27 am. Aortic Atherosclerosis (ICD10-I70.0). Electronically Signed   By: Elon Alas M.D.   On: 03/31/2017 03:31   Dg Chest Port 1 View  Result Date: 03/31/2017 CLINICAL DATA:  Cough with possible sepsis EXAM: PORTABLE CHEST 1 VIEW COMPARISON:  CT 11/25/2016, radiograph 01/21/2015 FINDINGS: Emphysematous changes. No acute consolidation or effusion. Normal heart size. No pneumothorax. Aortic atherosclerosis. IMPRESSION: No active disease.  Emphysematous disease. Electronically Signed   By: Donavan Foil M.D.   On: 03/31/2017 00:13   Dg C-arm 1-60 Min-no Report  Result Date: 03/29/2017 Fluoroscopy was utilized by the requesting physician.  No radiographic interpretation.   Ct Renal Stone Study  Result Date: 04/22/2017 CLINICAL DATA:  Fever today.  Hematuria.  Recent bladder surgery. EXAM: CT ABDOMEN AND PELVIS WITHOUT CONTRAST TECHNIQUE: Multidetector CT imaging of the abdomen and pelvis was performed following the standard protocol without IV contrast. COMPARISON:  CT 03/31/2017 FINDINGS: Lower chest: Improved lung base aeration from prior exam with residual lower lobe bronchial thickening. Improved mucoid impaction. Residual right lower lobe atelectasis. No pleural fluid. Hepatobiliary: Borderline hepatic steatosis. No focal hepatic lesion. Gallbladder physiologically distended, no calcified stone. No biliary dilatation. Pancreas: No ductal dilatation or inflammation. Spleen:  Normal in size without focal abnormality. Adrenals/Urinary Tract: Normal adrenal glands. Mild to moderate right perinephric edema, not seen previously. Mild prominence of the right renal collecting system and ureter without stone. Punctate calcification in the right mid kidney may be tiny nonobstructing stone or vascular. Right lateral bladder wall thickening and perivesicular edema with generalized improved stranding and fluid in the pelvis. Small focus of air in the urinary bladder. No left hydronephrosis or perinephric edema. Stomach/Bowel: No bowel wall thickening or inflammation. Stomach is nondistended. No small bowel obstruction. Moderate colonic stool burden. Normal appendix. Vascular/Lymphatic: Aortic atherosclerosis. Multiple small retroperitoneal nodes not enlarged by size criteria. No definite pelvic adenopathy. Reproductive: Prominent prostate gland with central calcifications. Other: Improved pelvic stranding and free fluid from prior exam with mild residual stranding about the right greater than left bladder. Extraluminal air from prior exam has resolved. No evidence of abscess. Minimal fat on the left inguinal canal. Musculoskeletal: There are no acute or suspicious osseous abnormalities. Chronic bilateral L5-S1 pars defects and grade 1 anterolisthesis, unchanged. IMPRESSION: 1. Right perinephric and periureteric edema, new from prior exam, favoring ascending urinary tract infection. 2. Post recent bladder rupture with improved inflammatory change in  the pelvis and resolution of previous extraluminal air. Mild residual perivesicular stranding and right lateral bladder wall thickening, may be inflammatory or infectious. Electronically Signed   By: Jeb Levering M.D.   On: 04/22/2017 22:08      Subjective: Patient seen and examined at bedside. He feels much better. He denies any overnight fever, nausea or vomiting. He was to go home  Discharge Exam: Vitals:   04/25/17 2148 04/26/17 0635   BP: 114/73 127/74  Pulse: 93 84  Resp: 18 18  Temp: 98.6 F (37 C) 98.1 F (36.7 C)  SpO2: 96% 96%   Vitals:   04/25/17 1515 04/25/17 1951 04/25/17 2148 04/26/17 0635  BP: 118/65  114/73 127/74  Pulse: 97  93 84  Resp: 19  18 18   Temp: 98.3 F (36.8 C)  98.6 F (37 C) 98.1 F (36.7 C)  TempSrc: Oral  Oral Oral  SpO2: 96% 95% 96% 96%  Weight:    54.9 kg (121 lb 0.5 oz)  Height:        General: Pt is alert, awake, not in acute distress Cardiovascular: Rate controlled, S1/S2 + Respiratory: Bilateral decreased breath sounds at bases  Abdominal: Soft, NT, ND, bowel sounds + Extremities: no edema, no cyanosis    The results of significant diagnostics from this hospitalization (including imaging, microbiology, ancillary and laboratory) are listed below for reference.     Microbiology: Recent Results (from the past 240 hour(s))  Culture, blood (Routine x 2)     Status: None (Preliminary result)   Collection Time: 04/22/17  7:51 PM  Result Value Ref Range Status   Specimen Description BLOOD LEFT HAND  Final   Special Requests   Final    BOTTLES DRAWN AEROBIC AND ANAEROBIC Blood Culture adequate volume   Culture   Final    NO GROWTH 3 DAYS Performed at [facility], [address], [place], [place] [address]    Report Status PENDING  Incomplete  Culture, blood (Routine x 2)     Status: None (Preliminary result)   Collection Time: 04/22/17  7:51 PM  Result Value Ref Range Status   Specimen Description BLOOD LEFT FOREARM  Final   Special Requests   Final    BOTTLES DRAWN AEROBIC AND ANAEROBIC Blood Culture adequate volume   Culture   Final    NO GROWTH 3 DAYS Performed at [facility], [place] [address] [place], [place] [address]    Report Status PENDING  Incomplete  MRSA PCR Screening     Status: None   Collection Time: 04/23/17 12:16 AM  Result Value Ref Range Status   MRSA by PCR NEGATIVE NEGATIVE Final    Comment:        The GeneXpert MRSA Assay  (FDA approved for NASAL specimens only), is one component of a comprehensive MRSA colonization surveillance program. It is not intended to diagnose MRSA infection nor to guide or monitor treatment for MRSA infections.   Culture, blood (x 2)     Status: None (Preliminary result)   Collection Time: 04/23/17 12:48 AM  Result Value Ref Range Status   Specimen Description BLOOD LEFT ANTECUBITAL  Final   Special Requests   Final    BOTTLES DRAWN AEROBIC AND ANAEROBIC Blood Culture adequate volume   Culture   Final    NO GROWTH 2 DAYS Performed at [facility], [address], [place], [place] [address]    Report Status PENDING  Incomplete  Culture,  blood (x 2)     Status: None (Preliminary result)   Collection Time: 04/23/17  1:04 AM  Result Value Ref Range Status   Specimen Description BLOOD RIGHT ANTECUBITAL  Final   Special Requests   Final    BOTTLES DRAWN AEROBIC AND ANAEROBIC Blood Culture adequate volume   Culture   Final    NO GROWTH 2 DAYS Performed at [facility], [address] [place], [place] [address]    Report Status PENDING  Incomplete  Culture, Urine     Status: None   Collection Time: 04/23/17 12:31 PM  Result Value Ref Range Status   Specimen Description URINE, RANDOM  Final   Special Requests NONE  Final   Culture   Final    NO GROWTH Performed at [facility], [place] [address] [place], [place] [address]    Report Status 04/24/2017 FINAL  Final  C difficile quick scan w PCR reflex     Status: None   Collection Time: 04/24/17  8:17 PM  Result Value Ref Range Status   C Diff antigen NEGATIVE NEGATIVE Final   C Diff toxin NEGATIVE NEGATIVE Final   C Diff interpretation No C. difficile detected.  Final     Labs: BNP (last 3 results) No results for input(s): BNP in the last 8760 hours. Basic Metabolic Panel: Recent Labs  Lab 04/22/17 1951 04/23/17 0104 04/24/17 0319 04/25/17 0530 04/26/17 0432  NA 136 138 135 134* 134*  K 3.7  3.9 3.1* 3.5 3.3*  CL 101 105 103 102 100*  CO2 25 26 24 26 26   GLUCOSE 118* 117* 131* 96 107*  BUN 13 13 13 11 11   CREATININE 0.89 0.98  0.91 0.75 0.65 0.71  CALCIUM 9.2 8.5* 8.4* 8.4* 8.6*  MG  --   --   --  2.0 2.2   Liver Function Tests: Recent Labs  Lab 04/22/17 1951 04/23/17 0104 04/24/17 0319  AST 21 26 17   ALT 19 26 22   ALKPHOS 94 83 76  BILITOT 1.0 0.8 0.9  PROT 7.7 6.7 6.1*  ALBUMIN 3.9 3.3* 2.7*   No results for input(s): LIPASE, AMYLASE in the last 168 hours. No results for input(s): AMMONIA in the last 168 hours. CBC: Recent Labs  Lab 04/22/17 1951 04/23/17 0104 04/24/17 0319 04/25/17 0530 04/26/17 0432  WBC 24.1* 28.3* 25.1* 16.5* 14.2*  NEUTROABS 19.4* 23.0*  --  11.4* 9.0*  HGB 15.7 13.5 13.0 13.0 14.0  HCT 45.5 41.1 39.3 39.1 41.1  MCV 90.3 91.3 91.2 90.5 89.2  PLT 370 321 274 252 289   Cardiac Enzymes: No results for input(s): CKTOTAL, CKMB, CKMBINDEX, TROPONINI in the last 168 hours. BNP: Invalid input(s): POCBNP CBG: No results for input(s): GLUCAP in the last 168 hours. D-Dimer No results for input(s): DDIMER in the last 72 hours. Hgb A1c No results for input(s): HGBA1C in the last 72 hours. Lipid Profile No results for input(s): CHOL, HDL, LDLCALC, TRIG, CHOLHDL, LDLDIRECT in the last 72 hours. Thyroid function studies No results for input(s): TSH, T4TOTAL, T3FREE, THYROIDAB in the last 72 hours.  Invalid input(s): FREET3 Anemia work up No results for input(s): VITAMINB12, FOLATE, FERRITIN, TIBC, IRON, RETICCTPCT in the last 72 hours. Urinalysis    Component Value Date/Time   COLORURINE YELLOW 04/22/2017 2119   APPEARANCEUR HAZY (A) 04/22/2017 2119   LABSPEC 1.015 04/22/2017 2119   PHURINE 8.0 04/22/2017 2119   GLUCOSEU NEGATIVE 04/22/2017 2119   HGBUR SMALL (A)  04/22/2017 2119   [place] NEGATIVE 04/22/2017 2119   [place] 04/22/2017 2119   PROTEINUR 30 (A) 04/22/2017 2119   UROBILINOGEN 0.2 06/12/2013 0415    NITRITE POSITIVE (A) 04/22/2017 2119   LEUKOCYTESUR LARGE (A) 04/22/2017 2119   Sepsis Labs Invalid input(s): PROCALCITONIN,  WBC,  LACTICIDVEN Microbiology Recent Results (from the past 240 hour(s))  Culture, blood (Routine x 2)     Status: None (Preliminary result)   Collection Time: 04/22/17  7:51 PM  Result Value Ref Range Status   Specimen Description BLOOD LEFT HAND  Final   Special Requests   Final    BOTTLES DRAWN AEROBIC AND ANAEROBIC Blood Culture adequate volume   Culture   Final    NO GROWTH 3 DAYS Performed at [facility], [place] [address], [place], [place] [address]    Report Status PENDING  Incomplete  Culture, blood (Routine x 2)     Status: None (Preliminary result)   Collection Time: 04/22/17  7:51 PM  Result Value Ref Range Status   Specimen Description BLOOD LEFT FOREARM  Final   Special Requests   Final    BOTTLES DRAWN AEROBIC AND ANAEROBIC Blood Culture adequate volume   Culture   Final    NO GROWTH 3 DAYS Performed at [facility], [place] [address], [place], [place] [address]    Report Status PENDING  Incomplete  MRSA PCR Screening     Status: None   Collection Time: 04/23/17 12:16 AM  Result Value Ref Range Status   MRSA by PCR NEGATIVE NEGATIVE Final    Comment:        The GeneXpert MRSA Assay (FDA approved for NASAL specimens only), is one component of a comprehensive MRSA colonization surveillance program. It is not intended to diagnose MRSA infection nor to guide or monitor treatment for MRSA infections.   Culture, blood (x 2)     Status: None (Preliminary result)   Collection Time: 04/23/17 12:48 AM  Result Value Ref Range Status   Specimen Description BLOOD LEFT ANTECUBITAL  Final   Special Requests   Final    BOTTLES DRAWN AEROBIC AND ANAEROBIC Blood Culture adequate volume   Culture   Final    NO GROWTH 2 DAYS Performed at [facility], [address] [place], [place] [address]    Report Status PENDING  Incomplete   Culture, blood (x 2)     Status: None (Preliminary result)   Collection Time: 04/23/17  1:04 AM  Result Value Ref Range Status   Specimen Description BLOOD RIGHT ANTECUBITAL  Final   Special Requests   Final    BOTTLES DRAWN AEROBIC AND ANAEROBIC Blood Culture adequate volume   Culture   Final    NO GROWTH 2 DAYS Performed at [facility], [place] [address], [place], [place] [address]    Report Status PENDING  Incomplete  Culture, Urine     Status: None   Collection Time: 04/23/17 12:31 PM  Result Value Ref Range Status   Specimen Description URINE, RANDOM  Final   Special Requests NONE  Final   Culture   Final    NO GROWTH Performed at [facility], [place] [address] [place], [place] [address]    Report Status 04/24/2017 FINAL  Final  C difficile quick scan w PCR reflex     Status: None   Collection Time: 04/24/17  8:17 PM  Result Value Ref Range Status   C  Diff antigen NEGATIVE NEGATIVE Final   C Diff toxin NEGATIVE NEGATIVE Final   C Diff interpretation No C. difficile detected.  Final     Time coordinating discharge: 35 minutes  SIGNED:   Aline August, MD  [facility] 04/26/2017, 10:00 AM Pager: [phone]  If 7PM-7AM, please contact night-coverage [email or web address] Password [email or web address]

## 2017-04-26 NOTE — Progress Notes (Signed)
Pt states declined HH at present time.

## 2017-04-27 LAB — CULTURE, BLOOD (ROUTINE X 2)
Culture: NO GROWTH
Culture: NO GROWTH
SPECIAL REQUESTS: ADEQUATE
Special Requests: ADEQUATE

## 2017-04-28 LAB — CULTURE, BLOOD (ROUTINE X 2)
CULTURE: NO GROWTH
Culture: NO GROWTH
SPECIAL REQUESTS: ADEQUATE
Special Requests: ADEQUATE

## 2017-04-28 MED FILL — AMOX-CLAV 875-125 MG TABLET: 875-125 | 7 days supply | Qty: 14 | Fill #0

## 2017-04-28 MED FILL — ?ONDANSETRON HCL 4MG TABLET: 4 | 5 days supply | Qty: 20 | Fill #0

## 2017-05-27 ENCOUNTER — Encounter: Payer: Self-pay | Admitting: Internal Medicine

## 2017-05-27 ENCOUNTER — Ambulatory Visit: Payer: Medicare Other | Attending: Internal Medicine | Admitting: Internal Medicine

## 2017-05-27 VITALS — BP 131/85 | HR 87 | Temp 97.9°F | Resp 16 | Ht 66.0 in | Wt 133.2 lb

## 2017-05-27 DIAGNOSIS — Z87891 Personal history of nicotine dependence: Secondary | ICD-10-CM | POA: Diagnosis not present

## 2017-05-27 DIAGNOSIS — J449 Chronic obstructive pulmonary disease, unspecified: Secondary | ICD-10-CM | POA: Insufficient documentation

## 2017-05-27 DIAGNOSIS — I2510 Atherosclerotic heart disease of native coronary artery without angina pectoris: Secondary | ICD-10-CM | POA: Diagnosis not present

## 2017-05-27 DIAGNOSIS — Z9889 Other specified postprocedural states: Secondary | ICD-10-CM | POA: Diagnosis not present

## 2017-05-27 DIAGNOSIS — Z79899 Other long term (current) drug therapy: Secondary | ICD-10-CM | POA: Diagnosis not present

## 2017-05-27 DIAGNOSIS — I252 Old myocardial infarction: Secondary | ICD-10-CM | POA: Diagnosis not present

## 2017-05-27 DIAGNOSIS — E785 Hyperlipidemia, unspecified: Secondary | ICD-10-CM | POA: Diagnosis not present

## 2017-05-27 DIAGNOSIS — I10 Essential (primary) hypertension: Secondary | ICD-10-CM | POA: Diagnosis not present

## 2017-05-27 DIAGNOSIS — C679 Malignant neoplasm of bladder, unspecified: Secondary | ICD-10-CM

## 2017-05-27 DIAGNOSIS — K219 Gastro-esophageal reflux disease without esophagitis: Secondary | ICD-10-CM | POA: Diagnosis not present

## 2017-05-27 DIAGNOSIS — Z888 Allergy status to other drugs, medicaments and biological substances status: Secondary | ICD-10-CM | POA: Insufficient documentation

## 2017-05-27 DIAGNOSIS — Z886 Allergy status to analgesic agent status: Secondary | ICD-10-CM | POA: Diagnosis not present

## 2017-05-27 DIAGNOSIS — Z8674 Personal history of sudden cardiac arrest: Secondary | ICD-10-CM | POA: Diagnosis not present

## 2017-05-27 DIAGNOSIS — Z87440 Personal history of urinary (tract) infections: Secondary | ICD-10-CM | POA: Insufficient documentation

## 2017-05-27 DIAGNOSIS — Z8249 Family history of ischemic heart disease and other diseases of the circulatory system: Secondary | ICD-10-CM | POA: Diagnosis not present

## 2017-05-27 DIAGNOSIS — Z955 Presence of coronary angioplasty implant and graft: Secondary | ICD-10-CM | POA: Diagnosis not present

## 2017-05-27 DIAGNOSIS — I472 Ventricular tachycardia: Secondary | ICD-10-CM | POA: Diagnosis not present

## 2017-05-27 DIAGNOSIS — Z7982 Long term (current) use of aspirin: Secondary | ICD-10-CM | POA: Insufficient documentation

## 2017-05-27 MED ORDER — BISOPROLOL FUMARATE 5 MG PO TABS: 2.5000 mg | ORAL_TABLET | Freq: Every day | ORAL | 6 refills | Status: DC

## 2017-05-27 NOTE — Progress Notes (Signed)
Patient ID: Timothy [name], male    DOB: 07/13/1955  MRN: [id number]  CC: Hypertension   Subjective: Timothy [name] is a 62 y.o. male who presents for chronic ds management and hosp f/u His concerns today include:  62 yr old with hx of HTN, cardiac arrest vfib/vtach 06/2013, CAD with stentto RCA, COPD Gold stage III, former smoker, pre-DM, lung nodule on CT 08/2015 and bladder CA.  1.  Bladder CA: Since last visit patient had transurethral resection of bladder tumor.  Course was complicated with perforation of the bladder requiring Foley catheter and antibiotics.he was hospitalized a second time with sepsis secondary to pyelonephritis.  Today he reports he is doing much better.  Has f/u with [facility] 07/2017  2.  CAD: saw cardiology prior to bladder surgery and had low risks Myoview study.  Echo with normal EF and grade 2 diastolic dysfunction.   She denies any chest pains or shortness of breath at this time.  Compliant with Lipitor, aspirin, bisoprolol  3.  COPD:  Doing ok on current inhalers.  No recent flares.  Patient Active Problem List   Diagnosis Date Noted  . Acute lower UTI 04/23/2017  . Sepsis ([place]) 04/22/2017  . Hematuria, gross 04/01/2017  . Immunization due 02/23/2017  . Malignant neoplasm of urinary bladder ([place]) 02/23/2017  . Gross hematuria 11/20/2016  . Lung nodule 11/20/2016  . Gastroesophageal reflux disease without esophagitis 11/20/2016  . Old MI (myocardial infarction) 01/19/2015  . Prediabetes 12/29/2013  . Dental cavities 12/29/2013  . HTN (hypertension) 08/03/2013  . CAD (coronary artery disease) 06/19/2013  . Hyperlipidemia with target LDL less than 70 06/19/2013  . Tobacco abuse 06/19/2013  . NSTEMI (non-ST elevated myocardial infarction), DES to RCA 06/13/2013  . Cardiac arrest, hypothermic protocol, vfib/vtach.  Resp arrest 06/12/2013  . VT (ventricular tachycardia), requiring 2 shocks 06/12/2013  . COPD GOLD III 06/12/2013     Current Outpatient  Medications on File Prior to Visit  Medication Sig Dispense Refill  . albuterol (VENTOLIN HFA) 108 (90 Base) MCG/ACT inhaler Inhale 2 puffs into the lungs every 6 (six) hours as needed for wheezing or shortness of breath. 54 g 12  . aspirin EC 81 MG tablet Take 1 tablet (81 mg total) by mouth daily. 90 tablet 2  . atorvastatin (LIPITOR) 40 MG tablet Take 1 tablet (40 mg total) by mouth every evening.    . budesonide-formoterol (SYMBICORT) 160-4.5 MCG/ACT inhaler Inhale 2 puffs 2 (two) times daily into the lungs.    . nitroGLYCERIN (NITROSTAT) 0.4 MG SL tablet Place 1 tablet (0.4 mg total) under the tongue every 5 (five) minutes as needed for chest pain. NEED OV. 25 tablet 0  . ondansetron (ZOFRAN) 4 MG tablet Take 1 tablet (4 mg total) by mouth every 6 (six) hours as needed for nausea. 20 tablet 0  . pantoprazole (PROTONIX) 20 MG tablet Take 1 tablet (20 mg total) by mouth daily. 30 tablet 4  . acetaminophen (TYLENOL) 500 MG tablet Take 1,000 mg every 4 (four) hours as needed by mouth for mild pain.    Marland Kitchen albuterol (PROVENTIL) (2.5 MG/3ML) 0.083% nebulizer solution Take 3 mLs (2.5 mg total) by nebulization every 6 (six) hours as needed for wheezing or shortness of breath. 150 mL 1   No current facility-administered medications on file prior to visit.     Allergies  Allergen Reactions  . Ace Inhibitors     Wheezing, prior notes  . Aspirin Swelling    To  lips.  . Ibuprofen Swelling    To lips.  . Losartan Other (See Comments)    Leg cramps    Social History   Socioeconomic History  . Marital status: Divorced    Spouse name: Not on file  . Number of children: Not on file  . Years of education: Not on file  . Highest education level: Not on file  Social Needs  . Financial resource strain: Not on file  . Food insecurity - worry: Not on file  . Food insecurity - inability: Not on file  . Transportation needs - medical: Not on file  . Transportation needs - non-medical: Not on file    Occupational History  . Not on file  Tobacco Use  . Smoking status: Former Smoker    Packs/day: 2.00    Years: 38.00    Pack years: 76.00    Types: Cigarettes    Last attempt to quit: 07/10/2008    Years since quitting: 8.8  . Smokeless tobacco: Never Used  Substance and Sexual Activity  . Alcohol use: No  . Drug use: No  . Sexual activity: Yes  Other Topics Concern  . Not on file  Social History Narrative  . Not on file    Family History  Problem Relation Age of Onset  . Hypertension Other     Past Surgical History:  Procedure Laterality Date  . cardiac sstent    . CORONARY ANGIOPLASTY WITH STENT PLACEMENT  06/12/13   Xience Alpine DES to RCA, NSTEMI  . CYSTOSCOPY W/ RETROGRADES Left 03/29/2017   Procedure: CYSTOSCOPY WITH LEFT RETROGRADE PYELOGRAM;  Surgeon: Kathie Rhodes, MD;  Location: [facility];  Service: Urology;  Laterality: Left;  . LEFT HEART CATHETERIZATION WITH CORONARY ANGIOGRAM N/A 06/12/2013   Procedure: LEFT HEART CATHETERIZATION WITH CORONARY ANGIOGRAM;  Surgeon: Troy Sine, MD;  Location[facility] C[facility] LAB;  Service: Cardiovascular;  Laterality: N/A;  . NO PAST SURGERIES    . TRANSURETHRAL RESECTION OF BLADDER TUMOR N/A 03/29/2017   Procedure: TRANSURETHRAL RESECTION OF BLADDER TUMOR (TURBT)/;  Surgeon: Kathie Rhodes, MD;  Location: [facility];  Service: Urology;  Laterality: N/A;  . transurethral ressection of bladder tumor     Dr. Karsten Ro 03/29/17    ROS: Review of Systems  Constitutional: Positive for appetite change. Negative for activity change (eating more and has gained wgh) and fever.  Gastrointestinal: Negative for constipation.  Genitourinary: Negative for difficulty urinating.    PHYSICAL EXAM: BP 131/85   Pulse 87   Temp 97.9 F (36.6 C) (Oral)   Resp 16   Ht 5\' 6"  (1.676 m)   Wt 133 lb 3.2 oz (60.4 kg)   SpO2 95%   BMI 21.50 kg/m   Wt Readings from Last 3 Encounters:  05/27/17 133 lb 3.2 oz (60.4 kg)  04/26/17 121 lb 0.5 oz (54.9 kg)   03/30/17 123 lb (55.8 kg)   Physical Exam General appearance - alert, well appearing, older caucasian male and in no distress Mental status - alert, oriented to person, place, and time, normal mood, behavior, speech, dress, motor activity, and thought processes Neck - supple, no significant adenopathy Chest - clear to auscultation, no wheezes, rales or rhonchi, symmetric air entry Heart - normal rate, regular rhythm, normal S1, S2, no murmurs, rubs, clicks or gallops Extremities - peripheral pulses normal, no pedal edema, no clubbing or cyanosis   ASSESSMENT AND PLAN: 1. Coronary artery disease of native artery of native heart without angina pectoris (HCC) Stable.  Continue bisoprolol, aspirin, and Lipitor - bisoprolol (ZEBETA) 5 MG tablet; Take 0.5 tablets (2.5 mg total) by mouth at bedtime.  Dispense: 45 tablet; Refill: 6  2. Essential hypertension - bisoprolol (ZEBETA) 5 MG tablet; Take 0.5 tablets (2.5 mg total) by mouth at bedtime.  Dispense: 45 tablet; Refill: 6 - Basic Metabolic Panel  3. COPD GOLD III Stable on Ventolin and Symbicort.  4. Malignant neoplasm of urinary bladder, unspecified site [facility]) Followed by urology.  Patient was given the opportunity to ask questions.  Patient verbalized understanding of the plan and was able to repeat key elements of the plan.   Orders Placed This Encounter  Procedures  . Basic Metabolic Panel     Requested Prescriptions   Signed Prescriptions Disp Refills  . bisoprolol (ZEBETA) 5 MG tablet 45 tablet 6    Sig: Take 0.5 tablets (2.5 mg total) by mouth at bedtime.    Return in about 4 months (around 09/24/2017).  Karle Plumber, MD, FACP

## 2017-05-28 LAB — BASIC METABOLIC PANEL
BUN/Creatinine Ratio: 21 (ref 10–24)
BUN: 17 mg/dL (ref 8–27)
CHLORIDE: 98 mmol/L (ref 96–106)
CO2: 25 mmol/L (ref 20–29)
CREATININE: 0.82 mg/dL (ref 0.76–1.27)
Calcium: 9.9 mg/dL (ref 8.6–10.2)
GFR calc Af Amer: 111 mL/min/{1.73_m2} (ref >59)
GFR calc non Af Amer: 96 mL/min/{1.73_m2} (ref >59)
GLUCOSE: 104 mg/dL — AB (ref 65–99)
Potassium: 5 mmol/L (ref 3.5–5.2)
SODIUM: 141 mmol/L (ref 134–144)

## 2017-07-20 DIAGNOSIS — R8279 Other abnormal findings on microbiological examination of urine: Secondary | ICD-10-CM | POA: Diagnosis not present

## 2017-07-20 DIAGNOSIS — Z8551 Personal history of malignant neoplasm of bladder: Secondary | ICD-10-CM | POA: Diagnosis not present

## 2017-07-20 DIAGNOSIS — N35011 Post-traumatic bulbous urethral stricture: Secondary | ICD-10-CM | POA: Diagnosis not present

## 2017-07-22 ENCOUNTER — Telehealth: Payer: Self-pay | Admitting: Internal Medicine

## 2017-07-22 MED FILL — AMOX-CLAV 875-125 MG TABLET: 875-125 | 7 days supply | Qty: 14 | Fill #0

## 2017-07-22 NOTE — Telephone Encounter (Signed)
5 page, paperwork received through fax 07-22-17.

## 2017-10-26 DIAGNOSIS — N35013 Post-traumatic anterior urethral stricture: Secondary | ICD-10-CM | POA: Diagnosis not present

## 2017-10-26 DIAGNOSIS — Z8551 Personal history of malignant neoplasm of bladder: Secondary | ICD-10-CM | POA: Diagnosis not present

## 2017-10-26 DIAGNOSIS — R8279 Other abnormal findings on microbiological examination of urine: Secondary | ICD-10-CM | POA: Diagnosis not present

## 2017-10-28 MED FILL — AMOX-CLAV 875-125 MG TABLET: 875-125 | 7 days supply | Qty: 14 | Fill #0

## 2018-01-25 DIAGNOSIS — N35013 Post-traumatic anterior urethral stricture: Secondary | ICD-10-CM | POA: Diagnosis not present

## 2018-01-25 DIAGNOSIS — Z8551 Personal history of malignant neoplasm of bladder: Secondary | ICD-10-CM | POA: Diagnosis not present

## 2018-04-19 DIAGNOSIS — N35013 Post-traumatic anterior urethral stricture: Secondary | ICD-10-CM | POA: Diagnosis not present

## 2018-04-19 DIAGNOSIS — R8279 Other abnormal findings on microbiological examination of urine: Secondary | ICD-10-CM | POA: Diagnosis not present

## 2018-04-19 DIAGNOSIS — Z8551 Personal history of malignant neoplasm of bladder: Secondary | ICD-10-CM | POA: Diagnosis not present

## 2018-04-21 MED FILL — AMOX-CLAV 500-125 MG TABLET: 500-125 | 7 days supply | Qty: 14 | Fill #0

## 2018-11-01 DIAGNOSIS — Z8551 Personal history of malignant neoplasm of bladder: Secondary | ICD-10-CM | POA: Diagnosis not present

## 2018-11-01 DIAGNOSIS — N35013 Post-traumatic anterior urethral stricture: Secondary | ICD-10-CM | POA: Diagnosis not present

## 2018-11-01 DIAGNOSIS — R8279 Other abnormal findings on microbiological examination of urine: Secondary | ICD-10-CM | POA: Diagnosis not present

## 2018-11-03 MED FILL — AMOX-CLAV 500-125 MG TABLET: 500-125 | 7 days supply | Qty: 14 | Fill #0

## 2020-02-24 ENCOUNTER — Inpatient Hospital Stay (HOSPITAL_COMMUNITY)
Admission: EM | Admit: 2020-02-24 | Discharge: 2020-02-25 | DRG: 246 | Disposition: A | Discharge status: Home / Self Care | Payer: Medicare Other | Attending: Interventional Cardiology | Admitting: Interventional Cardiology

## 2020-02-24 ENCOUNTER — Encounter (HOSPITAL_COMMUNITY): Payer: Self-pay | Admitting: Emergency Medicine

## 2020-02-24 ENCOUNTER — Other Ambulatory Visit: Payer: Self-pay

## 2020-02-24 ENCOUNTER — Emergency Department (HOSPITAL_COMMUNITY): Payer: Medicare Other

## 2020-02-24 ENCOUNTER — Inpatient Hospital Stay (HOSPITAL_COMMUNITY)
Admission: EM | Disposition: A | Discharge status: Home / Self Care | Payer: Self-pay | Source: Home / Self Care | Attending: Interventional Cardiology

## 2020-02-24 ENCOUNTER — Inpatient Hospital Stay (HOSPITAL_COMMUNITY): Payer: Medicare Other

## 2020-02-24 DIAGNOSIS — K219 Gastro-esophageal reflux disease without esophagitis: Secondary | ICD-10-CM | POA: Diagnosis not present

## 2020-02-24 DIAGNOSIS — I2582 Chronic total occlusion of coronary artery: Secondary | ICD-10-CM | POA: Diagnosis not present

## 2020-02-24 DIAGNOSIS — Z20822 Contact with and (suspected) exposure to covid-19: Secondary | ICD-10-CM | POA: Diagnosis present

## 2020-02-24 DIAGNOSIS — Z9861 Coronary angioplasty status: Secondary | ICD-10-CM | POA: Diagnosis present

## 2020-02-24 DIAGNOSIS — J90 Pleural effusion, not elsewhere classified: Secondary | ICD-10-CM | POA: Diagnosis not present

## 2020-02-24 DIAGNOSIS — Z888 Allergy status to other drugs, medicaments and biological substances status: Secondary | ICD-10-CM

## 2020-02-24 DIAGNOSIS — I252 Old myocardial infarction: Secondary | ICD-10-CM | POA: Diagnosis not present

## 2020-02-24 DIAGNOSIS — J439 Emphysema, unspecified: Secondary | ICD-10-CM | POA: Diagnosis not present

## 2020-02-24 DIAGNOSIS — J449 Chronic obstructive pulmonary disease, unspecified: Secondary | ICD-10-CM | POA: Diagnosis not present

## 2020-02-24 DIAGNOSIS — I213 ST elevation (STEMI) myocardial infarction of unspecified site: Secondary | ICD-10-CM

## 2020-02-24 DIAGNOSIS — I249 Acute ischemic heart disease, unspecified: Secondary | ICD-10-CM | POA: Diagnosis not present

## 2020-02-24 DIAGNOSIS — E785 Hyperlipidemia, unspecified: Secondary | ICD-10-CM

## 2020-02-24 DIAGNOSIS — Z886 Allergy status to analgesic agent status: Secondary | ICD-10-CM

## 2020-02-24 DIAGNOSIS — R0789 Other chest pain: Secondary | ICD-10-CM | POA: Diagnosis not present

## 2020-02-24 DIAGNOSIS — I2119 ST elevation (STEMI) myocardial infarction involving other coronary artery of inferior wall: Secondary | ICD-10-CM

## 2020-02-24 DIAGNOSIS — Z955 Presence of coronary angioplasty implant and graft: Secondary | ICD-10-CM

## 2020-02-24 DIAGNOSIS — Z7982 Long term (current) use of aspirin: Secondary | ICD-10-CM | POA: Diagnosis not present

## 2020-02-24 DIAGNOSIS — Z8249 Family history of ischemic heart disease and other diseases of the circulatory system: Secondary | ICD-10-CM

## 2020-02-24 DIAGNOSIS — R7303 Prediabetes: Secondary | ICD-10-CM | POA: Diagnosis present

## 2020-02-24 DIAGNOSIS — T82855A Stenosis of coronary artery stent, initial encounter: Secondary | ICD-10-CM | POA: Diagnosis not present

## 2020-02-24 DIAGNOSIS — I10 Essential (primary) hypertension: Secondary | ICD-10-CM

## 2020-02-24 DIAGNOSIS — I2510 Atherosclerotic heart disease of native coronary artery without angina pectoris: Secondary | ICD-10-CM

## 2020-02-24 DIAGNOSIS — Z79899 Other long term (current) drug therapy: Secondary | ICD-10-CM | POA: Diagnosis not present

## 2020-02-24 DIAGNOSIS — R079 Chest pain, unspecified: Secondary | ICD-10-CM | POA: Diagnosis not present

## 2020-02-24 DIAGNOSIS — I499 Cardiac arrhythmia, unspecified: Secondary | ICD-10-CM | POA: Diagnosis not present

## 2020-02-24 DIAGNOSIS — I25118 Atherosclerotic heart disease of native coronary artery with other forms of angina pectoris: Secondary | ICD-10-CM

## 2020-02-24 DIAGNOSIS — Z7951 Long term (current) use of inhaled steroids: Secondary | ICD-10-CM | POA: Diagnosis not present

## 2020-02-24 DIAGNOSIS — I21A9 Other myocardial infarction type: Secondary | ICD-10-CM | POA: Diagnosis not present

## 2020-02-24 DIAGNOSIS — Z87891 Personal history of nicotine dependence: Secondary | ICD-10-CM

## 2020-02-24 DIAGNOSIS — I2111 ST elevation (STEMI) myocardial infarction involving right coronary artery: Secondary | ICD-10-CM

## 2020-02-24 DIAGNOSIS — Z8674 Personal history of sudden cardiac arrest: Secondary | ICD-10-CM | POA: Diagnosis not present

## 2020-02-24 DIAGNOSIS — Z720 Tobacco use: Secondary | ICD-10-CM | POA: Diagnosis present

## 2020-02-24 HISTORY — DX: Atherosclerotic heart disease of native coronary artery without angina pectoris: I25.10

## 2020-02-24 HISTORY — PX: CORONARY/GRAFT ACUTE MI REVASCULARIZATION: CATH118305

## 2020-02-24 HISTORY — PX: CORONARY STENT INTERVENTION: CATH118234

## 2020-02-24 HISTORY — PX: LEFT HEART CATH AND CORONARY ANGIOGRAPHY: CATH118249

## 2020-02-24 LAB — COMPREHENSIVE METABOLIC PANEL
ALT: 21 U/L (ref 0–44)
AST: 17 U/L (ref 15–41)
Albumin: 3.8 g/dL (ref 3.5–5.0)
Alkaline Phosphatase: 78 U/L (ref 38–126)
Anion gap: 11 (ref 5–15)
BUN: 17 mg/dL (ref 8–23)
CO2: 25 mmol/L (ref 22–32)
Calcium: 9.4 mg/dL (ref 8.9–10.3)
Chloride: 102 mmol/L (ref 98–111)
Creatinine, Ser: 1.12 mg/dL (ref 0.61–1.24)
GFR, Estimated: >60 mL/min (ref >60)
Glucose, Bld: 152 mg/dL — ABNORMAL HIGH (ref 70–99)
Potassium: 4.1 mmol/L (ref 3.5–5.1)
Sodium: 138 mmol/L (ref 135–145)
Total Bilirubin: 0.5 mg/dL (ref 0.3–1.2)
Total Protein: 7 g/dL (ref 6.5–8.1)

## 2020-02-24 LAB — CBC WITH DIFFERENTIAL/PLATELET
Abs Immature Granulocytes: 0.23 10*3/uL — ABNORMAL HIGH (ref 0.00–0.07)
Basophils Absolute: 0.1 10*3/uL (ref 0.0–0.1)
Basophils Relative: 1 %
Eosinophils Absolute: 0.4 10*3/uL (ref 0.0–0.5)
Eosinophils Relative: 2 %
HCT: 52.2 % — ABNORMAL HIGH (ref 39.0–52.0)
Hemoglobin: 17.2 g/dL — ABNORMAL HIGH (ref 13.0–17.0)
Immature Granulocytes: 1 %
Lymphocytes Relative: 25 %
Lymphs Abs: 4.1 10*3/uL — ABNORMAL HIGH (ref 0.7–4.0)
MCH: 29.9 pg (ref 26.0–34.0)
MCHC: 33 g/dL (ref 30.0–36.0)
MCV: 90.6 fL (ref 80.0–100.0)
Monocytes Absolute: 1.6 10*3/uL — ABNORMAL HIGH (ref 0.1–1.0)
Monocytes Relative: 10 %
Neutro Abs: 10.3 10*3/uL — ABNORMAL HIGH (ref 1.7–7.7)
Neutrophils Relative %: 61 %
Platelets: 383 10*3/uL (ref 150–400)
RBC: 5.76 MIL/uL (ref 4.22–5.81)
RDW: 13.1 % (ref 11.5–15.5)
WBC: 16.7 10*3/uL — ABNORMAL HIGH (ref 4.0–10.5)
nRBC: 0 % (ref 0.0–0.2)

## 2020-02-24 LAB — PROTIME-INR
INR: 0.9 (ref 0.8–1.2)
Prothrombin Time: 11.9 seconds (ref 11.4–15.2)

## 2020-02-24 LAB — CREATININE, SERUM
Creatinine, Ser: 1.05 mg/dL (ref 0.61–1.24)
GFR, Estimated: >60 mL/min

## 2020-02-24 LAB — LIPID PANEL
Cholesterol: 268 mg/dL — ABNORMAL HIGH (ref 0–200)
HDL: 48 mg/dL (ref >40)
Total CHOL/HDL Ratio: 5.6 RATIO
Triglycerides: 212 mg/dL — ABNORMAL HIGH (ref <150)
VLDL: 42 mg/dL — ABNORMAL HIGH (ref 0–40)

## 2020-02-24 LAB — TROPONIN I (HIGH SENSITIVITY)
Troponin I (High Sensitivity): 5 ng/L (ref <18)
Troponin I (High Sensitivity): 2628 ng/L (ref <18)
Troponin I (High Sensitivity): 8547 ng/L
Troponin I (High Sensitivity): 12588 ng/L (ref <18)

## 2020-02-24 LAB — CBC
HCT: 47.3 % (ref 39.0–52.0)
Hemoglobin: 15.9 g/dL (ref 13.0–17.0)
MCH: 30.2 pg (ref 26.0–34.0)
MCHC: 33.6 g/dL (ref 30.0–36.0)
MCV: 89.8 fL (ref 80.0–100.0)
Platelets: 344 10*3/uL (ref 150–400)
RBC: 5.27 MIL/uL (ref 4.22–5.81)
RDW: 13 % (ref 11.5–15.5)
WBC: 19.5 10*3/uL — ABNORMAL HIGH (ref 4.0–10.5)
nRBC: 0 % (ref 0.0–0.2)

## 2020-02-24 LAB — RESPIRATORY PANEL BY RT PCR (FLU A&B, COVID)
Influenza A by PCR: NEGATIVE
Influenza B by PCR: NEGATIVE
SARS Coronavirus 2 by RT PCR: NEGATIVE

## 2020-02-24 LAB — ECHOCARDIOGRAM COMPLETE
Area-P 1/2: 3.65 cm2
Height: 66 in
S' Lateral: 2.9 cm
Weight: 2130.53 oz

## 2020-02-24 LAB — APTT: aPTT: 25 seconds (ref 24–36)

## 2020-02-24 LAB — MRSA PCR SCREENING: MRSA by PCR: NEGATIVE

## 2020-02-24 SURGERY — CORONARY/GRAFT ACUTE MI REVASCULARIZATION
Anesthesia: LOCAL

## 2020-02-24 MED ORDER — SODIUM CHLORIDE 0.9 % IV SOLN: 250.0000 mL | INTRAVENOUS | Status: DC | PRN

## 2020-02-24 MED ORDER — OXYCODONE HCL 5 MG PO TABS: 5.0000 mg | ORAL_TABLET | ORAL | Status: DC | PRN

## 2020-02-24 MED ORDER — ASPIRIN 81 MG PO CHEW
81.0000 mg | CHEWABLE_TABLET | Freq: Every day | ORAL | Status: DC
  Administered 2020-02-25: 81 mg via ORAL
  Filled 2020-02-24: qty 1

## 2020-02-24 MED ORDER — VERAPAMIL HCL 2.5 MG/ML IV SOLN
INTRAVENOUS | Status: DC | PRN
  Administered 2020-02-24: 10 mL via INTRA_ARTERIAL

## 2020-02-24 MED ORDER — LIDOCAINE HCL (PF) 1 % IJ SOLN
INTRAMUSCULAR | Status: AC
  Filled 2020-02-24: qty 30

## 2020-02-24 MED ORDER — VERAPAMIL HCL 2.5 MG/ML IV SOLN
INTRAVENOUS | Status: AC
  Filled 2020-02-24: qty 2

## 2020-02-24 MED ORDER — NITROGLYCERIN IN D5W 200-5 MCG/ML-% IV SOLN: 10.0000 ug/min | INTRAVENOUS | Status: DC

## 2020-02-24 MED ORDER — SODIUM CHLORIDE 0.9 % IV SOLN: INTRAVENOUS | Status: DC

## 2020-02-24 MED ORDER — SODIUM CHLORIDE 0.9% FLUSH: 3.0000 mL | INTRAVENOUS | Status: DC | PRN

## 2020-02-24 MED ORDER — SODIUM CHLORIDE 0.9 % IV BOLUS
1000.0000 mL | Freq: Once | INTRAVENOUS | Status: AC
  Administered 2020-02-24: 1000 mL via INTRAVENOUS

## 2020-02-24 MED ORDER — SODIUM CHLORIDE 0.9% FLUSH
3.0000 mL | Freq: Two times a day (BID) | INTRAVENOUS | Status: DC
  Administered 2020-02-25: 10 mL via INTRAVENOUS
  Administered 2020-02-25: 3 mL via INTRAVENOUS

## 2020-02-24 MED ORDER — FENTANYL CITRATE (PF) 100 MCG/2ML IJ SOLN
INTRAMUSCULAR | Status: AC
  Administered 2020-02-24: 50 ug
  Filled 2020-02-24: qty 2

## 2020-02-24 MED ORDER — IOHEXOL 350 MG/ML SOLN
INTRAVENOUS | Status: AC
  Filled 2020-02-24: qty 1

## 2020-02-24 MED ORDER — PANTOPRAZOLE SODIUM 20 MG PO TBEC
20.0000 mg | DELAYED_RELEASE_TABLET | Freq: Every day | ORAL | Status: DC
  Administered 2020-02-24 – 2020-02-25 (×2): 20 mg via ORAL
  Filled 2020-02-24 (×3): qty 1

## 2020-02-24 MED ORDER — NITROGLYCERIN 0.4 MG SL SUBL
SUBLINGUAL_TABLET | SUBLINGUAL | Status: AC
  Administered 2020-02-24: 0.4 mg
  Filled 2020-02-24: qty 1

## 2020-02-24 MED ORDER — HYDRALAZINE HCL 20 MG/ML IJ SOLN: 10.0000 mg | INTRAMUSCULAR | Status: AC | PRN

## 2020-02-24 MED ORDER — NITROGLYCERIN IN D5W 200-5 MCG/ML-% IV SOLN
INTRAVENOUS | Status: AC | PRN
  Administered 2020-02-24: 10 ug/min via INTRAVENOUS

## 2020-02-24 MED ORDER — NITROGLYCERIN 1 MG/10 ML FOR IR/CATH LAB
INTRA_ARTERIAL | Status: AC
  Filled 2020-02-24: qty 10

## 2020-02-24 MED ORDER — ONDANSETRON HCL 4 MG/2ML IJ SOLN: 4.0000 mg | Freq: Four times a day (QID) | INTRAMUSCULAR | Status: DC | PRN

## 2020-02-24 MED ORDER — HEPARIN SODIUM (PORCINE) 5000 UNIT/ML IJ SOLN
5000.0000 [IU] | Freq: Three times a day (TID) | INTRAMUSCULAR | Status: DC
  Administered 2020-02-24 – 2020-02-25 (×2): 5000 [IU] via SUBCUTANEOUS
  Filled 2020-02-24 (×3): qty 1

## 2020-02-24 MED ORDER — MOMETASONE FURO-FORMOTEROL FUM 200-5 MCG/ACT IN AERO
2.0000 | INHALATION_SPRAY | Freq: Two times a day (BID) | RESPIRATORY_TRACT | Status: DC
  Administered 2020-02-24 – 2020-02-25 (×2): 2 via RESPIRATORY_TRACT
  Filled 2020-02-24 (×2): qty 8.8

## 2020-02-24 MED ORDER — CHLORHEXIDINE GLUCONATE CLOTH 2 % EX PADS
6.0000 | MEDICATED_PAD | Freq: Every day | CUTANEOUS | Status: DC
  Administered 2020-02-24 – 2020-02-25 (×2): 6 via TOPICAL

## 2020-02-24 MED ORDER — TICAGRELOR 90 MG PO TABS
90.0000 mg | ORAL_TABLET | Freq: Two times a day (BID) | ORAL | Status: DC
  Administered 2020-02-24 – 2020-02-25 (×2): 90 mg via ORAL
  Filled 2020-02-24 (×2): qty 1

## 2020-02-24 MED ORDER — TICAGRELOR 90 MG PO TABS
ORAL_TABLET | ORAL | Status: DC | PRN
  Administered 2020-02-24: 180 mg via ORAL

## 2020-02-24 MED ORDER — HEPARIN SODIUM (PORCINE) 1000 UNIT/ML IJ SOLN
INTRAMUSCULAR | Status: DC | PRN
  Administered 2020-02-24: 8000 [IU] via INTRAVENOUS

## 2020-02-24 MED ORDER — LIDOCAINE HCL (PF) 1 % IJ SOLN
INTRAMUSCULAR | Status: DC | PRN
  Administered 2020-02-24: 2 mL

## 2020-02-24 MED ORDER — HEPARIN (PORCINE) IN NACL 1000-0.9 UT/500ML-% IV SOLN
INTRAVENOUS | Status: DC | PRN
  Administered 2020-02-24: 500 mL

## 2020-02-24 MED ORDER — ALBUTEROL SULFATE (2.5 MG/3ML) 0.083% IN NEBU: 2.5000 mg | INHALATION_SOLUTION | Freq: Four times a day (QID) | RESPIRATORY_TRACT | Status: DC | PRN

## 2020-02-24 MED ORDER — BISOPROLOL FUMARATE 5 MG PO TABS
2.5000 mg | ORAL_TABLET | Freq: Every day | ORAL | Status: DC
  Administered 2020-02-24: 2.5 mg via ORAL
  Filled 2020-02-24: qty 1

## 2020-02-24 MED ORDER — ACETAMINOPHEN 325 MG PO TABS
650.0000 mg | ORAL_TABLET | ORAL | Status: DC | PRN
  Administered 2020-02-25: 650 mg via ORAL
  Filled 2020-02-24: qty 2

## 2020-02-24 MED ORDER — ATORVASTATIN CALCIUM 80 MG PO TABS
80.0000 mg | ORAL_TABLET | Freq: Every day | ORAL | Status: DC
  Administered 2020-02-24: 80 mg via ORAL
  Filled 2020-02-24: qty 1

## 2020-02-24 MED ORDER — ASPIRIN EC 81 MG PO TBEC: 81.0000 mg | DELAYED_RELEASE_TABLET | Freq: Every day | ORAL | Status: DC

## 2020-02-24 MED ORDER — HEPARIN SODIUM (PORCINE) 1000 UNIT/ML IJ SOLN
INTRAMUSCULAR | Status: AC
  Filled 2020-02-24: qty 1

## 2020-02-24 MED ORDER — NITROGLYCERIN IN D5W 200-5 MCG/ML-% IV SOLN
INTRAVENOUS | Status: AC
  Filled 2020-02-24: qty 250

## 2020-02-24 MED ORDER — HEPARIN SODIUM (PORCINE) 5000 UNIT/ML IJ SOLN
60.0000 [IU]/kg | Freq: Once | INTRAMUSCULAR | Status: AC
  Administered 2020-02-24: 3650 [IU] via INTRAVENOUS

## 2020-02-24 MED ORDER — LABETALOL HCL 5 MG/ML IV SOLN: 10.0000 mg | INTRAVENOUS | Status: AC | PRN

## 2020-02-24 MED ORDER — TICAGRELOR 90 MG PO TABS
ORAL_TABLET | ORAL | Status: AC
  Filled 2020-02-24: qty 2

## 2020-02-24 MED ORDER — IOHEXOL 350 MG/ML SOLN
INTRAVENOUS | Status: DC | PRN
  Administered 2020-02-24: 110 mL

## 2020-02-24 MED ORDER — SODIUM CHLORIDE 0.9 % WEIGHT BASED INFUSION
1.5000 mL/kg/h | INTRAVENOUS | Status: AC
  Administered 2020-02-24 (×2): 1.5 mL/kg/h via INTRAVENOUS

## 2020-02-24 MED ORDER — HEPARIN (PORCINE) IN NACL 1000-0.9 UT/500ML-% IV SOLN
INTRAVENOUS | Status: AC
  Filled 2020-02-24: qty 1000

## 2020-02-24 SURGICAL SUPPLY — 17 items
BALLN SAPPHIRE 2.5X12 (BALLOONS) ×2
BALLN SAPPHIRE ~~LOC~~ 3.0X18 (BALLOONS) ×1 IMPLANT
BALLOON SAPPHIRE 2.5X12 (BALLOONS) IMPLANT
CATH 5FR JL3.5 JR4 ANG PIG MP (CATHETERS) ×1 IMPLANT
CATH VISTA GUIDE 6FR JR4 (CATHETERS) ×1 IMPLANT
DEVICE RAD COMP TR BAND LRG (VASCULAR PRODUCTS) ×1 IMPLANT
GLIDESHEATH SLEND A-KIT 6F 22G (SHEATH) ×1 IMPLANT
GUIDEWIRE INQWIRE 1.5J.035X260 (WIRE) IMPLANT
INQWIRE 1.5J .035X260CM (WIRE) ×2
KIT ENCORE 26 ADVANTAGE (KITS) ×1 IMPLANT
KIT HEART LEFT (KITS) ×2 IMPLANT
PACK CARDIAC CATHETERIZATION (CUSTOM PROCEDURE TRAY) ×2 IMPLANT
SHEATH PROBE COVER 6X72 (BAG) ×1 IMPLANT
STENT RESOLUTE ONYX 2.75X34 (Permanent Stent) ×1 IMPLANT
TRANSDUCER W/STOPCOCK (MISCELLANEOUS) ×2 IMPLANT
TUBING CIL FLEX 10 FLL-RA (TUBING) ×2 IMPLANT
WIRE ASAHI PROWATER 180CM (WIRE) ×1 IMPLANT

## 2020-02-24 NOTE — ED Triage Notes (Signed)
Pt brought to ED by [facility] as a code STEMI. 1.5 hour c/o central cp, pale and diaphoretic BP 140/93, HR 82, R 20, SPO2 92 on 2L [place] no home O2 dependent. CBG 140

## 2020-02-24 NOTE — Progress Notes (Signed)
Echocardiogram 2D Echocardiogram has been performed.  Oneal Deputy Aailyah Dunbar 02/24/2020, 4:39 PM

## 2020-02-24 NOTE — H&P (Addendum)
Cardiology Admission History and Physical:   Patient ID: Timothy [name] MRN: [id number]; DOB: Sep 20, 1955   Admission date: 02/24/2020  Primary Care Provider: Ladell Pier, MD [facility] Cardiologist: Shelva Majestic, MD [facility] Electrophysiologist:  None   Chief Complaint:  STEMI  Patient Profile:   Timothy [name] is a 64 y.o. male with a history of cardiac arrest February 2015, successful resuscitation, hypothermia protocol, emergency angioplasty and stenting of the right coronary with overlapping 2.5 x 18 drug-eluting stents (Dr. Ellouise Newer), tobacco abuse, COPD, possible continued cognitive impairment, and history of allergies to aspirin and ACE inhibitor therapy   History of Present Illness:   Timothy [name] began having chest discomfort this morning.  He never specified exactly what time.  In fact the patient is not very conversant.  Not sure if this is new or related to prior injury from cardiac arrest in 2015.  In addition to the chest discomfort which started this morning, he denies arm pain, nausea, vomiting, shortness of breath, and syncope.  No palpitations.  He has history of prior stent.  History of aspirin allergy although says he successfully used 81 mg tablets after his stent in 2015.   Past Medical History:  Diagnosis Date  . Altered mental status, improved at discharge 06/19/2013  . Bronchitis, chronic ([place]) 06/19/2013  . Cancer ([place])    bladder  . Cardiac arrest, hypothermic protocol 06/12/2013  . COPD exacerbation ([place]) 06/12/2013  . GERD (gastroesophageal reflux disease)   . Hyperlipidemia LDL goal < 70 06/19/2013  . Hypokalemia, replaced 06/19/2013  . Hypomagnesemia, replaced 06/19/2013  . Hypotension, requiring levophed 06/19/2013  . NSTEMI (non-ST elevated myocardial infarction), DES to RCA 06/13/2013  . Pre-diabetes   . Tobacco abuse 06/19/2013    Past Surgical History:  Procedure Laterality Date  . cardiac sstent    . CORONARY ANGIOPLASTY WITH  STENT PLACEMENT  06/12/13   Xience Alpine DES to RCA, NSTEMI  . CYSTOSCOPY W/ RETROGRADES Left 03/29/2017   Procedure: CYSTOSCOPY WITH LEFT RETROGRADE PYELOGRAM;  Surgeon: Kathie Rhodes, MD;  Location: [facility];  Service: Urology;  Laterality: Left;  . LEFT HEART CATHETERIZATION WITH CORONARY ANGIOGRAM N/A 06/12/2013   Procedure: LEFT HEART CATHETERIZATION WITH CORONARY ANGIOGRAM;  Surgeon: Troy Sine, MD;  Location: [facility] CATH LAB;  Service: Cardiovascular;  Laterality: N/A;  . NO PAST SURGERIES    . TRANSURETHRAL RESECTION OF BLADDER TUMOR N/A 03/29/2017   Procedure: TRANSURETHRAL RESECTION OF BLADDER TUMOR (TURBT)/;  Surgeon: Kathie Rhodes, MD;  Location: [facility];  Service: Urology;  Laterality: N/A;  . transurethral ressection of bladder tumor     Dr. Karsten Ro 03/29/17     Medications Prior to Admission: Prior to Admission medications   Medication Sig Start Date End Date Taking? Authorizing Provider  acetaminophen (TYLENOL) 500 MG tablet Take 1,000 mg every 4 (four) hours as needed by mouth for mild pain.    [provider]  albuterol (PROVENTIL) (2.5 MG/3ML) 0.083% nebulizer solution Take 3 mLs (2.5 mg total) by nebulization every 6 (six) hours as needed for wheezing or shortness of breath. 11/20/16   Ladell Pier, MD  albuterol (VENTOLIN HFA) 108 (90 Base) MCG/ACT inhaler Inhale 2 puffs into the lungs every 6 (six) hours as needed for wheezing or shortness of breath. 02/23/17   Ladell Pier, MD  aspirin EC 81 MG tablet Take 1 tablet (81 mg total) by mouth daily. 10/21/15   Maren Reamer, MD  atorvastatin (LIPITOR) 40 MG tablet  Take 1 tablet (40 mg total) by mouth every evening. 04/26/17   Aline August, MD  bisoprolol (ZEBETA) 5 MG tablet Take 0.5 tablets (2.5 mg total) by mouth at bedtime. 05/27/17   Ladell Pier, MD  budesonide-formoterol (SYMBICORT) 160-4.5 MCG/ACT inhaler Inhale 2 puffs 2 (two) times daily into the lungs.    [provider]   nitroGLYCERIN (NITROSTAT) 0.4 MG SL tablet Place 1 tablet (0.4 mg total) under the tongue every 5 (five) minutes as needed for chest pain. NEED OV. 11/20/16   Ladell Pier, MD  ondansetron (ZOFRAN) 4 MG tablet Take 1 tablet (4 mg total) by mouth every 6 (six) hours as needed for nausea. 04/26/17   Aline August, MD  pantoprazole (PROTONIX) 20 MG tablet Take 1 tablet (20 mg total) by mouth daily. 02/23/17   Ladell Pier, MD     Allergies:    Allergies  Allergen Reactions  . Ace Inhibitors     Wheezing, prior notes  . Aspirin Swelling    To lips.  . Ibuprofen Swelling    To lips.  . Losartan Other (See Comments)    Leg cramps    Social History:   Social History   Socioeconomic History  . Marital status: Divorced    Spouse name: Not on file  . Number of children: Not on file  . Years of education: Not on file  . Highest education level: Not on file  Occupational History  . Not on file  Tobacco Use  . Smoking status: Former Smoker    Packs/day: 2.00    Years: 38.00    Pack years: 76.00    Types: Cigarettes    Quit date: 07/10/2008    Years since quitting: 11.6  . Smokeless tobacco: Never Used  Vaping Use  . Vaping Use: Never used  Substance and Sexual Activity  . Alcohol use: No  . Drug use: No    Types: Marijuana  . Sexual activity: Yes  Other Topics Concern  . Not on file  Social History Narrative  . Not on file   Social Determinants of Health   Financial Resource Strain:   . Difficulty of Paying Living Expenses: Not on file  Food Insecurity:   . Worried About Charity fundraiser in the Last Year: Not on file  . Ran Out of Food in the Last Year: Not on file  Transportation Needs:   . Lack of Transportation (Medical): Not on file  . Lack of Transportation (Non-Medical): Not on file  Physical Activity:   . Days of Exercise per Week: Not on file  . Minutes of Exercise per Session: Not on file  Stress:   . Feeling of Stress : Not on file  Social  Connections:   . Frequency of Communication with Friends and Family: Not on file  . Frequency of Social Gatherings with Friends and Family: Not on file  . Attends Religious Services: Not on file  . Active Member of Clubs or Organizations: Not on file  . Attends Archivist Meetings: Not on file  . Marital Status: Not on file  Intimate Partner Violence:   . Fear of Current or Ex-Partner: Not on file  . Emotionally Abused: Not on file  . Physically Abused: Not on file  . Sexually Abused: Not on file    Family History:   The patient's family history includes Hypertension in an other family member.    ROS:  Please see the history of present  illness.  No significant complaints but again the patient does not volunteer information.  All other ROS reviewed and negative.     Physical Exam/Data:   Vitals:   02/24/20 1346 02/24/20 1351 02/24/20 1356 02/24/20 1401  BP: (!) 154/82 (!) 145/84 (!) 145/85 140/85  Pulse: 92 90 89 88  Resp: (!) 6 16 (!) 8 18  Temp:      TempSrc:      SpO2: 99% 99% 99% 99%  Weight:      Height:       No intake or output data in the 24 hours ending 02/24/20 1421 Last 3 Weights 02/24/2020 05/27/2017 04/26/2017  Weight (lbs) 133 lb 2.5 oz 133 lb 3.2 oz 121 lb 0.5 oz  Weight (kg) 60.4 kg 60.419 kg 54.9 kg     Body mass index is 21.49 kg/m.  General:  Well nourished, well developed, in no acute distress HEENT: normal Lymph: no adenopathy Neck: no JVD Endocrine:  No thryomegaly Vascular: No carotid bruits; FA pulses 2+ bilaterally without bruits  Cardiac:  normal S1, S2; RRR; no murmur  Lungs:  clear to auscultation bilaterally, no wheezing, rhonchi or rales  Abd: soft, nontender, no hepatomegaly  Ext: no edema Musculoskeletal:  No deformities, BUE and BLE strength normal and equal Skin: warm and dry  Neuro: No obvious focal abnormality.  Moves all extremities.  He keeps his eyes closed.  With verbal prodding, he will answer questions with few  words. Psych: Depressed affect   EKG:  The ECG that was done on 02/24/2020 at 12:46 PM and demonstrated 1/2 to 1 mm ST elevation 2, 3, aVF, with reciprocal T wave inversion in lead aVL.  Was personally reviewed.  Relevant CV Studies: No new data  Laboratory Data:  High Sensitivity Troponin:   Recent Labs  Lab 02/24/20 1253  TROPONINIHS 5      Chemistry Recent Labs  Lab 02/24/20 1253  NA 138  K 4.1  CL 102  CO2 25  GLUCOSE 152*  BUN 17  CREATININE 1.12  CALCIUM 9.4  GFRNONAA >60  ANIONGAP 11    Recent Labs  Lab 02/24/20 1253  PROT 7.0  ALBUMIN 3.8  AST 17  ALT 21  ALKPHOS 78  BILITOT 0.5   Hematology Recent Labs  Lab 02/24/20 1253  WBC 16.7*  RBC 5.76  HGB 17.2*  HCT 52.2*  MCV 90.6  MCH 29.9  MCHC 33.0  RDW 13.1  PLT 383   BNPNo results for input(s): BNP, PROBNP in the last 168 hours.  DDimer No results for input(s): DDIMER in the last 168 hours.   Radiology/Studies:  DG Chest Portable 1 View  Result Date: 02/24/2020 CLINICAL DATA:  Shortness of breath, chest pain. EXAM: PORTABLE CHEST 1 VIEW COMPARISON:  April 22, 2017. FINDINGS: The heart size and mediastinal contours are within normal limits. No pneumothorax or pleural effusion is noted. Emphysematous disease is noted in the right upper lobe. No acute pulmonary disease is noted. The visualized skeletal structures are unremarkable. IMPRESSION: No acute cardiopulmonary abnormality seen. Emphysema (ICD10-J43.9). Electronically Signed   By: Marijo Conception M.D.   On: 02/24/2020 13:12     Assessment and Plan:   1. Acute inferior ST elevation myocardial infarction in this patient with prior inferior STEMI and mid right coronary stenting 2015: The patient is having ongoing pain.  EKG performed by EMS was diagnostic for inferior STEMI.  EKG in the emergency room is less diagnostic but the patient has ongoing  symptoms.  I have advised the patient undergo emergency angiography and if indicated PCI.   The procedure and risks were discussed with the patient.  He does not remember much about his prior catheterization because he was postarrest and intubated.  He is willing to proceed. 2. Hyperlipidemia 3. COPD 4. Hypertension, primary.  Blood gait required :[id number]} TIMI Risk Score for ST  Elevation MI:   The patient's TIMI risk score is 1, which indicates a 1.6% risk of all cause mortality at 30 days.       Severity of Illness: The appropriate patient status for this patient is INPATIENT. Inpatient status is judged to be reasonable and necessary in order to provide the required intensity of service to ensure the patient's safety. The patient's presenting symptoms, physical exam findings, and initial radiographic and laboratory data in the context of their chronic comorbidities is felt to place them at high risk for further clinical deterioration. Furthermore, it is not anticipated that the patient will be medically stable for discharge from the hospital within 2 midnights of admission. The following factors support the patient status of inpatient.   " The patient's presenting symptoms include Ongoing chest pain.. " The worrisome physical exam findings include none. " The initial radiographic and laboratory data are worrisome because of STE on ECG. " The chronic co-morbidities include COPD, hyperlipidemia, and primary hypertension..   * I certify that at the point of admission it is my clinical judgment that the patient will require inpatient hospital care spanning beyond 2 midnights from the point of admission due to high intensity of service, high risk for further deterioration and high frequency of surveillance required.*    For questions or updates, please contact [place] Please consult [email or web address] for contact info under   Critical care time 40 minutes   Signed, Sinclair Grooms, MD  02/24/2020 2:21 PM

## 2020-02-24 NOTE — Plan of Care (Signed)
Problem: Education: Goal: Knowledge of General Education information will improve Description: Including pain rating scale, medication(s)/side effects and non-pharmacologic comfort measures Outcome: Progressing   Problem: Health Behavior/Discharge Planning: Goal: Ability to manage health-related needs will improve Outcome: Progressing   Problem: Clinical Measurements: Goal: Ability to maintain clinical measurements within normal limits will improve Outcome: Progressing Goal: Will remain free from infection Outcome: Progressing Goal: Diagnostic test results will improve Outcome: Progressing Goal: Respiratory complications will improve Outcome: Progressing Goal: Cardiovascular complication will be avoided Outcome: Progressing   Problem: Activity: Goal: Risk for activity intolerance will decrease Outcome: Progressing   Problem: Nutrition: Goal: Adequate nutrition will be maintained Outcome: Progressing   Problem: Coping: Goal: Level of anxiety will decrease Outcome: Progressing   Problem: Elimination: Goal: Will not experience complications related to bowel motility Outcome: Progressing Goal: Will not experience complications related to urinary retention Outcome: Completed/Met   Problem: Pain Managment: Goal: General experience of comfort will improve Outcome: Progressing   Problem: Safety: Goal: Ability to remain free from injury will improve Outcome: Progressing   Problem: Skin Integrity: Goal: Risk for impaired skin integrity will decrease Outcome: Progressing   Problem: Education: Goal: Understanding of cardiac disease, CV risk reduction, and recovery process will improve Outcome: Progressing Goal: Understanding of medication regimen will improve Outcome: Progressing   Problem: Activity: Goal: Ability to tolerate increased activity will improve Outcome: Progressing   Problem: Cardiac: Goal: Ability to achieve and maintain adequate cardiopulmonary  perfusion will improve Outcome: Progressing Goal: Vascular access site(s) Level 0-1 will be maintained Outcome: Progressing   Problem: Health Behavior/Discharge Planning: Goal: Ability to safely manage health-related needs after discharge will improve Outcome: Progressing

## 2020-02-24 NOTE — ED Provider Notes (Signed)
[facility] EMERGENCY DEPARTMENT Provider Note   CSN: [id number] Arrival date & time: 02/24/20  1246     History Chief Complaint  Patient presents with  . Code STEMI    Timothy [name] is a 64 y.o. male.  presented to ER with concern for STEMI.  History limited due to acuity.   Patient reports he started experiencing chest pain while walking around the house this morning.  Pain currently 7 out of 10 central chest pressure, had improved with nitro given by EMS.  He took 324 mg aspirin prior to EMS arrival.  EMS report and their EKG concerning for STEMI.   HPI     Past Medical History:  Diagnosis Date  . Altered mental status, improved at discharge 06/19/2013  . Bronchitis, chronic ([place]) 06/19/2013  . Cancer ([place])    bladder  . Cardiac arrest, hypothermic protocol 06/12/2013  . COPD exacerbation ([place]) 06/12/2013  . GERD (gastroesophageal reflux disease)   . Hyperlipidemia LDL goal < 70 06/19/2013  . Hypokalemia, replaced 06/19/2013  . Hypomagnesemia, replaced 06/19/2013  . Hypotension, requiring levophed 06/19/2013  . NSTEMI (non-ST elevated myocardial infarction), DES to RCA 06/13/2013  . Pre-diabetes   . Tobacco abuse 06/19/2013    Patient Active Problem List   Diagnosis Date Noted  . Acute ST elevation myocardial infarction (STEMI) of inferior wall ([place]) 02/24/2020  . Acute lower UTI 04/23/2017  . Sepsis ([place]) 04/22/2017  . Hematuria, gross 04/01/2017  . Immunization due 02/23/2017  . Malignant neoplasm of urinary bladder ([place]) 02/23/2017  . Gross hematuria 11/20/2016  . Lung nodule 11/20/2016  . Gastroesophageal reflux disease without esophagitis 11/20/2016  . Old MI (myocardial infarction) 01/19/2015  . Prediabetes 12/29/2013  . Dental cavities 12/29/2013  . HTN (hypertension) 08/03/2013  . CAD (coronary artery disease) 06/19/2013  . Hyperlipidemia with target LDL less than 70 06/19/2013  . Tobacco abuse 06/19/2013  . NSTEMI (non-ST elevated  myocardial infarction), DES to RCA 06/13/2013  . Cardiac arrest, hypothermic protocol, vfib/vtach.  Resp arrest 06/12/2013  . VT (ventricular tachycardia), requiring 2 shocks 06/12/2013  . COPD GOLD III 06/12/2013    Past Surgical History:  Procedure Laterality Date  . cardiac sstent    . CORONARY ANGIOPLASTY WITH STENT PLACEMENT  06/12/13   [facility] [place] DES to RCA, NSTEMI  . CYSTOSCOPY W/ RETROGRADES Left 03/29/2017   Procedure: CYSTOSCOPY WITH LEFT RETROGRADE PYELOGRAM;  Surgeon: Kathie Rhodes, MD;  Location: [facility] ORS;  Service: Urology;  Laterality: Left;  . LEFT HEART CATHETERIZATION WITH CORONARY ANGIOGRAM N/A 06/12/2013   Procedure: LEFT HEART CATHETERIZATION WITH CORONARY ANGIOGRAM;  Surgeon: Troy Sine, MD;  Location: [facility] CATH LAB;  Service: Cardiovascular;  Laterality: N/A;  . NO PAST SURGERIES    . TRANSURETHRAL RESECTION OF BLADDER TUMOR N/A 03/29/2017   Procedure: TRANSURETHRAL RESECTION OF BLADDER TUMOR (TURBT)/;  Surgeon: Kathie Rhodes, MD;  Location: [facility] ORS;  Service: Urology;  Laterality: N/A;  . transurethral ressection of bladder tumor     Dr. Karsten Ro 03/29/17       Family History  Problem Relation Age of Onset  . Hypertension Other     Social History   Tobacco Use  . Smoking status: Former Smoker    Packs/day: 2.00    Years: 38.00    Pack years: 76.00    Types: Cigarettes    Quit date: 07/10/2008    Years since quitting: 11.6  . Smokeless tobacco: Never Used  Vaping Use  . Vaping Use: Never  used  Substance Use Topics  . Alcohol use: No  . Drug use: No    Types: Marijuana    Home Medications Prior to Admission medications   Medication Sig Start Date End Date Taking? Authorizing Provider  acetaminophen (TYLENOL) 500 MG tablet Take 1,000 mg every 4 (four) hours as needed by mouth for mild pain.    [provider]  albuterol (PROVENTIL) (2.5 MG/3ML) 0.083% nebulizer solution Take 3 mLs (2.5 mg total) by nebulization every 6 (six) hours as needed  for wheezing or shortness of breath. 11/20/16   Ladell Pier, MD  albuterol (VENTOLIN HFA) 108 (90 Base) MCG/ACT inhaler Inhale 2 puffs into the lungs every 6 (six) hours as needed for wheezing or shortness of breath. 02/23/17   Ladell Pier, MD  aspirin EC 81 MG tablet Take 1 tablet (81 mg total) by mouth daily. 10/21/15   Maren Reamer, MD  atorvastatin (LIPITOR) 40 MG tablet Take 1 tablet (40 mg total) by mouth every evening. 04/26/17   Aline August, MD  bisoprolol (ZEBETA) 5 MG tablet Take 0.5 tablets (2.5 mg total) by mouth at bedtime. 05/27/17   Ladell Pier, MD  budesonide-formoterol (SYMBICORT) 160-4.5 MCG/ACT inhaler Inhale 2 puffs 2 (two) times daily into the lungs.    [provider]  nitroGLYCERIN (NITROSTAT) 0.4 MG SL tablet Place 1 tablet (0.4 mg total) under the tongue every 5 (five) minutes as needed for chest pain. NEED OV. 11/20/16   Ladell Pier, MD  ondansetron (ZOFRAN) 4 MG tablet Take 1 tablet (4 mg total) by mouth every 6 (six) hours as needed for nausea. 04/26/17   Aline August, MD  pantoprazole (PROTONIX) 20 MG tablet Take 1 tablet (20 mg total) by mouth daily. 02/23/17   Ladell Pier, MD    Allergies    Ace inhibitors, Aspirin, Ibuprofen, and Losartan  Review of Systems   Review of Systems  Unable to perform ROS: Acuity of condition  Cardiovascular: Positive for chest pain.    Physical Exam Updated Vital Signs BP 129/72   Pulse 74   Temp 98 F (36.7 C) (Oral)   Resp 10   Ht 5\' 6"  (1.676 m)   Wt 60.4 kg   SpO2 97%   BMI 21.49 kg/m   Physical Exam Vitals and nursing note reviewed.  Constitutional:      Appearance: He is well-developed.     Comments: Somewhat diaphoretic  HENT:     Head: Normocephalic and atraumatic.  Eyes:     Conjunctiva/sclera: Conjunctivae normal.  Cardiovascular:     Rate and Rhythm: Normal rate and regular rhythm.     Heart sounds: No murmur heard.   Pulmonary:     Effort: Pulmonary  effort is normal. No respiratory distress.     Breath sounds: Normal breath sounds.  Abdominal:     Palpations: Abdomen is soft.     Tenderness: There is no abdominal tenderness.  Musculoskeletal:        General: No deformity or signs of injury.     Cervical back: Neck supple.  Skin:    General: Skin is warm.     Capillary Refill: Capillary refill takes less than 2 seconds.     Comments: Somewhat diaphoretic  Neurological:     General: No focal deficit present.  Psychiatric:        Mood and Affect: Mood normal.        Behavior: Behavior normal.     ED Results /  Procedures / Treatments   Labs (all labs ordered are listed, but only abnormal results are displayed) Labs Reviewed  CBC WITH DIFFERENTIAL/PLATELET - Abnormal; Notable for the following components:      Result Value   WBC 16.7 (*)    Hemoglobin 17.2 (*)    HCT 52.2 (*)    Neutro Abs 10.3 (*)    Lymphs Abs 4.1 (*)    Monocytes Absolute 1.6 (*)    Abs Immature Granulocytes 0.23 (*)    All other components within normal limits  COMPREHENSIVE METABOLIC PANEL - Abnormal; Notable for the following components:   Glucose, Bld 152 (*)    All other components within normal limits  LIPID PANEL - Abnormal; Notable for the following components:   Cholesterol 268 (*)    Triglycerides 212 (*)    VLDL 42 (*)    All other components within normal limits  RESPIRATORY PANEL BY RT PCR (FLU A&B, COVID)  MRSA PCR SCREENING  PROTIME-INR  APTT  HEMOGLOBIN A1C  CBC  CREATININE, SERUM  TROPONIN I (HIGH SENSITIVITY)  TROPONIN I (HIGH SENSITIVITY)  TROPONIN I (HIGH SENSITIVITY)    EKG EKG Interpretation  Date/Time:  Saturday February 24 2020 12:46:11 EDT Ventricular Rate:  81 PR Interval:    QRS Duration: 83 QT Interval:  378 QTC Calculation: 439 R Axis:   80 Text Interpretation: Sinus arrhythmia Repol abnrm suggests ischemia, anterolateral ST elevation, consider inferior injury Confirmed by Madalyn Rob [phone]) on  02/24/2020 12:50:36 PM   Radiology CARDIAC CATHETERIZATION  Result Date: 02/24/2020  Inferior ST elevation MI related to occlusion of overlapping stents in the mid RCA.  Total mid RCA occlusion was reduced to 0% with TIMI grade III flow following dilatation and placement of an overlapping 34 x 2.75 mm Onyx postdilated to 3.0 mm at high pressure throughout the stented segment.  Normal left main  Normal LAD  Normal circumflex  Normal LV function.  LVEDP is less than 15 mmHg.  No significant regional wall motion abnormality is noted. RECOMMENDATIONS:  Aspirin and Brilinta long-term.  After 1 year, consider down grading antiplatelet therapy to clopidogrel 75 mg/day.  Risk factor modification.  Serial markers.  Eligible for discharge within the next 24 to 36 hours.  DG Chest Portable 1 View  Result Date: 02/24/2020 CLINICAL DATA:  Shortness of breath, chest pain. EXAM: PORTABLE CHEST 1 VIEW COMPARISON:  April 22, 2017. FINDINGS: The heart size and mediastinal contours are within normal limits. No pneumothorax or pleural effusion is noted. Emphysematous disease is noted in the right upper lobe. No acute pulmonary disease is noted. The visualized skeletal structures are unremarkable. IMPRESSION: No acute cardiopulmonary abnormality seen. Emphysema (ICD10-J43.9). Electronically Signed   By: Marijo Conception M.D.   On: 02/24/2020 13:12    Procedures .Critical Care Performed by: Lucrezia Starch, MD Authorized by: Lucrezia Starch, MD   Critical care provider statement:    Critical care time (minutes):  32   Critical care was necessary to treat or prevent imminent or life-threatening deterioration of the following conditions:  Cardiac failure   Critical care was time spent personally by me on the following activities:  Discussions with consultants, evaluation of patient's response to treatment, examination of patient, ordering and performing treatments and interventions, ordering and  review of laboratory studies, ordering and review of radiographic studies, pulse oximetry, re-evaluation of patient's condition, obtaining history from patient or surrogate and review of old charts   (including critical care time)  Medications Ordered in ED Medications  0.9 %  sodium chloride infusion ( Intravenous New Bag/Given 02/24/20 1600)  albuterol (PROVENTIL) (2.5 MG/3ML) 0.083% nebulizer solution 2.5 mg (has no administration in time range)  bisoprolol (ZEBETA) tablet 2.5 mg (has no administration in time range)  mometasone-formoterol (DULERA) 200-5 MCG/ACT inhaler 2 puff (has no administration in time range)  pantoprazole (PROTONIX) EC tablet 20 mg (has no administration in time range)  labetalol (NORMODYNE) injection 10 mg (has no administration in time range)  hydrALAZINE (APRESOLINE) injection 10 mg (has no administration in time range)  acetaminophen (TYLENOL) tablet 650 mg (has no administration in time range)  ondansetron (ZOFRAN) injection 4 mg (has no administration in time range)  heparin injection 5,000 Units (has no administration in time range)  0.9% sodium chloride infusion (1.5 mL/kg/hr  60.4 kg Intravenous New Bag/Given 02/24/20 1602)  sodium chloride flush (NS) 0.9 % injection 3 mL (has no administration in time range)  sodium chloride flush (NS) 0.9 % injection 3 mL (has no administration in time range)  0.9 %  sodium chloride infusion (has no administration in time range)  oxyCODONE (Oxy IR/ROXICODONE) immediate release tablet 5-10 mg (has no administration in time range)  atorvastatin (LIPITOR) tablet 80 mg (has no administration in time range)  nitroGLYCERIN 50 mg in dextrose 5 % 250 mL (0.2 mg/mL) infusion (10 mcg/min Intravenous IV [employer] 02/24/20 1559)  ticagrelor (BRILINTA) tablet 90 mg (has no administration in time range)  aspirin chewable tablet 81 mg (has no administration in time range)  Chlorhexidine Gluconate Cloth 2 % PADS 6 each (has no  administration in time range)  heparin injection 3,650 Units (3,650 Units Intravenous Given 02/24/20 1303)  fentaNYL (SUBLIMAZE) 100 MCG/2ML injection (50 mcg  Given 02/24/20 1256)  nitroGLYCERIN (NITROSTAT) 0.4 MG SL tablet (0.4 mg  Given 02/24/20 1258)  sodium chloride 0.9 % bolus 1,000 mL (0 mLs Intravenous Stopped 02/24/20 1541)  nitroGLYCERIN 50 mg in dextrose 5 % 250 mL (0.2 mg/mL) infusion (0 mcg/min  Stopped 02/24/20 1541)    ED Course  I have reviewed the triage vital signs and the nursing notes.  Pertinent labs & imaging results that were available during my care of the patient were reviewed by me and considered in my medical decision making (see chart for details).  Clinical Course as of Feb 23 1606  Sat Feb 24, 2020  [place] will be down shortly, reports his team is finishing up a case   [RD]  [place] at bedside, going to cath lab with pt   [RD]    Clinical Course User Index [RD] Lucrezia Starch, MD   MDM Rules/Calculators/A&P                         64 year old male history of CAD presenting to ER with concern for chest pain and STEMI per EMS.  STEMI alert called prior to arrival.  On arrival in ER, vital signs stable, patient having 7 out of 10 chest pain.  EMS EKG showing clear ST elevations in 3 aVF with some reciprocal depressions.  Repeat EKG here the ST elevations are more subtle.  Nevertheless given his ongoing pain and some persistent EKG changes, his presentation is consistent with acute myocardial infarction.  Dr. Tamala Julian came to bedside and took patient emergently to Cath Lab.  Received aspirin prehospital, provided heparin in ER.  Final Clinical Impression(s) / ED Diagnoses Final diagnoses:  ST elevation myocardial infarction (STEMI),  unspecified artery ([place])  Acute coronary syndrome [facility])    Rx / DC Orders ED Discharge Orders    None       Lucrezia Starch, MD 02/24/20 [phone]

## 2020-02-25 ENCOUNTER — Other Ambulatory Visit: Payer: Self-pay | Admitting: Student

## 2020-02-25 ENCOUNTER — Encounter (HOSPITAL_COMMUNITY): Payer: Self-pay | Admitting: Interventional Cardiology

## 2020-02-25 DIAGNOSIS — I2510 Atherosclerotic heart disease of native coronary artery without angina pectoris: Secondary | ICD-10-CM

## 2020-02-25 DIAGNOSIS — E785 Hyperlipidemia, unspecified: Secondary | ICD-10-CM | POA: Diagnosis not present

## 2020-02-25 DIAGNOSIS — I10 Essential (primary) hypertension: Secondary | ICD-10-CM | POA: Diagnosis not present

## 2020-02-25 DIAGNOSIS — I2119 ST elevation (STEMI) myocardial infarction involving other coronary artery of inferior wall: Secondary | ICD-10-CM | POA: Diagnosis not present

## 2020-02-25 LAB — BASIC METABOLIC PANEL
Anion gap: 11 (ref 5–15)
BUN: 12 mg/dL (ref 8–23)
CO2: 21 mmol/L — ABNORMAL LOW (ref 22–32)
Calcium: 9.2 mg/dL (ref 8.9–10.3)
Chloride: 105 mmol/L (ref 98–111)
Creatinine, Ser: 1.01 mg/dL (ref 0.61–1.24)
GFR, Estimated: >60 mL/min (ref >60)
Glucose, Bld: 119 mg/dL — ABNORMAL HIGH (ref 70–99)
Potassium: 4.2 mmol/L (ref 3.5–5.1)
Sodium: 137 mmol/L (ref 135–145)

## 2020-02-25 LAB — POCT I-STAT, CHEM 8
BUN: 19 mg/dL (ref 8–23)
Calcium, Ion: 1.23 mmol/L (ref 1.15–1.40)
Chloride: 105 mmol/L (ref 98–111)
Creatinine, Ser: 0.8 mg/dL (ref 0.61–1.24)
Glucose, Bld: 173 mg/dL — ABNORMAL HIGH (ref 70–99)
HCT: 47 % (ref 39.0–52.0)
Hemoglobin: 16 g/dL (ref 13.0–17.0)
Potassium: 3.9 mmol/L (ref 3.5–5.1)
Sodium: 139 mmol/L (ref 135–145)
TCO2: 20 mmol/L — ABNORMAL LOW (ref 22–32)

## 2020-02-25 LAB — CBC
HCT: 49.8 % (ref 39.0–52.0)
Hemoglobin: 16.6 g/dL (ref 13.0–17.0)
MCH: 29.9 pg (ref 26.0–34.0)
MCHC: 33.3 g/dL (ref 30.0–36.0)
MCV: 89.6 fL (ref 80.0–100.0)
Platelets: 375 10*3/uL (ref 150–400)
RBC: 5.56 MIL/uL (ref 4.22–5.81)
RDW: 13.2 % (ref 11.5–15.5)
WBC: 19.5 10*3/uL — ABNORMAL HIGH (ref 4.0–10.5)
nRBC: 0 % (ref 0.0–0.2)

## 2020-02-25 LAB — POCT ACTIVATED CLOTTING TIME: Activated Clotting Time: 791 seconds

## 2020-02-25 LAB — MAGNESIUM: Magnesium: 2.1 mg/dL (ref 1.7–2.4)

## 2020-02-25 MED ORDER — BISOPROLOL FUMARATE 5 MG PO TABS: 2.5000 mg | ORAL_TABLET | Freq: Every day | ORAL | 1 refills | Status: DC

## 2020-02-25 MED ORDER — PNEUMOCOCCAL VAC POLYVALENT 25 MCG/0.5ML IJ INJ: 0.5000 mL | INJECTION | INTRAMUSCULAR | Status: DC

## 2020-02-25 MED ORDER — BUDESONIDE-FORMOTEROL FUMARATE 160-4.5 MCG/ACT IN AERO: 2.0000 | INHALATION_SPRAY | Freq: Two times a day (BID) | RESPIRATORY_TRACT | 1 refills | Status: DC

## 2020-02-25 MED ORDER — TICAGRELOR 90 MG PO TABS: 90.0000 mg | ORAL_TABLET | Freq: Two times a day (BID) | ORAL | 3 refills | Status: DC

## 2020-02-25 MED ORDER — ALBUTEROL SULFATE HFA 108 (90 BASE) MCG/ACT IN AERS: 2.0000 | INHALATION_SPRAY | Freq: Four times a day (QID) | RESPIRATORY_TRACT | 0 refills | Status: DC | PRN

## 2020-02-25 MED ORDER — ATORVASTATIN CALCIUM 80 MG PO TABS: 80.0000 mg | ORAL_TABLET | Freq: Every evening | ORAL | 1 refills | Status: DC

## 2020-02-25 MED ORDER — INFLUENZA VAC SPLIT QUAD 0.5 ML IM SUSY: 0.5000 mL | PREFILLED_SYRINGE | INTRAMUSCULAR | Status: DC

## 2020-02-25 MED ORDER — NITROGLYCERIN 0.4 MG SL SUBL: 0.4000 mg | SUBLINGUAL_TABLET | SUBLINGUAL | 2 refills | Status: DC | PRN

## 2020-02-25 NOTE — Plan of Care (Addendum)
Reviewed post discharge care, follow-ups, activity guidelines and restrictions, medications, and instructions.  Patient verbalized understanding and stated no questions.  Medications sent into pharmacy electronically.  Script card and e-script given to patient.    Patient off unit in [facility] to home via private vehicle- with driver.     Problem: Education: Goal: Knowledge of General Education information will improve Description: Including pain rating scale, medication(s)/side effects and non-pharmacologic comfort measures 02/25/2020 1434 by Trinna Balloon, RN Outcome: Adequate for Discharge 02/25/2020 1005 by Trinna Balloon, RN Outcome: Progressing   Problem: Health Behavior/Discharge Planning: Goal: Ability to manage health-related needs will improve 02/25/2020 1434 by Trinna Balloon, RN Outcome: Adequate for Discharge 02/25/2020 1005 by Trinna Balloon, RN Outcome: Progressing   Problem: Clinical Measurements: Goal: Ability to maintain clinical measurements within normal limits will improve 02/25/2020 1434 by Trinna Balloon, RN Outcome: Adequate for Discharge 02/25/2020 1005 by Trinna Balloon, RN Outcome: Progressing Goal: Will remain free from infection 02/25/2020 1434 by Trinna Balloon, RN Outcome: Adequate for Discharge 02/25/2020 1005 by Trinna Balloon, RN Outcome: Progressing Goal: Diagnostic test results will improve 02/25/2020 1434 by Trinna Balloon, RN Outcome: Adequate for Discharge 02/25/2020 1005 by Trinna Balloon, RN Outcome: Progressing Goal: Respiratory complications will improve 02/25/2020 1434 by Trinna Balloon, RN Outcome: Adequate for Discharge 02/25/2020 1005 by Trinna Balloon, RN Outcome: Progressing Goal: Cardiovascular complication will be avoided 02/25/2020 1434 by Trinna Balloon, RN Outcome: Adequate for Discharge 02/25/2020 1005 by Trinna Balloon, RN Outcome: Progressing   Problem: Activity: Goal: Risk for activity intolerance will  decrease 02/25/2020 1434 by Trinna Balloon, RN Outcome: Adequate for Discharge 02/25/2020 1005 by Trinna Balloon, RN Outcome: Progressing   Problem: Nutrition: Goal: Adequate nutrition will be maintained 02/25/2020 1434 by Trinna Balloon, RN Outcome: Adequate for Discharge 02/25/2020 1005 by Trinna Balloon, RN Outcome: Progressing   Problem: Coping: Goal: Level of anxiety will decrease 02/25/2020 1434 by Trinna Balloon, RN Outcome: Adequate for Discharge 02/25/2020 1005 by Trinna Balloon, RN Outcome: Progressing   Problem: Elimination: Goal: Will not experience complications related to bowel motility 02/25/2020 1434 by Trinna Balloon, RN Outcome: Adequate for Discharge 02/25/2020 1005 by Trinna Balloon, RN Outcome: Progressing   Problem: Pain Managment: Goal: General experience of comfort will improve 02/25/2020 1434 by Trinna Balloon, RN Outcome: Adequate for Discharge 02/25/2020 1005 by Trinna Balloon, RN Outcome: Progressing   Problem: Safety: Goal: Ability to remain free from injury will improve 02/25/2020 1434 by Trinna Balloon, RN Outcome: Adequate for Discharge 02/25/2020 1005 by Trinna Balloon, RN Outcome: Progressing   Problem: Skin Integrity: Goal: Risk for impaired skin integrity will decrease 02/25/2020 1434 by Trinna Balloon, RN Outcome: Adequate for Discharge 02/25/2020 1005 by Trinna Balloon, RN Outcome: Progressing   Problem: Education: Goal: Understanding of cardiac disease, CV risk reduction, and recovery process will improve 02/25/2020 1434 by Trinna Balloon, RN Outcome: Adequate for Discharge 02/25/2020 1005 by Trinna Balloon, RN Outcome: Progressing Goal: Understanding of medication regimen will improve 02/25/2020 1434 by Trinna Balloon, RN Outcome: Adequate for Discharge 02/25/2020 1005 by Trinna Balloon, RN Outcome: Progressing Goal: Individualized Educational Video(s) 02/25/2020 1434 by Trinna Balloon, RN Outcome: Adequate for  Discharge 02/25/2020 1005 by Trinna Balloon, RN Outcome: Progressing   Problem: Activity: Goal: Ability to tolerate increased activity will improve 02/25/2020 1434 by Trinna Balloon, RN Outcome: Adequate  for Discharge 02/25/2020 1005 by Trinna Balloon, RN Outcome: Progressing   Problem: Cardiac: Goal: Ability to achieve and maintain adequate cardiopulmonary perfusion will improve 02/25/2020 1434 by Trinna Balloon, RN Outcome: Adequate for Discharge 02/25/2020 1005 by Trinna Balloon, RN Outcome: Progressing Goal: Vascular access site(s) Level 0-1 will be maintained 02/25/2020 1434 by Trinna Balloon, RN Outcome: Adequate for Discharge 02/25/2020 1005 by Trinna Balloon, RN Outcome: Progressing   Problem: Health Behavior/Discharge Planning: Goal: Ability to safely manage health-related needs after discharge will improve 02/25/2020 1434 by Trinna Balloon, RN Outcome: Adequate for Discharge 02/25/2020 1005 by Trinna Balloon, RN Outcome: Progressing

## 2020-02-25 NOTE — Plan of Care (Signed)
  Problem: Education: Goal: Knowledge of General Education information will improve Description: Including pain rating scale, medication(s)/side effects and non-pharmacologic comfort measures Outcome: Progressing   Problem: Health Behavior/Discharge Planning: Goal: Ability to manage health-related needs will improve Outcome: Progressing   Problem: Clinical Measurements: Goal: Ability to maintain clinical measurements within normal limits will improve Outcome: Progressing Goal: Will remain free from infection Outcome: Progressing Goal: Diagnostic test results will improve Outcome: Progressing Goal: Respiratory complications will improve Outcome: Progressing Goal: Cardiovascular complication will be avoided Outcome: Progressing   Problem: Activity: Goal: Risk for activity intolerance will decrease Outcome: Progressing   Problem: Nutrition: Goal: Adequate nutrition will be maintained Outcome: Progressing   Problem: Coping: Goal: Level of anxiety will decrease Outcome: Progressing   Problem: Elimination: Goal: Will not experience complications related to bowel motility Outcome: Progressing   Problem: Pain Managment: Goal: General experience of comfort will improve Outcome: Progressing   Problem: Safety: Goal: Ability to remain free from injury will improve Outcome: Progressing   Problem: Skin Integrity: Goal: Risk for impaired skin integrity will decrease Outcome: Progressing   Problem: Education: Goal: Understanding of cardiac disease, CV risk reduction, and recovery process will improve Outcome: Progressing Goal: Understanding of medication regimen will improve Outcome: Progressing Goal: Individualized Educational Video(s) Outcome: Progressing   Problem: Activity: Goal: Ability to tolerate increased activity will improve Outcome: Progressing   Problem: Cardiac: Goal: Ability to achieve and maintain adequate cardiopulmonary perfusion will improve Outcome:  Progressing Goal: Vascular access site(s) Level 0-1 will be maintained Outcome: Progressing   Problem: Health Behavior/Discharge Planning: Goal: Ability to safely manage health-related needs after discharge will improve Outcome: Progressing

## 2020-02-25 NOTE — Care Management (Signed)
1129 02-25-20 Case Manager spoke with patient regarding Brilinta. Unable to do a benefits check today-pharmacy will make the patient aware of cost. Patient has the 30 day free card for Brilinta. Case Manager did make the patient aware that if the cost is too expensive to make cardiology aware and they may have to use an alternative medication. Bethena Roys, RN,BSN Case Manager

## 2020-02-25 NOTE — Discharge Summary (Signed)
Discharge Summary    Patient ID: Timothy [name] MRN: [id number]; DOB: 02/03/1956  Admit date: 02/24/2020 Discharge date: 02/25/2020  Primary Care Provider: Ladell Pier, MD  Primary Cardiologist: Shelva Majestic, MD   Discharge Diagnoses    Principal Problem:   Acute ST elevation myocardial infarction (STEMI) of inferior wall [facility]) Active Problems:   CAD (coronary artery disease)   Hyperlipidemia with target LDL less than 70   Tobacco abuse   HTN (hypertension)   Old MI (myocardial infarction)   Diagnostic Studies/Procedures    Cardiac Catheterization: 02/24/2020  Inferior ST elevation MI related to occlusion of overlapping stents in the mid RCA.  Total mid RCA occlusion was reduced to 0% with TIMI grade III flow following dilatation and placement of an overlapping 34 x 2.75 mm Onyx postdilated to 3.0 mm at high pressure throughout the stented segment.  Normal left main  Normal LAD  Normal circumflex  Normal LV function.  LVEDP is less than 15 mmHg.  No significant regional wall motion abnormality is noted.  RECOMMENDATIONS:   Aspirin and Brilinta long-term.  After 1 year, consider down grading antiplatelet therapy to clopidogrel 75 mg/day.  Risk factor modification.  Serial markers.  Eligible for discharge within the next 24 to 36 hours.   Echocardiogram: 02/24/2020 IMPRESSIONS    1. Left ventricular ejection fraction, by estimation, is 55 to 60%. The  left ventricle has normal function. The left ventricle has no regional  wall motion abnormalities. Left ventricular diastolic parameters were  normal.  2. Right ventricular systolic function is normal. The right ventricular  size is normal. There is normal pulmonary artery systolic pressure.  3. The mitral valve is normal in structure. Trivial mitral valve  regurgitation. No evidence of mitral stenosis.  4. The aortic valve is tricuspid. There is mild calcification of the  aortic valve. Aortic  valve regurgitation is not visualized. Mild aortic  valve sclerosis is present, with no evidence of aortic valve stenosis.  5. The inferior vena cava is normal in size with greater than 50%  respiratory variability, suggesting right atrial pressure of 3 mmHg.   Comparison(s): No significant change from prior study.     History of Present Illness     Timothy [name] is a 64 y.o. male with past medical history of CAD (s/p cardiac arrest in 06/2013 with DES to RCA), COPD, HLD and tobacco use who presented to [facility] on 02/24/2020 as a CODE STEMI.   He reported developing chest discomfort earlier that morning and denied any associated symptoms. His initial EKG by EMS had demonstrated ST elevation along the inferior leads, therefore he was taken for an emergent cardiac catheterization.   Hospital Course     Consultants: None  His catheterization was performed by Dr. Tamala Julian and showed a total mid RCA occlusion which was reduced to 0% following dilatation and placement of an overlapping 34 x 2.75 mm Onyx DES. LM, LCx and LAD were normal. Was recommended to continue ASA and Brilinta with consideration of switching to Plavix 75 mg daily once 1 year out from stent placement. Echocardiogram was performed later in the day and showed a preserved EF of 55-60% with no regional WMA. He did have mild aortic valve sclerosis without stenosis and trivial MR.   He was evaluated by Dr. Rayann Heman on 02/25/2020 and denied any recurrent chest pain or dyspnea. HS Troponin values had continued to trend upwards to 12588. He was felt to be stable for discharge given no  recurrent symptoms. A follow-up visit has been arranged at the [facility] office. He was discharged home in stable condition.    Did the patient have an acute coronary syndrome (MI, NSTEMI, STEMI, etc) this admission?:  Yes                               AHA/ACC Clinical Performance & Quality Measures: 5. Aspirin prescribed? - Yes 6. ADP Receptor Inhibitor  (Plavix/Clopidogrel, Brilinta/Ticagrelor or Effient/Prasugrel) prescribed (includes medically managed patients)? - Yes 7. Beta Blocker prescribed? - Yes 8. High Intensity Statin (Lipitor 40-80mg  or Crestor 20-40mg ) prescribed? - Yes 9. EF assessed during THIS hospitalization? - Yes 10. For EF <40%, was ACEI/ARB prescribed? - Not Applicable (EF >/= 46%) 11. For EF <40%, Aldosterone Antagonist (Spironolactone or Eplerenone) prescribed? - Not Applicable (EF >/= 56%) 12. Cardiac Rehab Phase II ordered (including medically managed patients)? - Yes - Order placed prior to discharge as cardiac rehab not covering in the hospital on 02/25/2020.   _____________  Discharge Vitals Blood pressure 134/82, pulse 86, temperature 98.5 F (36.9 C), temperature source Oral, resp. rate 13, height 5\' 6"  (1.676 m), weight 60.4 kg, SpO2 94 %.  Filed Weights   02/24/20 1317  Weight: 60.4 kg    Labs & Radiologic Studies    CBC Recent Labs    02/24/20 1253 02/24/20 1253 02/24/20 1556 02/25/20 0659  WBC 16.7*   < > 19.5* 19.5*  NEUTROABS 10.3*  --   --   --   HGB 17.2*   < > 15.9 16.6  HCT 52.2*   < > 47.3 49.8  MCV 90.6   < > 89.8 89.6  PLT 383   < > 344 375   < > = values in this interval not displayed.   Basic Metabolic Panel Recent Labs    02/24/20 1253 02/24/20 1253 02/24/20 1556 02/25/20 0659  NA 138  --   --  137  K 4.1  --   --  4.2  CL 102  --   --  105  CO2 25  --   --  21*  GLUCOSE 152*  --   --  119*  BUN 17  --   --  12  CREATININE 1.12   < > 1.05 1.01  CALCIUM 9.4  --   --  9.2  MG  --   --   --  2.1   < > = values in this interval not displayed.   Liver Function Tests Recent Labs    02/24/20 1253  AST 17  ALT 21  ALKPHOS 78  BILITOT 0.5  PROT 7.0  ALBUMIN 3.8   No results for input(s): LIPASE, AMYLASE in the last 72 hours. High Sensitivity Troponin:   Recent Labs  Lab 02/24/20 1253 02/24/20 1556 02/24/20 1915 02/24/20 2144  TROPONINIHS 5 2,628* 8,547*  12,588*    BNP Invalid input(s): POCBNP D-Dimer No results for input(s): DDIMER in the last 72 hours. Hemoglobin A1C No results for input(s): HGBA1C in the last 72 hours. Fasting Lipid Panel Recent Labs    02/24/20 1253  CHOL 268*  HDL 48  LDLCALC NOT CALCULATED  TRIG 212*  CHOLHDL 5.6   Thyroid Function Tests No results for input(s): TSH, T4TOTAL, T3FREE, THYROIDAB in the last 72 hours.  Invalid input(s): FREET3 _____________   Disposition   Pt is being discharged home today in good condition.  Follow-up Plans & Appointments  Follow-up Information    Erlene Quan, PA-C Follow up on 03/07/2020.   Specialties: Cardiology, Radiology Why: Cardiology Hospital Follow-up on 03/07/2020 at 1:45 PM.  Contact information: [address] [place] [address] [phone]              Discharge Instructions    Diet - low sodium heart healthy   Complete by: As directed    Discharge instructions   Complete by: As directed    [place].  PLEASE ATTEND ALL SCHEDULED FOLLOW-UP APPOINTMENTS.   Activity: Increase activity slowly as tolerated. You may shower, but no soaking baths (or swimming) for 1 week. You may not return to work until cleared by your cardiologist. No lifting over 10 lbs for 4 weeks. No sexual activity for 4 weeks.   Wound Care: You may wash cath site gently with soap and water. Keep cath site clean and dry. If you notice pain, swelling, bleeding or pus at your cath site, please call [phone].   Increase activity slowly   Complete by: As directed       Discharge Medications   Allergies as of 02/25/2020      Reactions   Ace Inhibitors Other (See Comments)   Caused wheezing   Aspirin Swelling, Other (See Comments)   Lips swell- still takes approx 2 times a week in 2021   Ibuprofen Swelling, Other (See Comments)   Lips swell   Losartan Other (See  Comments)   Leg cramps      Medication List    STOP taking these medications   ondansetron 4 MG tablet Commonly known as: ZOFRAN   pantoprazole 20 MG tablet Commonly known as: Protonix     TAKE these medications   acetaminophen 500 MG tablet Commonly known as: TYLENOL Take 500-1,000 mg by mouth every 8 (eight) hours as needed for mild pain or headache.   albuterol 108 (90 Base) MCG/ACT inhaler Commonly known as: VENTOLIN HFA Inhale 2 puffs into the lungs every 6 (six) hours as needed for wheezing or shortness of breath. What changed: Another medication with the same name was removed. Continue taking this medication, and follow the directions you see here.   aspirin EC 81 MG tablet Take 1 tablet (81 mg total) by mouth daily. What changed: when to take this   atorvastatin 80 MG tablet Commonly known as: LIPITOR Take 1 tablet (80 mg total) by mouth every evening. What changed:   medication strength  how much to take   bisoprolol 5 MG tablet Commonly known as: ZEBETA Take 0.5 tablets (2.5 mg total) by mouth at bedtime.   budesonide-formoterol 160-4.5 MCG/ACT inhaler Commonly known as: SYMBICORT Inhale 2 puffs 2 (two) times daily into the lungs.   nitroGLYCERIN 0.4 MG SL tablet Commonly known as: Nitrostat Place 1 tablet (0.4 mg total) under the tongue every 5 (five) minutes as needed for chest pain. NEED OV.   ticagrelor 90 MG Tabs tablet Commonly known as: BRILINTA Take 1 tablet (90 mg total) by mouth 2 (two) times daily.          Outstanding Labs/Studies   Repeat FLP and LFT's in 6-8 weeks (not compliant with statin therapy prior to this admission and restarted).   Duration of Discharge Encounter   Greater than 30 minutes including physician time.  Signed, Erma Heritage, PA-C 02/25/2020, 11:20 AM

## 2020-02-25 NOTE — Progress Notes (Signed)
Progress Note   Subjective   Doing well today, the patient denies CP or SOB.  No new concerns.  He wants to go home  Inpatient Medications    Scheduled Meds: . aspirin  81 mg Oral Daily  . atorvastatin  80 mg Oral q1800  . bisoprolol  2.5 mg Oral QHS  . Chlorhexidine Gluconate Cloth  6 each Topical Daily  . heparin  5,000 Units Subcutaneous Q8H  . [START ON 02/26/2020] influenza vac split quadrivalent PF  0.5 mL Intramuscular Tomorrow-1000  . mometasone-formoterol  2 puff Inhalation BID  . pantoprazole  20 mg Oral Daily  . [START ON 02/26/2020] pneumococcal 23 valent vaccine  0.5 mL Intramuscular Tomorrow-1000  . sodium chloride flush  3 mL Intravenous Q12H  . ticagrelor  90 mg Oral BID   Continuous Infusions: . sodium chloride    . nitroGLYCERIN Stopped (02/24/20 1938)   PRN Meds: sodium chloride, acetaminophen, albuterol, ondansetron (ZOFRAN) IV, oxyCODONE, sodium chloride flush   Vital Signs    Vitals:   02/25/20 0700 02/25/20 0753 02/25/20 0800 02/25/20 0803  BP:   (!) 149/79   Pulse: 81 91 85   Resp: 15 14 (!) 21   Temp:    98.5 F (36.9 C)  TempSrc:    Oral  SpO2: 93% 96% 95%   Weight:      Height:        Intake/Output Summary (Last 24 hours) at 02/25/2020 0350 Last data filed at 02/25/2020 0800 Gross per 24 hour  Intake 1418.99 ml  Output 1825 ml  Net -406.01 ml   Filed Weights   02/24/20 1317  Weight: 60.4 kg    Telemetry    Sinus, nsvt (short) yesterday is resolved - Personally Reviewed  Physical Exam   GEN- The patient is well appearing, alert and oriented x 3 today.   Head- normocephalic, atraumatic Eyes-  Sclera clear, conjunctiva pink Ears- hearing intact Oropharynx- clear Neck- supple, Lungs-  normal work of breathing Heart- Regular rate and rhythm  GI- soft  Extremities- no clubbing, cyanosis, or edema  MS- no significant deformity or atrophy Skin- no rash or lesion Psych- euthymic mood, full affect Neuro- strength and  sensation are intact   Labs    Chemistry Recent Labs  Lab 02/24/20 1253 02/24/20 1556 02/25/20 0659  NA 138  --  137  K 4.1  --  4.2  CL 102  --  105  CO2 25  --  21*  GLUCOSE 152*  --  119*  BUN 17  --  12  CREATININE 1.12 1.05 1.01  CALCIUM 9.4  --  9.2  PROT 7.0  --   --   ALBUMIN 3.8  --   --   AST 17  --   --   ALT 21  --   --   ALKPHOS 78  --   --   BILITOT 0.5  --   --   GFRNONAA >60 >60 >60  ANIONGAP 11  --  11     Hematology Recent Labs  Lab 02/24/20 1253 02/24/20 1556 02/25/20 0659  WBC 16.7* 19.5* 19.5*  RBC 5.76 5.27 5.56  HGB 17.2* 15.9 16.6  HCT 52.2* 47.3 49.8  MCV 90.6 89.8 89.6  MCH 29.9 30.2 29.9  MCHC 33.0 33.6 33.3  RDW 13.1 13.0 13.2  PLT 383 344 375     Patient ID  Timothy [name] is a 64 y.o. male with a history of cardiac arrest February  2015, successful resuscitation, hypothermia protocol, emergency angioplasty and stenting of the right coronary with overlapping 2.5 x 18 drug-eluting stents (Dr. Ellouise Newer), tobacco abuse, COPD, possible continued cognitive impairment, and history of allergies to aspirin and ACE inhibitor therapy  He is admitted with inferior STEMI.   Assessment & Plan    1.  Acute inferior STEMI S/p PCI of RCA Continue ASA and Brilinta long term (possibly switching to plavix after a year) Risks factor modification is advised Echo is reviewed  2. HTN Stable No change required today  3. HL Continue high dose statin  4. COPD Stable No change required today  Cardiac rehab to see He wants to go home later today He will need close outpatient follow-up  Thompson Grayer MD, [facility] 02/25/2020 8:22 AM

## 2020-02-26 ENCOUNTER — Telehealth: Payer: Self-pay

## 2020-02-26 ENCOUNTER — Encounter (HOSPITAL_COMMUNITY): Payer: Self-pay | Admitting: Interventional Cardiology

## 2020-02-26 ENCOUNTER — Telehealth (HOSPITAL_COMMUNITY): Payer: Self-pay | Admitting: *Deleted

## 2020-02-26 DIAGNOSIS — Z955 Presence of coronary angioplasty implant and graft: Secondary | ICD-10-CM

## 2020-02-26 DIAGNOSIS — I2111 ST elevation (STEMI) myocardial infarction involving right coronary artery: Secondary | ICD-10-CM

## 2020-02-26 LAB — HEMOGLOBIN A1C
Hgb A1c MFr Bld: 6.2 % — ABNORMAL HIGH (ref 4.8–5.6)
Mean Plasma Glucose: 131 mg/dL

## 2020-02-26 LAB — POCT ACTIVATED CLOTTING TIME: Activated Clotting Time: 389 seconds

## 2020-02-26 MED FILL — Nitroglycerin IV Soln 100 MCG/ML in D5W: INTRA_ARTERIAL | Qty: 10 | Status: AC

## 2020-02-26 NOTE — Telephone Encounter (Signed)
TOC call attempted. Left message to call back.

## 2020-02-26 NOTE — Telephone Encounter (Signed)
-----   Message from Erma Heritage, [place] sent at 02/25/2020 10:29 AM EDT ----- Regarding: TOC Call This patient is being discharged from [facility] on 02/25/2020 and has a follow-up with Kerin Ransom on 03/07/2020 for a TOC visit from his STEMI. He will need a TOC call following discharge.   Thanks,  [place]

## 2020-02-26 NOTE — Telephone Encounter (Signed)
Transition Care Management Unsuccessful Follow-up Telephone Call  Date of discharge and from where: 02/25/2020, [facility]   Attempts:  Call placed to # [phone], message left with call back requested to this CM   Patient needs to schedule follow up with PCP

## 2020-02-27 ENCOUNTER — Telehealth: Payer: Self-pay

## 2020-02-27 NOTE — Telephone Encounter (Signed)
Transition Care Management Unsuccessful Follow-up Telephone Call  Date of discharge and from where:   02/25/2020, [facility]  Attempts:  2nd Attempt  Reason for unsuccessful TCM follow-up call:  Left voice message - call placed to # [phone].  Need to discuss scheduling a follow up appointment with PCP. Letter sent to patient requesting he contact this office to schedule the appointment as we have not been able to reach him.

## 2020-02-27 NOTE — Telephone Encounter (Signed)
Attempted TOC call. LMTCB.

## 2020-02-28 ENCOUNTER — Telehealth: Payer: Self-pay

## 2020-02-28 NOTE — Telephone Encounter (Signed)
Transition Care Management Follow-up Telephone Call  Date of discharge and from where: 02/25/2020, [facility]   How have you been since you were released from the hospital? He said he is feeling " old and tired."   Any questions or concerns? No, none at this time  Items Reviewed:  Did the pt receive and understand the discharge instructions provided? Yes   Medications obtained and verified? Yes  - he said he has all medications including the brilinta and he did not have any questions about the med regime.   Other? No   Any new allergies since your discharge? No   do you have support at home? Yes   Home Care and Equipment/Supplies Were home health services ordered? No.  Were any new equipment or medical supplies ordered?  No What is the name of the medical supply agency?n/a Were you able to get the supplies/equipment?n/a Do you have any questions related to the use of the equipment or supplies? N/A  Functional Questionnaire: (I = Independent and D = Dependent) ADLs:independent  Follow up appointments reviewed:   PCP Hospital f/u appt confirmed? Yes  - Dr Wynetta Emery 03/05/2020.  [facility] f/u appt confirmed? Yes  cardiology - 03/07/2020  Are transportation arrangements needed? No   If their condition worsens, is the pt aware to call PCP or go to the Emergency Dept.? yes  Was the patient provided with contact information for the PCP's office or ED? He said he probably has the clinic phone number.  He did not want this CM to give it to him.   Was to pt encouraged to call back with questions or concerns? yes

## 2020-02-28 NOTE — Telephone Encounter (Signed)
Patient contacted regarding discharge from [facility] on 1024/21.  Patient understands to follow up with provider [place] on 03/07/20 at 1:45 at [facility]. Patient understands discharge instructions? yes  Patient understands medications and regiment? yes  Patient understands to bring all medications to this visit? yes

## 2020-02-29 ENCOUNTER — Telehealth (HOSPITAL_COMMUNITY): Payer: Self-pay

## 2020-02-29 NOTE — Telephone Encounter (Signed)
Pt insurance is active and benefits verified through [place] $30, DED 0/0 met, out of pocket $3,900/0 met, co-insurance 0%. no pre-authorization required. Passport, RonP/BCBS, REF# [id number]  Will contact patient to see if he is interested in the Cardiac Rehab Program. If interested, patient will need to complete follow up appt. Once completed, patient will be contacted for scheduling upon review by the RN Navigator.

## 2020-02-29 NOTE — Telephone Encounter (Signed)
Called pt to see if he was interested in the cardiac rehab program, pt stated that he is not interested at this time. Advised pt to call back if anything changes. Closed referral.

## 2020-03-05 ENCOUNTER — Ambulatory Visit (HOSPITAL_BASED_OUTPATIENT_CLINIC_OR_DEPARTMENT_OTHER): Payer: Medicare Other | Admitting: Pharmacist

## 2020-03-05 ENCOUNTER — Ambulatory Visit: Payer: Medicare Other | Attending: Internal Medicine | Admitting: Internal Medicine

## 2020-03-05 ENCOUNTER — Encounter: Payer: Self-pay | Admitting: Internal Medicine

## 2020-03-05 ENCOUNTER — Other Ambulatory Visit: Payer: Self-pay

## 2020-03-05 VITALS — BP 134/79 | HR 78 | Resp 16 | Wt 137.6 lb

## 2020-03-05 DIAGNOSIS — Z23 Encounter for immunization: Secondary | ICD-10-CM

## 2020-03-05 DIAGNOSIS — I2119 ST elevation (STEMI) myocardial infarction involving other coronary artery of inferior wall: Secondary | ICD-10-CM | POA: Diagnosis not present

## 2020-03-05 DIAGNOSIS — Z09 Encounter for follow-up examination after completed treatment for conditions other than malignant neoplasm: Secondary | ICD-10-CM | POA: Diagnosis not present

## 2020-03-05 DIAGNOSIS — J449 Chronic obstructive pulmonary disease, unspecified: Secondary | ICD-10-CM

## 2020-03-05 DIAGNOSIS — Z1211 Encounter for screening for malignant neoplasm of colon: Secondary | ICD-10-CM

## 2020-03-05 DIAGNOSIS — E782 Mixed hyperlipidemia: Secondary | ICD-10-CM

## 2020-03-05 DIAGNOSIS — R7303 Prediabetes: Secondary | ICD-10-CM

## 2020-03-05 DIAGNOSIS — C679 Malignant neoplasm of bladder, unspecified: Secondary | ICD-10-CM

## 2020-03-05 MED ORDER — METFORMIN HCL 500 MG PO TABS: 250.0000 mg | ORAL_TABLET | Freq: Every day | ORAL | 3 refills | Status: DC

## 2020-03-05 MED ORDER — BUDESONIDE-FORMOTEROL FUMARATE 160-4.5 MCG/ACT IN AERO: 2.0000 | INHALATION_SPRAY | Freq: Two times a day (BID) | RESPIRATORY_TRACT | 6 refills | Status: DC

## 2020-03-05 MED ORDER — SPIRIVA HANDIHALER 18 MCG IN CAPS: 18.0000 ug | ORAL_CAPSULE | Freq: Every day | RESPIRATORY_TRACT | 12 refills | Status: DC

## 2020-03-05 NOTE — Patient Instructions (Signed)
Continue Symbicort.  We have added an inhaler called Spiriva.  I have referred you to Dr. Melvyn Novas. Start Metformin 250 mg daily to help prevent progression to diabetes. You will receive a stool kit in the mail to use and mail back for colon cancer screening.     Prediabetes Eating Plan Prediabetes is a condition that causes blood sugar (glucose) levels to be higher than normal. This increases the risk for developing diabetes. In order to prevent diabetes from developing, your health care provider may recommend a diet and other lifestyle changes to help you:  Control your blood glucose levels.  Improve your cholesterol levels.  Manage your blood pressure. Your health care provider may recommend working with a diet and nutrition specialist (dietitian) to make a meal plan that is best for you. What are tips for following this plan? Lifestyle  Set weight loss goals with the help of your health care team. It is recommended that most people with prediabetes lose 7% of their current body weight.  Exercise for at least 30 minutes at least 5 days a week.  Attend a support group or seek ongoing support from a mental health counselor.  Take over-the-counter and prescription medicines only as told by your health care provider. Reading food labels  Read food labels to check the amount of fat, salt (sodium), and sugar in prepackaged foods. Avoid foods that have: ? Saturated fats. ? Trans fats. ? Added sugars.  Avoid foods that have more than 300 milligrams (mg) of sodium per serving. Limit your daily sodium intake to less than 2,300 mg each day. Shopping  Avoid buying pre-made and processed foods. Cooking  Cook with olive oil. Do not use butter, lard, or ghee.  Bake, broil, grill, or boil foods. Avoid frying. Meal planning   Work with your dietitian to develop an eating plan that is right for you. This may include: ? Tracking how many calories you take in. Use a food diary, notebook, or  mobile application to track what you eat at each meal. ? Using the glycemic index (GI) to plan your meals. The index tells you how quickly a food will raise your blood glucose. Choose low-GI foods. These foods take a longer time to raise blood glucose.  Consider following a Mediterranean diet. This diet includes: ? Several servings each day of fresh fruits and vegetables. ? Eating fish at least twice a week. ? Several servings each day of whole grains, beans, nuts, and seeds. ? Using olive oil instead of other fats. ? Moderate alcohol consumption. ? Eating small amounts of red meat and whole-fat dairy.  If you have high blood pressure, you may need to limit your sodium intake or follow a diet such as the DASH eating plan. DASH is an eating plan that aims to lower high blood pressure. What foods are recommended? The items listed below may not be a complete list. Talk with your dietitian about what dietary choices are best for you. Grains Whole grains, such as whole-wheat or whole-grain breads, crackers, cereals, and pasta. Unsweetened oatmeal. Bulgur. Barley. Quinoa. Brown rice. Corn or whole-wheat flour tortillas or taco shells. Vegetables Lettuce. Spinach. Peas. Beets. Cauliflower. Cabbage. Broccoli. Carrots. Tomatoes. Squash. Eggplant. Herbs. Peppers. Onions. Cucumbers. Brussels sprouts. Fruits Berries. Bananas. Apples. Oranges. Grapes. Papaya. Mango. Pomegranate. Kiwi. Grapefruit. Cherries. Meats and other protein foods Seafood. Poultry without skin. Lean cuts of pork and beef. Tofu. Eggs. Nuts. Beans. Dairy Low-fat or fat-free dairy products, such as yogurt, cottage cheese, and cheese.  Beverages Water. Tea. Coffee. Sugar-free or diet soda. Seltzer water. Lowfat or no-fat milk. Milk alternatives, such as soy or almond milk. Fats and oils Olive oil. Canola oil. Sunflower oil. Grapeseed oil. Avocado. Walnuts. Sweets and desserts Sugar-free or low-fat pudding. Sugar-free or low-fat ice  cream and other frozen treats. Seasoning and other foods Herbs. Sodium-free spices. Mustard. Relish. Low-fat, low-sugar ketchup. Low-fat, low-sugar barbecue sauce. Low-fat or fat-free mayonnaise. What foods are not recommended? The items listed below may not be a complete list. Talk with your dietitian about what dietary choices are best for you. Grains Refined white flour and flour products, such as bread, pasta, snack foods, and cereals. Vegetables Canned vegetables. Frozen vegetables with butter or cream sauce. Fruits Fruits canned with syrup. Meats and other protein foods Fatty cuts of meat. Poultry with skin. Breaded or fried meat. Processed meats. Dairy Full-fat yogurt, cheese, or milk. Beverages Sweetened drinks, such as sweet iced tea and soda. Fats and oils Butter. Lard. Ghee. Sweets and desserts Baked goods, such as cake, cupcakes, pastries, cookies, and cheesecake. Seasoning and other foods Spice mixes with added salt. Ketchup. Barbecue sauce. Mayonnaise. Summary  To prevent diabetes from developing, you may need to make diet and other lifestyle changes to help control blood sugar, improve cholesterol levels, and manage your blood pressure.  Set weight loss goals with the help of your health care team. It is recommended that most people with prediabetes lose 7 percent of their current body weight.  Consider following a Mediterranean diet that includes plenty of fresh fruits and vegetables, whole grains, beans, nuts, seeds, fish, lean meat, low-fat dairy, and healthy oils. This information is not intended to replace advice given to you by your health care provider. Make sure you discuss any questions you have with your health care provider. Document Revised: 08/12/2018 Document Reviewed: 06/24/2016 Elsevier Patient Education  2020 [employer].

## 2020-03-05 NOTE — Progress Notes (Signed)
Patient ID: Timothy [name], male    DOB: [birth year]  MRN: [id number]  CC: Transition of care visit Date of hospitalization: 10/23-24/2021. Date of call from case worker: 02/28/2020  Subjective: Timothy [name] is a 64 y.o. male who presents for transition of care visit.  I last saw him in January 2019. His concerns today include:  hx of HTN, cardiac arrest vfib/vtach 06/2013, CAD with stentto RCA, COPD Gold stage III, former smoker, pre-DM, lung nodule on CT 08/2015 and bladder CA.  Patient hospitalized with acute inferior MI.  Found to have occlusion of overlapping stent in the mid RCA.  This area was successfully restented.  Echo revealed EF of 55-60% no RWMA noted.  Patient discharged home in stable condition on aspirin, Brilinta, high-dose statin therapy and bisoprolol.  Today: He reports that he is doing okay since hospital discharge.  He has not had any recurrent chest pains.  He has not had to use sublingual nitroglycerin.  Reports compliance with aspirin, Brilinta, atorvastatin and bisoprolol.  He has an appointment with the cardiologist in 2 days.  COPD: He had been off Symbicort for quite a while due to lack of follow-up.  It was prescribed for him on discharge from the hospital.  He states that it has helped his breathing a lot.  He struggles to breathe with walking short distances like from his house to the car which is about 100 feet.  He quit smoking 10 years ago.  He smoked for 38 years 2 packs/day.  He has not seen his pulmonologist Dr. Leonides Schanz in a while.  He sometimes checks his pulse ox at home and reports readings sometimes dipped into the 80s.  Prediabetes: Recent A1c done in hospital still in range for prediabetes but has progressed from when it was last checked 2 years ago with me.  He feels he could do better with his eating habits.  History of bladder CA: Last saw a urologist about 1 year ago.  States that he is due for a follow-up but he is a bit ambivalent about following  up because he will need cystoscopy and is not prepared to have that procedure done right now.  Denies any hematuria.  He is passing his urine okay.  HM: Due for flu shot.  Overdue for colon cancer screening.  He has never had a colonoscopy.  He does not wish to have a colonoscopy  but would consider other screening methods.  Patient Active Problem List   Diagnosis Date Noted  . Acute ST elevation myocardial infarction (STEMI) of inferior wall ([place]) 02/24/2020  . Acute lower UTI 04/23/2017  . Sepsis ([place]) 04/22/2017  . Hematuria, gross 04/01/2017  . Immunization due 02/23/2017  . Malignant neoplasm of urinary bladder ([place]) 02/23/2017  . Gross hematuria 11/20/2016  . Lung nodule 11/20/2016  . Gastroesophageal reflux disease without esophagitis 11/20/2016  . Old MI (myocardial infarction) 01/19/2015  . Prediabetes 12/29/2013  . Dental cavities 12/29/2013  . HTN (hypertension) 08/03/2013  . CAD (coronary artery disease) 06/19/2013  . Hyperlipidemia with target LDL less than 70 06/19/2013  . Tobacco abuse 06/19/2013  . NSTEMI (non-ST elevated myocardial infarction), DES to RCA 06/13/2013  . Cardiac arrest, hypothermic protocol, vfib/vtach.  Resp arrest 06/12/2013  . VT (ventricular tachycardia), requiring 2 shocks 06/12/2013  . COPD GOLD III 06/12/2013     Current Outpatient Medications on File Prior to Visit  Medication Sig Dispense Refill  . acetaminophen (TYLENOL) 500 MG tablet Take 500-1,000 mg by  mouth every 8 (eight) hours as needed for mild pain or headache.     . albuterol (VENTOLIN HFA) 108 (90 Base) MCG/ACT inhaler Inhale 2 puffs into the lungs every 6 (six) hours as needed for wheezing or shortness of breath. 8 g 0  . aspirin EC 81 MG tablet Take 1 tablet (81 mg total) by mouth daily. (Patient taking differently: Take 81 mg by mouth 2 (two) times a week. ) 90 tablet 2  . atorvastatin (LIPITOR) 80 MG tablet Take 1 tablet (80 mg total) by mouth every evening. 90 tablet 1  .  bisoprolol (ZEBETA) 5 MG tablet Take 0.5 tablets (2.5 mg total) by mouth at bedtime. 45 tablet 1  . nitroGLYCERIN (NITROSTAT) 0.4 MG SL tablet Place 1 tablet (0.4 mg total) under the tongue every 5 (five) minutes as needed for chest pain. NEED OV. 25 tablet 2  . ticagrelor (BRILINTA) 90 MG TABS tablet Take 1 tablet (90 mg total) by mouth 2 (two) times daily. 180 tablet 3   No current facility-administered medications on file prior to visit.    Allergies  Allergen Reactions  . Ace Inhibitors Other (See Comments)    Caused wheezing  . Aspirin Swelling and Other (See Comments)    Lips swell- still takes approx 2 times a week in 2021  . Ibuprofen Swelling and Other (See Comments)    Lips swell  . Losartan Other (See Comments)    Leg cramps    Social History   Socioeconomic History  . Marital status: Divorced    Spouse name: Not on file  . Number of children: Not on file  . Years of education: Not on file  . Highest education level: Not on file  Occupational History  . Not on file  Tobacco Use  . Smoking status: Former Smoker    Packs/day: 2.00    Years: 38.00    Pack years: 76.00    Types: Cigarettes    Quit date: 07/10/2008    Years since quitting: 11.6  . Smokeless tobacco: Never Used  Vaping Use  . Vaping Use: Never used  Substance and Sexual Activity  . Alcohol use: No  . Drug use: No    Types: Marijuana  . Sexual activity: Yes  Other Topics Concern  . Not on file  Social History Narrative  . Not on file   Social Determinants of Health   Financial Resource Strain:   . Difficulty of Paying Living Expenses: Not on file  Food Insecurity:   . Worried About Charity fundraiser in the Last Year: Not on file  . Ran Out of Food in the Last Year: Not on file  Transportation Needs:   . Lack of Transportation (Medical): Not on file  . Lack of Transportation (Non-Medical): Not on file  Physical Activity:   . Days of Exercise per Week: Not on file  . Minutes of Exercise  per Session: Not on file  Stress:   . Feeling of Stress : Not on file  Social Connections:   . Frequency of Communication with Friends and Family: Not on file  . Frequency of Social Gatherings with Friends and Family: Not on file  . Attends Religious Services: Not on file  . Active Member of Clubs or Organizations: Not on file  . Attends Archivist Meetings: Not on file  . Marital Status: Not on file  Intimate Partner Violence:   . Fear of Current or Ex-Partner: Not on file  .  Emotionally Abused: Not on file  . Physically Abused: Not on file  . Sexually Abused: Not on file    Family History  Problem Relation Age of Onset  . Hypertension Other     Past Surgical History:  Procedure Laterality Date  . cardiac sstent    . CORONARY ANGIOPLASTY WITH STENT PLACEMENT  06/12/13   [facility] [place] DES to RCA, NSTEMI  . CORONARY STENT INTERVENTION N/A 02/24/2020   Procedure: CORONARY STENT INTERVENTION;  Surgeon: Belva Crome, MD;  Location: [facility];  Service: Cardiovascular;  Laterality: N/A;  . CORONARY/GRAFT ACUTE MI REVASCULARIZATION N/A 02/24/2020   Procedure: Coronary/Graft Acute MI Revascularization;  Surgeon: Belva Crome, MD;  Location: [facility];  Service: Cardiovascular;  Laterality: N/A;  . CYSTOSCOPY W/ RETROGRADES Left 03/29/2017   Procedure: CYSTOSCOPY WITH LEFT RETROGRADE PYELOGRAM;  Surgeon: Kathie Rhodes, MD;  Location: [facility];  Service: Urology;  Laterality: Left;  . LEFT HEART CATH AND CORONARY ANGIOGRAPHY N/A 02/24/2020   Procedure: LEFT HEART CATH AND CORONARY ANGIOGRAPHY;  Surgeon: Belva Crome, MD;  Location: [facility];  Service: Cardiovascular;  Laterality: N/A;  . LEFT HEART CATHETERIZATION WITH CORONARY ANGIOGRAM N/A 06/12/2013   Procedure: LEFT HEART CATHETERIZATION WITH CORONARY ANGIOGRAM;  Surgeon: Troy Sine, MD;  Location: [facility];  Service: Cardiovascular;  Laterality: N/A;  . NO PAST SURGERIES    .  TRANSURETHRAL RESECTION OF BLADDER TUMOR N/A 03/29/2017   Procedure: TRANSURETHRAL RESECTION OF BLADDER TUMOR (TURBT)/;  Surgeon: Kathie Rhodes, MD;  Location: [facility];  Service: Urology;  Laterality: N/A;  . transurethral ressection of bladder tumor     Dr. Karsten Ro 03/29/17    ROS: Review of Systems Negative except as stated above  PHYSICAL EXAM: BP 134/79   Pulse 78   Resp 16   Wt 137 lb 9.6 oz (62.4 kg)   SpO2 90%   BMI 22.21 kg/m   Physical Exam Pulse ox 94 to 95% on room air sitting. Pulse ox 91 to 94% with ambulation around the nurses station. General appearance - alert, well appearing, and in no distress Mental status - normal mood, behavior, speech, dress, motor activity, and thought processes Nose - normal and patent, no erythema, discharge or polyps Mouth - mucous membranes moist, pharynx normal without lesions Neck - supple, no significant adenopathy Chest -breath sounds significantly decreased bilaterally with poor air entry.  No wheezes or crackles heard at this time.  Heart - normal rate, regular rhythm, normal S1, S2, no murmurs, rubs, clicks or gallops Extremities - peripheral pulses normal, no pedal edema, no clubbing or cyanosis Skin: Extensive resolving ecchymosis on the palmar surface of the right forearm.  Patient tells me that this was the through which access was made for the cardiac catheterization CMP Latest Ref Rng & Units 02/25/2020 02/24/2020 02/24/2020  Glucose 70 - 99 mg/dL 119(H) - 173(H)  BUN 8 - 23 mg/dL 12 - 19  Creatinine 0.61 - 1.24 mg/dL 1.01 1.05 0.80  Sodium 135 - 145 mmol/L 137 - 139  Potassium 3.5 - 5.1 mmol/L 4.2 - 3.9  Chloride 98 - 111 mmol/L 105 - 105  CO2 22 - 32 mmol/L 21(L) - -  Calcium 8.9 - 10.3 mg/dL 9.2 - -  Total Protein 6.5 - 8.1 g/dL - - -  Total Bilirubin 0.3 - 1.2 mg/dL - - -  Alkaline Phos 38 - 126 U/L - - -  AST 15 - 41 U/L - - -  ALT 0 - 44 U/L - - -   Lipid Panel     Component Value Date/Time   CHOL 268 (H)  02/24/2020 1253   CHOL 232 (H) 11/20/2016 0953   TRIG 212 (H) 02/24/2020 1253   HDL 48 02/24/2020 1253   HDL 50 11/20/2016 0953   CHOLHDL 5.6 02/24/2020 1253   VLDL 42 (H) 02/24/2020 1253   LDLCALC NOT CALCULATED 02/24/2020 1253   LDLCALC 153 (H) 11/20/2016 0953    CBC    Component Value Date/Time   WBC 19.5 (H) 02/25/2020 0659   RBC 5.56 02/25/2020 0659   HGB 16.6 02/25/2020 0659   HGB 17.3 11/20/2016 0953   HCT 49.8 02/25/2020 0659   HCT 50.3 11/20/2016 0953   PLT 375 02/25/2020 0659   PLT 353 11/20/2016 0953   MCV 89.6 02/25/2020 0659   MCV 90 11/20/2016 0953   MCH 29.9 02/25/2020 0659   MCHC 33.3 02/25/2020 0659   RDW 13.2 02/25/2020 0659   RDW 13.8 11/20/2016 0953   LYMPHSABS 4.1 (H) 02/24/2020 1253   MONOABS 1.6 (H) 02/24/2020 1253   EOSABS 0.4 02/24/2020 1253   BASOSABS 0.1 02/24/2020 1253    ASSESSMENT AND PLAN: 1. Hospital discharge follow-up   2. Acute ST elevation myocardial infarction (STEMI) of inferior wall (HCC) Clinically stable post recent hospitalization. Keep follow-up appointment with cardiology later this week. Continue DAPT.  Continue statin and beta-blocker.  3. Mixed hyperlipidemia See #2 above  4. COPD GOLD III Continue Symbicort.  I think he would benefit from also being on Spiriva. Referral to Dr. Melvyn Novas who he has not seen in a while. Flu shot given today. - tiotropium (SPIRIVA HANDIHALER) 18 MCG inhalation capsule; Place 1 capsule (18 mcg total) into inhaler and inhale daily.  Dispense: 30 capsule; Refill: 12 - budesonide-formoterol (SYMBICORT) 160-4.5 MCG/ACT inhaler; Inhale 2 puffs into the lungs 2 (two) times daily.  Dispense: 1 each; Refill: 6 - Ambulatory referral to Pulmonology  5. Prediabetes Discussed healthy eating habits.  Printed information given.  Discussed starting him on low-dose Metformin to help prevent progression to full diabetes.  Patient agreeable. - metFORMIN (GLUCOPHAGE) 500 MG tablet; Take 0.5 tablets (250 mg  total) by mouth daily with breakfast.  Dispense: 30 tablet; Refill: 3  6. Malignant neoplasm of urinary bladder, unspecified site [facility]) Recommend follow-up with urology for continued monitoring since it has been a year since he was last seen.  Patient declined having me submit the referral stating that he will call himself and schedule when he is ready.  7. Need for influenza vaccination Given today.  8. Screening for colon cancer Discussed colon cancer screening methods.  Advised that if he would like to pursue colonoscopy we would have to wait 6 months to a year until he is done with DAPT.  Patient prefers noninvasive methods for screening.  He is agreeable to doing Cologuard test. - Cologuard    Patient was given the opportunity to ask questions.  Patient verbalized understanding of the plan and was able to repeat key elements of the plan.   Orders Placed This Encounter  Procedures  . Cologuard  . Ambulatory referral to Pulmonology     Requested Prescriptions   Signed Prescriptions Disp Refills  . tiotropium (SPIRIVA HANDIHALER) 18 MCG inhalation capsule 30 capsule 12    Sig: Place 1 capsule (18 mcg total) into inhaler and inhale daily.  . budesonide-formoterol (SYMBICORT) 160-4.5 MCG/ACT inhaler 1 each 6    Sig: Inhale 2 puffs  into the lungs 2 (two) times daily.  . metFORMIN (GLUCOPHAGE) 500 MG tablet 30 tablet 3    Sig: Take 0.5 tablets (250 mg total) by mouth daily with breakfast.    Return in about 4 months (around 07/03/2020) for in-person.  Karle Plumber, MD, FACP

## 2020-03-05 NOTE — Progress Notes (Signed)
Patient presents for vaccination against influenza per orders of Dr. Johnson. Consent given. Counseling provided. No contraindications exists. Vaccine administered without incident.  ° °Luke Van Ausdall, PharmD, CPP °Clinical Pharmacist °[facility] °[phone] ° °

## 2020-03-07 ENCOUNTER — Encounter: Payer: Self-pay | Admitting: Cardiology

## 2020-03-07 ENCOUNTER — Other Ambulatory Visit: Payer: Self-pay

## 2020-03-07 ENCOUNTER — Ambulatory Visit: Payer: Medicare Other | Admitting: Cardiology

## 2020-03-07 VITALS — BP 132/74 | HR 79 | Ht 66.0 in | Wt 138.0 lb

## 2020-03-07 DIAGNOSIS — I10 Essential (primary) hypertension: Secondary | ICD-10-CM

## 2020-03-07 DIAGNOSIS — Z9861 Coronary angioplasty status: Secondary | ICD-10-CM

## 2020-03-07 DIAGNOSIS — I2510 Atherosclerotic heart disease of native coronary artery without angina pectoris: Secondary | ICD-10-CM | POA: Diagnosis not present

## 2020-03-07 DIAGNOSIS — I2119 ST elevation (STEMI) myocardial infarction involving other coronary artery of inferior wall: Secondary | ICD-10-CM | POA: Diagnosis not present

## 2020-03-07 DIAGNOSIS — E785 Hyperlipidemia, unspecified: Secondary | ICD-10-CM | POA: Diagnosis not present

## 2020-03-07 DIAGNOSIS — J449 Chronic obstructive pulmonary disease, unspecified: Secondary | ICD-10-CM

## 2020-03-07 MED ORDER — TICAGRELOR 90 MG PO TABS
90.0000 mg | ORAL_TABLET | Freq: Two times a day (BID) | ORAL | 3 refills | Status: DC
Start: 2020-03-07 — End: 2021-05-13

## 2020-03-07 NOTE — Assessment & Plan Note (Addendum)
[facility] follows Former 2PPD smoker- quit 10 years ago

## 2020-03-07 NOTE — Patient Instructions (Signed)
Medication Instructions:  Continue current medications  *If you need a refill on your cardiac medications before your next appointment, please call your pharmacy*   Lab Work: Fasting Lipid and CMP in 2 months  If you have labs (blood work) drawn today and your tests are completely normal, you will receive your results only by: Marland Kitchen MyChart Message (if you have MyChart) OR . A paper copy in the mail If you have any lab test that is abnormal or we need to change your treatment, we will call you to review the results.   Testing/Procedures: None Ordered   Follow-Up: At [facility], you and your health needs are our priority.  As part of our continuing mission to provide you with exceptional heart care, we have created designated Provider Care Teams.  These Care Teams include your primary Cardiologist (physician) and Advanced Practice Providers (APPs -  Physician Assistants and Nurse Practitioners) who all work together to provide you with the care you need, when you need it.  We recommend signing up for the patient portal called "MyChart".  Sign up information is provided on this After Visit Summary.  MyChart is used to connect with patients for Virtual Visits (Telemedicine).  Patients are able to view lab/test results, encounter notes, upcoming appointments, etc.  Non-urgent messages can be sent to your provider as well.   To learn more about what you can do with MyChart, go to [email or web address].    Your next appointment:   3 month(s)  The format for your next appointment:   In Person  Provider:   You may see Shelva Majestic, MD or one of the following Advanced Practice Providers on your designated Care Team:    Almyra Deforest, PA-C  Fabian Sharp, PA-C or   Roby Lofts, [place]

## 2020-03-07 NOTE — Assessment & Plan Note (Signed)
Check lipids in 8 weeks- LDL not calculated during his recent hospitalization

## 2020-03-07 NOTE — Assessment & Plan Note (Signed)
Normal LVF

## 2020-03-07 NOTE — Assessment & Plan Note (Signed)
Admitted 02/24/2020 with inferior STEMI secondary to RCA ISR

## 2020-03-07 NOTE — Assessment & Plan Note (Signed)
RCA PCI with DES 2015 ISR with inferior STEMI 02/24/2020-treated with re do PCI and overlapping DES

## 2020-03-07 NOTE — Progress Notes (Signed)
Cardiology Office Note:    Date:  03/07/2020   ID:  ERINN HUSKINS, DOB 02/02/1956, MRN [id number]  PCP:  Ladell Pier, MD  Cardiologist:  Shelva Majestic, MD  Electrophysiologist:  None   Referring MD: Ladell Pier, MD   CC: post hospital follow up  History of Present Illness:    Timothy [name] is a 64 y.o. male with a hx of CAD, s/p prior RCA PCI with DES in February 2015.  He was seen in the office in 2016 and 2018.  Other medical issues include COPD followed by [facility], dyslipidemia, prostate cancer, and hypertension.  The patient is seen in the office today as a post hospital follow-up.  He presented 02/24/2020 with an acute inferior ST EMI.  He was taken to the Cath Lab where he had an occlusion of the mid RCA within the previously stented segment.  He underwent PCI with overlapping DES placement.  He tolerated the procedure well.  There was no Lt system disease.  Ejection fraction was 55 to 65%.   The patient admitted to me that he had been a off all his medications for some time prior to his admission 02/24/2020.  He reports compliance with all his medications now.  I reviewed his cath findings with him in detail.  He does not work anymore and has not noticed any exertional chest discomfort or problems with his current medications.  In the past he says he did have problems (fatigue) with a higher dose of beta-blocker.  Past Medical History:  Diagnosis Date  . Altered mental status, improved at discharge 06/19/2013  . Bronchitis, chronic ([facility]) 06/19/2013  . CAD (coronary artery disease)    a. s/p cardiac arrest in 06/2013 with DES to RCA b. 02/2020: Inferior STEMI and cath showing total occlusion of mid-RCA treated with DES placement.   . Cancer ([facility])    bladder  . Cardiac arrest, hypothermic protocol 06/12/2013  . COPD exacerbation ([place]) 06/12/2013  . GERD (gastroesophageal reflux disease)   . Hyperlipidemia LDL goal < 70 06/19/2013  . Hypokalemia, replaced  06/19/2013  . Hypomagnesemia, replaced 06/19/2013  . Hypotension, requiring levophed 06/19/2013  . NSTEMI (non-ST elevated myocardial infarction), DES to RCA 06/13/2013  . Pre-diabetes   . Tobacco abuse 06/19/2013    Past Surgical History:  Procedure Laterality Date  . cardiac sstent    . CORONARY ANGIOPLASTY WITH STENT PLACEMENT  06/12/13   [facility] [place] DES to RCA, NSTEMI  . CORONARY STENT INTERVENTION N/A 02/24/2020   Procedure: CORONARY STENT INTERVENTION;  Surgeon: Belva Crome, MD;  Location: [facility];  Service: Cardiovascular;  Laterality: N/A;  . CORONARY/GRAFT ACUTE MI REVASCULARIZATION N/A 02/24/2020   Procedure: Coronary/Graft Acute MI Revascularization;  Surgeon: Belva Crome, MD;  Location: [facility];  Service: Cardiovascular;  Laterality: N/A;  . CYSTOSCOPY W/ RETROGRADES Left 03/29/2017   Procedure: CYSTOSCOPY WITH LEFT RETROGRADE PYELOGRAM;  Surgeon: Kathie Rhodes, MD;  Location: [facility];  Service: Urology;  Laterality: Left;  . LEFT HEART CATH AND CORONARY ANGIOGRAPHY N/A 02/24/2020   Procedure: LEFT HEART CATH AND CORONARY ANGIOGRAPHY;  Surgeon: Belva Crome, MD;  Location: [facility];  Service: Cardiovascular;  Laterality: N/A;  . LEFT HEART CATHETERIZATION WITH CORONARY ANGIOGRAM N/A 06/12/2013   Procedure: LEFT HEART CATHETERIZATION WITH CORONARY ANGIOGRAM;  Surgeon: Troy Sine, MD;  Location: [facility];  Service: Cardiovascular;  Laterality: N/A;  . NO PAST SURGERIES    .  TRANSURETHRAL RESECTION OF BLADDER TUMOR N/A 03/29/2017   Procedure: TRANSURETHRAL RESECTION OF BLADDER TUMOR (TURBT)/;  Surgeon: Kathie Rhodes, MD;  Location: [facility];  Service: Urology;  Laterality: N/A;  . transurethral ressection of bladder tumor     Dr. Karsten Ro 03/29/17    Current Medications: Current Meds  Medication Sig  . acetaminophen (TYLENOL) 500 MG tablet Take 500-1,000 mg by mouth every 8 (eight) hours as needed for mild pain or headache.   . albuterol  (VENTOLIN HFA) 108 (90 Base) MCG/ACT inhaler Inhale 2 puffs into the lungs every 6 (six) hours as needed for wheezing or shortness of breath.  Marland Kitchen aspirin EC 81 MG tablet Take 1 tablet (81 mg total) by mouth daily. (Patient taking differently: Take 81 mg by mouth 2 (two) times a week. )  . atorvastatin (LIPITOR) 80 MG tablet Take 1 tablet (80 mg total) by mouth every evening.  . bisoprolol (ZEBETA) 5 MG tablet Take 0.5 tablets (2.5 mg total) by mouth at bedtime.  . budesonide-formoterol (SYMBICORT) 160-4.5 MCG/ACT inhaler Inhale 2 puffs into the lungs 2 (two) times daily.  . metFORMIN (GLUCOPHAGE) 500 MG tablet Take 0.5 tablets (250 mg total) by mouth daily with breakfast.  . nitroGLYCERIN (NITROSTAT) 0.4 MG SL tablet Place 1 tablet (0.4 mg total) under the tongue every 5 (five) minutes as needed for chest pain. NEED OV.  . ticagrelor (BRILINTA) 90 MG TABS tablet Take 1 tablet (90 mg total) by mouth 2 (two) times daily.  Marland Kitchen tiotropium (SPIRIVA HANDIHALER) 18 MCG inhalation capsule Place 1 capsule (18 mcg total) into inhaler and inhale daily.  . [DISCONTINUED] ticagrelor (BRILINTA) 90 MG TABS tablet Take 1 tablet (90 mg total) by mouth 2 (two) times daily.     Allergies:   Ace inhibitors, Aspirin, Ibuprofen, and Losartan   Social History   Socioeconomic History  . Marital status: Divorced    Spouse name: Not on file  . Number of children: Not on file  . Years of education: Not on file  . Highest education level: Not on file  Occupational History  . Not on file  Tobacco Use  . Smoking status: Former Smoker    Packs/day: 2.00    Years: 38.00    Pack years: 76.00    Types: Cigarettes    Quit date: 07/10/2008    Years since quitting: 11.6  . Smokeless tobacco: Never Used  Vaping Use  . Vaping Use: Never used  Substance and Sexual Activity  . Alcohol use: No  . Drug use: No    Types: Marijuana  . Sexual activity: Yes  Other Topics Concern  . Not on file  Social History Narrative  .  Not on file   Social Determinants of Health   Financial Resource Strain:   . Difficulty of Paying Living Expenses: Not on file  Food Insecurity:   . Worried About Charity fundraiser in the Last Year: Not on file  . Ran Out of Food in the Last Year: Not on file  Transportation Needs:   . Lack of Transportation (Medical): Not on file  . Lack of Transportation (Non-Medical): Not on file  Physical Activity:   . Days of Exercise per Week: Not on file  . Minutes of Exercise per Session: Not on file  Stress:   . Feeling of Stress : Not on file  Social Connections:   . Frequency of Communication with Friends and Family: Not on file  . Frequency of Social Gatherings with Friends  and Family: Not on file  . Attends Religious Services: Not on file  . Active Member of Clubs or Organizations: Not on file  . Attends Archivist Meetings: Not on file  . Marital Status: Not on file     Family History: The patient's family history includes Hypertension in an other family member.  ROS:   Please see the history of present illness.     All other systems reviewed and are negative.  EKGs/Labs/Other Studies Reviewed:    The following studies were reviewed today: Cath/PCI 02/24/2020  EKG:  EKG is ordered today.  The ekg ordered today demonstrates NSR, HR 79  Recent Labs: 02/24/2020: ALT 21 02/25/2020: BUN 12; Creatinine, Ser 1.01; Hemoglobin 16.6; Magnesium 2.1; Platelets 375; Potassium 4.2; Sodium 137  Recent Lipid Panel    Component Value Date/Time   CHOL 268 (H) 02/24/2020 1253   CHOL 232 (H) 11/20/2016 0953   TRIG 212 (H) 02/24/2020 1253   HDL 48 02/24/2020 1253   HDL 50 11/20/2016 0953   CHOLHDL 5.6 02/24/2020 1253   VLDL 42 (H) 02/24/2020 1253   LDLCALC NOT CALCULATED 02/24/2020 1253   LDLCALC 153 (H) 11/20/2016 0953    Physical Exam:    VS:  BP 132/74 (BP Location: Left Arm, Patient Position: Sitting, Cuff Size: Normal)   Pulse 79   Ht 5\' 6"  (1.676 m)   Wt 138 lb  (62.6 kg)   BMI 22.27 kg/m     Wt Readings from Last 3 Encounters:  03/07/20 138 lb (62.6 kg)  03/05/20 137 lb 9.6 oz (62.4 kg)  02/24/20 133 lb 2.5 oz (60.4 kg)     GEN: Well nourished, well developed in no acute distress HEENT: Normal NECK: No JVD; No carotid bruits CARDIAC: RRR, no murmurs, rubs, gallops RESPIRATORY:  Clear to auscultation without rales, wheezing or rhonchi  ABDOMEN: Soft, non-tender, non-distended MUSCULOSKELETAL:  No edema; No deformity-Rt radial site ecchymosis, no hematoma SKIN: Warm and dry NEUROLOGIC:  Alert and oriented x 3 PSYCHIATRIC:  Normal affect   ASSESSMENT:    Acute STEMI of inferior wall  Admitted 02/24/2020 with inferior STEMI secondary to RCA ISR  CAD S/P percutaneous coronary angioplasty RCA PCI with DES 2015 ISR with inferior STEMI 02/24/2020-treated with re do PCI and overlapping DES  Hyperlipidemia with target LDL less than 70 Check lipids in 8 weeks- LDL not calculated during his recent hospitalization  Essential hypertension Normal LVF  COPD GOLD III [facility] follows Former 2PPD smoker- quit 10 years ago  PLAN:    Same Rx- I stressed the importance of medication compliance.  Check lipids and a CMET in 8 weeks, f/u Dr Claiborne Billings in 3 months.    Medication Adjustments/Labs and Tests Ordered: Current medicines are reviewed at length with the patient today.  Concerns regarding medicines are outlined above.  Orders Placed This Encounter  Procedures  . Lipid panel  . Comprehensive Metabolic Panel (CMET)  . EKG 12-Lead   Meds ordered this encounter  Medications  . ticagrelor (BRILINTA) 90 MG TABS tablet    Sig: Take 1 tablet (90 mg total) by mouth 2 (two) times daily.    Dispense:  180 tablet    Refill:  3    Patient Instructions  Medication Instructions:  Continue current medications  *If you need a refill on your cardiac medications before your next appointment, please call your pharmacy*   Lab  Work: Fasting Lipid and CMP in 2 months  If you have labs (blood  work) drawn today and your tests are completely normal, you will receive your results only by: Marland Kitchen MyChart Message (if you have MyChart) OR . A paper copy in the mail If you have any lab test that is abnormal or we need to change your treatment, we will call you to review the results.   Testing/Procedures: None Ordered   Follow-Up: At [facility], you and your health needs are our priority.  As part of our continuing mission to provide you with exceptional heart care, we have created designated Provider Care Teams.  These Care Teams include your primary Cardiologist (physician) and Advanced Practice Providers (APPs -  Physician Assistants and Nurse Practitioners) who all work together to provide you with the care you need, when you need it.  We recommend signing up for the patient portal called "MyChart".  Sign up information is provided on this After Visit Summary.  MyChart is used to connect with patients for Virtual Visits (Telemedicine).  Patients are able to view lab/test results, encounter notes, upcoming appointments, etc.  Non-urgent messages can be sent to your provider as well.   To learn more about what you can do with MyChart, go to [email or web address].    Your next appointment:   3 month(s)  The format for your next appointment:   In Person  Provider:   You may see Shelva Majestic, MD or one of the following Advanced Practice Providers on your designated Care Team:    Almyra Deforest, PA-C  Fabian Sharp, PA-C or   Roby Lofts, PA-C       Signed, Kerin Ransom, [place]  03/07/2020 2:00 PM    [place]

## 2020-05-09 ENCOUNTER — Ambulatory Visit: Payer: Medicare Other | Admitting: Internal Medicine

## 2020-05-09 ENCOUNTER — Encounter: Payer: Self-pay | Admitting: Internal Medicine

## 2020-05-09 ENCOUNTER — Other Ambulatory Visit: Payer: Self-pay

## 2020-05-09 DIAGNOSIS — R0609 Other forms of dyspnea: Secondary | ICD-10-CM

## 2020-05-09 DIAGNOSIS — J449 Chronic obstructive pulmonary disease, unspecified: Secondary | ICD-10-CM

## 2020-05-09 DIAGNOSIS — R0600 Dyspnea, unspecified: Secondary | ICD-10-CM

## 2020-05-09 MED ORDER — BREZTRI AEROSPHERE 160-9-4.8 MCG/ACT IN AERO: INHALATION_SPRAY | RESPIRATORY_TRACT | 11 refills | Status: DC

## 2020-05-09 MED ORDER — BREZTRI AEROSPHERE 160-9-4.8 MCG/ACT IN AERO: 2.0000 | INHALATION_SPRAY | Freq: Two times a day (BID) | RESPIRATORY_TRACT | 0 refills | Status: DC

## 2020-05-09 NOTE — Patient Instructions (Addendum)
Plan A = Automatic = Always=    Stop symbicort and start Breztri Take 2 puffs first thing in am and then another 2 puffs about 12 hours later.   Work on inhaler technique:  relax and gently blow all the way out then take a nice smooth deep breath back in, triggering the inhaler at same time you start breathing in.  Hold for up to 5 seconds if you can. Blow out thru nose. Rinse and gargle with water when done    Plan B = Backup (to supplement plan A, not to replace it) Only use your albuterol inhaler as a rescue medication to be used if you can't catch your breath by resting or doing a relaxed purse lip breathing pattern.  - The less you use it, the better it will work when you need it. - Ok to use the inhaler up to 2 puffs  every 4 hours if you must but call for appointment if use goes up over your usual need - Don't leave home without it !!  (think of it like the spare tire for your car)   Plan C = Crisis (instead of Plan B but only if Plan B stops working) - only use your albuterol nebulizer if you first try Plan B and it fails to help > ok to use the nebulizer up to every 4 hours but if start needing it regularly call for immediate appointment    Please remember to go to the lab department   for your tests - we will call you with the results when they are available.      Please schedule a follow up office visit in 6 weeks, call sooner if needed with pfts on return

## 2020-05-09 NOTE — Progress Notes (Signed)
Timothy [name], male    DOB: 1956-02-05,  MRN: [id number]   Brief patient profile:   41 yowm quit smoking 2010 with GOLD III criteria 2015 maint on symbicort 160 2bid and losing ground with ex tol so referred to pulmonary clinic 05/09/2020 by Dr   Laural Benes     History of Present Illness  05/09/2020  Pulmonary/ 1st office eval/Sayvion Vigen symb maint / insurance would not pay spiriva Chief Complaint  Patient presents with  . Pulmonary Consult     Referred by Dr Jonah Blue. Pt seen here last in 2015 for COPD. He c/o increased SOB over the past 5 years. He states he gets SOB walking up hill and "not too far".  He is using his albuterol inhaler at 4-6 x per wk on average.    Dyspnea:  Grocery shopping limit is [employer] - can't do [employer] anymore, mailbox is 50 ft and no hill steps at slower pace = MMRC3 = can't walk 100 yards even at a slow pace at a flat grade s stopping due to sob   Cough: am congestion/ white  Sleep: on side two pillows flat bed  SABA use: saba hfa    No obvious day to day or daytime variability or assoc excess/ purulent sputum or mucus plugs or hemoptysis or cp or chest tightness, subjective wheeze or overt sinus or hb symptoms.   Sleeping  without nocturnal  or early am exacerbation  of respiratory  c/o's or need for noct saba. Also denies any obvious fluctuation of symptoms with weather or environmental changes or other aggravating or alleviating factors except as outlined above   No unusual exposure hx or h/o childhood pna/ asthma or knowledge of premature birth.  Current Allergies, Complete Past Medical History, Past Surgical History, Family History, and Social History were reviewed in [employer] record.  ROS  The following are not active complaints unless bolded Hoarseness, sore throat, dysphagia, dental problems, itching, sneezing,  nasal congestion or discharge of excess mucus or purulent secretions, ear ache,   fever, chills, sweats, unintended  wt loss or wt gain, classically pleuritic or exertional cp,  orthopnea pnd or arm/hand swelling  or leg swelling, presyncope, palpitations, abdominal pain, anorexia, nausea, vomiting, diarrhea  or change in bowel habits or change in bladder habits, change in stools or change in urine, dysuria, hematuria,  rash, arthralgias, visual complaints, headache, numbness, weakness or ataxia or problems with walking or coordination,  change in mood or  memory.           Past Medical History:  Diagnosis Date  . Altered mental status, improved at discharge 06/19/2013  . Bronchitis, chronic ([facility]) 06/19/2013  . CAD (coronary artery disease)    a. s/p cardiac arrest in 06/2013 with DES to RCA b. 02/2020: Inferior STEMI and cath showing total occlusion of mid-RCA treated with DES placement.   . Cancer ([facility])    bladder  . Cardiac arrest, hypothermic protocol 06/12/2013  . COPD exacerbation ([facility]) 06/12/2013  . GERD (gastroesophageal reflux disease)   . Hyperlipidemia LDL goal < 70 06/19/2013  . Hypokalemia, replaced 06/19/2013  . Hypomagnesemia, replaced 06/19/2013  . Hypotension, requiring levophed 06/19/2013  . NSTEMI (non-ST elevated myocardial infarction), DES to RCA 06/13/2013  . Pre-diabetes   . Tobacco abuse 06/19/2013    Outpatient Medications Prior to Visit  Medication Sig Dispense Refill  . acetaminophen (TYLENOL) 500 MG tablet Take 500-1,000 mg by mouth every 8 (eight) hours as needed for mild  pain or headache.     . albuterol (VENTOLIN HFA) 108 (90 Base) MCG/ACT inhaler Inhale 2 puffs into the lungs every 6 (six) hours as needed for wheezing or shortness of breath. 8 g 0  . aspirin EC 81 MG tablet Take 1 tablet (81 mg total) by mouth daily. (Patient taking differently: Take 81 mg by mouth 2 (two) times a week.) 90 tablet 2  . atorvastatin (LIPITOR) 80 MG tablet Take 1 tablet (80 mg total) by mouth every evening. 90 tablet 1  . bisoprolol (ZEBETA) 5 MG tablet Take 0.5 tablets (2.5 mg total) by mouth at  bedtime. 45 tablet 1  . metFORMIN (GLUCOPHAGE) 500 MG tablet Take 0.5 tablets (250 mg total) by mouth daily with breakfast. 30 tablet 3  . nitroGLYCERIN (NITROSTAT) 0.4 MG SL tablet Place 1 tablet (0.4 mg total) under the tongue every 5 (five) minutes as needed for chest pain. NEED OV. 25 tablet 2  . ticagrelor (BRILINTA) 90 MG TABS tablet Take 1 tablet (90 mg total) by mouth 2 (two) times daily. 180 tablet 3  . budesonide-formoterol (SYMBICORT) 160-4.5 MCG/ACT inhaler Inhale 2 puffs into the lungs 2 (two) times daily. (Patient not taking: Reported on 05/09/2020) 1 each 6  . tiotropium (SPIRIVA HANDIHALER) 18 MCG inhalation capsule Place 1 capsule (18 mcg total) into inhaler and inhale daily. (Patient not taking: Reported on 05/09/2020) 30 capsule 12   No facility-administered medications prior to visit.     Objective:     BP 124/68 (BP Location: Left Arm, Cuff Size: Normal)   Pulse 73   Temp (!) 97.1 F (36.2 C) (Temporal)   Ht 5\' 6"  (1.676 m)   Wt 131 lb (59.4 kg)   SpO2 96% Comment: on RA  BMI 21.14 kg/m   SpO2: 96 % (on RA)   HEENT : pt wearing mask not removed for exam due to covid -19 concerns.    NECK :  without JVD/Nodes/TM/ nl carotid upstrokes bilaterally   LUNGS: no acc muscle use,  Mod barrel  contour chest wall with bilateral  Distant bs s audible wheeze and  without cough on insp or exp maneuvers and mod  Hyperresonant  to  percussion bilaterally     CV:  RRR  no s3 or murmur or increase in P2, and no edema   ABD:  soft and nontender with pos mid insp Hoover's  in the supine position. No bruits or organomegaly appreciated, bowel sounds nl  MS:     ext warm without deformities, calf tenderness, cyanosis or clubbing No obvious joint restrictions   SKIN: warm and dry without lesions    NEURO:  alert, approp, nl sensorium with  no motor or cerebellar deficits apparent.        I personally reviewed images and agree with radiology impression as follows:  CXR:    Portable 02/24/20 No acute cardiopulmonary abnormality seen. Emphysema (ICD10-J43.9).  Labs ordered/ reviewed:      Chemistry      Component Value Date/Time   NA 138 05/09/2020 1648   NA 141 05/27/2017 0954   K 5.1 No hemolysis seen 05/09/2020 1648   CL 102 05/09/2020 1648   CO2 32 05/09/2020 1648   BUN 18 05/09/2020 1648   BUN 17 05/27/2017 0954   CREATININE 1.09 05/09/2020 1648   CREATININE 0.74 10/21/2015 1056      Component Value Date/Time   CALCIUM 10.3 05/09/2020 1648   ALKPHOS 78 02/24/2020 1253   AST 17 02/24/2020 1253  ALT 21 02/24/2020 1253   BILITOT 0.5 02/24/2020 1253   BILITOT 0.4 11/20/2016 0953        Lab Results  Component Value Date   WBC 16.0 (H) 05/09/2020   HGB 15.9 05/09/2020   HCT 47.6 05/09/2020   MCV 88.7 05/09/2020   PLT 380.0 05/09/2020     No results found for: DDIMER    Lab Results  Component Value Date   TSH 1.91 05/09/2020     Lab Results  Component Value Date   PROBNP 29.0 05/09/2020      No results found for: ESRSEDRATE           Assessment   COPD GOLD III Quit smoking in 2010  - HFA 75% p coaching 09/01/13 > started on symbicort 160 2 bid  - Spirometry 09/15/13  FEV1 1.67 (49%) ratio 44 % - 05/09/2020  After extensive coaching inhaler device,  effectiveness =    75% > breztri 2bid -  05/09/2020   Walked RA  2 laps @ approx 242ft each @ avg pace  stopped due sob / sats 93%   DDX of  difficult airways management almost all start with A and  include Adherence, Ace Inhibitors, Acid Reflux, Active Sinus Disease, Alpha 1 Antitripsin deficiency, Anxiety masquerading as Airways dz,  ABPA,  Allergy(esp in young), Aspiration (esp in elderly), Adverse effects of meds,  Active smoking or vaping, A bunch of PE's (a small clot burden can't cause this syndrome unless there is already severe underlying pulm or vascular dz with poor reserve) plus two Bs  = Bronchiectasis and Beta blocker use..and one C= CHF  Adherence is always the initial  "prime suspect" and is a multilayered concern that requires a "trust but verify" approach in every patient - starting with knowing how to use medications, especially inhalers, correctly, keeping up with refills and understanding the fundamental difference between maintenance and prns vs those medications only taken for a very short course and then stopped and not refilled.  - see hfa teaching - return with all meds in hand using a trust but verify approach to confirm accurate Medication  Reconciliation The principal here is that until we are certain that the  patients are doing what we've asked, it makes no sense to ask them to do more.   ?allergy /asthma component > check eos/ IgEhigh dose ics plus lama/laba until establish new baseline then ? Change to lama/laba if not exacerbating?  ? Alpha one AT def > check phenotype  ? Anemia/thyroid disorder  ? chf > check bnp    >>> f/u in 6 weeks with pfts on return   Advised:  formulary restrictions will be an ongoing challenge for the forseable future and I would be happy to pick an alternative if the pt will first  provide me a list of them -  pt  will need to return here for training for any new device that is required eg dpi vs hfa vs respimat.    In the meantime we can always provide samples so that the patient never runs out of any needed respiratory medications.           Each maintenance medication was reviewed in detail including emphasizing most importantly the difference between maintenance and prns and under what circumstances the prns are to be triggered using an action plan format where appropriate.  Total time for H and P, chart review, counseling, teaching device  directly observing portions of ambulatory 02 saturation study/  and generating customized AVS unique to this office visit / charting = 45 min              Christinia Gully, MD 06/20/2020

## 2020-05-09 NOTE — Assessment & Plan Note (Addendum)
Quit smoking in 2010  - HFA 75% p coaching 09/01/13 > started on symbicort 160 2 bid  - Spirometry 09/15/13  FEV1 1.67 (49%) ratio 44 % - 05/09/2020  After extensive coaching inhaler device,  effectiveness =    75% > breztri 2bid -  05/09/2020   Walked RA  2 laps @ approx 260ft each @ avg pace  stopped due sob / sats 93%   DDX of  difficult airways management almost all start with A and  include Adherence, Ace Inhibitors, Acid Reflux, Active Sinus Disease, Alpha 1 Antitripsin deficiency, Anxiety masquerading as Airways dz,  ABPA,  Allergy(esp in young), Aspiration (esp in elderly), Adverse effects of meds,  Active smoking or vaping, A bunch of PE's (a small clot burden can't cause this syndrome unless there is already severe underlying pulm or vascular dz with poor reserve) plus two Bs  = Bronchiectasis and Beta blocker use..and one C= CHF  Adherence is always the initial "prime suspect" and is a multilayered concern that requires a "trust but verify" approach in every patient - starting with knowing how to use medications, especially inhalers, correctly, keeping up with refills and understanding the fundamental difference between maintenance and prns vs those medications only taken for a very short course and then stopped and not refilled.  - see hfa teaching - return with all meds in hand using a trust but verify approach to confirm accurate Medication  Reconciliation The principal here is that until we are certain that the  patients are doing what we've asked, it makes no sense to ask them to do more.   ?allergy /asthma component > check eos/ IgE  high dose ics plus lama/laba until establish new baseline then ? Change to lama/laba if not exacerbating?  ? Alpha one AT def > check phenotype  MM  ? Anemia/thyroid disorder> both ruled out today   BB effects > unlikely only bisoprolol  ? chf > check bnp >  Nl    >>> f/u in 6 weeks with pfts on return   Advised:  formulary restrictions will be an  ongoing challenge for the forseable future and I would be happy to pick an alternative if the pt will first  provide me a list of them -  pt  will need to return here for training for any new device that is required eg dpi vs hfa vs respimat.    In the meantime we can always provide samples so that the patient never runs out of any needed respiratory medications.           Each maintenance medication was reviewed in detail including emphasizing most importantly the difference between maintenance and prns and under what circumstances the prns are to be triggered using an action plan format where appropriate.  Total time for H and P, chart review, counseling, teaching device  directly observing portions of ambulatory 02 saturation study/ and generating customized AVS unique to this office visit / charting = 45 min

## 2020-05-10 LAB — CBC WITH DIFFERENTIAL/PLATELET
Basophils Absolute: 0.1 10*3/uL (ref 0.0–0.1)
Basophils Relative: 0.8 % (ref 0.0–3.0)
Eosinophils Absolute: 0.6 10*3/uL (ref 0.0–0.7)
Eosinophils Relative: 3.9 % (ref 0.0–5.0)
HCT: 47.6 % (ref 39.0–52.0)
Hemoglobin: 15.9 g/dL (ref 13.0–17.0)
Lymphocytes Relative: 19.8 % (ref 12.0–46.0)
Lymphs Abs: 3.2 10*3/uL (ref 0.7–4.0)
MCHC: 33.5 g/dL (ref 30.0–36.0)
MCV: 88.7 fl (ref 78.0–100.0)
Monocytes Absolute: 1.8 10*3/uL — ABNORMAL HIGH (ref 0.1–1.0)
Monocytes Relative: 11 % (ref 3.0–12.0)
Neutro Abs: 10.3 10*3/uL — ABNORMAL HIGH (ref 1.4–7.7)
Neutrophils Relative %: 64.5 % (ref 43.0–77.0)
Platelets: 380 10*3/uL (ref 150.0–400.0)
RBC: 5.36 Mil/uL (ref 4.22–5.81)
RDW: 14.4 % (ref 11.5–15.5)
WBC: 16 10*3/uL — ABNORMAL HIGH (ref 4.0–10.5)

## 2020-05-10 LAB — BASIC METABOLIC PANEL
BUN: 18 mg/dL (ref 6–23)
CO2: 32 mEq/L (ref 19–32)
Calcium: 10.3 mg/dL (ref 8.4–10.5)
Chloride: 102 mEq/L (ref 96–112)
Creatinine, Ser: 1.09 mg/dL (ref 0.40–1.50)
GFR: 72.34 mL/min (ref >60.00)
Glucose, Bld: 91 mg/dL (ref 70–99)
Potassium: 5.1 mEq/L (ref 3.5–5.1)
Sodium: 138 mEq/L (ref 135–145)

## 2020-05-10 LAB — TSH: TSH: 1.91 u[IU]/mL (ref 0.35–4.50)

## 2020-05-10 LAB — BRAIN NATRIURETIC PEPTIDE: Pro B Natriuretic peptide (BNP): 29 pg/mL (ref 0.0–100.0)

## 2020-05-13 ENCOUNTER — Telehealth: Payer: Self-pay | Admitting: Internal Medicine

## 2020-05-13 MED ORDER — BREZTRI AEROSPHERE 160-9-4.8 MCG/ACT IN AERO: INHALATION_SPRAY | RESPIRATORY_TRACT | 11 refills | Status: DC

## 2020-05-13 NOTE — Telephone Encounter (Signed)
Spoke with pt. He states that he dropped off printed rx for Breztri at [facility] on [address] and went to pick it up and they didn't have any record of having the rx. Pt advised that I have sent Breztri rx electronically to pharmacy. PT verbalized understanding. Nothing further needed.

## 2020-05-16 LAB — IGE: IgE (Immunoglobulin E), Serum: 162 kU/L — ABNORMAL HIGH (ref <=114)

## 2020-05-16 LAB — ALPHA-1 ANTITRYPSIN PHENOTYPE: A-1 Antitrypsin, Ser: 182 mg/dL (ref 83–199)

## 2020-05-20 NOTE — Progress Notes (Signed)
Spoke with pt and notified of results per Dr. Wert. Pt verbalized understanding and denied any questions. 

## 2020-06-20 ENCOUNTER — Encounter: Payer: Self-pay | Admitting: Internal Medicine

## 2020-06-20 ENCOUNTER — Other Ambulatory Visit: Payer: Self-pay

## 2020-06-20 ENCOUNTER — Ambulatory Visit: Payer: Medicare Other | Admitting: Internal Medicine

## 2020-06-20 DIAGNOSIS — J449 Chronic obstructive pulmonary disease, unspecified: Secondary | ICD-10-CM

## 2020-06-20 NOTE — Assessment & Plan Note (Signed)
No evidence chf/ anemia/ thyroid dz/ renal failure

## 2020-06-20 NOTE — Patient Instructions (Signed)
No change in medications    I very strongly recommend you get the   pfizer vaccine booster as soon as possible based on your risk of dying from the virus  and the proven safety and benefit of these vaccines against even the delta and omicron variants.  This can save your life as well as  those of your loved ones,  especially if they are also not vaccinated.    Please schedule a follow up visit in 6  months but call sooner if needed

## 2020-06-20 NOTE — Progress Notes (Signed)
Timothy [name], male    DOB: 1956-01-07,  MRN: [id number]   Brief patient profile:   81 yowm MM/quit smoking 2010 with GOLD III criteria 2015 maint on symbicort 160 2bid and losing ground with ex tol so referred to pulmonary clinic 05/09/2020 by Dr   Timothy [name]     History of Present Illness  05/09/2020  Pulmonary/ 1st office eval/Timothy [name] symb maint / insurance would not pay spiriva Chief Complaint  Patient presents with  . Pulmonary Consult     Referred by Dr Timothy [name]. Pt seen here last in 2015 for COPD. He c/o increased SOB over the past 5 years. He states he gets SOB walking up hill and "not too far".  He is using his albuterol inhaler at 4-6 x per wk on average.    Dyspnea:  Grocery shopping limit is [employer] - can't do [employer] anymore, mailbox is 50 ft and no hill steps at slower pace = MMRC3 = can't walk 100 yards even at a slow pace at a flat grade s stopping due to sob   Cough: am congestion/ white  Sleep: on side two pillows flat bed  SABA use: saba hfa  rec Plan A = Automatic = Always=    Stop symbicort and start Breztri Take 2 puffs first thing in am and then another 2 puffs about 12 hours later.  Work on inhaler technique:  Plan B = Backup (to supplement plan A, not to replace it) Only use your albuterol inhaler as a rescue medication Plan C = Crisis (instead of Plan B but only if Plan B stops working) - only use your albuterol nebulizer if you first try Plan B and it fails to help > ok to use the nebulizer up to every 4 hours but if start needing it regularly call for immediate appointment         06/20/2020  f/u ov/Timothy [name] re: copd  III breztri  2bid > much improved  Chief Complaint  Patient presents with  . Follow-up    Breathing is much improved on the [facility] and he uses the albuterol inhaler now maybe once per wk.    Dyspnea:  Now MMRC1 = can walk nl pace, flat grade, can't hurry or go uphills or steps s sob   Cough: minimal in am/ better  Sleeping: bed flat/ on  side / 2 pillows  SABA use: very little  02: none  Covid status:   Double vax 09/24/19 pfizer    No obvious day to day or daytime variability or assoc excess/ purulent sputum or mucus plugs or hemoptysis or cp or chest tightness, subjective wheeze or overt sinus or hb symptoms.   .sleeping  without nocturnal  exacerbation  of respiratory  c/o's or need for noct saba. Also denies any obvious fluctuation of symptoms with weather or environmental changes or other aggravating or alleviating factors except as outlined above   No unusual exposure hx or h/o childhood pna/ asthma or knowledge of premature birth.  Current Allergies, Complete Past Medical History, Past Surgical History, Family History, and Social History were reviewed in [employer] record.  ROS  The following are not active complaints unless bolded Hoarseness, sore throat, dysphagia, dental problems, itching, sneezing,  nasal congestion or discharge of excess mucus or purulent secretions, ear ache,   fever, chills, sweats, unintended wt loss or wt gain, classically pleuritic or exertional cp,  orthopnea pnd or arm/hand swelling  or leg swelling, presyncope, palpitations, abdominal pain,  anorexia, nausea, vomiting, diarrhea  or change in bowel habits or change in bladder habits, change in stools or change in urine, dysuria, hematuria,  rash, arthralgias, visual complaints, headache, numbness, weakness or ataxia or problems with walking or coordination,  change in mood or  memory.        Current Meds  Medication Sig  . acetaminophen (TYLENOL) 500 MG tablet Take 500-1,000 mg by mouth every 8 (eight) hours as needed for mild pain or headache.   . albuterol (VENTOLIN HFA) 108 (90 Base) MCG/ACT inhaler Inhale 2 puffs into the lungs every 6 (six) hours as needed for wheezing or shortness of breath.  Marland Kitchen aspirin EC 81 MG tablet Take 1 tablet (81 mg total) by mouth daily. (Patient taking differently: Take 81 mg by mouth 2  (two) times a week.)  . atorvastatin (LIPITOR) 80 MG tablet Take 1 tablet (80 mg total) by mouth every evening.  . bisoprolol (ZEBETA) 5 MG tablet Take 0.5 tablets (2.5 mg total) by mouth at bedtime.  . Budeson-Glycopyrrol-Formoterol (BREZTRI AEROSPHERE) 160-9-4.8 MCG/ACT AERO Take 2 puffs first thing in am and then another 2 puffs about 12 hours later.  . metFORMIN (GLUCOPHAGE) 500 MG tablet Take 0.5 tablets (250 mg total) by mouth daily with breakfast.  . nitroGLYCERIN (NITROSTAT) 0.4 MG SL tablet Place 1 tablet (0.4 mg total) under the tongue every 5 (five) minutes as needed for chest pain. NEED OV.  . ticagrelor (BRILINTA) 90 MG TABS tablet Take 1 tablet (90 mg total) by mouth 2 (two) times daily.               Past Medical History:  Diagnosis Date  . Altered mental status, improved at discharge 06/19/2013  . Bronchitis, chronic ([place]) 06/19/2013  . CAD (coronary artery disease)    a. s/p cardiac arrest in 06/2013 with DES to RCA b. 02/2020: Inferior STEMI and cath showing total occlusion of mid-RCA treated with DES placement.   . Cancer ([place])    bladder  . Cardiac arrest, hypothermic protocol 06/12/2013  . COPD exacerbation ([place]) 06/12/2013  . GERD (gastroesophageal reflux disease)   . Hyperlipidemia LDL goal < 70 06/19/2013  . Hypokalemia, replaced 06/19/2013  . Hypomagnesemia, replaced 06/19/2013  . Hypotension, requiring levophed 06/19/2013  . NSTEMI (non-ST elevated myocardial infarction), DES to RCA 06/13/2013  . Pre-diabetes   . Tobacco abuse 06/19/2013        Objective:     Wt Readings from Last 3 Encounters:  06/20/20 133 lb (60.3 kg)  05/09/20 131 lb (59.4 kg)  03/07/20 138 lb (62.6 kg)      Vital signs reviewed  06/20/2020  - Note at rest 02 sats  96% on RA   General appearance:   amb elderly wm nad    HEENT : pt wearing mask not removed for exam due to covid -19 concerns.    NECK :  without JVD/Nodes/TM/ nl carotid upstrokes bilaterally   LUNGS: no acc  muscle use,  Mod barrel  contour chest wall with bilateral  Distant bs s audible wheeze and  without cough on insp or exp maneuvers and mod  Hyperresonant  to  percussion bilaterally     CV:  RRR  no s3 or murmur or increase in P2, and no edema   ABD:  soft and nontender with pos mid insp Hoover's  in the supine position. No bruits or organomegaly appreciated, bowel sounds nl  MS:     ext warm without deformities, calf tenderness,  cyanosis or clubbing No obvious joint restrictions   SKIN: warm and dry without lesions    NEURO:  alert, approp, nl sensorium with  no motor or cerebellar deficits apparent.                    Assessment

## 2020-06-20 NOTE — Assessment & Plan Note (Addendum)
MM/quit smoking in 2010  - HFA 75% p coaching 09/01/13 > started on symbicort 160 2 bid  - Spirometry 09/15/13  FEV1 1.67 (49%) ratio 44 % - 05/09/2020  After extensive coaching inhaler device,  effectiveness =    75% > breztri 2bid -  05/09/2020   Walked RA  2 laps @ approx 248ft each @ avg pace  stopped due sob / sats 93%  - alpha one AT phenotype  MM,  Level 182  - Allergy profile 05/09/20 >  Eos 0.6/  IgE  162   Pt is Group B in terms of symptom/risk and laba/lama therefore appropriate rx at this point >>>  Much better on breztri, minimal saba  Re covid: Patient is  Vaccinated > 6 m prior to [place] fully protected  and was informed of the seriousness of COVID 19 infection as a direct risk to lung health as well as safety to close contacts and should continue to wear a facemask in public and minimize exposure to public locations but especially avoid any area or activity where non-close contacts are not observing distancing or wearing an appropriate face mask.  I strongly recommended pt take either of the 3rd pfizer  through local drugstores based on updated information on millions of Americans treated with the [employer] products  which have proven both safe and  effective even against the  delta and new omicron variant to prevent hospitalization and death if all 3 doses taken.           Each maintenance medication was reviewed in detail including emphasizing most importantly the difference between maintenance and prns and under what circumstances the prns are to be triggered using an action plan format where appropriate.  Total time for H and P, chart review, counseling, reviewing hfa device(s) and generating customized AVS unique to this office visit / same day charting = 25 min

## 2020-06-24 ENCOUNTER — Other Ambulatory Visit: Payer: Self-pay | Admitting: Internal Medicine

## 2020-06-24 DIAGNOSIS — J449 Chronic obstructive pulmonary disease, unspecified: Secondary | ICD-10-CM

## 2020-06-28 ENCOUNTER — Other Ambulatory Visit (HOSPITAL_COMMUNITY)
Admission: RE | Admit: 2020-06-28 | Discharge: 2020-06-28 | Disposition: A | Discharge status: Home / Self Care | Payer: Medicare Other | Source: Ambulatory Visit | Attending: Internal Medicine | Admitting: Internal Medicine

## 2020-06-28 DIAGNOSIS — Z20822 Contact with and (suspected) exposure to covid-19: Secondary | ICD-10-CM | POA: Insufficient documentation

## 2020-06-28 DIAGNOSIS — Z01812 Encounter for preprocedural laboratory examination: Secondary | ICD-10-CM | POA: Diagnosis not present

## 2020-06-28 LAB — SARS CORONAVIRUS 2 (TAT 6-24 HRS): SARS Coronavirus 2: NEGATIVE

## 2020-07-01 ENCOUNTER — Encounter: Payer: Self-pay | Admitting: *Deleted

## 2020-07-01 ENCOUNTER — Other Ambulatory Visit: Payer: Self-pay

## 2020-07-01 ENCOUNTER — Encounter: Payer: Self-pay | Admitting: Internal Medicine

## 2020-07-01 ENCOUNTER — Ambulatory Visit: Payer: Medicare Other | Admitting: Internal Medicine

## 2020-07-01 ENCOUNTER — Ambulatory Visit: Payer: Medicare Other

## 2020-07-01 DIAGNOSIS — R0902 Hypoxemia: Secondary | ICD-10-CM | POA: Diagnosis not present

## 2020-07-01 DIAGNOSIS — J449 Chronic obstructive pulmonary disease, unspecified: Secondary | ICD-10-CM | POA: Diagnosis not present

## 2020-07-01 LAB — PULMONARY FUNCTION TEST
DL/VA % pred: 46 %
DL/VA: 1.97 ml/min/mmHg/L
DLCO cor % pred: 39 %
DLCO cor: 9.09 ml/min/mmHg
DLCO unc % pred: 40 %
DLCO unc: 9.4 ml/min/mmHg
FEF 25-75 Post: 0.65 L/sec
FEF 25-75 Pre: 0.47 L/sec
FEF2575-%Change-Post: 37 %
FEF2575-%Pred-Post: 27 %
FEF2575-%Pred-Pre: 19 %
FEV1-%Change-Post: 6 %
FEV1-%Pred-Post: 46 %
FEV1-%Pred-Pre: 43 %
FEV1-Post: 1.35 L
FEV1-Pre: 1.27 L
FEV1FVC-%Change-Post: 0 %
FEV1FVC-%Pred-Pre: 64 %
FEV6-%Change-Post: 10 %
FEV6-%Pred-Post: 73 %
FEV6-%Pred-Pre: 66 %
FEV6-Post: 2.7 L
FEV6-Pre: 2.43 L
FEV6FVC-%Change-Post: 4 %
FEV6FVC-%Pred-Post: 101 %
FEV6FVC-%Pred-Pre: 97 %
FVC-%Change-Post: 6 %
FVC-%Pred-Post: 72 %
FVC-%Pred-Pre: 68 %
FVC-Post: 2.8 L
FVC-Pre: 2.64 L
Post FEV1/FVC ratio: 48 %
Post FEV6/FVC ratio: 96 %
Pre FEV1/FVC ratio: 48 %
Pre FEV6/FVC Ratio: 92 %
RV % pred: 174 %
RV: 3.55 L
TLC % pred: 108 %
TLC: 6.53 L

## 2020-07-01 MED ORDER — BREZTRI AEROSPHERE 160-9-4.8 MCG/ACT IN AERO: 2.0000 | INHALATION_SPRAY | Freq: Two times a day (BID) | RESPIRATORY_TRACT | 0 refills | Status: DC

## 2020-07-01 NOTE — Patient Instructions (Addendum)
Make sure you check your oxygen saturation at your highest level of activity to be sure it stays over 88-90% and keep track of it at least once a week, more often if breathing getting worse, and let me know if losing ground.  If you find you want to want faster/ farther without  Shortness of breath then we can get you approved for portable 02   Work on inhaler technique:  relax and gently blow all the way out then take a nice smooth deep breath back in, triggering the inhaler at same time you start breathing in.  Hold for up to 5 seconds if you can. Blow breztri out thru nose. Rinse and gargle with water when done     Keep your appt - call if needed in meantime

## 2020-07-01 NOTE — Progress Notes (Signed)
Timothy [name], male    DOB: 1956/02/06,  MRN: [id number]   Brief patient profile:   41 yowm MM/quit smoking 2010 with GOLD III criteria 2015 maint on symbicort 160 2bid and losing ground with ex tol so referred to pulmonary clinic 05/09/2020 by Dr   Wynetta Emery   [employer]     History of Present Illness  05/09/2020  Pulmonary/ 1st office eval/Timothy [name] symb maint / insurance would not pay spiriva Chief Complaint  Patient presents with  . Pulmonary Consult     Referred by Dr Karle Plumber. Pt seen here last in 2015 for COPD. He c/o increased SOB over the past 5 years. He states he gets SOB walking up hill and "not too far".  He is using his albuterol inhaler at 4-6 x per wk on average.    Dyspnea:  Grocery shopping limit is [employer] - can't do [employer] anymore, mailbox is 50 ft and no hill steps at slower pace = MMRC3 = can't walk 100 yards even at a slow pace at a flat grade s stopping due to sob   Cough: am congestion/ white  Sleep: on side two pillows flat bed  SABA use: saba hfa  rec Plan A = Automatic = Always=    Stop symbicort and start Breztri Take 2 puffs first thing in am and then another 2 puffs about 12 hours later.  Work on inhaler technique:  Plan B = Backup (to supplement plan A, not to replace it) Only use your albuterol inhaler as a rescue medication Plan C = Crisis (instead of Plan B but only if Plan B stops working) - only use your albuterol nebulizer if you first try Plan B and it fails to help > ok to use the nebulizer up to every 4 hours but if start needing it regularly call for immediate appointment         06/20/2020  f/u ov/Timothy [name] re: copd  III breztri  2bid > much improved  Chief Complaint  Patient presents with  . Follow-up    Breathing is much improved on the [employer] [facility] and he uses the albuterol inhaler now maybe once per wk.    Dyspnea:  Now MMRC1 = can walk nl pace, flat grade, can't hurry or go uphills or steps s sob   Cough: minimal in am/ better   Sleeping: bed flat/ on side / 2 pillows  SABA use: very little  02: none  Covid status:   Double vax 09/24/19 pfizer  rec No change in medications  I very strongly recommend you get the   pfizer vaccine booster    07/01/2020  f/u ov/Timothy [name] re: COPD III / breztri  Chief Complaint  Patient presents with  . Follow-up    SOB   Dyspnea:  MMRC1 = can walk nl pace, flat grade, can't hurry or go uphills or steps s sob  And does desaturate but declines 02  Cough: much better Sleeping: flat bed/ on side on 2 pillows  SABA use: min 02: none Covid status:   vax max    No obvious day to day or daytime variability or assoc excess/ purulent sputum or mucus plugs or hemoptysis or cp or chest tightness, subjective wheeze or overt sinus or hb symptoms.   Sleeping  without nocturnal  or early am exacerbation  of respiratory  c/o's or need for noct saba. Also denies any obvious fluctuation of symptoms with weather or environmental changes or other aggravating or alleviating factors  except as outlined above   No unusual exposure hx or h/o childhood pna/ asthma or knowledge of premature birth.  Current Allergies, Complete Past Medical History, Past Surgical History, Family History, and Social History were reviewed in [employer] record.  ROS  The following are not active complaints unless bolded Hoarseness, sore throat, dysphagia, dental problems, itching, sneezing,  nasal congestion or discharge of excess mucus or purulent secretions, ear ache,   fever, chills, sweats, unintended wt loss or wt gain, classically pleuritic or exertional cp,  orthopnea pnd or arm/hand swelling  or leg swelling, presyncope, palpitations, abdominal pain, anorexia, nausea, vomiting, diarrhea  or change in bowel habits or change in bladder habits, change in stools or change in urine, dysuria, hematuria,  rash, arthralgias, visual complaints, headache, numbness, weakness or ataxia or problems with walking or  coordination,  change in mood or  memory.        Current Meds  Medication Sig  . acetaminophen (TYLENOL) 500 MG tablet Take 500-1,000 mg by mouth every 8 (eight) hours as needed for mild pain or headache.   . albuterol (VENTOLIN HFA) 108 (90 Base) MCG/ACT inhaler Inhale 2 puffs into the lungs every 6 (six) hours as needed for wheezing or shortness of breath.  Marland Kitchen aspirin EC 81 MG tablet Take 1 tablet (81 mg total) by mouth daily. (Patient taking differently: Take 81 mg by mouth 2 (two) times a week.)  . atorvastatin (LIPITOR) 80 MG tablet Take 1 tablet (80 mg total) by mouth every evening.  . bisoprolol (ZEBETA) 5 MG tablet Take 0.5 tablets (2.5 mg total) by mouth at bedtime.  . Budeson-Glycopyrrol-Formoterol (BREZTRI AEROSPHERE) 160-9-4.8 MCG/ACT AERO Take 2 puffs first thing in am and then another 2 puffs about 12 hours later.  . metFORMIN (GLUCOPHAGE) 500 MG tablet Take 0.5 tablets (250 mg total) by mouth daily with breakfast.  . nitroGLYCERIN (NITROSTAT) 0.4 MG SL tablet Place 1 tablet (0.4 mg total) under the tongue every 5 (five) minutes as needed for chest pain. NEED OV.  . ticagrelor (BRILINTA) 90 MG TABS tablet Take 1 tablet (90 mg total) by mouth 2 (two) times daily.                 Past Medical History:  Diagnosis Date  . Altered mental status, improved at discharge 06/19/2013  . Bronchitis, chronic ([place]) 06/19/2013  . CAD (coronary artery disease)    a. s/p cardiac arrest in 06/2013 with DES to RCA b. 02/2020: Inferior STEMI and cath showing total occlusion of mid-RCA treated with DES placement.   . Cancer ([place])    bladder  . Cardiac arrest, hypothermic protocol 06/12/2013  . COPD exacerbation ([place]) 06/12/2013  . GERD (gastroesophageal reflux disease)   . Hyperlipidemia LDL goal < 70 06/19/2013  . Hypokalemia, replaced 06/19/2013  . Hypomagnesemia, replaced 06/19/2013  . Hypotension, requiring levophed 06/19/2013  . NSTEMI (non-ST elevated myocardial infarction), DES to RCA  06/13/2013  . Pre-diabetes   . Tobacco abuse 06/19/2013        Objective:     07/01/2020        132   06/20/20 133 lb (60.3 kg)  05/09/20 131 lb (59.4 kg)  03/07/20 138 lb (62.6 kg)     Vital signs reviewed  07/01/2020  - Note at rest 02 sats  94% on RA   General appearance:    Stoic amb wm nad    HEENT : pt wearing mask not removed for exam due to  covid -19 concerns.    NECK :  without JVD/Nodes/TM/ nl carotid upstrokes bilaterally   LUNGS: no acc muscle use,  Mod barrel  contour chest wall with bilateral  Distant bs s audible wheeze and  without cough on insp or exp maneuvers and mod  Hyperresonant  to  percussion bilaterally     CV:  RRR  no s3 or murmur or increase in P2, and no edema   ABD:  soft and nontender with pos mid insp Hoover's  in the supine position. No bruits or organomegaly appreciated, bowel sounds nl  MS:     ext warm without deformities, calf tenderness, cyanosis or clubbing No obvious joint restrictions   SKIN: warm and dry without lesions    NEURO:  alert, approp, nl sensorium with  no motor or cerebellar deficits apparent.                    Assessment

## 2020-07-02 ENCOUNTER — Encounter: Payer: Self-pay | Admitting: Internal Medicine

## 2020-07-02 DIAGNOSIS — R0902 Hypoxemia: Secondary | ICD-10-CM | POA: Insufficient documentation

## 2020-07-02 NOTE — Assessment & Plan Note (Addendum)
Reported 07/01/2020  - walking sats in office ok 05/09/20   Given his dlco not surprising that he reports desats with hills and offered to restudy today but based on newly published data in stable copd, there is no hard and fast rule as to when to start amb 02 and fine with me if he just learns to pace at a level where he maintains above 88% and if not wants to consider we can do another qualifying study at any time         Each maintenance medication was reviewed in detail including emphasizing most importantly the difference between maintenance and prns and under what circumstances the prns are to be triggered using an action plan format where appropriate.  Total time for H and P, chart review, counseling, reviewing hfa device(s) and generating customized AVS unique to this office visit / same day charting = 31 min

## 2020-07-02 NOTE — Assessment & Plan Note (Signed)
MM/quit smoking in 2010  - HFA 75% p coaching 09/01/13 > started on symbicort 160 2 bid  - Spirometry 09/15/13  FEV1 1.67 (49%) ratio 44 % - 05/09/2020   breztri 2bid -  05/09/2020   Walked RA  2 laps @ approx 274ft each @ avg pace  stopped due sob / sats 93%  - alpha one AT phenotype  MM,  Level 182  - Allergy profile 05/09/20 >  Eos 0.6/  IgE  162  - PFT's  07/01/2020  FEV1 1.35 (46 % ) ratio 0.48 p 6 % improvement from saba p breztri x 2 prior to study with DLCO  9.40 (40%) corrects to 1.97 (45%)  for alv volume and FV curve classic concavity   - 07/01/2020  After extensive coaching inhaler device,  effectiveness =  80%   Group D in terms of symptom/risk and laba/lama/ICS  therefore appropriate rx at this point >>>  Continue breztri plus appropriate saba  Re saba: I spent extra time with pt today reviewing appropriate use of albuterol for prn use on exertion with the following points: 1) saba is for relief of sob that does not improve by walking a slower pace or resting but rather if the pt does not improve after trying this first. 2) If the pt is convinced, as many are, that saba helps recover from activity faster then it's easy to tell if this is the case by re-challenging : ie stop, take the inhaler, then p 5 minutes try the exact same activity (intensity of workload) that just caused the symptoms and see if they are substantially diminished or not after saba 3) if there is an activity that reproducibly causes the symptoms, try the saba 15 min before the activity on alternate days   If in fact the saba really does help, then fine to continue to use it prn but advised may need to look closer at the maintenance regimen being used to achieve better control of airways disease with exertion.

## 2020-07-05 ENCOUNTER — Ambulatory Visit: Payer: Medicare Other | Admitting: Cardiovascular Disease

## 2020-07-05 ENCOUNTER — Other Ambulatory Visit: Payer: Self-pay

## 2020-07-05 ENCOUNTER — Encounter: Payer: Self-pay | Admitting: Cardiovascular Disease

## 2020-07-05 DIAGNOSIS — I10 Essential (primary) hypertension: Secondary | ICD-10-CM

## 2020-07-05 DIAGNOSIS — J449 Chronic obstructive pulmonary disease, unspecified: Secondary | ICD-10-CM

## 2020-07-05 DIAGNOSIS — I5189 Other ill-defined heart diseases: Secondary | ICD-10-CM

## 2020-07-05 DIAGNOSIS — I2119 ST elevation (STEMI) myocardial infarction involving other coronary artery of inferior wall: Secondary | ICD-10-CM | POA: Diagnosis not present

## 2020-07-05 DIAGNOSIS — E785 Hyperlipidemia, unspecified: Secondary | ICD-10-CM

## 2020-07-05 DIAGNOSIS — I2111 ST elevation (STEMI) myocardial infarction involving right coronary artery: Secondary | ICD-10-CM | POA: Diagnosis not present

## 2020-07-05 NOTE — Patient Instructions (Signed)
Medication Instructions:  The current medical regimen is effective;  continue present plan and medications.  *If you need a refill on your cardiac medications before your next appointment, please call your pharmacy*   Lab Work: Fasting lab work (CBC, CMET, LIPID)  If you have labs (blood work) drawn today and your tests are completely normal, you will receive your results only by: Marland Kitchen MyChart Message (if you have MyChart) OR . A paper copy in the mail If you have any lab test that is abnormal or we need to change your treatment, we will call you to review the results.   Follow-Up: At [facility], you and your health needs are our priority.  As part of our continuing mission to provide you with exceptional heart care, we have created designated Provider Care Teams.  These Care Teams include your primary Cardiologist (physician) and Advanced Practice Providers (APPs -  Physician Assistants and Nurse Practitioners) who all work together to provide you with the care you need, when you need it.  We recommend signing up for the patient portal called "MyChart".  Sign up information is provided on this After Visit Summary.  MyChart is used to connect with patients for Virtual Visits (Telemedicine).  Patients are able to view lab/test results, encounter notes, upcoming appointments, etc.  Non-urgent messages can be sent to your provider as well.   To learn more about what you can do with MyChart, go to [email or web address].    Your next appointment:   6 month(s)  The format for your next appointment:   In Person  Provider:   Shelva Majestic, MD

## 2020-07-05 NOTE — Progress Notes (Signed)
Patient ID: Timothy [name], male   DOB: 31-Jul-1955, 65 y.o.   MRN: [id number]     PCP: Karle Plumber, MD   HPI: Timothy [name] is a 65 y.o. male presents for a follow-up cardiology evaluation.  I last saw him in October 2018.  Timothy [name] presented to [facility] in the early morning of 06/12/2013 after being found down at home. When EMS arrived he was in ventricular fibrillation. He was transported acutely to [facility]  cardiac catheterization laboratory and hypothermia protocol was implemented. Emergent catheterization by me revealed total occlusion of the RCA and he underwent successful intervention with ultimate insertion of a 2.5x18 mm DES post dilated to 2.75 mm with the percent occlusion reduced to 0% and with resumption of brisk TIMI 3 flow. He ultimately awoke and was intact neurologically after the hypothermia protocol completed his course. He did require levophed initially for hypotension. He was discharged on 06/19/2013.    He was sent home on aspirin and brilinta for dual anti-platelet therapy, metoprolol 75 mg twice a day lisinopril 2.5 mg, Crestor 20 mg. 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 At that time, I further titrate his metoprolol tartrate to 100 mg twice a day. I recommended laboratory be checked in the fasting state. 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  Timothy [name] never did undergo his studies. He stated that he was unable to afford the cost since his insurance has not yet become effective.  When I last saw him, did run out of his early the and I re-instituted therapy.  He also was on an ACE inhibitor and had significant wheezing and I recommended discontinuance of the ACE inhibitor.  He was ultimately started on losartan 25 mg and bisoprolol 5 mg and tolerating this well from a pulmonary perspective.  He was on Symbicort 160/4.5 and when necessary Ventolin without recent  wheezing.  He underwent a echo Doppler study on 11/06/2013 which was essentially normal; Ejection fraction 55-60% and he did not have any regional wall motion abnormality.  PA pressure was minimally elevated at 32 mm.  I last saw him in October 2018 and prior to that evaluation had not seen him since  September 2016.  In 2016.  His LDL was 45 on Crestor.  Over the past several years, he apparently stopped taking Crestor due to cost.  He also had self weaned Brilinta and was taking 30 mg twice a day until recently and has not taken any for the last several weeks.  He continued to be on aspirin 81 mg and bisoprolol 2.5 mg at bedtime.  Apparently in July he had follow-up laboratory and his LDL cholesterol had risen to 153.  He was started on atorvastatin 40 mg, which he has been tolerating.  He also is on breo ellipta and takes albuterol as needed.  He takes pantoprazole for GERD.  In October 2018 I saw him for preoperative clearance. He had been recently evaluated by Dr. Karsten Ro and will require transurethral resection of bladder tumor and intravesical chemotherapy.   He admitted to rare to occasional episodes of chest pain which often are nonexertional.  He also admits to occasional shortness of breath with activity.  I recommended he undergo a preoperative nuclear study which was done in March 01, 2017 which was low risk and showed essentially normal perfusion without ischemia, EF 59%.  An echo Doppler study showed normal LV function with grade 2 diastolic dysfunction and mild mitral valve prolapse.  I have not seen him since 2018.  Apparently he developed an inferior ST segment elevation myocardial infarction and underwent cardiac catheterization on February 24, 2020 by Dr. Tamala Julian was found to have total mid RCA occlusion which was successfully stented with overlapping Resolute stents.  He was seen in follow-up by Kerin Ransom on March 07, 2020.  Apparently prior to his February 24, 2020 hospitalization he  had been off all his medications for some time.  Presently, he denies any recurrent chest pain.  He sees Dr. Melvyn Novas for COPD and was recently started on Breztri Aerosphere inhalation.  He continues to be on aspirin and Brilinta.  He is on bisoprolol 2.5 mg at bedtime, atorvastatin 80 for hyperlipidemia and Metformin 250 mg daily for diabetes.  He presents for evaluation.   Past Medical History:  Diagnosis Date  . Altered mental status, improved at discharge 06/19/2013  . Bronchitis, chronic ([place]) 06/19/2013  . CAD (coronary artery disease)    a. s/p cardiac arrest in 06/2013 with DES to RCA b. 02/2020: Inferior STEMI and cath showing total occlusion of mid-RCA treated with DES placement.   . Cancer ([place])    bladder  . Cardiac arrest, hypothermic protocol 06/12/2013  . COPD exacerbation ([place]) 06/12/2013  . GERD (gastroesophageal reflux disease)   . Hyperlipidemia LDL goal < 70 06/19/2013  . Hypokalemia, replaced 06/19/2013  . Hypomagnesemia, replaced 06/19/2013  . Hypotension, requiring levophed 06/19/2013  . NSTEMI (non-ST elevated myocardial infarction), DES to RCA 06/13/2013  . Pre-diabetes   . Tobacco abuse 06/19/2013    Past Surgical History:  Procedure Laterality Date  . cardiac sstent    . CORONARY ANGIOPLASTY WITH STENT PLACEMENT  06/12/13   Xience [place] DES to RCA, NSTEMI  . CORONARY STENT INTERVENTION N/A 02/24/2020   Procedure: CORONARY STENT INTERVENTION;  Surgeon: Belva Crome, MD;  Location: [place] [facility];  Service: Cardiovascular;  Laterality: N/A;  . CORONARY/GRAFT ACUTE MI REVASCULARIZATION N/A 02/24/2020   Procedure: Coronary/Graft Acute MI Revascularization;  Surgeon: Belva Crome, MD;  Location: [place] [facility];  Service: Cardiovascular;  Laterality: N/A;  . CYSTOSCOPY W/ RETROGRADES Left 03/29/2017   Procedure: CYSTOSCOPY WITH LEFT RETROGRADE PYELOGRAM;  Surgeon: Kathie Rhodes, MD;  Location: [facility];  Service: Urology;  Laterality: Left;  . LEFT HEART CATH AND  CORONARY ANGIOGRAPHY N/A 02/24/2020   Procedure: LEFT HEART CATH AND CORONARY ANGIOGRAPHY;  Surgeon: Belva Crome, MD;  Location: [place] [facility];  Service: Cardiovascular;  Laterality: N/A;  . LEFT HEART CATHETERIZATION WITH CORONARY ANGIOGRAM N/A 06/12/2013   Procedure: LEFT HEART CATHETERIZATION WITH CORONARY ANGIOGRAM;  Surgeon: Troy Sine, MD;  Location: [facility] C[facility];  Service: Cardiovascular;  Laterality: N/A;  . NO PAST SURGERIES    . TRANSURETHRAL RESECTION OF BLADDER TUMOR N/A 03/29/2017   Procedure: TRANSURETHRAL RESECTION OF BLADDER TUMOR (TURBT)/;  Surgeon: Kathie Rhodes, MD;  Location: [facility];  Service: Urology;  Laterality: N/A;  . transurethral ressection of bladder tumor     Dr. Karsten Ro 03/29/17    Allergies  Allergen Reactions  . Ace Inhibitors Other (See Comments)    Caused wheezing  . Aspirin Swelling and Other (See Comments)    Lips swell- still takes approx 2 times a week in 2021  . Ibuprofen Swelling and Other (See Comments)    Lips swell  . Losartan Other (See Comments)  Leg cramps    Current Outpatient Medications  Medication Sig Dispense Refill  . acetaminophen (TYLENOL) 500 MG tablet Take 500-1,000 mg by mouth every 8 (eight) hours as needed for mild pain or headache.     . albuterol (VENTOLIN HFA) 108 (90 Base) MCG/ACT inhaler Inhale 2 puffs into the lungs every 6 (six) hours as needed for wheezing or shortness of breath. 8 g 0  . aspirin EC 81 MG tablet Take 1 tablet (81 mg total) by mouth daily. (Patient taking differently: Take 81 mg by mouth 2 (two) times a week.) 90 tablet 2  . atorvastatin (LIPITOR) 80 MG tablet Take 1 tablet (80 mg total) by mouth every evening. 90 tablet 1  . bisoprolol (ZEBETA) 5 MG tablet Take 0.5 tablets (2.5 mg total) by mouth at bedtime. 45 tablet 1  . Budeson-Glycopyrrol-Formoterol (BREZTRI AEROSPHERE) 160-9-4.8 MCG/ACT AERO Inhale 2 puffs into the lungs 2 (two) times daily. 5.9 g 0  . metFORMIN (GLUCOPHAGE) 500 MG  tablet Take 0.5 tablets (250 mg total) by mouth daily with breakfast. 30 tablet 3  . nitroGLYCERIN (NITROSTAT) 0.4 MG SL tablet Place 1 tablet (0.4 mg total) under the tongue every 5 (five) minutes as needed for chest pain. NEED OV. 25 tablet 2  . ticagrelor (BRILINTA) 90 MG TABS tablet Take 1 tablet (90 mg total) by mouth 2 (two) times daily. 180 tablet 3   No current facility-administered medications for this visit.    Social History   Socioeconomic History  . Marital status: Divorced    Spouse name: Not on file  . Number of children: Not on file  . Years of education: Not on file  . Highest education level: Not on file  Occupational History  . Not on file  Tobacco Use  . Smoking status: Former Smoker    Packs/day: 2.00    Years: 38.00    Pack years: 76.00    Types: Cigarettes    Quit date: 07/10/2008    Years since quitting: 12.0  . Smokeless tobacco: Never Used  Vaping Use  . Vaping Use: Never used  Substance and Sexual Activity  . Alcohol use: No  . Drug use: No    Types: Marijuana  . Sexual activity: Yes  Other Topics Concern  . Not on file  Social History Narrative  . Not on file   Social Determinants of Health   Financial Resource Strain: Not on file  Food Insecurity: Not on file  Transportation Needs: Not on file  Physical Activity: Not on file  Stress: Not on file  Social Connections: Not on file  Intimate Partner Violence: Not on file   Socially, he has a previous 30 year history of smoking, but quit.  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  Family History  Problem Relation Age of Onset  . Hypertension Other      ROS General: Negative; No fevers, chills, or night sweats;  HEENT: Negative; No changes in vision or hearing, sinus congestion, difficulty swallowing Pulmonary: Positive for occasional wheezing. Cardiovascular: See history of present illness GI: Negative; No nausea, vomiting, diarrhea, or  abdominal pain GU: Negative; No dysuria, hematuria, or difficulty voiding Musculoskeletal: Negative; no myalgias, joint pain, or weakness Hematologic/Oncology: Negative; no easy bruising, bleeding Endocrine: Negative; no heat/cold intolerance; no diabetes Neuro: Negative; no changes in balance, headaches Skin: Negative; No rashes or skin lesions Psychiatric: Negative; No behavioral problems, depression Sleep: Negative; No snoring, daytime sleepiness,  hypersomnolence, bruxism, restless legs, hypnogognic hallucinations, no cataplexy Other comprehensive 14 point system review is negative.   PE BP (!) 107/59   Pulse 76   Ht $R'5\' 6"'Nl$  (1.676 m)   Wt 132 lb 12.8 oz (60.2 kg)   SpO2 95%   BMI 21.43 kg/m    Repeat blood pressure by me was 118/70  Wt Readings from Last 3 Encounters:  07/05/20 132 lb 12.8 oz (60.2 kg)  07/01/20 132 lb 6.4 oz (60.1 kg)  06/20/20 133 lb (60.3 kg)   General: Alert, oriented, no distress.  Skin: normal turgor, no rashes, warm and dry HEENT: Normocephalic, atraumatic. Pupils equal round and reactive to light; sclera anicteric; extraocular muscles intact;  Nose without nasal septal hypertrophy Mouth/Parynx benign; Mallinpatti scale 3 Neck: No JVD, no carotid bruits; normal carotid upstroke Lungs: clear to ausculatation and percussion; no wheezing or rales Chest wall: without tenderness to palpitation Heart: PMI not displaced, RRR, s1 s2 normal, 1/6 systolic murmur, no diastolic murmur, no rubs, gallops, thrills, or heaves Abdomen: soft, nontender; no hepatosplenomehaly, BS+; abdominal aorta nontender and not dilated by palpation. Back: no CVA tenderness Pulses 2+ Musculoskeletal: full range of motion, normal strength, no joint deformities Extremities: no clubbing cyanosis or edema, Homan's sign negative  Neurologic: grossly nonfocal; Cranial nerves grossly wnl Psychologic: Normal mood and affect    ECG (independently read by me): NSR at 76, no ST changes,  no ectopy  October 2018 ECG (independently read by me): Normal sinus rhythm at 84 bpm.  No evidence for prior MI.  Normal intervals.  No ST segment changes.  September 2016 ECG (independently read by me): Normal sinus rhythm at 68 bpm.  Normal ECG.  August 2015 ECG (independently read by me): Normal sinus rhythm at 69 beats per minute.  Normal intervals.  Normal EKG   Prior 09/04/2013 ECG (independently read by me): Normal sinus rhythm at 69 beats per minute.  A small Q wave in aVL.  Otherwise, no ECG evidence for recent myocardial infarction  Prior ECG (independently read by me) normal sinus rhythm at 72 beats per minute. QS in V1 and V2.  Prior 07/10/2013 ECG (independently read by me): Normal sinus rhythm 74 beats per minute. Normal intervals. No ECG evidence of his recent myocardial infarction  LABS: BMP Latest Ref Rng & Units 05/09/2020 02/25/2020 02/24/2020  Glucose 70 - 99 mg/dL 91 119(H) -  BUN 6 - 23 mg/dL 18 12 -  Creatinine 0.40 - 1.50 mg/dL 1.09 1.01 1.05  BUN/Creat Ratio 10 - 24 - - -  Sodium 135 - 145 mEq/L 138 137 -  Potassium 3.5 - 5.1 mEq/L 5.1 No hemolysis seen 4.2 -  Chloride 96 - 112 mEq/L 102 105 -  CO2 19 - 32 mEq/L 32 21(L) -  Calcium 8.4 - 10.5 mg/dL 10.3 9.2 -   Hepatic Function Latest Ref Rng & Units 02/24/2020 04/24/2017 04/23/2017  Total Protein 6.5 - 8.1 g/dL 7.0 6.1(L) 6.7  Albumin 3.5 - 5.0 g/dL 3.8 2.7(L) 3.3(L)  AST 15 - 41 U/L $Remo'17 17 26  'Sfqtn$ ALT 0 - 44 U/L $Remo'21 22 26  'NVRVE$ Alk Phosphatase 38 - 126 U/L 78 76 83  Total Bilirubin 0.3 - 1.2 mg/dL 0.5 0.9 0.8   CBC Latest Ref Rng & Units 05/09/2020 02/25/2020 02/24/2020  WBC 4.0 - 10.5 K/uL 16.0(H) 19.5(H) 19.5(H)  Hemoglobin 13.0 - 17.0 g/dL 15.9 16.6 15.9  Hematocrit 39.0 - 52.0 % 47.6 49.8 47.3  Platelets 150.0 - 400.0 K/uL 380.0  375 344   Lab Results  Component Value Date   MCV 88.7 05/09/2020   MCV 89.6 02/25/2020   MCV 89.8 02/24/2020   Lab Results  Component Value Date   TSH 1.91 05/09/2020     Lipid Panel     Component Value Date/Time   CHOL 268 (H) 02/24/2020 1253   CHOL 232 (H) 11/20/2016 0953   TRIG 212 (H) 02/24/2020 1253   HDL 48 02/24/2020 1253   HDL 50 11/20/2016 0953   CHOLHDL 5.6 02/24/2020 1253   VLDL 42 (H) 02/24/2020 1253   LDLCALC NOT CALCULATED 02/24/2020 1253   LDLCALC 153 (H) 11/20/2016 0953      RADIOLOGY: No results found.  IMPRESSION:  1. Acute ST elevation myocardial infarction (STEMI) of inferior wall ([place]) 06/12/2013 with VF arrest; DES stent to RCA   2. ST elevation myocardial infarction involving right coronary artery ([place]): 02/24/2020 with tandem stents to RCA   3. Essential hypertension   4. Hyperlipidemia with target LDL less than 70   5. Grade II diastolic dysfunction   6. COPD GOLD III     ASSESSMENT AND PLAN: Timothy [name] is a 66 year old white male who suffered an out of hospital cardiac arrest on 06/12/2013 and was found to be in VF and required successful defibrillation and CPR institution prior to arrival to the hospital. I took him acutely to the catheterization laboratory and he was found to have total occlusion of the mid RCA which was successfully intervened upon with restoration of TIMI-3 flow. Initial ejection fraction was 50-55% with mild inferior hypocontractility.  He also has a history of COPD/asthma and had previous significant hyperlipidemia.  When I last saw him in 2018 he was seen for preoperative evaluation prior to undergoing bladder cancer surgery.  A nuclear perfusion study was low risk and his echo Doppler study showed normal LV function with grade 2 diastolic dysfunction.  He apparently had stopped all his medications for some time and presented with an acute inferior STEMI in October 2021 secondary to total occlusion of his RCA.  This was successfully stented with tandem stents LV function was normal.  He is now on DAPT with aspirin/Brilinta and I would recommend long-term therapy.  His blood pressure today is stable.   He is on bisoprolol 2.5 mg and is not having any awareness of palpitations.  He is now on atorvastatin 80 mg.  Lipid studies were increased in October 2021 off therapy with a total cholesterol 268, triglycerides 212;  LDL was not calculated.  He has not had recent laboratory and I recommended he undergo comprehensive metabolic panel, CBC, and lipid studies.  Recent TSH was 1.9 and hemoglobin 15.9.  I stressed the importance of staying on his medications.  I will see him in 6 months for reevaluation or sooner as needed.   Troy Sine, MD, [facility]  07/07/2020 12:04 PM

## 2020-07-07 ENCOUNTER — Encounter: Payer: Self-pay | Admitting: Cardiovascular Disease

## 2020-08-19 ENCOUNTER — Other Ambulatory Visit: Payer: Self-pay | Admitting: Student

## 2020-08-19 DIAGNOSIS — I10 Essential (primary) hypertension: Secondary | ICD-10-CM

## 2020-08-19 DIAGNOSIS — I2510 Atherosclerotic heart disease of native coronary artery without angina pectoris: Secondary | ICD-10-CM

## 2020-08-19 DIAGNOSIS — I25118 Atherosclerotic heart disease of native coronary artery with other forms of angina pectoris: Secondary | ICD-10-CM

## 2020-08-19 NOTE — Telephone Encounter (Signed)
Rx has been sent to the pharmacy electronically. ° °

## 2020-12-18 ENCOUNTER — Ambulatory Visit: Payer: Medicare Other | Admitting: Internal Medicine

## 2020-12-18 ENCOUNTER — Encounter: Payer: Self-pay | Admitting: Internal Medicine

## 2020-12-18 ENCOUNTER — Other Ambulatory Visit: Payer: Self-pay

## 2020-12-18 VITALS — BP 122/68 | HR 66 | Temp 98.1°F | Ht 66.0 in | Wt 126.4 lb

## 2020-12-18 DIAGNOSIS — J449 Chronic obstructive pulmonary disease, unspecified: Secondary | ICD-10-CM

## 2020-12-18 DIAGNOSIS — R0600 Dyspnea, unspecified: Secondary | ICD-10-CM | POA: Diagnosis not present

## 2020-12-18 DIAGNOSIS — R0902 Hypoxemia: Secondary | ICD-10-CM | POA: Diagnosis not present

## 2020-12-18 DIAGNOSIS — R0609 Other forms of dyspnea: Secondary | ICD-10-CM

## 2020-12-18 NOTE — Patient Instructions (Addendum)
Try albuterol 15 min before an activity (on alternating days)  that you know would make you short of breath and see if it makes any difference and if makes none then don't take albuterol after activity unless you can't catch your breath as this means it's the resting that helps, not the albuterol.   We will see if you qualify for 02 today  > goal is to keep above 90% walking   Please schedule a follow up visit in 6  months but call sooner if needed

## 2020-12-18 NOTE — Assessment & Plan Note (Signed)
MM/quit smoking in 2010  - HFA 75% p coaching 09/01/13 > started on symbicort 160 2 bid  - Spirometry 09/15/13  FEV1 1.67 (49%) ratio 44 % - 05/09/2020   breztri 2bid -  05/09/2020   Walked RA  2 laps @ approx 233f each @ avg pace  stopped due sob / sats 93%  - alpha one AT phenotype  MM,  Level 182  - Allergy profile 05/09/20 >  Eos 0.6/  IgE  162  - PFT's  07/01/2020  FEV1 1.35 (46 % ) ratio 0.48 p 6 % improvement from saba p breztri x 2 prior to study with DLCO  9.40 (40%) corrects to 1.97 (45%)  for alv volume and FV curve classic concavity   - 07/01/2020  After extensive coaching inhaler device,  effectiveness =  80%        Group D in terms of symptom/risk and laba/lama/ICS  therefore appropriate rx at this point >>>  Continue breztri plus approp saba  Re saba: I spent extra time with pt today reviewing appropriate use of albuterol for prn use on exertion with the following points: 1) saba is for relief of sob that does not improve by walking a slower pace or resting but rather if the pt does not improve after trying this first. 2) If the pt is convinced, as many are, that saba helps recover from activity faster then it's easy to tell if this is the case by re-challenging : ie stop, take the inhaler, then p 5 minutes try the exact same activity (intensity of workload) that just caused the symptoms and see if they are substantially diminished or not after saba 3) if there is an activity that reproducibly causes the symptoms, try the saba 15 min before the activity on alternate days   If in fact the saba really does help, then fine to continue to use it prn but advised may need to look closer at the maintenance regimen being used to achieve better control of airways disease with exertion.   Each maintenance medication was reviewed in detail including emphasizing most importantly the difference between maintenance and prns and under what circumstances the prns are to be triggered using an action plan  format where appropriate.  Total time for H and P, chart review, counseling, reviewing hfa device(s) , directly observing portions of ambulatory 02 saturation study/ and generating customized AVS unique to this office visit / same day charting = 26 min

## 2020-12-18 NOTE — Progress Notes (Signed)
Timothy [name], male    DOB: [birth year],  MRN: [id number]   Brief patient profile:   72 yowm MM/quit smoking 2010 with GOLD III criteria 2015 maint on symbicort 160 2bid and losing ground with ex tol so referred to pulmonary clinic 05/09/2020 by Dr   Wynetta Emery   Retired [employer]     History of Present Illness  05/09/2020  Pulmonary/ 1st office eval/Timothy [name] symb maint / insurance would not pay spiriva Chief Complaint  Patient presents with   Pulmonary Consult     Referred by Dr Karle Plumber. Pt seen here last in 2015 for COPD. He c/o increased SOB over the past 5 years. He states he gets SOB walking up hill and "not too far".  He is using his albuterol inhaler at 4-6 x per wk on average.    Dyspnea:  Grocery shopping limit is [employer] - can't do [employer] anymore, mailbox is 50 ft and no hill steps at slower pace = MMRC3 = can't walk 100 yards even at a slow pace at a flat grade s stopping due to sob   Cough: am congestion/ white  Sleep: on side two pillows flat bed  SABA use: saba hfa  rec Plan A = Automatic = Always=    Stop symbicort and start Breztri Take 2 puffs first thing in am and then another 2 puffs about 12 hours later.  Work on inhaler technique:  Plan B = Backup (to supplement plan A, not to replace it) Only use your albuterol inhaler as a rescue medication Plan C = Crisis (instead of Plan B but only if Plan B stops working) - only use your albuterol nebulizer if you first try Plan B and it fails to help > ok to use the nebulizer up to every 4 hours but if start needing it regularly call for immediate appointment         06/20/2020  f/u ov/Timothy [name] re: copd  III breztri  2bid > much improved  Chief Complaint  Patient presents with   Follow-up    Breathing is much improved on the [place] and he uses the albuterol inhaler now maybe once per wk.    Dyspnea:  Now MMRC1 = can walk nl pace, flat grade, can't hurry or go uphills or steps s sob   Cough: minimal in am/ better   Sleeping: bed flat/ on side / 2 pillows  SABA use: very little  02: none  Covid status:   Double vax 09/24/19 pfizer  rec No change in medications  I very strongly recommend you get the   pfizer vaccine booster    07/01/2020  f/u ov/Timothy [name] re: COPD III / breztri  Chief Complaint  Patient presents with   Follow-up    SOB  Dyspnea:  MMRC1 = can walk nl pace, flat grade, can't hurry or go uphills or steps s sob  And does desaturate but declines 02  Cough: much better Sleeping: flat bed/ on side on 2 pillows  SABA use: min Rec Make sure you check your oxygen saturation at your highest level of activity to be sure it stays over 88-90% and keep track of it at least once a week, more often if breathing getting worse, and let me know if losing ground. If you find you want to want faster/ farther without  Shortness of breath then we can get you approved for portable 02  Work on inhaler technique   12/18/2020  f/u ov/Timothy [name] re: GOLD III maint  on breztri/ desats with 02  Chief Complaint  Patient presents with   Follow-up    Patient reports doing well and no concerns.   Dyspnea:  struggles to go down to feed dogs and back to the house  Cough: occ in ams/ white mucus Sleeping: ok on side bed is flat SABA use: once every 2 -3 days  02: none  Covid status:   vax x 3    No obvious day to day or daytime variability or assoc  purulent sputum or mucus plugs or hemoptysis or cp or chest tightness, subjective wheeze or overt sinus or hb symptoms.   Sleeping  without nocturnal  or early am exacerbation  of respiratory  c/o's or need for noct saba. Also denies any obvious fluctuation of symptoms with weather or environmental changes or other aggravating or alleviating factors except as outlined above   No unusual exposure hx or h/o childhood pna/ asthma or knowledge of premature birth.  Current Allergies, Complete Past Medical History, Past Surgical History, Family History, and Social History were  reviewed in [employer] record.  ROS  The following are not active complaints unless bolded Hoarseness, sore throat, dysphagia, dental problems, itching, sneezing,  nasal congestion or discharge of excess mucus or purulent secretions, ear ache,   fever, chills, sweats, unintended wt loss or wt gain, classically pleuritic or exertional cp,  orthopnea pnd or arm/hand swelling  or leg swelling, presyncope, palpitations, abdominal pain, anorexia, nausea, vomiting, diarrhea  or change in bowel habits or change in bladder habits, change in stools or change in urine, dysuria, hematuria,  rash, arthralgias, visual complaints, headache, numbness, weakness or ataxia or problems with walking or coordination,  change in mood or  memory.        Current Meds  Medication Sig   acetaminophen (TYLENOL) 500 MG tablet Take 500-1,000 mg by mouth every 8 (eight) hours as needed for mild pain or headache.    albuterol (VENTOLIN HFA) 108 (90 Base) MCG/ACT inhaler Inhale 2 puffs into the lungs every 6 (six) hours as needed for wheezing or shortness of breath.   aspirin EC 81 MG tablet Take 1 tablet (81 mg total) by mouth daily. (Patient taking differently: Take 81 mg by mouth 2 (two) times a week.)   atorvastatin (LIPITOR) 80 MG tablet TAKE 1 TABLET(80 MG) BY MOUTH EVERY EVENING   bisoprolol (ZEBETA) 5 MG tablet TAKE 1/2 TABLET(2.5 MG) BY MOUTH AT BEDTIME   Budeson-Glycopyrrol-Formoterol (BREZTRI AEROSPHERE) 160-9-4.8 MCG/ACT AERO Inhale 2 puffs into the lungs 2 (two) times daily.   metFORMIN (GLUCOPHAGE) 500 MG tablet Take 0.5 tablets (250 mg total) by mouth daily with breakfast.   nitroGLYCERIN (NITROSTAT) 0.4 MG SL tablet Place 1 tablet (0.4 mg total) under the tongue every 5 (five) minutes as needed for chest pain. NEED OV.   ticagrelor (BRILINTA) 90 MG TABS tablet Take 1 tablet (90 mg total) by mouth 2 (two) times daily.                  Past Medical History:  Diagnosis Date   Altered  mental status, improved at discharge 06/19/2013   Bronchitis, chronic ([place]) 06/19/2013   CAD (coronary artery disease)    a. s/p cardiac arrest in 06/2013 with DES to RCA b. 02/2020: Inferior STEMI and cath showing total occlusion of mid-RCA treated with DES placement.    Cancer [facility])    bladder   Cardiac arrest, hypothermic protocol 06/12/2013   COPD exacerbation ([place])  06/12/2013   GERD (gastroesophageal reflux disease)    Hyperlipidemia LDL goal < 70 06/19/2013   Hypokalemia, replaced 06/19/2013   Hypomagnesemia, replaced 06/19/2013   Hypotension, requiring levophed 06/19/2013   NSTEMI (non-ST elevated myocardial infarction), DES to RCA 06/13/2013   Pre-diabetes    Tobacco abuse 06/19/2013        Objective:    12/18/2020      126  07/01/2020       132   06/20/20 133 lb (60.3 kg)  05/09/20 131 lb (59.4 kg)  03/07/20 138 lb (62.6 kg)      Vital signs reviewed  12/18/2020  - Note at rest 02 sats  94% on RA   General appearance:    thin amb wm nad > stated age in appearance  HEENT : pt wearing mask not removed for exam due to covid -19 concerns.    NECK :  without JVD/Nodes/TM/ nl carotid upstrokes bilaterally   LUNGS: no acc muscle use,  Mod barrel  contour chest wall with bilateral  Distant bs s audible wheeze and  without cough on insp or exp maneuvers and mod  Hyperresonant  to  percussion bilaterally     CV:  RRR  no s3 or murmur or increase in P2, and no edema   ABD:  soft and nontender with pos mid insp Hoover's  in the supine position. No bruits or organomegaly appreciated, bowel sounds nl  MS:     ext warm without deformities, calf tenderness, cyanosis or clubbing No obvious joint restrictions   SKIN: warm and dry without lesions    NEURO:  alert, approp, nl sensorium with  no motor or cerebellar deficits apparent.                       Assessment

## 2020-12-18 NOTE — Assessment & Plan Note (Signed)
Reported 07/01/2020  - 12/18/2020   patient walked at a slow pace x 3 laps @ 250 ft each  and oxygen dropped to 87 % during 2lap and oxygen was placed and patient come back up to 93% on 2 liters> ordered .  Advised: Make sure you check your oxygen saturation  at your highest level of activity  to be sure it stays over 90% and adjust  02 flow upward to maintain this level if needed but remember to turn it back to previous settings when you stop (to conserve your supply).

## 2020-12-29 ENCOUNTER — Telehealth: Payer: Self-pay

## 2020-12-29 NOTE — Telephone Encounter (Signed)
LVM for pt to call office to schedule AWV.   Schedule nurse visit 60 min appt 01/11/21 8-12, 01/12/21 1-4  AWV-Zakiah Gauthreaux

## 2021-04-02 ENCOUNTER — Encounter (HOSPITAL_COMMUNITY): Payer: Self-pay | Admitting: Emergency Medicine

## 2021-04-02 ENCOUNTER — Other Ambulatory Visit: Payer: Self-pay

## 2021-04-02 ENCOUNTER — Emergency Department (HOSPITAL_COMMUNITY): Payer: Medicare Other

## 2021-04-02 ENCOUNTER — Inpatient Hospital Stay (HOSPITAL_COMMUNITY)
Admission: EM | Admit: 2021-04-02 | Discharge: 2021-04-09 | DRG: 190 | Disposition: A | Discharge status: Home / Self Care | Payer: Medicare Other | Attending: Family Medicine | Admitting: Family Medicine

## 2021-04-02 DIAGNOSIS — Z87891 Personal history of nicotine dependence: Secondary | ICD-10-CM | POA: Diagnosis not present

## 2021-04-02 DIAGNOSIS — J101 Influenza due to other identified influenza virus with other respiratory manifestations: Secondary | ICD-10-CM | POA: Diagnosis not present

## 2021-04-02 DIAGNOSIS — Z888 Allergy status to other drugs, medicaments and biological substances status: Secondary | ICD-10-CM | POA: Diagnosis not present

## 2021-04-02 DIAGNOSIS — E119 Type 2 diabetes mellitus without complications: Secondary | ICD-10-CM | POA: Diagnosis present

## 2021-04-02 DIAGNOSIS — Z79899 Other long term (current) drug therapy: Secondary | ICD-10-CM

## 2021-04-02 DIAGNOSIS — Z955 Presence of coronary angioplasty implant and graft: Secondary | ICD-10-CM | POA: Diagnosis not present

## 2021-04-02 DIAGNOSIS — Z20822 Contact with and (suspected) exposure to covid-19: Secondary | ICD-10-CM | POA: Diagnosis not present

## 2021-04-02 DIAGNOSIS — Z7901 Long term (current) use of anticoagulants: Secondary | ICD-10-CM | POA: Diagnosis not present

## 2021-04-02 DIAGNOSIS — J9600 Acute respiratory failure, unspecified whether with hypoxia or hypercapnia: Secondary | ICD-10-CM | POA: Diagnosis not present

## 2021-04-02 DIAGNOSIS — Z7982 Long term (current) use of aspirin: Secondary | ICD-10-CM

## 2021-04-02 DIAGNOSIS — J9621 Acute and chronic respiratory failure with hypoxia: Secondary | ICD-10-CM | POA: Diagnosis present

## 2021-04-02 DIAGNOSIS — E785 Hyperlipidemia, unspecified: Secondary | ICD-10-CM | POA: Diagnosis not present

## 2021-04-02 DIAGNOSIS — J441 Chronic obstructive pulmonary disease with (acute) exacerbation: Secondary | ICD-10-CM | POA: Diagnosis not present

## 2021-04-02 DIAGNOSIS — Z8249 Family history of ischemic heart disease and other diseases of the circulatory system: Secondary | ICD-10-CM | POA: Diagnosis not present

## 2021-04-02 DIAGNOSIS — I10 Essential (primary) hypertension: Secondary | ICD-10-CM | POA: Diagnosis not present

## 2021-04-02 DIAGNOSIS — J449 Chronic obstructive pulmonary disease, unspecified: Secondary | ICD-10-CM | POA: Diagnosis not present

## 2021-04-02 DIAGNOSIS — Z7984 Long term (current) use of oral hypoglycemic drugs: Secondary | ICD-10-CM | POA: Diagnosis not present

## 2021-04-02 DIAGNOSIS — Z886 Allergy status to analgesic agent status: Secondary | ICD-10-CM | POA: Diagnosis not present

## 2021-04-02 DIAGNOSIS — R7303 Prediabetes: Secondary | ICD-10-CM | POA: Diagnosis present

## 2021-04-02 DIAGNOSIS — I2510 Atherosclerotic heart disease of native coronary artery without angina pectoris: Secondary | ICD-10-CM | POA: Diagnosis not present

## 2021-04-02 DIAGNOSIS — R0902 Hypoxemia: Secondary | ICD-10-CM | POA: Diagnosis not present

## 2021-04-02 DIAGNOSIS — R0602 Shortness of breath: Secondary | ICD-10-CM | POA: Diagnosis not present

## 2021-04-02 DIAGNOSIS — R062 Wheezing: Secondary | ICD-10-CM | POA: Diagnosis not present

## 2021-04-02 DIAGNOSIS — J439 Emphysema, unspecified: Secondary | ICD-10-CM

## 2021-04-02 DIAGNOSIS — I252 Old myocardial infarction: Secondary | ICD-10-CM | POA: Diagnosis not present

## 2021-04-02 DIAGNOSIS — Z8679 Personal history of other diseases of the circulatory system: Secondary | ICD-10-CM | POA: Diagnosis not present

## 2021-04-02 DIAGNOSIS — Z8674 Personal history of sudden cardiac arrest: Secondary | ICD-10-CM

## 2021-04-02 DIAGNOSIS — J09X2 Influenza due to identified novel influenza A virus with other respiratory manifestations: Secondary | ICD-10-CM | POA: Diagnosis not present

## 2021-04-02 DIAGNOSIS — R4702 Dysphasia: Secondary | ICD-10-CM | POA: Diagnosis not present

## 2021-04-02 DIAGNOSIS — R001 Bradycardia, unspecified: Secondary | ICD-10-CM | POA: Diagnosis not present

## 2021-04-02 DIAGNOSIS — Z9981 Dependence on supplemental oxygen: Secondary | ICD-10-CM | POA: Diagnosis not present

## 2021-04-02 LAB — PROTIME-INR
INR: 1 (ref 0.8–1.2)
Prothrombin Time: 13 seconds (ref 11.4–15.2)

## 2021-04-02 LAB — I-STAT VENOUS BLOOD GAS, ED
Acid-Base Excess: 0 mmol/L (ref 0.0–2.0)
Bicarbonate: 25.8 mmol/L (ref 20.0–28.0)
Calcium, Ion: 1.19 mmol/L (ref 1.15–1.40)
HCT: 51 % (ref 39.0–52.0)
Hemoglobin: 17.3 g/dL — ABNORMAL HIGH (ref 13.0–17.0)
O2 Saturation: 97 %
Potassium: 4 mmol/L (ref 3.5–5.1)
Sodium: 139 mmol/L (ref 135–145)
TCO2: 27 mmol/L (ref 22–32)
pCO2, Ven: 43.8 mmHg — ABNORMAL LOW (ref 44.0–60.0)
pH, Ven: 7.377 (ref 7.250–7.430)
pO2, Ven: 90 mmHg — ABNORMAL HIGH (ref 32.0–45.0)

## 2021-04-02 LAB — CBC WITH DIFFERENTIAL/PLATELET
Abs Immature Granulocytes: 0.31 10*3/uL — ABNORMAL HIGH (ref 0.00–0.07)
Basophils Absolute: 0.1 10*3/uL (ref 0.0–0.1)
Basophils Relative: 0 %
Eosinophils Absolute: 0.2 10*3/uL (ref 0.0–0.5)
Eosinophils Relative: 1 %
HCT: 51.4 % (ref 39.0–52.0)
Hemoglobin: 17.1 g/dL — ABNORMAL HIGH (ref 13.0–17.0)
Immature Granulocytes: 2 %
Lymphocytes Relative: 6 %
Lymphs Abs: 1.2 10*3/uL (ref 0.7–4.0)
MCH: 31.2 pg (ref 26.0–34.0)
MCHC: 33.3 g/dL (ref 30.0–36.0)
MCV: 93.8 fL (ref 80.0–100.0)
Monocytes Absolute: 1.8 10*3/uL — ABNORMAL HIGH (ref 0.1–1.0)
Monocytes Relative: 9 %
Neutro Abs: 16.5 10*3/uL — ABNORMAL HIGH (ref 1.7–7.7)
Neutrophils Relative %: 82 %
Platelets: 328 10*3/uL (ref 150–400)
RBC: 5.48 MIL/uL (ref 4.22–5.81)
RDW: 13.4 % (ref 11.5–15.5)
WBC: 20.1 10*3/uL — ABNORMAL HIGH (ref 4.0–10.5)
nRBC: 0 % (ref 0.0–0.2)

## 2021-04-02 LAB — RESP PANEL BY RT-PCR (FLU A&B, COVID) ARPGX2
Influenza A by PCR: POSITIVE — AB
Influenza B by PCR: NEGATIVE
SARS Coronavirus 2 by RT PCR: NEGATIVE

## 2021-04-02 LAB — APTT: aPTT: 30 seconds (ref 24–36)

## 2021-04-02 LAB — BASIC METABOLIC PANEL
Anion gap: 9 (ref 5–15)
BUN: 13 mg/dL (ref 8–23)
CO2: 25 mmol/L (ref 22–32)
Calcium: 9.3 mg/dL (ref 8.9–10.3)
Chloride: 102 mmol/L (ref 98–111)
Creatinine, Ser: 0.96 mg/dL (ref 0.61–1.24)
GFR, Estimated: >60 mL/min (ref >60)
Glucose, Bld: 104 mg/dL — ABNORMAL HIGH (ref 70–99)
Potassium: 3.9 mmol/L (ref 3.5–5.1)
Sodium: 136 mmol/L (ref 135–145)

## 2021-04-02 LAB — CBG MONITORING, ED: Glucose-Capillary: 118 mg/dL — ABNORMAL HIGH (ref 70–99)

## 2021-04-02 LAB — BRAIN NATRIURETIC PEPTIDE: B Natriuretic Peptide: 59.1 pg/mL (ref 0.0–100.0)

## 2021-04-02 LAB — LACTIC ACID, PLASMA
Lactic Acid, Venous: 4.1 mmol/L (ref 0.5–1.9)
Lactic Acid, Venous: 3.6 mmol/L (ref 0.5–1.9)

## 2021-04-02 LAB — HEMOGLOBIN A1C
Hgb A1c MFr Bld: 5.9 % — ABNORMAL HIGH (ref 4.8–5.6)
Mean Plasma Glucose: 122.63 mg/dL

## 2021-04-02 MED ORDER — UMECLIDINIUM BROMIDE 62.5 MCG/ACT IN AEPB: 1.0000 | INHALATION_SPRAY | Freq: Every day | RESPIRATORY_TRACT | Status: DC

## 2021-04-02 MED ORDER — SODIUM CHLORIDE 0.9 % IV SOLN
500.0000 mg | Freq: Once | INTRAVENOUS | Status: AC
  Administered 2021-04-02: 500 mg via INTRAVENOUS
  Filled 2021-04-02: qty 500

## 2021-04-02 MED ORDER — SODIUM CHLORIDE 0.9 % IV SOLN
1.0000 g | Freq: Once | INTRAVENOUS | Status: AC
  Administered 2021-04-02: 1 g via INTRAVENOUS
  Filled 2021-04-02: qty 10

## 2021-04-02 MED ORDER — ATORVASTATIN CALCIUM 80 MG PO TABS
80.0000 mg | ORAL_TABLET | Freq: Every day | ORAL | Status: DC
  Administered 2021-04-02 – 2021-04-09 (×8): 80 mg via ORAL
  Filled 2021-04-02 (×3): qty 1
  Filled 2021-04-02: qty 2
  Filled 2021-04-02 (×2): qty 1
  Filled 2021-04-02: qty 2
  Filled 2021-04-02: qty 1

## 2021-04-02 MED ORDER — IOHEXOL 350 MG/ML SOLN
80.0000 mL | Freq: Once | INTRAVENOUS | Status: AC | PRN
  Administered 2021-04-02: 80 mL via INTRAVENOUS

## 2021-04-02 MED ORDER — PREDNISONE 20 MG PO TABS
40.0000 mg | ORAL_TABLET | Freq: Every day | ORAL | Status: DC
  Administered 2021-04-03: 40 mg via ORAL
  Filled 2021-04-02: qty 2

## 2021-04-02 MED ORDER — ALBUTEROL SULFATE (2.5 MG/3ML) 0.083% IN NEBU
2.5000 mg | INHALATION_SOLUTION | Freq: Four times a day (QID) | RESPIRATORY_TRACT | Status: DC
Start: 2021-04-02 — End: 2021-04-03
  Administered 2021-04-02 – 2021-04-03 (×3): 2.5 mg via RESPIRATORY_TRACT
  Filled 2021-04-02 (×3): qty 3

## 2021-04-02 MED ORDER — BUDESON-GLYCOPYRROL-FORMOTEROL 160-9-4.8 MCG/ACT IN AERO: 2.0000 | INHALATION_SPRAY | Freq: Two times a day (BID) | RESPIRATORY_TRACT | Status: DC

## 2021-04-02 MED ORDER — LEVOFLOXACIN 500 MG PO TABS
750.0000 mg | ORAL_TABLET | Freq: Every day | ORAL | Status: DC
  Administered 2021-04-03 – 2021-04-07 (×5): 750 mg via ORAL
  Filled 2021-04-02 (×6): qty 2

## 2021-04-02 MED ORDER — TICAGRELOR 90 MG PO TABS
90.0000 mg | ORAL_TABLET | Freq: Two times a day (BID) | ORAL | Status: DC
  Administered 2021-04-02 – 2021-04-09 (×14): 90 mg via ORAL
  Filled 2021-04-02 (×15): qty 1

## 2021-04-02 MED ORDER — FLUTICASONE FUROATE-VILANTEROL 200-25 MCG/ACT IN AEPB
1.0000 | INHALATION_SPRAY | Freq: Every day | RESPIRATORY_TRACT | Status: DC
  Filled 2021-04-02: qty 28

## 2021-04-02 MED ORDER — UMECLIDINIUM BROMIDE 62.5 MCG/ACT IN AEPB
1.0000 | INHALATION_SPRAY | Freq: Every day | RESPIRATORY_TRACT | Status: DC
  Filled 2021-04-02: qty 7

## 2021-04-02 MED ORDER — LACTATED RINGERS IV BOLUS
1000.0000 mL | Freq: Once | INTRAVENOUS | Status: AC
  Administered 2021-04-02: 1000 mL via INTRAVENOUS

## 2021-04-02 MED ORDER — INSULIN ASPART 100 UNIT/ML IJ SOLN
0.0000 [IU] | Freq: Three times a day (TID) | INTRAMUSCULAR | Status: DC
  Administered 2021-04-03 – 2021-04-05 (×3): 2 [IU] via SUBCUTANEOUS
  Administered 2021-04-05: 3 [IU] via SUBCUTANEOUS
  Administered 2021-04-05 – 2021-04-06 (×3): 2 [IU] via SUBCUTANEOUS
  Administered 2021-04-07: 3 [IU] via SUBCUTANEOUS
  Administered 2021-04-08 – 2021-04-09 (×3): 2 [IU] via SUBCUTANEOUS

## 2021-04-02 MED ORDER — IPRATROPIUM BROMIDE 0.02 % IN SOLN
0.5000 mg | Freq: Once | RESPIRATORY_TRACT | Status: AC
  Administered 2021-04-02: 0.5 mg via RESPIRATORY_TRACT
  Filled 2021-04-02: qty 2.5

## 2021-04-02 MED ORDER — METHYLPREDNISOLONE SODIUM SUCC 125 MG IJ SOLR: 125.0000 mg | Freq: Once | INTRAMUSCULAR | Status: DC

## 2021-04-02 MED ORDER — BISOPROLOL FUMARATE 5 MG PO TABS
2.5000 mg | ORAL_TABLET | Freq: Every day | ORAL | Status: DC
  Administered 2021-04-02 – 2021-04-08 (×7): 2.5 mg via ORAL
  Filled 2021-04-02 (×8): qty 0.5

## 2021-04-02 MED ORDER — AZITHROMYCIN 250 MG PO TABS: 250.0000 mg | ORAL_TABLET | Freq: Every day | ORAL | Status: DC

## 2021-04-02 MED ORDER — ALBUTEROL SULFATE (2.5 MG/3ML) 0.083% IN NEBU
5.0000 mg | INHALATION_SOLUTION | Freq: Once | RESPIRATORY_TRACT | Status: AC
  Administered 2021-04-02: 5 mg via RESPIRATORY_TRACT
  Filled 2021-04-02: qty 6

## 2021-04-02 MED ORDER — ENOXAPARIN SODIUM 40 MG/0.4ML IJ SOSY
40.0000 mg | PREFILLED_SYRINGE | INTRAMUSCULAR | Status: DC
  Administered 2021-04-02 – 2021-04-08 (×7): 40 mg via SUBCUTANEOUS
  Filled 2021-04-02 (×7): qty 0.4

## 2021-04-02 MED ORDER — ALBUTEROL SULFATE HFA 108 (90 BASE) MCG/ACT IN AERS
2.0000 | INHALATION_SPRAY | Freq: Four times a day (QID) | RESPIRATORY_TRACT | Status: DC | PRN
  Filled 2021-04-02: qty 6.7

## 2021-04-02 MED ORDER — AZITHROMYCIN 250 MG PO TABS: 500.0000 mg | ORAL_TABLET | Freq: Every day | ORAL | Status: DC

## 2021-04-02 MED ORDER — MAGNESIUM SULFATE 2 GM/50ML IV SOLN
2.0000 g | INTRAVENOUS | Status: AC
  Filled 2021-04-02: qty 50

## 2021-04-02 MED ORDER — SODIUM CHLORIDE 0.9 % IV SOLN: 500.0000 mg | INTRAVENOUS | Status: DC

## 2021-04-02 MED ORDER — TIOTROPIUM BROMIDE MONOHYDRATE 18 MCG IN CAPS: 18.0000 ug | ORAL_CAPSULE | Freq: Every day | RESPIRATORY_TRACT | Status: DC

## 2021-04-02 MED ORDER — ASPIRIN EC 81 MG PO TBEC
81.0000 mg | DELAYED_RELEASE_TABLET | ORAL | Status: DC
  Administered 2021-04-03 – 2021-04-07 (×2): 81 mg via ORAL
  Filled 2021-04-02 (×2): qty 1

## 2021-04-02 NOTE — ED Notes (Signed)
Pt reports he feels better after after the breathing treatment.

## 2021-04-02 NOTE — ED Provider Notes (Signed)
[facility] EMERGENCY DEPARTMENT Provider Note   CSN: [id number] Arrival date & time: 04/02/21  0056     History Chief Complaint  Patient presents with   Shortness of Breath    Timothy [name] is a 65 y.o. male with history of COPD presents emergency department with worsening shortness of breath at rest and on exertion over the past 2 to 3 days.  Patient reports he been compliant with his inhalers but is still not had any relief.  He has been having to use his Ventolin more than normal.  He denies any fevers, chills, chest pains, abdominal pain, nausea, vomiting, or leg swelling.  He is on as needed oxygen at home but has not been using it.  Medical history includes COPD.  He denies any CHF.  Denies any steroid use.  Patient is a former smoker.   Shortness of Breath Associated symptoms: cough   Associated symptoms: no abdominal pain, no chest pain, no ear pain, no fever, no rash, no sore throat and no vomiting       Past Medical History:  Diagnosis Date   Altered mental status, improved at discharge 06/19/2013   Bronchitis, chronic ([place]) 06/19/2013   CAD (coronary artery disease)    a. s/p cardiac arrest in 06/2013 with DES to RCA b. 02/2020: Inferior STEMI and cath showing total occlusion of mid-RCA treated with DES placement.    Cancer ([place])    bladder   Cardiac arrest, hypothermic protocol 06/12/2013   COPD exacerbation ([place]) 06/12/2013   GERD (gastroesophageal reflux disease)    Hyperlipidemia LDL goal < 70 06/19/2013   Hypokalemia, replaced 06/19/2013   Hypomagnesemia, replaced 06/19/2013   Hypotension, requiring levophed 06/19/2013   NSTEMI (non-ST elevated myocardial infarction), DES to RCA 06/13/2013   Pre-diabetes    Tobacco abuse 06/19/2013    Patient Active Problem List   Diagnosis Date Noted   Exercise hypoxemia 07/02/2020   Acute STEMI of inferior wall  02/24/2020   Malignant neoplasm of urinary bladder ([place]) 02/23/2017   Lung nodule 11/20/2016    Gastroesophageal reflux disease without esophagitis 11/20/2016   Prediabetes 12/29/2013   Dental cavities 12/29/2013   DOE (dyspnea on exertion) 08/30/2013   Essential hypertension 08/03/2013   CAD S/P percutaneous coronary angioplasty 06/19/2013   Hyperlipidemia with target LDL less than 70 06/19/2013   Former smoker 06/19/2013   NSTEMI (non-ST elevated myocardial infarction), DES to RCA 06/13/2013   VT (ventricular tachycardia), requiring 2 shocks 06/12/2013   COPD GOLD III 06/12/2013    Past Surgical History:  Procedure Laterality Date   cardiac sstent     CORONARY ANGIOPLASTY WITH STENT PLACEMENT  06/12/13   [facility] [place] DES to RCA, NSTEMI   CORONARY STENT INTERVENTION N/A 02/24/2020   Procedure: CORONARY STENT INTERVENTION;  Surgeon: Belva Crome, MD;  Location: [facility];  Service: Cardiovascular;  Laterality: N/A;   CORONARY/GRAFT ACUTE MI REVASCULARIZATION N/A 02/24/2020   Procedure: Coronary/Graft Acute MI Revascularization;  Surgeon: Belva Crome, MD;  Location: [facility];  Service: Cardiovascular;  Laterality: N/A;   CYSTOSCOPY W/ RETROGRADES Left 03/29/2017   Procedure: CYSTOSCOPY WITH LEFT RETROGRADE PYELOGRAM;  Surgeon: Kathie Rhodes, MD;  Location: [facility];  Service: Urology;  Laterality: Left;   LEFT HEART CATH AND CORONARY ANGIOGRAPHY N/A 02/24/2020   Procedure: LEFT HEART CATH AND CORONARY ANGIOGRAPHY;  Surgeon: Belva Crome, MD;  Location: [facility];  Service: Cardiovascular;  Laterality: N/A;   LEFT  HEART CATHETERIZATION WITH CORONARY ANGIOGRAM N/A 06/12/2013   Procedure: LEFT HEART CATHETERIZATION WITH CORONARY ANGIOGRAM;  Surgeon: Troy Sine, MD;  Location: [facility] CATH LAB;  Service: Cardiovascular;  Laterality: N/A;   NO PAST SURGERIES     TRANSURETHRAL RESECTION OF BLADDER TUMOR N/A 03/29/2017   Procedure: TRANSURETHRAL RESECTION OF BLADDER TUMOR (TURBT)/;  Surgeon: Kathie Rhodes, MD;  Location: [facility];  Service: Urology;  Laterality:  N/A;   transurethral ressection of bladder tumor     Dr. Karsten Ro 03/29/17       Family History  Problem Relation Age of Onset   Hypertension Other     Social History   Tobacco Use   Smoking status: Former    Packs/day: 2.00    Years: 38.00    Pack years: 76.00    Types: Cigarettes    Quit date: 07/10/2008    Years since quitting: 12.7   Smokeless tobacco: Never  Vaping Use   Vaping Use: Never used  Substance Use Topics   Alcohol use: No   Drug use: No    Types: Marijuana    Home Medications Prior to Admission medications   Medication Sig Start Date End Date Taking? Authorizing Provider  acetaminophen (TYLENOL) 500 MG tablet Take 500-1,000 mg by mouth every 8 (eight) hours as needed for mild pain or headache.     [provider]  albuterol (VENTOLIN HFA) 108 (90 Base) MCG/ACT inhaler Inhale 2 puffs into the lungs every 6 (six) hours as needed for wheezing or shortness of breath. 02/25/20   Strader, Fransisco Hertz, PA-C  aspirin EC 81 MG tablet Take 1 tablet (81 mg total) by mouth daily. Patient taking differently: Take 81 mg by mouth 2 (two) times a week. 10/21/15   Maren Reamer, MD  atorvastatin (LIPITOR) 80 MG tablet TAKE 1 TABLET(80 MG) BY MOUTH EVERY EVENING 08/19/20   Troy Sine, MD  bisoprolol (ZEBETA) 5 MG tablet TAKE 1/2 TABLET(2.5 MG) BY MOUTH AT BEDTIME 08/19/20   Troy Sine, MD  Budeson-Glycopyrrol-Formoterol (BREZTRI AEROSPHERE) 160-9-4.8 MCG/ACT AERO Inhale 2 puffs into the lungs 2 (two) times daily. 07/01/20   Tanda Rockers, MD  metFORMIN (GLUCOPHAGE) 500 MG tablet Take 0.5 tablets (250 mg total) by mouth daily with breakfast. 03/05/20   Ladell Pier, MD  nitroGLYCERIN (NITROSTAT) 0.4 MG SL tablet Place 1 tablet (0.4 mg total) under the tongue every 5 (five) minutes as needed for chest pain. NEED OV. 02/25/20   Strader, Fransisco Hertz, PA-C  ticagrelor (BRILINTA) 90 MG TABS tablet Take 1 tablet (90 mg total) by mouth 2 (two) times daily.  03/07/20   Erlene Quan, PA-C    Allergies    Ace inhibitors, Aspirin, Ibuprofen, and Losartan  Review of Systems   Review of Systems  Constitutional:  Negative for chills and fever.  HENT:  Negative for ear pain and sore throat.   Eyes:  Negative for pain and visual disturbance.  Respiratory:  Positive for cough and shortness of breath.   Cardiovascular:  Negative for chest pain and palpitations.  Gastrointestinal:  Negative for abdominal pain, nausea and vomiting.  Genitourinary:  Negative for dysuria and hematuria.  Musculoskeletal:  Negative for arthralgias and back pain.  Skin:  Negative for color change and rash.  Neurological:  Negative for seizures, syncope, weakness and light-headedness.  All other systems reviewed and are negative.  Physical Exam Updated Vital Signs BP 109/72   Pulse (!) 104   Temp 98.3 F (36.8  C) (Oral)   Resp 11   SpO2 95%   Physical Exam Vitals and nursing note reviewed.  Constitutional:      Appearance: Normal appearance. He is not toxic-appearing.     Comments: Appears uncomfortable  HENT:     Head: Normocephalic and atraumatic.     Mouth/Throat:     Mouth: Mucous membranes are moist.  Eyes:     General: No scleral icterus. Cardiovascular:     Rate and Rhythm: Regular rhythm. Tachycardia present.     Pulses: Normal pulses.  Pulmonary:     Effort: Tachypnea present. No accessory muscle usage or respiratory distress.     Breath sounds: Normal breath sounds.     Comments: Patient is mildly tripoding, no retractions.  Satting 95% on 4 L.  No nasal flaring or cyanosis.  Patient is able to speak almost complete sentences without needing to take of breath.  On exam, patient has diffuse inspiratory wheezing with diffuse coarse expiratory wheezing, especially at the bases. Abdominal:     Palpations: Abdomen is soft.     Tenderness: There is no abdominal tenderness. There is no guarding or rebound.  Musculoskeletal:        General: No  deformity.     Cervical back: Normal range of motion.     Right lower leg: No edema.     Left lower leg: No edema.  Skin:    General: Skin is warm and dry.  Neurological:     General: No focal deficit present.     Mental Status: He is alert. Mental status is at baseline.    ED Results / Procedures / Treatments   Labs (all labs ordered are listed, but only abnormal results are displayed) Labs Reviewed  CBC WITH DIFFERENTIAL/PLATELET - Abnormal; Notable for the following components:      Result Value   WBC 20.1 (*)    Hemoglobin 17.1 (*)    Neutro Abs 16.5 (*)    Monocytes Absolute 1.8 (*)    Abs Immature Granulocytes 0.31 (*)    All other components within normal limits  BASIC METABOLIC PANEL - Abnormal; Notable for the following components:   Glucose, Bld 104 (*)    All other components within normal limits  I-STAT VENOUS BLOOD GAS, ED - Abnormal; Notable for the following components:   pCO2, Ven 43.8 (*)    pO2, Ven 90.0 (*)    Hemoglobin 17.3 (*)    All other components within normal limits  CULTURE, BLOOD (ROUTINE X 2)  CULTURE, BLOOD (ROUTINE X 2)  URINE CULTURE  RESP PANEL BY RT-PCR (FLU A&B, COVID) ARPGX2  PROTIME-INR  APTT  LACTIC ACID, PLASMA  LACTIC ACID, PLASMA  URINALYSIS, ROUTINE W REFLEX MICROSCOPIC  BRAIN NATRIURETIC PEPTIDE    EKG EKG Interpretation  Date/Time:  Wednesday April 02 2021 01:09:19 EST Ventricular Rate:  116 PR Interval:  148 QRS Duration: 62 QT Interval:  318 QTC Calculation: 442 R Axis:   84 Text Interpretation: Sinus tachycardia with occasional Premature ventricular complexes Abnormal ECG Since last tracing rate faster Otherwise no significant change Confirmed by Addison Lank [phone]) on 04/02/2021 1:17:02 AM  Radiology DG Chest 2 View  Result Date: 04/02/2021 CLINICAL DATA:  Shortness of breath EXAM: CHEST - 2 VIEW COMPARISON:  02/24/2020 FINDINGS: The lungs are hyperinflated with diffuse interstitial prominence. No focal  airspace consolidation or pulmonary edema. No pleural effusion or pneumothorax. Normal cardiomediastinal contours. IMPRESSION: COPD without acute airspace disease. Electronically Signed   By:  Ulyses Jarred M.D.   On: 04/02/2021 02:31    Procedures Procedures   Medications Ordered in ED Medications  magnesium sulfate IVPB 2 g 50 mL (has no administration in time range)  cefTRIAXone (ROCEPHIN) 1 g in sodium chloride 0.9 % 100 mL IVPB (1 g Intravenous New Bag/Given 04/02/21 1548)  azithromycin (ZITHROMAX) 500 mg in sodium chloride 0.9 % 250 mL IVPB (has no administration in time range)  albuterol (PROVENTIL) (2.5 MG/3ML) 0.083% nebulizer solution 5 mg (5 mg Nebulization Given 04/02/21 0118)  ipratropium (ATROVENT) nebulizer solution 0.5 mg (0.5 mg Nebulization Given 04/02/21 0118)  lactated ringers bolus 1,000 mL (1,000 mLs Intravenous New Bag/Given 04/02/21 1545)    ED Course  I have reviewed the triage vital signs and the nursing notes.  Pertinent labs & imaging results that were available during my care of the patient were reviewed by me and considered in my medical decision making (see chart for details).  65 year old male presents the emergency department for evaluation of worsening shortness of breath.  Differential diagnosis includes but is not limited to PE, pneumothorax, pneumonia, viral illness, COVID, flu, COPD exacerbation.  On exam, patient is mildly tripoding and is almost able to speak complete sentences without use take of breath.  Before I was in the room, the nurse mentioned that whenever he was transferring from the wheelchair to the bed, he dropped out into the 80s while on 2 L.  Patient is now on 4 L and satting 95% in the room.  His lungs have diffuse inspiratory wheezing with coarse expiratory wheezing, especially at the bases.  Mildly tachycardic.  Patient is mildly tachypneic at 24.  No evidence of respiratory distress.  No retractions, cyanosis, or nasal flaring.  The  patient is uncomfortable, but not toxic appearing.  No leg edema appreciated.  DuoNeb ordered.  I personally reviewed and interpreted the patient's labs and imaging.  BMP shows no electrolyte or maladies.  Mildly elevated glucose at 104.  CBC shows elevated white blood cell count at 20 with a left shift.  No anemia, patient has slightly elevated hemoglobin.  Mostly because due to his COPD and low oxygen.  VBG shows elevated pO2, slightly below normal pCO2.  X-ray shows COPD exacerbation without acute airspace disease.  CT chest ordered for rule out pneumonia and PE, although low suspicion for PE.  EKG shows sinus tachycardia with occasional PVC.  These labs were ordered by [facility] provider.  In the white blood cell count, tachypnea, and tachycardia, sepsis order set was placed.  CAP antibiotics with Rocephin and azithromycin placed.  On reevaluation, the patient is still satting at 95%, but is normal or tachypneic.  He reports he is feeling more comfortable, but still mildly short of breath.  Based on the new oxygen requirement, elevated white blood cell count, and rule out sepsis, patient will be admitted. [facility] hospitalist to admit.   I discussed this case with my attending physician who cosigned this note including patient's presenting symptoms, physical exam, and planned diagnostics and interventions. Attending physician stated agreement with plan or made changes to plan which were implemented.     MDM Rules/Calculators/A&P                          Final Clinical Impression(s) / ED Diagnoses Final diagnoses:  COPD exacerbation ([place])  Hypoxia    Rx / DC Orders ED Discharge Orders     None  Sherrell Puller, PA-C 04/02/21 1622    Elnora Morrison, MD 04/03/21 [phone]

## 2021-04-02 NOTE — ED Triage Notes (Addendum)
Per EMS, pt has hx of CAD and COPD, became SOB X a couple of days.  He does have home O2 but only uses it "as needed".  Pt was 97% upon EMS arrival.     20G L AC 125 solumedrol 144/84 112 pulse 18RR 97.7 temp 122 CBG

## 2021-04-02 NOTE — H&P (Signed)
History and Physical    Timothy [name] GYK:[id number] DOB: Sep 26, 1955 DOA: 04/02/2021  PCP: Ladell Pier, MD (Confirm with patient/family/NH records and if not entered, this has to be entered at [facility] point of entry) Patient coming from: Home  I have personally briefly reviewed patient's old medical records in [place]  Chief Complaint: Cough, wheezing, SOB  HPI: Timothy [name] is a 65 y.o. male with medical history significant of COPD Gold stage III on as needed home oxygen, HTN, HLD, CAD with stenting in 2021, IIDM, presented with worsening of cough, wheezing, shortness of breath.  Symptoms started about 3 to 4 days ago, gradually getting worse, minimum activity can trigger significant cough and shortness of breath.  Cough has been productive with dark color, denies any chest pain, no fever or chills.  He has been using oxygen around-the-clock for last 3 days with minimum help.  At baseline, he is exercise tolerance is poor, can only tolerate 5 to 10 minutes walk then started to feel shortness of breath.  ED Course: Tachypneic and tachycardia, minimum walk O2 saturation dropped to 88% and became tachypnea and tachycardia.  CT angiogram negative for PE.  Blood work WBC 20.1, Hb 17.1  Review of Systems: As per HPI otherwise 14 point review of systems negative.    Past Medical History:  Diagnosis Date   Altered mental status, improved at discharge 06/19/2013   Bronchitis, chronic ([place]) 06/19/2013   CAD (coronary artery disease)    a. s/p cardiac arrest in 06/2013 with DES to RCA b. 02/2020: Inferior STEMI and cath showing total occlusion of mid-RCA treated with DES placement.    Cancer [facility])    bladder   Cardiac arrest, hypothermic protocol 06/12/2013   COPD exacerbation ([place]) 06/12/2013   GERD (gastroesophageal reflux disease)    Hyperlipidemia LDL goal < 70 06/19/2013   Hypokalemia, replaced 06/19/2013   Hypomagnesemia, replaced 06/19/2013   Hypotension, requiring levophed  06/19/2013   NSTEMI (non-ST elevated myocardial infarction), DES to RCA 06/13/2013   Pre-diabetes    Tobacco abuse 06/19/2013    Past Surgical History:  Procedure Laterality Date   cardiac sstent     CORONARY ANGIOPLASTY WITH STENT PLACEMENT  06/12/13   [employer] [place] DES to RCA, NSTEMI   CORONARY STENT INTERVENTION N/A 02/24/2020   Procedure: CORONARY STENT INTERVENTION;  Surgeon: Belva Crome, MD;  Location: [place] CV LAB;  Service: Cardiovascular;  Laterality: N/A;   CORONARY/GRAFT ACUTE MI REVASCULARIZATION N/A 02/24/2020   Procedure: Coronary/Graft Acute MI Revascularization;  Surgeon: Belva Crome, MD;  Location: [place] CV LAB;  Service: Cardiovascular;  Laterality: N/A;   CYSTOSCOPY W/ RETROGRADES Left 03/29/2017   Procedure: CYSTOSCOPY WITH LEFT RETROGRADE PYELOGRAM;  Surgeon: Kathie [place], MD;  Location: [facility];  Service: Urology;  Laterality: Left;   LEFT HEART CATH AND CORONARY ANGIOGRAPHY N/A 02/24/2020   Procedure: LEFT HEART CATH AND CORONARY ANGIOGRAPHY;  Surgeon: Belva Crome, MD;  Location: [place] CV LAB;  Service: Cardiovascular;  Laterality: N/A;   LEFT HEART CATHETERIZATION WITH CORONARY ANGIOGRAM N/A 06/12/2013   Procedure: LEFT HEART CATHETERIZATION WITH CORONARY ANGIOGRAM;  Surgeon: Troy Sine, MD;  Location[facility] C[facility];  Service: Cardiovascular;  Laterality: N/A;   NO PAST SURGERIES     TRANSURETHRAL RESECTION OF BLADDER TUMOR N/A 03/29/2017   Procedure: TRANSURETHRAL RESECTION OF BLADDER TUMOR (TURBT)/;  Surgeon: Kathie [place], MD;  Location: [facility];  Service: Urology;  Laterality: N/A;   transurethral ressection  of bladder tumor     Dr. Karsten Ro 03/29/17     reports that he quit smoking about 12 years ago. His smoking use included cigarettes. He has a 76.00 pack-year smoking history. He has never used smokeless tobacco. He reports that he does not drink alcohol and does not use drugs.  Allergies  Allergen Reactions   Ace Inhibitors Other  (See Comments)    Caused wheezing   Aspirin Swelling and Other (See Comments)    Lips swell- still takes approx 2 times a week in 2021   Ibuprofen Swelling and Other (See Comments)    Lips swell   Losartan Other (See Comments)    Leg cramps    Family History  Problem Relation Age of Onset   Hypertension Other      Prior to Admission medications   Medication Sig Start Date End Date Taking? Authorizing Provider  acetaminophen (TYLENOL) 500 MG tablet Take 500-1,000 mg by mouth every 8 (eight) hours as needed for mild pain or headache.     [provider]  albuterol (VENTOLIN HFA) 108 (90 Base) MCG/ACT inhaler Inhale 2 puffs into the lungs every 6 (six) hours as needed for wheezing or shortness of breath. 02/25/20   Strader, Fransisco Hertz, PA-C  aspirin EC 81 MG tablet Take 1 tablet (81 mg total) by mouth daily. Patient taking differently: Take 81 mg by mouth 2 (two) times a week. 10/21/15   Maren Reamer, MD  atorvastatin (LIPITOR) 80 MG tablet TAKE 1 TABLET(80 MG) BY MOUTH EVERY EVENING 08/19/20   Troy Sine, MD  bisoprolol (ZEBETA) 5 MG tablet TAKE 1/2 TABLET(2.5 MG) BY MOUTH AT BEDTIME 08/19/20   Troy Sine, MD  Budeson-Glycopyrrol-Formoterol (BREZTRI AEROSPHERE) 160-9-4.8 MCG/ACT AERO Inhale 2 puffs into the lungs 2 (two) times daily. 07/01/20   Tanda Rockers, MD  metFORMIN (GLUCOPHAGE) 500 MG tablet Take 0.5 tablets (250 mg total) by mouth daily with breakfast. 03/05/20   Ladell Pier, MD  nitroGLYCERIN (NITROSTAT) 0.4 MG SL tablet Place 1 tablet (0.4 mg total) under the tongue every 5 (five) minutes as needed for chest pain. NEED OV. 02/25/20   Strader, Fransisco Hertz, PA-C  ticagrelor (BRILINTA) 90 MG TABS tablet Take 1 tablet (90 mg total) by mouth 2 (two) times daily. 03/07/20   Erlene Quan, PA-C    Physical Exam: Vitals:   04/02/21 1048 04/02/21 1410 04/02/21 1500 04/02/21 1530  BP: 122/73  109/72 118/69  Pulse: (!) 103 (!) 107 (!) 104 (!) 115  Resp: (!)  24 (!) 25 11 12   Temp:  98.3 F (36.8 C)    TempSrc:  Oral    SpO2: 90%  95% 94%    Constitutional: NAD, calm, comfortable Vitals:   04/02/21 1048 04/02/21 1410 04/02/21 1500 04/02/21 1530  BP: 122/73  109/72 118/69  Pulse: (!) 103 (!) 107 (!) 104 (!) 115  Resp: (!) 24 (!) 25 11 12   Temp:  98.3 F (36.8 C)    TempSrc:  Oral    SpO2: 90%  95% 94%   Eyes: PERRL, lids and conjunctivae normal ENMT: Mucous membranes are moist. Posterior pharynx clear of any exudate or lesions.Normal dentition.  Neck: normal, supple, no masses, no thyromegaly Respiratory: Diminished breathing sound bilaterally, diffused wheezing, no crackles.  Increasing respiratory effort. No accessory muscle use.  Cardiovascular: Regular rate and rhythm, no murmurs / rubs / gallops. No extremity edema. 2+ pedal pulses. No carotid bruits.  Abdomen: no tenderness, no masses  palpated. No hepatosplenomegaly. Bowel sounds positive.  Musculoskeletal: no clubbing / cyanosis. No joint deformity upper and lower extremities. Good ROM, no contractures. Normal muscle tone.  Skin: no rashes, lesions, ulcers. No induration Neurologic: CN 2-12 grossly intact. Sensation intact, DTR normal. Strength 5/5 in all 4.  Psychiatric: Normal judgment and insight. Alert and oriented x 3. Normal mood.     Labs on Admission: I have personally reviewed following labs and imaging studies  CBC: Recent Labs  Lab 04/02/21 0125 04/02/21 0202  WBC 20.1*  --   NEUTROABS 16.5*  --   HGB 17.1* 17.3*  HCT 51.4 51.0  MCV 93.8  --   PLT 328  --    Basic Metabolic Panel: Recent Labs  Lab 04/02/21 0125 04/02/21 0202  NA 136 139  K 3.9 4.0  CL 102  --   CO2 25  --   GLUCOSE 104*  --   BUN 13  --   CREATININE 0.96  --   CALCIUM 9.3  --    GFR: CrCl cannot be calculated (Unknown ideal weight.). Liver Function Tests: No results for input(s): AST, ALT, ALKPHOS, BILITOT, PROT, ALBUMIN in the last 168 hours. No results for input(s): LIPASE,  AMYLASE in the last 168 hours. No results for input(s): AMMONIA in the last 168 hours. Coagulation Profile: Recent Labs  Lab 04/02/21 1342  INR 1.0   Cardiac Enzymes: No results for input(s): CKTOTAL, CKMB, CKMBINDEX, TROPONINI in the last 168 hours. BNP (last 3 results) Recent Labs    05/09/20 1648  PROBNP 29.0   HbA1C: No results for input(s): HGBA1C in the last 72 hours. CBG: No results for input(s): GLUCAP in the last 168 hours. Lipid Profile: No results for input(s): CHOL, HDL, LDLCALC, TRIG, CHOLHDL, LDLDIRECT in the last 72 hours. Thyroid Function Tests: No results for input(s): TSH, T4TOTAL, FREET4, T3FREE, THYROIDAB in the last 72 hours. Anemia Panel: No results for input(s): VITAMINB12, FOLATE, FERRITIN, TIBC, IRON, RETICCTPCT in the last 72 hours. Urine analysis:    Component Value Date/Time   COLORURINE YELLOW 04/22/2017 2119   APPEARANCEUR HAZY (A) 04/22/2017 2119   LABSPEC 1.015 04/22/2017 2119   PHURINE 8.0 04/22/2017 2119   GLUCOSEU NEGATIVE 04/22/2017 2119   HGBUR SMALL (A) 04/22/2017 2119   BILIRUBINUR NEGATIVE 04/22/2017 2119   [place] 04/22/2017 2119   PROTEINUR 30 (A) 04/22/2017 2119   UROBILINOGEN 0.2 06/12/2013 0415   NITRITE POSITIVE (A) 04/22/2017 2119   LEUKOCYTESUR LARGE (A) 04/22/2017 2119    Radiological Exams on Admission: DG Chest 2 View  Result Date: 04/02/2021 CLINICAL DATA:  Shortness of breath EXAM: CHEST - 2 VIEW COMPARISON:  02/24/2020 FINDINGS: The lungs are hyperinflated with diffuse interstitial prominence. No focal airspace consolidation or pulmonary edema. No pleural effusion or pneumothorax. Normal cardiomediastinal contours. IMPRESSION: COPD without acute airspace disease. Electronically Signed   By: Ulyses Jarred M.D.   On: 04/02/2021 02:31   CT Angio Chest PE W/Cm &/Or Wo Cm  Result Date: 04/02/2021 CLINICAL DATA:  Pulmonary emboli suspected, high probability. Shortness of breath over the last several days.  EXAM: CT ANGIOGRAPHY CHEST WITH CONTRAST TECHNIQUE: Multidetector CT imaging of the chest was performed using the standard protocol during bolus administration of intravenous contrast. Multiplanar CT image reconstructions and MIPs were obtained to evaluate the vascular anatomy. CONTRAST:  30mL OMNIPAQUE IOHEXOL 350 MG/ML SOLN COMPARISON:  Chest radiography same day.  CT 11/25/2016 FINDINGS: Cardiovascular: Heart size is normal. There is coronary artery calcification and/or stents.  There is aortic atherosclerotic calcification. Pulmonary arterial opacification is excellent. There are no pulmonary emboli. Mediastinum/Nodes: No mediastinal or hilar mass or lymphadenopathy. Lungs/Pleura: Advanced pulmonary emphysema, upper lung predominant. There are a few mucous filled bronchi in both lower lobes but there is no parenchymal consolidation, collapse or effusion. There is no pulmonary mass or nodule in need of follow-up. 3 mm subpleural nodule in the right lower lobe is unchanged and benign. Upper Abdomen: Normal Musculoskeletal: Normal Review of the MIP images confirms the above findings. IMPRESSION: No pulmonary emboli. Coronary artery calcification and/or stents. Aortic Atherosclerosis (ICD10-I70.0) and Emphysema (ICD10-J43.9). The emphysema is quite advanced. No pulmonary infiltrate or collapse. There are a few mucous filled bronchi in the lower lobes Electronically Signed   By: Nelson Chimes M.D.   On: 04/02/2021 16:24    EKG: Independently reviewed. Sinus, no acute ST changes  Assessment/Plan Principal Problem:   COPD (chronic obstructive pulmonary disease) (HCC) Active Problems:   COPD GOLD III  (please populate well all problems here in Problem List. (For example, if patient is on BP meds at home and you resume or decide to hold them, it is a problem that needs to be her. Same for CAD, COPD, HLD and so on)  Acute COPD exacerbation -Received ceftriaxone and azithromycin in the ED, switch to Levaquin x4  days. -Continue breathing treatment albuterol, LABA, short course of p.o. steroid -Add Spiriva  Acute on chronic hypoxic respite failure -Treat COPD exacerbation, wean down oxygen.  Leukocytosis -No significant infiltrates on x-ray, CT angiogram showed signs of bronchitis of right lower lung, likely from COPD exacerbation, management as above. -Might be hemoconcentrated as well as hemoglobin= 17, recheck CBC in AM.  HTN/CAD/HLD -No acute issue -Continue beta-blocker, and Brilinta and statin  IIDM -Hold metformin for 72 hours as patient received IV contrast. -Sliding scale for now  DVT prophylaxis: Lovenox Code Status: Full code Family Communication: None at bedside Disposition Plan: Expect less than 2 midnight hospital stay Consults called: None Admission status: MedSurg observation   Lequita Halt MD [facility] Pager [phone]  04/02/2021, 4:42 PM

## 2021-04-02 NOTE — ED Provider Notes (Signed)
Emergency Medicine Provider Triage Evaluation Note  ROSEVELT LUU , a 65 y.o. male  was evaluated in triage.  Hx includes CAD s/p PCI, COPD (on chronic O2), HTN.  Pt complains of SOB over the past few days, worsening today. Has tried his home albuterol inhalers w/o relief.  Per EMS, patient is supposed to be on chronic oxygen via nasal cannula, but refuses to use it unless it is a "last resort". Was on O2 when EMS arrived with sats of 97% on 2L. Has had some associated nausea. No fevers, vomiting, syncope, chest pain, weight gain, leg swelling.  Review of Systems  Positive: As above Negative: As above  Physical Exam  BP 130/89 (BP Location: Right Arm)   Pulse (!) 113   Temp 99.7 F (37.6 C)   Resp (!) 26   SpO2 97%  Gen:   Awake, no distress   Resp:  Tachypneic with dyspnea. Diffuse expiratory wheezing. On 2L O2 via [place]. MSK:   Moves extremities without difficulty  Other:  Ill appearing.  Medical Decision Making  Medically screening exam initiated at 1:04 AM.  Appropriate orders placed.  MARQUAVIS HANNEN was informed that the remainder of the evaluation will be completed by another provider, this initial triage assessment does not replace that evaluation, and the importance of remaining in the ED until their evaluation is complete.  SOB, suspected COPD exacerbation    Antonietta Breach, Hershal Coria 04/02/21 0107    Quintella Reichert, MD 04/02/21 [phone]

## 2021-04-03 DIAGNOSIS — Z8674 Personal history of sudden cardiac arrest: Secondary | ICD-10-CM | POA: Diagnosis not present

## 2021-04-03 DIAGNOSIS — Z886 Allergy status to analgesic agent status: Secondary | ICD-10-CM | POA: Diagnosis not present

## 2021-04-03 DIAGNOSIS — I2510 Atherosclerotic heart disease of native coronary artery without angina pectoris: Secondary | ICD-10-CM | POA: Diagnosis present

## 2021-04-03 DIAGNOSIS — J9621 Acute and chronic respiratory failure with hypoxia: Secondary | ICD-10-CM | POA: Diagnosis present

## 2021-04-03 DIAGNOSIS — E119 Type 2 diabetes mellitus without complications: Secondary | ICD-10-CM | POA: Diagnosis present

## 2021-04-03 DIAGNOSIS — Z87891 Personal history of nicotine dependence: Secondary | ICD-10-CM | POA: Diagnosis not present

## 2021-04-03 DIAGNOSIS — R0902 Hypoxemia: Secondary | ICD-10-CM

## 2021-04-03 DIAGNOSIS — E785 Hyperlipidemia, unspecified: Secondary | ICD-10-CM | POA: Diagnosis present

## 2021-04-03 DIAGNOSIS — J441 Chronic obstructive pulmonary disease with (acute) exacerbation: Secondary | ICD-10-CM | POA: Diagnosis present

## 2021-04-03 DIAGNOSIS — Z9981 Dependence on supplemental oxygen: Secondary | ICD-10-CM | POA: Diagnosis not present

## 2021-04-03 DIAGNOSIS — Z8249 Family history of ischemic heart disease and other diseases of the circulatory system: Secondary | ICD-10-CM | POA: Diagnosis not present

## 2021-04-03 DIAGNOSIS — Z20822 Contact with and (suspected) exposure to covid-19: Secondary | ICD-10-CM | POA: Diagnosis present

## 2021-04-03 DIAGNOSIS — J9600 Acute respiratory failure, unspecified whether with hypoxia or hypercapnia: Secondary | ICD-10-CM | POA: Diagnosis not present

## 2021-04-03 DIAGNOSIS — J449 Chronic obstructive pulmonary disease, unspecified: Secondary | ICD-10-CM | POA: Diagnosis present

## 2021-04-03 DIAGNOSIS — I10 Essential (primary) hypertension: Secondary | ICD-10-CM | POA: Diagnosis present

## 2021-04-03 DIAGNOSIS — Z7901 Long term (current) use of anticoagulants: Secondary | ICD-10-CM | POA: Diagnosis not present

## 2021-04-03 DIAGNOSIS — Z888 Allergy status to other drugs, medicaments and biological substances status: Secondary | ICD-10-CM | POA: Diagnosis not present

## 2021-04-03 DIAGNOSIS — J101 Influenza due to other identified influenza virus with other respiratory manifestations: Secondary | ICD-10-CM | POA: Diagnosis present

## 2021-04-03 DIAGNOSIS — Z7984 Long term (current) use of oral hypoglycemic drugs: Secondary | ICD-10-CM | POA: Diagnosis not present

## 2021-04-03 DIAGNOSIS — I252 Old myocardial infarction: Secondary | ICD-10-CM | POA: Diagnosis not present

## 2021-04-03 DIAGNOSIS — J09X2 Influenza due to identified novel influenza A virus with other respiratory manifestations: Secondary | ICD-10-CM | POA: Diagnosis not present

## 2021-04-03 DIAGNOSIS — J439 Emphysema, unspecified: Secondary | ICD-10-CM | POA: Diagnosis not present

## 2021-04-03 DIAGNOSIS — Z79899 Other long term (current) drug therapy: Secondary | ICD-10-CM | POA: Diagnosis not present

## 2021-04-03 DIAGNOSIS — Z955 Presence of coronary angioplasty implant and graft: Secondary | ICD-10-CM | POA: Diagnosis not present

## 2021-04-03 DIAGNOSIS — Z7982 Long term (current) use of aspirin: Secondary | ICD-10-CM | POA: Diagnosis not present

## 2021-04-03 LAB — CBG MONITORING, ED
Glucose-Capillary: 116 mg/dL — ABNORMAL HIGH (ref 70–99)
Glucose-Capillary: 101 mg/dL — ABNORMAL HIGH (ref 70–99)
Glucose-Capillary: 126 mg/dL — ABNORMAL HIGH (ref 70–99)

## 2021-04-03 LAB — URINALYSIS, ROUTINE W REFLEX MICROSCOPIC
Bilirubin Urine: NEGATIVE
Glucose, UA: NEGATIVE mg/dL
Ketones, ur: NEGATIVE mg/dL
Nitrite: POSITIVE — AB
Protein, ur: NEGATIVE mg/dL
Specific Gravity, Urine: 1.02 (ref 1.005–1.030)
pH: 5.5 (ref 5.0–8.0)

## 2021-04-03 LAB — I-STAT ARTERIAL BLOOD GAS, ED
Acid-Base Excess: 0 mmol/L (ref 0.0–2.0)
Bicarbonate: 25.8 mmol/L (ref 20.0–28.0)
Calcium, Ion: 1.25 mmol/L (ref 1.15–1.40)
HCT: 48 % (ref 39.0–52.0)
Hemoglobin: 16.3 g/dL (ref 13.0–17.0)
O2 Saturation: 96 %
Patient temperature: 98.6
Potassium: 3.9 mmol/L (ref 3.5–5.1)
Sodium: 137 mmol/L (ref 135–145)
TCO2: 27 mmol/L (ref 22–32)
pCO2 arterial: 46.7 mmHg (ref 32.0–48.0)
pH, Arterial: 7.351 (ref 7.350–7.450)
pO2, Arterial: 91 mmHg (ref 83.0–108.0)

## 2021-04-03 LAB — CBC
HCT: 47.3 % (ref 39.0–52.0)
Hemoglobin: 15.5 g/dL (ref 13.0–17.0)
MCH: 30.6 pg (ref 26.0–34.0)
MCHC: 32.8 g/dL (ref 30.0–36.0)
MCV: 93.3 fL (ref 80.0–100.0)
Platelets: 301 10*3/uL (ref 150–400)
RBC: 5.07 MIL/uL (ref 4.22–5.81)
RDW: 14.1 % (ref 11.5–15.5)
WBC: 16.1 10*3/uL — ABNORMAL HIGH (ref 4.0–10.5)
nRBC: 0 % (ref 0.0–0.2)

## 2021-04-03 LAB — GLUCOSE, CAPILLARY
Glucose-Capillary: 126 mg/dL — ABNORMAL HIGH (ref 70–99)
Glucose-Capillary: 141 mg/dL — ABNORMAL HIGH (ref 70–99)

## 2021-04-03 LAB — URINALYSIS, MICROSCOPIC (REFLEX)

## 2021-04-03 LAB — PROCALCITONIN: Procalcitonin: 0.14 ng/mL

## 2021-04-03 MED ORDER — OSELTAMIVIR PHOSPHATE 75 MG PO CAPS
75.0000 mg | ORAL_CAPSULE | Freq: Two times a day (BID) | ORAL | Status: AC
  Administered 2021-04-03 – 2021-04-07 (×10): 75 mg via ORAL
  Filled 2021-04-03 (×10): qty 1

## 2021-04-03 MED ORDER — IPRATROPIUM-ALBUTEROL 0.5-2.5 (3) MG/3ML IN SOLN
RESPIRATORY_TRACT | Status: AC
  Administered 2021-04-03: 3 mL via RESPIRATORY_TRACT
  Filled 2021-04-03: qty 3

## 2021-04-03 MED ORDER — IPRATROPIUM-ALBUTEROL 0.5-2.5 (3) MG/3ML IN SOLN
3.0000 mL | RESPIRATORY_TRACT | Status: DC | PRN
  Administered 2021-04-06: 21:00:00 3 mL via RESPIRATORY_TRACT

## 2021-04-03 MED ORDER — BUDESON-GLYCOPYRROL-FORMOTEROL 160-9-4.8 MCG/ACT IN AERO
2.0000 | INHALATION_SPRAY | Freq: Two times a day (BID) | RESPIRATORY_TRACT | Status: DC
Start: 2021-04-03 — End: 2021-04-09
  Administered 2021-04-03 – 2021-04-09 (×12): 2 via RESPIRATORY_TRACT
  Filled 2021-04-03 (×2): qty 1

## 2021-04-03 MED ORDER — IPRATROPIUM-ALBUTEROL 0.5-2.5 (3) MG/3ML IN SOLN
3.0000 mL | Freq: Four times a day (QID) | RESPIRATORY_TRACT | Status: DC
  Administered 2021-04-03 (×4): 3 mL via RESPIRATORY_TRACT
  Filled 2021-04-03 (×3): qty 3

## 2021-04-03 MED ORDER — IPRATROPIUM-ALBUTEROL 0.5-2.5 (3) MG/3ML IN SOLN
3.0000 mL | Freq: Three times a day (TID) | RESPIRATORY_TRACT | Status: DC
Start: 2021-04-04 — End: 2021-04-04
  Administered 2021-04-04: 3 mL via RESPIRATORY_TRACT
  Filled 2021-04-03 (×2): qty 3

## 2021-04-03 MED ORDER — METHYLPREDNISOLONE SODIUM SUCC 125 MG IJ SOLR
90.0000 mg | Freq: Two times a day (BID) | INTRAMUSCULAR | Status: DC
  Administered 2021-04-03 – 2021-04-05 (×5): 90 mg via INTRAVENOUS
  Filled 2021-04-03 (×5): qty 2

## 2021-04-03 MED ORDER — GUAIFENESIN ER 600 MG PO TB12
600.0000 mg | ORAL_TABLET | Freq: Two times a day (BID) | ORAL | Status: DC
  Administered 2021-04-03 – 2021-04-09 (×13): 600 mg via ORAL
  Filled 2021-04-03 (×13): qty 1

## 2021-04-03 MED ORDER — SALINE SPRAY 0.65 % NA SOLN
1.0000 | NASAL | Status: DC | PRN
  Filled 2021-04-03: qty 44

## 2021-04-03 NOTE — Plan of Care (Signed)
  Problem: Education: Goal: Knowledge of General Education information will improve Description: Including pain rating scale, medication(s)/side effects and non-pharmacologic comfort measures Outcome: Progressing   Problem: Activity: Goal: Risk for activity intolerance will decrease Outcome: Progressing   Problem: Coping: Goal: Level of anxiety will decrease Outcome: Progressing   Problem: Elimination: Goal: Will not experience complications related to bowel motility Outcome: Progressing   Problem: Pain Managment: Goal: General experience of comfort will improve Outcome: Progressing   Problem: Safety: Goal: Ability to remain free from injury will improve Outcome: Progressing   

## 2021-04-03 NOTE — Progress Notes (Signed)
New admission to room 5N28. Patient is A&O*4. Able to verbalize his needs to staff. No CO pain or discomfort at this time. Tele monitor applied and CCMD notified. Oxygen on HFNC at 10L. VS checked and continuous pulse oxymetry connected. Bed kept in low position and locked. Droplet precaution initiated for flu. Will continue to monitor.

## 2021-04-03 NOTE — TOC Initial Note (Signed)
Transition of Care [facility]) - Initial/Assessment Note    Patient Details  Name: Timothy [name] MRN: [id number] Date of Birth: January 24, 1956  [facility] CM/SW Contact:    Verdell Carmine, RN Phone Number: 04/03/2021, 10:54 AM  Clinical Narrative:                 Patient presented with [facility], has underlying COPD. Currently in OBS status, attempted to call patient to give notice. He has been noncompliant with treatments here, as he wants his own personal inhalers.. WBC 20  CM will follow for needs, recommendations, and transitions  Expected Discharge Plan: Home/Self Care Barriers to Discharge: Continued Medical Work up   Patient Goals and CMS Choice        Expected Discharge Plan and Services Expected Discharge Plan: Home/Self Care   Discharge Planning Services:  (Has home oxygen- uses PRN)   Living arrangements for the past 2 months: [place]                                      Prior Living Arrangements/Services Living arrangements for the past 2 months: Single Family Home   Patient language and need for interpreter reviewed:: Yes        Need for Family Participation in Patient Care: Yes (Comment) Care giver support system in place?: Yes (comment)   Criminal Activity/Legal Involvement Pertinent to Current Situation/Hospitalization: No - Comment as needed  Activities of Daily Living      Permission Sought/Granted                  Emotional Assessment       Orientation: : Oriented to Self, Oriented to  Time, Oriented to Place, Oriented to Situation Alcohol / Substance Use: Not Applicable (Prev tobacco use) Psych Involvement: No (comment)  Admission diagnosis:  COPD (chronic obstructive pulmonary disease) ([place]) [J44.9] Patient Active Problem List   Diagnosis Date Noted   COPD (chronic obstructive pulmonary disease) ([place]) 04/02/2021   Exercise hypoxemia 07/02/2020   Acute STEMI of inferior wall  02/24/2020   Malignant  neoplasm of urinary bladder ([place]) 02/23/2017   Lung nodule 11/20/2016   Gastroesophageal reflux disease without esophagitis 11/20/2016   Prediabetes 12/29/2013   Dental cavities 12/29/2013   DOE (dyspnea on exertion) 08/30/2013   Essential hypertension 08/03/2013   CAD S/P percutaneous coronary angioplasty 06/19/2013   Hyperlipidemia with target LDL less than 70 06/19/2013   Former smoker 06/19/2013   NSTEMI (non-ST elevated myocardial infarction), DES to RCA 06/13/2013   VT (ventricular tachycardia), requiring 2 shocks 06/12/2013   COPD GOLD III 06/12/2013   PCP:  Ladell Pier, MD Pharmacy:   [facility] [phone] [address] AT [address] [place] [address] Phone: [phone] Fax: [phone]     Social Determinants of Health (SDOH) Interventions    Readmission Risk Interventions No flowsheet data found.

## 2021-04-03 NOTE — ED Notes (Signed)
[place] MD called regarding Rn's page. Per [place] MD, pt is allowed to take home Breztri inhaler.

## 2021-04-03 NOTE — Plan of Care (Signed)

## 2021-04-03 NOTE — ED Notes (Signed)
Pt c/o of increased work of breathing and SOB after neb treatment. RT called

## 2021-04-03 NOTE — ED Notes (Addendum)
Pt was notified and educated about sending his home meds to [facility], including his home inhaler. Pt consented to having his medications sent to pharmacy.  Drugs that were inventoried and sent to main pharmacy are as listed below:   Breztri Inhaler 81 mg ASA Brilinta tablets 90mg  Nitroglycerin SL tablets 0.4mg  Bisoprolol fumarate 5mg  tablets Metformin 500 mg tablets Atorvastatin 80mg  tablets.

## 2021-04-03 NOTE — ED Notes (Signed)
Paged Floor Coverage [facility] MD Dr Tonie Griffith

## 2021-04-03 NOTE — ED Notes (Signed)
When trying to administer pt's breathing treatments pt refused to take a puff from either inhaler that was ordered. Pt stated, "I won't be taking either of those, cause they aren't my personal inhaler. I know that they aren't giving me what I need cause it's too expensive." When this RN educated pt that the inhalers that are being ordered are the same drug and do the same mechanism of action as his home meds, pt still refused the inhalers that were ordered.

## 2021-04-03 NOTE — ED Notes (Addendum)
Patient was seen with an unfamiliar inhaler. When this RN asked if this was given to him by the previous RN, pt denied and stated that this was his personal inhaler from back home. This RN also noted that pt's family member brought in a grocery bag full of home meds. Pt stated, "I haven't been getting any breathing treatments while here, and I need it, so I used my personal medications to help." This RN educated pt and family member on not taking home medications while in the hospital and that pt has some breathing treatments ordered that are coming from pharmacy. Pt understood education and stated he will comply.

## 2021-04-03 NOTE — Progress Notes (Signed)
[facility]  PROGRESS NOTE  Timothy [name] XTK:[id number] DOB: 1956-01-11 DOA: 04/02/2021 PCP: Ladell Pier, MD   Brief HPI:   65 year old male with medical history of COPD, Gold stage III on as needed oxygen at home, hypertension, hyperlipidemia, CAD with stenting in 2021, diabetes mellitus type 2 presented with worsening cough, wheezing and shortness of breath.  Patient states that symptoms started 3 to 4 days ago, gradually getting worse, minimum activity triggered cough and shortness of breath.  CT angiogram was negative for pulmonary embolism.    Subjective   Patient seen, complains of coughing up white phlegm with streaks of blood.  Requiring 10 L/min HFNC.   Assessment/Plan:     Acute hypoxemic respiratory failure -Multifactorial from COPD exacerbation, influenza A+ -We will start on Tamiflu 75 mg p.o. twice daily -Continue Solu-Medrol 90 mg IV every 12 hours -DuoNeb nebulizers every 6 hours scheduled -Mucinex 6 mg p.o. twice daily -Flutter valve every 4 hours -Also started on Levaquin 750 mg p.o. daily  Influenza A -Positive influenza A by PCR -Started on Tamiflu  Hypertension -Blood pressure is well controlled -Continue bisoprolol  History of CAD s/p stenting -Continue aspirin, Brilinta -Continue atorvastatin, bisoprolol  Diabetes mellitus type 2 -Metformin on hold as patient received IV contrast -Continue sliding scale insulin NovoLog -CBG well controlled     Medications     aspirin EC  81 mg Oral Once per day on Mon Thu   atorvastatin  80 mg Oral Daily   bisoprolol  2.5 mg Oral QHS   Budeson-Glycopyrrol-Formoterol  2 puff Inhalation BID   enoxaparin (LOVENOX) injection  40 mg Subcutaneous Q24H   guaiFENesin  600 mg Oral BID   insulin aspart  0-15 Units Subcutaneous TID WC   ipratropium-albuterol  3 mL Nebulization Q6H   levofloxacin  750 mg Oral Daily   methylPREDNISolone (SOLU-MEDROL) injection  90 mg Intravenous Q12H    oseltamivir  75 mg Oral BID   ticagrelor  90 mg Oral BID     Data Reviewed:   CBG:  Recent Labs  Lab 04/02/21 1656 04/03/21 0144 04/03/21 0745 04/03/21 1125 04/03/21 1708  GLUCAP 118* 116* 101* 126* 126*    SpO2: 98 % O2 Flow Rate (L/min): (S) 10 L/min    Vitals:   04/03/21 1400 04/03/21 1448 04/03/21 1600 04/03/21 1650  BP: 127/77  114/68 123/65  Pulse: (!) 110  95 (!) 105  Resp:   20 20  Temp:  98.7 F (37.1 C)  98.4 F (36.9 C)  TempSrc:  Oral  Oral  SpO2: 97%  98% 98%  Height:    5\' 4"  (1.626 m)     Intake/Output Summary (Last 24 hours) at 04/03/2021 1806 Last data filed at 04/03/2021 0758 Gross per 24 hour  Intake --  Output 300 ml  Net -300 ml    No intake/output data recorded.  There were no vitals filed for this visit.  Data Reviewed: Basic Metabolic Panel: Recent Labs  Lab 04/02/21 0125 04/02/21 0202 04/03/21 0225  NA 136 139 137  K 3.9 4.0 3.9  CL 102  --   --   CO2 25  --   --   GLUCOSE 104*  --   --   BUN 13  --   --   CREATININE 0.96  --   --   CALCIUM 9.3  --   --    Liver Function Tests: No results for input(s): AST, ALT, ALKPHOS, BILITOT, PROT, ALBUMIN in the  last 168 hours. No results for input(s): LIPASE, AMYLASE in the last 168 hours. No results for input(s): AMMONIA in the last 168 hours. CBC: Recent Labs  Lab 04/02/21 0125 04/02/21 0202 04/03/21 0225 04/03/21 0418  WBC 20.1*  --   --  16.1*  NEUTROABS 16.5*  --   --   --   HGB 17.1* 17.3* 16.3 15.5  HCT 51.4 51.0 48.0 47.3  MCV 93.8  --   --  93.3  PLT 328  --   --  301   Cardiac Enzymes: No results for input(s): CKTOTAL, CKMB, CKMBINDEX, TROPONINI in the last 168 hours. BNP (last 3 results) Recent Labs    04/02/21 1417  BNP 59.1    ProBNP (last 3 results) Recent Labs    05/09/20 1648  PROBNP 29.0    CBG: Recent Labs  Lab 04/02/21 1656 04/03/21 0144 04/03/21 0745 04/03/21 1125 04/03/21 1708  GLUCAP 118* 116* 101* 126* 126*       Radiology  Reports  DG Chest 2 View  Result Date: 04/02/2021 CLINICAL DATA:  Shortness of breath EXAM: CHEST - 2 VIEW COMPARISON:  02/24/2020 FINDINGS: The lungs are hyperinflated with diffuse interstitial prominence. No focal airspace consolidation or pulmonary edema. No pleural effusion or pneumothorax. Normal cardiomediastinal contours. IMPRESSION: COPD without acute airspace disease. Electronically Signed   By: Ulyses Jarred M.D.   On: 04/02/2021 02:31   CT Angio Chest PE W/Cm &/Or Wo Cm  Result Date: 04/02/2021 CLINICAL DATA:  Pulmonary emboli suspected, high probability. Shortness of breath over the last several days. EXAM: CT ANGIOGRAPHY CHEST WITH CONTRAST TECHNIQUE: Multidetector CT imaging of the chest was performed using the standard protocol during bolus administration of intravenous contrast. Multiplanar CT image reconstructions and MIPs were obtained to evaluate the vascular anatomy. CONTRAST:  57mL OMNIPAQUE IOHEXOL 350 MG/ML SOLN COMPARISON:  Chest radiography same day.  CT 11/25/2016 FINDINGS: Cardiovascular: Heart size is normal. There is coronary artery calcification and/or stents. There is aortic atherosclerotic calcification. Pulmonary arterial opacification is excellent. There are no pulmonary emboli. Mediastinum/Nodes: No mediastinal or hilar mass or lymphadenopathy. Lungs/Pleura: Advanced pulmonary emphysema, upper lung predominant. There are a few mucous filled bronchi in both lower lobes but there is no parenchymal consolidation, collapse or effusion. There is no pulmonary mass or nodule in need of follow-up. 3 mm subpleural nodule in the right lower lobe is unchanged and benign. Upper Abdomen: Normal Musculoskeletal: Normal Review of the MIP images confirms the above findings. IMPRESSION: No pulmonary emboli. Coronary artery calcification and/or stents. Aortic Atherosclerosis (ICD10-I70.0) and Emphysema (ICD10-J43.9). The emphysema is quite advanced. No pulmonary infiltrate or collapse.  There are a few mucous filled bronchi in the lower lobes Electronically Signed   By: Nelson Chimes M.D.   On: 04/02/2021 16:24       Antibiotics: Anti-infectives (From admission, onward)    Start     Dose/Rate Route Frequency Ordered Stop   04/04/21 0000  azithromycin (ZITHROMAX) tablet 500 mg  Status:  Discontinued       See Hyperspace for full Linked Orders Report.   500 mg Oral Daily 04/02/21 1637 04/02/21 1637   04/03/21 1800  levofloxacin (LEVAQUIN) tablet 750 mg        750 mg Oral Daily 04/02/21 1640     04/03/21 1345  oseltamivir (TAMIFLU) capsule 75 mg        75 mg Oral 2 times daily 04/03/21 1339 04/08/21 0959   04/03/21 1000  azithromycin (ZITHROMAX) tablet 250 mg  Status:  Discontinued        250 mg Oral Daily 04/02/21 1638 04/02/21 1640   04/03/21 0000  azithromycin (ZITHROMAX) 500 mg in sodium chloride 0.9 % 250 mL IVPB  Status:  Discontinued       See Hyperspace for full Linked Orders Report.   500 mg 250 mL/hr over 60 Minutes Intravenous Every 24 hours 04/02/21 1637 04/02/21 1638   04/02/21 1530  cefTRIAXone (ROCEPHIN) 1 g in sodium chloride 0.9 % 100 mL IVPB        1 g 200 mL/hr over 30 Minutes Intravenous  Once 04/02/21 1515 04/02/21 1610   04/02/21 1530  azithromycin (ZITHROMAX) 500 mg in sodium chloride 0.9 % 250 mL IVPB        500 mg 250 mL/hr over 60 Minutes Intravenous  Once 04/02/21 1515 04/02/21 1714         DVT prophylaxis: Lovenox  Code Status: Full code  Family Communication: No family at bedside   Consultants:   Procedures:     Objective    Physical Examination:  General-appears in no acute distress Heart-S1-S2, regular, no murmur auscultated Lungs-clear to auscultation bilaterally, no wheezing or crackles auscultated Abdomen-soft, nontender, no organomegaly Extremities-no edema in the lower extremities Neuro-alert, oriented x3, no focal deficit noted  Status is: Inpatient  Dispo: The patient is from: Home               Anticipated d/c is to: Home              Anticipated d/c date is: 04/07/2021              Patient currently not stable for discharge  Barrier to discharge-acute hypoxemic respiratory failure  COVID-19 Labs  No results for input(s): DDIMER, FERRITIN, LDH, CRP in the last 72 hours.  Lab Results  Component Value Date   SARSCOV2NAA NEGATIVE 04/02/2021   [place] NEGATIVE 06/28/2020   [place] NEGATIVE 02/24/2020            Recent Results (from the past 240 hour(s))  Blood Culture (routine x 2)     Status: None (Preliminary result)   Collection Time: 04/02/21  1:42 PM   Specimen: BLOOD  Result Value Ref Range Status   Specimen Description BLOOD SITE NOT SPECIFIED  Final   Special Requests   Final    BOTTLES DRAWN AEROBIC AND ANAEROBIC Blood Culture adequate volume   Culture   Final    NO GROWTH < 24 HOURS Performed at [facility], [place] [address], [place], [place] [address]    Report Status PENDING  Incomplete  Blood Culture (routine x 2)     Status: None (Preliminary result)   Collection Time: 04/02/21  2:45 PM   Specimen: BLOOD  Result Value Ref Range Status   Specimen Description BLOOD RIGHT ANTECUBITAL  Final   Special Requests   Final    BOTTLES DRAWN AEROBIC AND ANAEROBIC Blood Culture results may not be optimal due to an inadequate volume of blood received in culture bottles   Culture   Final    NO GROWTH < 24 HOURS Performed at [facility], [place] [address], [place], [place] [address]    Report Status PENDING  Incomplete  Resp Panel by RT-PCR (Flu A&B, Covid) Nasopharyngeal Swab     Status: Abnormal   Collection Time: 04/02/21  4:16 PM   Specimen: Nasopharyngeal Swab; Nasopharyngeal(NP) swabs in vial transport medium  Result Value Ref Range Status  SARS Coronavirus 2 by RT PCR NEGATIVE NEGATIVE Final    Comment: (NOTE) SARS-CoV-2 target nucleic acids are NOT DETECTED.  The SARS-CoV-2 RNA is generally detectable in upper  respiratory specimens during the acute phase of infection. The lowest concentration of SARS-CoV-2 viral copies this assay can detect is 138 copies/mL. A negative result does not preclude SARS-Cov-2 infection and should not be used as the sole basis for treatment or other patient management decisions. A negative result may occur with  improper specimen collection/handling, submission of specimen other than nasopharyngeal swab, presence of viral mutation(s) within the areas targeted by this assay, and inadequate number of viral copies(<138 copies/mL). A negative result must be combined with clinical observations, patient history, and epidemiological information. The expected result is Negative.  Fact Sheet for Patients:  EntrepreneurPulse.com.au  Fact Sheet for Healthcare Providers:  IncredibleEmployment.be  This test is no t yet approved or cleared by the [place] FDA and  has been authorized for detection and/or diagnosis of SARS-CoV-2 by FDA under an Emergency Use Authorization (EUA). This EUA will remain  in effect (meaning this test can be used) for the duration of the COVID-19 declaration under Section 564(b)(1) of the Act, 21 U.S.C.section 360bbb-3(b)(1), unless the authorization is terminated  or revoked sooner.       Influenza A by PCR POSITIVE (A) NEGATIVE Final   Influenza B by PCR NEGATIVE NEGATIVE Final    Comment: (NOTE) The Xpert Xpress SARS-CoV-2/FLU/RSV plus assay is intended as an aid in the diagnosis of influenza from Nasopharyngeal swab specimens and should not be used as a sole basis for treatment. Nasal washings and aspirates are unacceptable for Xpert Xpress SARS-CoV-2/FLU/RSV testing.  Fact Sheet for Patients: EntrepreneurPulse.com.au  Fact Sheet for Healthcare Providers: IncredibleEmployment.be  This test is not yet approved or cleared by the [place] FDA and has been  authorized for detection and/or diagnosis of SARS-CoV-2 by FDA under an Emergency Use Authorization (EUA). This EUA will remain in effect (meaning this test can be used) for the duration of the COVID-19 declaration under Section 564(b)(1) of the Act, 21 U.S.C. section 360bbb-3(b)(1), unless the authorization is terminated or revoked.  Performed at [facility], [place] [address] [place], [place] [address]     [facility] If 7PM-7AM, please contact night-coverage at [email or web address], Office  [phone]   04/03/2021, 6:06 PM  LOS: 0 days

## 2021-04-03 NOTE — ED Notes (Signed)
Called ED pharmacy to notify of pt refusal of inhalers. Pharmacy to come to bedside and educate pt on generic and brand name inhalers being the same medication just different names. Pt is not responsive to education by RN.

## 2021-04-03 NOTE — ED Notes (Signed)
Admitting MD paged regarding pts increased work of breathing

## 2021-04-03 NOTE — ED Notes (Signed)
Darrick Meigs MD paged per primary RN regarding pt uncooperativeness w/ medical treatment.

## 2021-04-04 DIAGNOSIS — R0902 Hypoxemia: Secondary | ICD-10-CM | POA: Diagnosis not present

## 2021-04-04 DIAGNOSIS — J441 Chronic obstructive pulmonary disease with (acute) exacerbation: Secondary | ICD-10-CM | POA: Diagnosis not present

## 2021-04-04 DIAGNOSIS — J9600 Acute respiratory failure, unspecified whether with hypoxia or hypercapnia: Secondary | ICD-10-CM

## 2021-04-04 DIAGNOSIS — J439 Emphysema, unspecified: Secondary | ICD-10-CM | POA: Diagnosis not present

## 2021-04-04 LAB — CBC
HCT: 44.8 % (ref 39.0–52.0)
Hemoglobin: 14.9 g/dL (ref 13.0–17.0)
MCH: 30.7 pg (ref 26.0–34.0)
MCHC: 33.3 g/dL (ref 30.0–36.0)
MCV: 92.2 fL (ref 80.0–100.0)
Platelets: 298 10*3/uL (ref 150–400)
RBC: 4.86 MIL/uL (ref 4.22–5.81)
RDW: 13.7 % (ref 11.5–15.5)
WBC: 10.9 10*3/uL — ABNORMAL HIGH (ref 4.0–10.5)
nRBC: 0 % (ref 0.0–0.2)

## 2021-04-04 LAB — BASIC METABOLIC PANEL
Anion gap: 8 (ref 5–15)
BUN: 24 mg/dL — ABNORMAL HIGH (ref 8–23)
CO2: 25 mmol/L (ref 22–32)
Calcium: 8.6 mg/dL — ABNORMAL LOW (ref 8.9–10.3)
Chloride: 101 mmol/L (ref 98–111)
Creatinine, Ser: 0.81 mg/dL (ref 0.61–1.24)
GFR, Estimated: >60 mL/min (ref >60)
Glucose, Bld: 137 mg/dL — ABNORMAL HIGH (ref 70–99)
Potassium: 4.3 mmol/L (ref 3.5–5.1)
Sodium: 134 mmol/L — ABNORMAL LOW (ref 135–145)

## 2021-04-04 LAB — GLUCOSE, CAPILLARY
Glucose-Capillary: 132 mg/dL — ABNORMAL HIGH (ref 70–99)
Glucose-Capillary: 151 mg/dL — ABNORMAL HIGH (ref 70–99)
Glucose-Capillary: 139 mg/dL — ABNORMAL HIGH (ref 70–99)

## 2021-04-04 LAB — PROCALCITONIN: Procalcitonin: 2.58 ng/mL

## 2021-04-04 LAB — URINE CULTURE

## 2021-04-04 LAB — LEGIONELLA PNEUMOPHILA SEROGP 1 UR AG: L. pneumophila Serogp 1 Ur Ag: NEGATIVE

## 2021-04-04 MED ORDER — IPRATROPIUM-ALBUTEROL 0.5-2.5 (3) MG/3ML IN SOLN
3.0000 mL | Freq: Two times a day (BID) | RESPIRATORY_TRACT | Status: DC
  Administered 2021-04-04 – 2021-04-09 (×10): 3 mL via RESPIRATORY_TRACT
  Filled 2021-04-04 (×10): qty 3

## 2021-04-04 NOTE — Progress Notes (Addendum)
[facility]  PROGRESS NOTE  Timothy [name] JJO:[id number] DOB: June 16, 1955 DOA: 04/02/2021 PCP: Ladell Pier, MD   Brief HPI:   65 year old male with medical history of COPD, Gold stage III on as needed oxygen at home, hypertension, hyperlipidemia, CAD with stenting in 2021, diabetes mellitus type 2 presented with worsening cough, wheezing and shortness of breath.  Patient states that symptoms started 3 to 4 days ago, gradually getting worse, minimum activity triggered cough and shortness of breath.  CT angiogram was negative for pulmonary embolism.    Subjective   Patient feels better this morning, still requiring 10 L/min HFNC.  WBC down to 10,000.   Assessment/Plan:     Acute hypoxemic respiratory failure -Slowly improving -Multifactorial from COPD exacerbation, influenza A+ -Tamiflu 75 mg p.o. twice daily -Continue Solu-Medrol 90 mg IV every 12 hours -DuoNeb nebulizers every 6 hours scheduled -Mucinex 6 mg p.o. twice daily -Flutter valve every 4 hours -Also started on Levaquin 750 mg p.o. daily -WBC down to 10,000  Influenza A -Positive influenza A by PCR -Started on Tamiflu  Hypertension -Blood pressure is well controlled -Continue bisoprolol  History of CAD s/p stenting -Continue aspirin, Brilinta -Continue atorvastatin, bisoprolol  Diabetes mellitus type 2 -Metformin on hold as patient received IV contrast -Continue sliding scale insulin NovoLog -CBG well controlled     Medications     aspirin EC  81 mg Oral Once per day on Mon Thu   atorvastatin  80 mg Oral Daily   bisoprolol  2.5 mg Oral QHS   Budeson-Glycopyrrol-Formoterol  2 puff Inhalation BID   enoxaparin (LOVENOX) injection  40 mg Subcutaneous Q24H   guaiFENesin  600 mg Oral BID   insulin aspart  0-15 Units Subcutaneous TID WC   ipratropium-albuterol  3 mL Nebulization BID   levofloxacin  750 mg Oral Daily   methylPREDNISolone (SOLU-MEDROL) injection  90 mg Intravenous Q12H    oseltamivir  75 mg Oral BID   ticagrelor  90 mg Oral BID     Data Reviewed:   CBG:  Recent Labs  Lab 04/03/21 0745 04/03/21 1125 04/03/21 1708 04/03/21 2034 04/04/21 0523  GLUCAP 101* 126* 126* 141* 132*    SpO2: 95 % O2 Flow Rate (L/min): 10 L/min    Vitals:   04/03/21 2031 04/04/21 0446 04/04/21 0824 04/04/21 0842  BP: 133/73 123/72 113/70   Pulse: (!) 104 86 87   Resp: 18 17 17    Temp: 97.7 F (36.5 C) (!) 97.4 F (36.3 C) (!) 97.4 F (36.3 C)   TempSrc: Oral     SpO2: 99% 100% 98% 95%  Height:         Intake/Output Summary (Last 24 hours) at 04/04/2021 1207 Last data filed at 04/03/2021 2050 Gross per 24 hour  Intake --  Output 275 ml  Net -275 ml    11/30 1901 - 12/02 0700 In: -  Out: 575 [Urine:575]  There were no vitals filed for this visit.  Data Reviewed: Basic Metabolic Panel: Recent Labs  Lab 04/02/21 0125 04/02/21 0202 04/03/21 0225 04/04/21 0339  NA 136 139 137 134*  K 3.9 4.0 3.9 4.3  CL 102  --   --  101  CO2 25  --   --  25  GLUCOSE 104*  --   --  137*  BUN 13  --   --  24*  CREATININE 0.96  --   --  0.81  CALCIUM 9.3  --   --  8.6*  Liver Function Tests: No results for input(s): AST, ALT, ALKPHOS, BILITOT, PROT, ALBUMIN in the last 168 hours. No results for input(s): LIPASE, AMYLASE in the last 168 hours. No results for input(s): AMMONIA in the last 168 hours. CBC: Recent Labs  Lab 04/02/21 0125 04/02/21 0202 04/03/21 0225 04/03/21 0418 04/04/21 0339  WBC 20.1*  --   --  16.1* 10.9*  NEUTROABS 16.5*  --   --   --   --   HGB 17.1* 17.3* 16.3 15.5 14.9  HCT 51.4 51.0 48.0 47.3 44.8  MCV 93.8  --   --  93.3 92.2  PLT 328  --   --  301 298   Cardiac Enzymes: No results for input(s): CKTOTAL, CKMB, CKMBINDEX, TROPONINI in the last 168 hours. BNP (last 3 results) Recent Labs    04/02/21 1417  BNP 59.1    ProBNP (last 3 results) Recent Labs    05/09/20 1648  PROBNP 29.0    CBG: Recent Labs  Lab  04/03/21 0745 04/03/21 1125 04/03/21 1708 04/03/21 2034 04/04/21 0523  GLUCAP 101* 126* 126* 141* 132*       Radiology Reports  CT Angio Chest PE W/Cm &/Or Wo Cm  Result Date: 04/02/2021 CLINICAL DATA:  Pulmonary emboli suspected, high probability. Shortness of breath over the last several days. EXAM: CT ANGIOGRAPHY CHEST WITH CONTRAST TECHNIQUE: Multidetector CT imaging of the chest was performed using the standard protocol during bolus administration of intravenous contrast. Multiplanar CT image reconstructions and MIPs were obtained to evaluate the vascular anatomy. CONTRAST:  47mL OMNIPAQUE IOHEXOL 350 MG/ML SOLN COMPARISON:  Chest radiography same day.  CT 11/25/2016 FINDINGS: Cardiovascular: Heart size is normal. There is coronary artery calcification and/or stents. There is aortic atherosclerotic calcification. Pulmonary arterial opacification is excellent. There are no pulmonary emboli. Mediastinum/Nodes: No mediastinal or hilar mass or lymphadenopathy. Lungs/Pleura: Advanced pulmonary emphysema, upper lung predominant. There are a few mucous filled bronchi in both lower lobes but there is no parenchymal consolidation, collapse or effusion. There is no pulmonary mass or nodule in need of follow-up. 3 mm subpleural nodule in the right lower lobe is unchanged and benign. Upper Abdomen: Normal Musculoskeletal: Normal Review of the MIP images confirms the above findings. IMPRESSION: No pulmonary emboli. Coronary artery calcification and/or stents. Aortic Atherosclerosis (ICD10-I70.0) and Emphysema (ICD10-J43.9). The emphysema is quite advanced. No pulmonary infiltrate or collapse. There are a few mucous filled bronchi in the lower lobes Electronically Signed   By: Nelson Chimes M.D.   On: 04/02/2021 16:24       Antibiotics: Anti-infectives (From admission, onward)    Start     Dose/Rate Route Frequency Ordered Stop   04/04/21 0000  azithromycin (ZITHROMAX) tablet 500 mg  Status:   Discontinued       See Hyperspace for full Linked Orders Report.   500 mg Oral Daily 04/02/21 1637 04/02/21 1637   04/03/21 1800  levofloxacin (LEVAQUIN) tablet 750 mg        750 mg Oral Daily 04/02/21 1640     04/03/21 1345  oseltamivir (TAMIFLU) capsule 75 mg        75 mg Oral 2 times daily 04/03/21 1339 04/08/21 0959   04/03/21 1000  azithromycin (ZITHROMAX) tablet 250 mg  Status:  Discontinued        250 mg Oral Daily 04/02/21 1638 04/02/21 1640   04/03/21 0000  azithromycin (ZITHROMAX) 500 mg in sodium chloride 0.9 % 250 mL IVPB  Status:  Discontinued  See Hyperspace for full Linked Orders Report.   500 mg 250 mL/hr over 60 Minutes Intravenous Every 24 hours 04/02/21 1637 04/02/21 1638   04/02/21 1530  cefTRIAXone (ROCEPHIN) 1 g in sodium chloride 0.9 % 100 mL IVPB        1 g 200 mL/hr over 30 Minutes Intravenous  Once 04/02/21 1515 04/02/21 1610   04/02/21 1530  azithromycin (ZITHROMAX) 500 mg in sodium chloride 0.9 % 250 mL IVPB        500 mg 250 mL/hr over 60 Minutes Intravenous  Once 04/02/21 1515 04/02/21 1714         DVT prophylaxis: Lovenox  Code Status: Full code  Family Communication: No family at bedside   Consultants:   Procedures:     Objective    Physical Examination:  General-appears in no acute distress Heart-S1-S2, regular, no murmur auscultated Lungs-bilateral rhonchi auscultated Abdomen-soft, nontender, no organomegaly Extremities-no edema in the lower extremities Neuro-alert, oriented x3, no focal deficit noted  Status is: Inpatient  Dispo: The patient is from: Home              Anticipated d/c is to: Home              Anticipated d/c date is: 04/07/2021              Patient currently not stable for discharge  Barrier to discharge-acute hypoxemic respiratory failure  COVID-19 Labs  No results for input(s): DDIMER, FERRITIN, LDH, CRP in the last 72 hours.  Lab Results  Component Value Date   SARSCOV2NAA NEGATIVE 04/02/2021    [place] NEGATIVE 06/28/2020   [place] NEGATIVE 02/24/2020            Recent Results (from the past 240 hour(s))  Blood Culture (routine x 2)     Status: None (Preliminary result)   Collection Time: 04/02/21  1:42 PM   Specimen: BLOOD  Result Value Ref Range Status   Specimen Description BLOOD SITE NOT SPECIFIED  Final   Special Requests   Final    BOTTLES DRAWN AEROBIC AND ANAEROBIC Blood Culture adequate volume   Culture   Final    NO GROWTH 2 DAYS Performed at [facility], [address], [place], [place] [address]    Report Status PENDING  Incomplete  Blood Culture (routine x 2)     Status: None (Preliminary result)   Collection Time: 04/02/21  2:45 PM   Specimen: BLOOD  Result Value Ref Range Status   Specimen Description BLOOD RIGHT ANTECUBITAL  Final   Special Requests   Final    BOTTLES DRAWN AEROBIC AND ANAEROBIC Blood Culture results may not be optimal due to an inadequate volume of blood received in culture bottles   Culture   Final    NO GROWTH 2 DAYS Performed at [facility], [place] [address], [place], [place] [address]    Report Status PENDING  Incomplete  Resp Panel by RT-PCR (Flu A&B, Covid) Nasopharyngeal Swab     Status: Abnormal   Collection Time: 04/02/21  4:16 PM   Specimen: Nasopharyngeal Swab; Nasopharyngeal(NP) swabs in vial transport medium  Result Value Ref Range Status   SARS Coronavirus 2 by RT PCR NEGATIVE NEGATIVE Final    Comment: (NOTE) SARS-CoV-2 target nucleic acids are NOT DETECTED.  The SARS-CoV-2 RNA is generally detectable in upper respiratory specimens during the acute phase of infection. The lowest concentration of SARS-CoV-2 viral copies this assay can detect is 138 copies/mL. A  negative result does not preclude SARS-Cov-2 infection and should not be used as the sole basis for treatment or other patient management decisions. A negative result may occur with  improper specimen collection/handling,  submission of specimen other than nasopharyngeal swab, presence of viral mutation(s) within the areas targeted by this assay, and inadequate number of viral copies(<138 copies/mL). A negative result must be combined with clinical observations, patient history, and epidemiological information. The expected result is Negative.  Fact Sheet for Patients:  EntrepreneurPulse.com.au  Fact Sheet for Healthcare Providers:  IncredibleEmployment.be  This test is no t yet approved or cleared by the [place] FDA and  has been authorized for detection and/or diagnosis of SARS-CoV-2 by FDA under an Emergency Use Authorization (EUA). This EUA will remain  in effect (meaning this test can be used) for the duration of the COVID-19 declaration under Section 564(b)(1) of the Act, 21 U.S.C.section 360bbb-3(b)(1), unless the authorization is terminated  or revoked sooner.       Influenza A by PCR POSITIVE (A) NEGATIVE Final   Influenza B by PCR NEGATIVE NEGATIVE Final    Comment: (NOTE) The Xpert Xpress SARS-CoV-2/FLU/RSV plus assay is intended as an aid in the diagnosis of influenza from Nasopharyngeal swab specimens and should not be used as a sole basis for treatment. Nasal washings and aspirates are unacceptable for Xpert Xpress SARS-CoV-2/FLU/RSV testing.  Fact Sheet for Patients: EntrepreneurPulse.com.au  Fact Sheet for Healthcare Providers: IncredibleEmployment.be  This test is not yet approved or cleared by the [place] FDA and has been authorized for detection and/or diagnosis of SARS-CoV-2 by FDA under an Emergency Use Authorization (EUA). This EUA will remain in effect (meaning this test can be used) for the duration of the COVID-19 declaration under Section 564(b)(1) of the Act, 21 U.S.C. section 360bbb-3(b)(1), unless the authorization is terminated or revoked.  Performed at [facility],  [place] [address] [place], [place] [address]   Urine Culture     Status: Abnormal   Collection Time: 04/03/21  2:07 AM   Specimen: In/Out Cath Urine  Result Value Ref Range Status   Specimen Description IN/OUT CATH URINE  Final   Special Requests   Final    NONE Performed at [facility], [place] [address] [place], [place] [address]    Culture MULTIPLE SPECIES PRESENT, SUGGEST RECOLLECTION (A)  Final   Report Status 04/04/2021 FINAL  Final    Oswald Hillock   [facility] If 7PM-7AM, please contact night-coverage at [email or web address], Office  [phone]   04/04/2021, 12:07 PM  LOS: 1 day

## 2021-04-04 NOTE — Plan of Care (Signed)
  Problem: Education: Goal: Knowledge of General Education information will improve Description: Including pain rating scale, medication(s)/side effects and non-pharmacologic comfort measures Outcome: Progressing   Problem: Health Behavior/Discharge Planning: Goal: Ability to manage health-related needs will improve Outcome: Progressing   Problem: Clinical Measurements: Goal: Ability to maintain clinical measurements within normal limits will improve Outcome: Progressing Goal: Will remain free from infection Outcome: Progressing Goal: Diagnostic test results will improve Outcome: Progressing Goal: Respiratory complications will improve Outcome: Progressing Goal: Cardiovascular complication will be avoided Outcome: Progressing   Problem: Clinical Measurements: Goal: Will remain free from infection Outcome: Progressing   Problem: Activity: Goal: Risk for activity intolerance will decrease Outcome: Progressing   Problem: Nutrition: Goal: Adequate nutrition will be maintained Outcome: Progressing   Problem: Pain Managment: Goal: General experience of comfort will improve Outcome: Progressing   Problem: Safety: Goal: Ability to remain free from injury will improve Outcome: Progressing   Problem: Skin Integrity: Goal: Risk for impaired skin integrity will decrease Outcome: Progressing

## 2021-04-04 NOTE — Plan of Care (Signed)
  Problem: Clinical Measurements: Goal: Ability to maintain clinical measurements within normal limits will improve Outcome: Progressing Goal: Diagnostic test results will improve Outcome: Progressing Goal: Respiratory complications will improve Outcome: Progressing   Problem: Activity: Goal: Risk for activity intolerance will decrease Outcome: Progressing   Problem: Coping: Goal: Level of anxiety will decrease Outcome: Progressing

## 2021-04-04 NOTE — Progress Notes (Signed)
Dr. Darrick Meigs requested this am that we try to wean patient from oxygen. He was on 10L when came in at 0700. At this time is saturation is 92% on 5L. He can hold a 10 minute conversation without becoming short of breath. Has not been out of bed today, but PT may need do oxygen challenge to prepare for DC.

## 2021-04-05 LAB — GLUCOSE, CAPILLARY
Glucose-Capillary: 142 mg/dL — ABNORMAL HIGH (ref 70–99)
Glucose-Capillary: 131 mg/dL — ABNORMAL HIGH (ref 70–99)
Glucose-Capillary: 153 mg/dL — ABNORMAL HIGH (ref 70–99)

## 2021-04-05 MED ORDER — METHYLPREDNISOLONE SODIUM SUCC 125 MG IJ SOLR
60.0000 mg | Freq: Two times a day (BID) | INTRAMUSCULAR | Status: AC
  Administered 2021-04-05 – 2021-04-06 (×3): 60 mg via INTRAVENOUS
  Filled 2021-04-05 (×3): qty 2

## 2021-04-05 MED ORDER — ALUM & MAG HYDROXIDE-SIMETH 200-200-20 MG/5ML PO SUSP
30.0000 mL | Freq: Four times a day (QID) | ORAL | Status: DC | PRN
  Administered 2021-04-05 – 2021-04-06 (×3): 30 mL via ORAL
  Filled 2021-04-05 (×3): qty 30

## 2021-04-05 NOTE — Evaluation (Signed)
Physical Therapy Evaluation Patient Details Name: Timothy [name] MRN: [id number] DOB: [birth year] Today's Date: 04/05/2021  History of Present Illness  65  yo male wiht onset of flu and wheezing, SOB and hypoxia was admitted on 11/30 for management of exacerbation of COPD including mucus blocking of bronchii.  Cleared for PE but has acute resp failure, influenza A.  PMHx:  COPD gold 3, Home O2, HTN, HLD, CAD with stenting, DM,  Clinical Impression  Pt was seen for mobility in the room with supervised help, initially trying to walk with no O2.  Pt is unsteady and desats with no O2, requiring instead to remain on O2 on tank to avoid stressing his system.  Pt is on a lengthened line, asking to go to BR alone.  Requested nursing give him guidance on the level of permission and how to manage O2.  Follow along to get his mobility to a safer level with observation of HR and sats, and to  remain in the room for PT due to his flu precautions.   Allow pt to be up as tolerated, and is better for the next day for nursing to supervise him to see how safe he is as he regains strength and tolerance to walk. Chair alarm was used for avoidance of fall risk.     Recommendations for follow up therapy are one component of a multi-disciplinary discharge planning process, led by the attending physician.  Recommendations may be updated based on patient status, additional functional criteria and insurance authorization.  Follow Up Recommendations Home health PT    Assistance Recommended at Discharge Intermittent Supervision/Assistance  Functional Status Assessment Patient has had a recent decline in their functional status and demonstrates the ability to make significant improvements in function in a reasonable and predictable amount of time.  Equipment Recommendations  None recommended by PT    Recommendations for Other Services       Precautions / Restrictions Precautions Precautions: Other (comment)  (respiratory) Precaution Comments: monitor O2 sats Restrictions Weight Bearing Restrictions: No      Mobility  Bed Mobility Overal bed mobility: Modified Independent                  Transfers Overall transfer level: Needs assistance Equipment used: 1 person hand held assist Transfers: Sit to/from Stand Sit to Stand: Min guard           General transfer comment: min gurad to steady for LOB initial standing    Ambulation/Gait Ambulation/Gait assistance: Min guard Gait Distance (Feet): 70 Feet Assistive device: 1 person hand held assist Gait Pattern/deviations: Step-through pattern;Decreased stride length;Drifts right/left Gait velocity: reduced Gait velocity interpretation: <1.31 ft/sec, indicative of household ambulator Pre-gait activities: tested standing to see how O2 supported mobility General Gait Details: initially wobbly but after first round of walk was steadier and in control  Stairs            Wheelchair Mobility    Modified Rankin (Stroke Patients Only)       Balance Overall balance assessment: Needs assistance Sitting-balance support: Feet supported Sitting balance-Leahy Scale: Good     Standing balance support: No upper extremity supported;During functional activity Standing balance-Leahy Scale: Fair Standing balance comment: less than fair on first standing then was fair balance                             Pertinent Vitals/Pain Pain Assessment: No/denies pain    Home Living  Family/patient expects to be discharged to:: Private residence Living Arrangements: Spouse/significant other Available Help at Discharge: Family;Available PRN/intermittently Type of Home: House Home Access: Stairs to enter;Level entry       Home Layout: One level Home Equipment: Shower seat Additional Comments: has not been using AD    Prior Function Prior Level of Function : Independent/Modified Independent             Mobility  Comments: did not require home O2, ADLs Comments: I for all ADL's     Hand Dominance   Dominant Hand: Right    Extremity/Trunk Assessment   Upper Extremity Assessment Upper Extremity Assessment: Overall WFL for tasks assessed    Lower Extremity Assessment Lower Extremity Assessment: Overall WFL for tasks assessed    Cervical / Trunk Assessment Cervical / Trunk Assessment: Normal  Communication   Communication: No difficulties  Cognition Arousal/Alertness: Awake/alert Behavior During Therapy: WFL for tasks assessed/performed Overall Cognitive Status: Within Functional Limits for tasks assessed                                          General Comments General comments (skin integrity, edema, etc.): pt was assisted to walk with help and by end of session could walk with lateral balance control but requires O2    Exercises     Assessment/Plan    PT Assessment Patient needs continued PT services  PT Problem List Cardiopulmonary status limiting activity;Decreased activity tolerance       PT Treatment Interventions Gait training;Stair training;Functional mobility training;Therapeutic activities;Therapeutic exercise;Balance training;Neuromuscular re-education;Cognitive remediation;Wheelchair mobility training    PT Goals (Current goals can be found in the Care Plan section)  Acute Rehab PT Goals Patient Stated Goal: to be able to walk to BR alone PT Goal Formulation: With patient Time For Goal Achievement: 04/12/21 Potential to Achieve Goals: Good    Frequency Min 3X/week   Barriers to discharge Decreased caregiver support home alone at times    Co-evaluation               AM-PAC PT "6 Clicks" Mobility  Outcome Measure Help needed turning from your back to your side while in a flat bed without using bedrails?: None Help needed moving from lying on your back to sitting on the side of a flat bed without using bedrails?: A Little Help needed  moving to and from a bed to a chair (including a wheelchair)?: A Little Help needed standing up from a chair using your arms (e.g., wheelchair or bedside chair)?: A Little Help needed to walk in hospital room?: A Little Help needed climbing 3-5 steps with a railing? : A Lot 6 Click Score: 18    End of Session Equipment Utilized During Treatment: Gait belt;Oxygen Activity Tolerance: Patient limited by fatigue;Treatment limited secondary to medical complications (Comment) Patient left: in chair;with call bell/phone within reach;with chair alarm set Nurse Communication: Mobility status;Other (comment) (need for O2) PT Visit Diagnosis: Muscle weakness (generalized) (M62.81);Other abnormalities of gait and mobility (R26.89);Unsteadiness on feet (R26.81)    Time: 5784-6962 PT Time Calculation (min) (ACUTE ONLY): 29 min   Charges:   PT Evaluation $PT Eval Moderate Complexity: 1 Mod PT Treatments $Gait Training: 8-22 mins       Ramond Dial 04/05/2021, 8:43 PM  Mee Hives, PT PhD Acute Rehab Dept. Number: [place] [facility] [place]

## 2021-04-05 NOTE — Progress Notes (Signed)
PROGRESS NOTE    Timothy [name]  WUJ:[id number] DOB: 1956-04-21 DOA: 04/02/2021 PCP: Ladell Pier, MD    Brief Narrative:  65 year old male with medical history of COPD, Gold stage III on as needed oxygen at home, hypertension, hyperlipidemia, CAD with stenting in 2021, diabetes mellitus type 2 presented with worsening cough, wheezing and shortness of breath.  Patient states that symptoms started 3 to 4 days ago, gradually getting worse, minimum activity triggered cough and shortness of breath.   CT angiogram was negative for pulmonary embolism   Assessment & Plan:  Acute respiratory failure with hypoxemia History of COPD on oxygen at home to 3 L nasal cannula as needed as needed On Solu-Medrol 90 mg IV every 12 hours will change to 60 mg every 12 hours Continue DuoNebs every 6 hour scheduled Mucinex at 600 mg p.o. twice daily Flutter valve every 4 hours  Influenza A Tamiflu  Hypertension Continue on bisoprolol  History of coronary artery disease status post stenting continue aspirin and Brilinta Lipitor and bisoprolol  Diabetes mellitus type 2 Insulin sliding scale for sensitivity factor P.o. medication metformin was on hold Because IV contrast for CT chest   Principal Problem:   COPD (chronic obstructive pulmonary disease) (HCC) Active Problems:   COPD GOLD III   COPD exacerbation (HCC)      DVT prophylaxis: Lovenox f  Code Status: (Full code Family Communication: No family around Disposition Plan: Discharge home tomorrow or Monday   Consultants:      Subjective: Shortness of breath improved Patient changed from high flow oxygen to nasal cannula 6 L Apparently tolerates okay Is on oxygen at home as needed  Objective: Vitals:   04/04/21 2044 04/04/21 2208 04/05/21 0651 04/05/21 1346  BP:  117/81 117/84 128/80  Pulse:  96 77 96  Resp:  20 19 17   Temp:  97.9 F (36.6 C) 97.6 F (36.4 C) (!) 97.5 F (36.4 C)  TempSrc:  Oral Oral Oral   SpO2: 93% 92% 98% 98%  Height:       No intake or output data in the 24 hours ending 04/05/21 1632 There were no vitals filed for this visit.  Examination:  General exam: Appears calm and comfortable  Respiratory system: Diminished breath sounds some wheezing. Respiratory effort normal. Cardiovascular system: S1 & S2 heard, RRR. No JVD, murmurs, rubs, gallops or clicks. No pedal edema. Gastrointestinal system: Abdomen is nondistended, soft and nontender. No organomegaly or masses felt. Normal bowel sounds heard. Central nervous system: Alert and oriented. No focal neurological deficits. Extremities: Symmetric 5 x 5 power. Skin: No rashes, lesions or ulcers Psychiatry: Judgement and insight appear normal. Mood & affect appropriate.     Data Reviewed: I have personally reviewed following labs and imaging studies  CBC: Recent Labs  Lab 04/02/21 0125 04/02/21 0202 04/03/21 0225 04/03/21 0418 04/04/21 0339  WBC 20.1*  --   --  16.1* 10.9*  NEUTROABS 16.5*  --   --   --   --   HGB 17.1* 17.3* 16.3 15.5 14.9  HCT 51.4 51.0 48.0 47.3 44.8  MCV 93.8  --   --  93.3 92.2  PLT 328  --   --  301 956   Basic Metabolic Panel: Recent Labs  Lab 04/02/21 0125 04/02/21 0202 04/03/21 0225 04/04/21 0339  NA 136 139 137 134*  K 3.9 4.0 3.9 4.3  CL 102  --   --  101  CO2 25  --   --  25  GLUCOSE 104*  --   --  137*  BUN 13  --   --  24*  CREATININE 0.96  --   --  0.81  CALCIUM 9.3  --   --  8.6*   GFR: CrCl cannot be calculated (Unknown ideal weight.). Liver Function Tests: No results for input(s): AST, ALT, ALKPHOS, BILITOT, PROT, ALBUMIN in the last 168 hours. No results for input(s): LIPASE, AMYLASE in the last 168 hours. No results for input(s): AMMONIA in the last 168 hours. Coagulation Profile: Recent Labs  Lab 04/02/21 1342  INR 1.0   Cardiac Enzymes: No results for input(s): CKTOTAL, CKMB, CKMBINDEX, TROPONINI in the last 168 hours. BNP (last 3 results) Recent  Labs    05/09/20 1648  PROBNP 29.0   HbA1C: Recent Labs    04/02/21 2043  HGBA1C 5.9*   CBG: Recent Labs  Lab 04/04/21 0523 04/04/21 1813 04/04/21 2204 04/05/21 0833 04/05/21 1210  GLUCAP 132* 151* 139* 142* 131*   Lipid Profile: No results for input(s): CHOL, HDL, LDLCALC, TRIG, CHOLHDL, LDLDIRECT in the last 72 hours. Thyroid Function Tests: No results for input(s): TSH, T4TOTAL, FREET4, T3FREE, THYROIDAB in the last 72 hours. Anemia Panel: No results for input(s): VITAMINB12, FOLATE, FERRITIN, TIBC, IRON, RETICCTPCT in the last 72 hours. Sepsis Labs: Recent Labs  Lab 04/02/21 1342 04/02/21 1616 04/03/21 0418 04/04/21 0339  PROCALCITON  --   --  0.14 2.58  LATICACIDVEN 4.1* 3.6*  --   --     Recent Results (from the past 240 hour(s))  Blood Culture (routine x 2)     Status: None (Preliminary result)   Collection Time: 04/02/21  1:42 PM   Specimen: BLOOD  Result Value Ref Range Status   Specimen Description BLOOD SITE NOT SPECIFIED  Final   Special Requests   Final    BOTTLES DRAWN AEROBIC AND ANAEROBIC Blood Culture adequate volume   Culture   Final    NO GROWTH 3 DAYS Performed at [facility], [place] [address] [place], [place] [address]    Report Status PENDING  Incomplete  Blood Culture (routine x 2)     Status: None (Preliminary result)   Collection Time: 04/02/21  2:45 PM   Specimen: BLOOD  Result Value Ref Range Status   Specimen Description BLOOD RIGHT ANTECUBITAL  Final   Special Requests   Final    BOTTLES DRAWN AEROBIC AND ANAEROBIC Blood Culture results may not be optimal due to an inadequate volume of blood received in culture bottles   Culture   Final    NO GROWTH 3 DAYS Performed at [facility], [place] [address] [place], [place] [address]    Report Status PENDING  Incomplete  Resp Panel by RT-PCR (Flu A&B, Covid) Nasopharyngeal Swab     Status: Abnormal   Collection Time: 04/02/21  4:16 PM   Specimen: Nasopharyngeal Swab;  Nasopharyngeal(NP) swabs in vial transport medium  Result Value Ref Range Status   SARS Coronavirus 2 by RT PCR NEGATIVE NEGATIVE Final    Comment: (NOTE) SARS-CoV-2 target nucleic acids are NOT DETECTED.  The SARS-CoV-2 RNA is generally detectable in upper respiratory specimens during the acute phase of infection. The lowest concentration of SARS-CoV-2 viral copies this assay can detect is 138 copies/mL. A negative result does not preclude SARS-Cov-2 infection and should not be used as the sole basis for treatment or other patient management decisions. A negative result may occur with  improper specimen collection/handling,  submission of specimen other than nasopharyngeal swab, presence of viral mutation(s) within the areas targeted by this assay, and inadequate number of viral copies(<138 copies/mL). A negative result must be combined with clinical observations, patient history, and epidemiological information. The expected result is Negative.  Fact Sheet for Patients:  EntrepreneurPulse.com.au  Fact Sheet for Healthcare Providers:  IncredibleEmployment.be  This test is no t yet approved or cleared by the [place] FDA and  has been authorized for detection and/or diagnosis of SARS-CoV-2 by FDA under an Emergency Use Authorization (EUA). This EUA will remain  in effect (meaning this test can be used) for the duration of the COVID-19 declaration under Section 564(b)(1) of the Act, 21 U.S.C.section 360bbb-3(b)(1), unless the authorization is terminated  or revoked sooner.       Influenza A by PCR POSITIVE (A) NEGATIVE Final   Influenza B by PCR NEGATIVE NEGATIVE Final    Comment: (NOTE) The Xpert Xpress SARS-CoV-2/FLU/RSV plus assay is intended as an aid in the diagnosis of influenza from Nasopharyngeal swab specimens and should not be used as a sole basis for treatment. Nasal washings and aspirates are unacceptable for Xpert Xpress  SARS-CoV-2/FLU/RSV testing.  Fact Sheet for Patients: EntrepreneurPulse.com.au  Fact Sheet for Healthcare Providers: IncredibleEmployment.be  This test is not yet approved or cleared by the [place] FDA and has been authorized for detection and/or diagnosis of SARS-CoV-2 by FDA under an Emergency Use Authorization (EUA). This EUA will remain in effect (meaning this test can be used) for the duration of the COVID-19 declaration under Section 564(b)(1) of the Act, 21 U.S.C. section 360bbb-3(b)(1), unless the authorization is terminated or revoked.  Performed at [facility], [place] [address] [place], [place] [address]   Urine Culture     Status: Abnormal   Collection Time: 04/03/21  2:07 AM   Specimen: In/Out Cath Urine  Result Value Ref Range Status   Specimen Description IN/OUT CATH URINE  Final   Special Requests   Final    NONE Performed at [facility], [place] [address], [place], [place] [address]    Culture MULTIPLE SPECIES PRESENT, SUGGEST RECOLLECTION (A)  Final   Report Status 04/04/2021 FINAL  Final         Radiology Studies: No results found.      Scheduled Meds:  aspirin EC  81 mg Oral Once per day on Mon Thu   atorvastatin  80 mg Oral Daily   bisoprolol  2.5 mg Oral QHS   Budeson-Glycopyrrol-Formoterol  2 puff Inhalation BID   enoxaparin (LOVENOX) injection  40 mg Subcutaneous Q24H   guaiFENesin  600 mg Oral BID   insulin aspart  0-15 Units Subcutaneous TID WC   ipratropium-albuterol  3 mL Nebulization BID   levofloxacin  750 mg Oral Daily   methylPREDNISolone (SOLU-MEDROL) injection  60 mg Intravenous Q12H   methylPREDNISolone (SOLU-MEDROL) injection  90 mg Intravenous Q12H   oseltamivir  75 mg Oral BID   ticagrelor  90 mg Oral BID   Continuous Infusions:   LOS: 2 days    Time spent: More than 35 minutes    Catricia Scheerer G Jermon Chalfant, MD [facility]   If 7PM-7AM, please contact  night-coverage [email or web address] Password [facility] 04/05/2021, 4:32 PM

## 2021-04-06 ENCOUNTER — Encounter (HOSPITAL_COMMUNITY): Payer: Self-pay | Admitting: Family Medicine

## 2021-04-06 DIAGNOSIS — J439 Emphysema, unspecified: Secondary | ICD-10-CM | POA: Diagnosis not present

## 2021-04-06 LAB — COMPREHENSIVE METABOLIC PANEL
ALT: 32 U/L (ref 0–44)
AST: 24 U/L (ref 15–41)
Albumin: 3.4 g/dL — ABNORMAL LOW (ref 3.5–5.0)
Alkaline Phosphatase: 63 U/L (ref 38–126)
Anion gap: 10 (ref 5–15)
BUN: 25 mg/dL — ABNORMAL HIGH (ref 8–23)
CO2: 26 mmol/L (ref 22–32)
Calcium: 9.3 mg/dL (ref 8.9–10.3)
Chloride: 97 mmol/L — ABNORMAL LOW (ref 98–111)
Creatinine, Ser: 0.86 mg/dL (ref 0.61–1.24)
GFR, Estimated: >60 mL/min (ref >60)
Glucose, Bld: 146 mg/dL — ABNORMAL HIGH (ref 70–99)
Potassium: 5 mmol/L (ref 3.5–5.1)
Sodium: 133 mmol/L — ABNORMAL LOW (ref 135–145)
Total Bilirubin: 0.8 mg/dL (ref 0.3–1.2)
Total Protein: 6.9 g/dL (ref 6.5–8.1)

## 2021-04-06 LAB — GLUCOSE, CAPILLARY
Glucose-Capillary: 131 mg/dL — ABNORMAL HIGH (ref 70–99)
Glucose-Capillary: 109 mg/dL — ABNORMAL HIGH (ref 70–99)
Glucose-Capillary: 129 mg/dL — ABNORMAL HIGH (ref 70–99)
Glucose-Capillary: 151 mg/dL — ABNORMAL HIGH (ref 70–99)

## 2021-04-06 LAB — CBC
HCT: 50.2 % (ref 39.0–52.0)
Hemoglobin: 17 g/dL (ref 13.0–17.0)
MCH: 30.9 pg (ref 26.0–34.0)
MCHC: 33.9 g/dL (ref 30.0–36.0)
MCV: 91.1 fL (ref 80.0–100.0)
Platelets: 412 10*3/uL — ABNORMAL HIGH (ref 150–400)
RBC: 5.51 MIL/uL (ref 4.22–5.81)
RDW: 13.4 % (ref 11.5–15.5)
WBC: 18.2 10*3/uL — ABNORMAL HIGH (ref 4.0–10.5)
nRBC: 0 % (ref 0.0–0.2)

## 2021-04-06 MED ORDER — SODIUM CHLORIDE 0.9 % IV SOLN: INTRAVENOUS | Status: DC

## 2021-04-06 MED ORDER — METHYLPREDNISOLONE SODIUM SUCC 40 MG IJ SOLR
40.0000 mg | Freq: Two times a day (BID) | INTRAMUSCULAR | Status: DC
  Administered 2021-04-07 – 2021-04-09 (×5): 40 mg via INTRAVENOUS
  Filled 2021-04-06 (×5): qty 1

## 2021-04-06 NOTE — Plan of Care (Signed)

## 2021-04-06 NOTE — Plan of Care (Signed)

## 2021-04-06 NOTE — Progress Notes (Signed)
PROGRESS NOTE    Timothy [name]  VFI:[id number] DOB: 13-Apr-1956 DOA: 04/02/2021 PCP: Ladell Pier, MD     Brief Narrative:  65 year old male with medical history of COPD, Gold stage III on as needed oxygen at home, hypertension, hyperlipidemia, CAD with stenting in 2021, diabetes mellitus type 2 presented with worsening cough, wheezing and shortness of breath.  Patient states that symptoms started 3 to 4 days ago, gradually getting worse, minimum activity triggered cough and shortness of breath.   CT angiogram was negative for pulmonary embolism  Assessment & Plan:  Acute respiratory failure with hypoxemia History of COPD on oxygen at home to 3 L nasal cannula as needed as needed On Solu-Medrol 90 mg IV every 12 hours will change to 60 mg every 12 hours Continue DuoNebs every 6 hour scheduled Mucinex at 600 mg p.o. twice daily Flutter valve every 4 hours Will change Solu-Medrol to 40 mg twice daily starting tomorrow Continue with Levaquin daily for 5 days   Influenza A Tamiflu   Hypertension Continue on bisoprolol   History of coronary artery disease status post stenting continue aspirin and Brilinta Lipitor and bisoprolol   Diabetes mellitus type 2 Insulin sliding scale for sensitivity factor P.o. medication metformin was on hold Because IV contrast for CT chest     Principal Problem:   COPD (chronic obstructive pulmonary disease) (HCC) Active Problems:   COPD GOLD III   COPD exacerbation (HCC)   ,   Antimicrobials:     Subjective: Doing better on 6 L nasal cannula Still very weak to walk Evaluated by physical therapist  Objective: Vitals:   04/05/21 2205 04/06/21 0643 04/06/21 0808 04/06/21 0834  BP: (!) 148/84 135/78  122/74  Pulse: 82 77  89  Resp: 18 19  18   Temp: (!) 97.5 F (36.4 C) 97.6 F (36.4 C)  98.3 F (36.8 C)  TempSrc: Oral   Oral  SpO2: 95% 94% 94% 98%  Weight:      Height:        Intake/Output Summary (Last 24 hours) at  04/06/2021 1603 Last data filed at 04/06/2021 0425 Gross per 24 hour  Intake --  Output 500 ml  Net -500 ml   Filed Weights   04/05/21 1700  Weight: 56.4 kg    Examination:  General exam: Appears calm and comfortable  Respiratory system: Clear to auscultation. Respiratory effort normal. Cardiovascular system: S1 & S2 heard, RRR. No JVD, murmurs, rubs, gallops or clicks. No pedal edema. Gastrointestinal system: Abdomen is nondistended, soft and nontender. No organomegaly or masses felt. Normal bowel sounds heard. Central nervous system: Alert and oriented. No focal neurological deficits. Extremities: Symmetric 5 x 5 power. Skin: No rashes, lesions or ulcers Psychiatry: Judgement and insight appear normal. Mood & affect appropriate.     Data Reviewed: I have personally reviewed following labs and imaging studies  CBC: Recent Labs  Lab 04/02/21 0125 04/02/21 0202 04/03/21 0225 04/03/21 0418 04/04/21 0339 04/06/21 0950  WBC 20.1*  --   --  16.1* 10.9* 18.2*  NEUTROABS 16.5*  --   --   --   --   --   HGB 17.1* 17.3* 16.3 15.5 14.9 17.0  HCT 51.4 51.0 48.0 47.3 44.8 50.2  MCV 93.8  --   --  93.3 92.2 91.1  PLT 328  --   --  301 298 416*   Basic Metabolic Panel: Recent Labs  Lab 04/02/21 0125 04/02/21 0202 04/03/21 0225 04/04/21 0339 04/06/21 0950  NA 136 139 137 134* 133*  K 3.9 4.0 3.9 4.3 5.0  CL 102  --   --  101 97*  CO2 25  --   --  25 26  GLUCOSE 104*  --   --  137* 146*  BUN 13  --   --  24* 25*  CREATININE 0.96  --   --  0.81 0.86  CALCIUM 9.3  --   --  8.6* 9.3   GFR: Estimated Creatinine Clearance: 69.2 mL/min (by C-G formula based on SCr of 0.86 mg/dL). Liver Function Tests: Recent Labs  Lab 04/06/21 0950  AST 24  ALT 32  ALKPHOS 63  BILITOT 0.8  PROT 6.9  ALBUMIN 3.4*   No results for input(s): LIPASE, AMYLASE in the last 168 hours. No results for input(s): AMMONIA in the last 168 hours. Coagulation Profile: Recent Labs  Lab  04/02/21 1342  INR 1.0   Cardiac Enzymes: No results for input(s): CKTOTAL, CKMB, CKMBINDEX, TROPONINI in the last 168 hours. BNP (last 3 results) Recent Labs    05/09/20 1648  PROBNP 29.0   HbA1C: No results for input(s): HGBA1C in the last 72 hours. CBG: Recent Labs  Lab 04/05/21 0833 04/05/21 1210 04/05/21 1637 04/06/21 0832 04/06/21 1202  GLUCAP 142* 131* 153* 131* 109*   Lipid Profile: No results for input(s): CHOL, HDL, LDLCALC, TRIG, CHOLHDL, LDLDIRECT in the last 72 hours. Thyroid Function Tests: No results for input(s): TSH, T4TOTAL, FREET4, T3FREE, THYROIDAB in the last 72 hours. Anemia Panel: No results for input(s): VITAMINB12, FOLATE, FERRITIN, TIBC, IRON, RETICCTPCT in the last 72 hours. Sepsis Labs: Recent Labs  Lab 04/02/21 1342 04/02/21 1616 04/03/21 0418 04/04/21 0339  PROCALCITON  --   --  0.14 2.58  LATICACIDVEN 4.1* 3.6*  --   --     Recent Results (from the past 240 hour(s))  Blood Culture (routine x 2)     Status: None (Preliminary result)   Collection Time: 04/02/21  1:42 PM   Specimen: BLOOD  Result Value Ref Range Status   Specimen Description BLOOD SITE NOT SPECIFIED  Final   Special Requests   Final    BOTTLES DRAWN AEROBIC AND ANAEROBIC Blood Culture adequate volume   Culture   Final    NO GROWTH 4 DAYS Performed at [facility], [place] [address], [place], [place] [address]    Report Status PENDING  Incomplete  Blood Culture (routine x 2)     Status: None (Preliminary result)   Collection Time: 04/02/21  2:45 PM   Specimen: BLOOD  Result Value Ref Range Status   Specimen Description BLOOD RIGHT ANTECUBITAL  Final   Special Requests   Final    BOTTLES DRAWN AEROBIC AND ANAEROBIC Blood Culture results may not be optimal due to an inadequate volume of blood received in culture bottles   Culture   Final    NO GROWTH 4 DAYS Performed at [facility], [place] [address] [place], [place] [address]    Report Status PENDING   Incomplete  Resp Panel by RT-PCR (Flu A&B, Covid) Nasopharyngeal Swab     Status: Abnormal   Collection Time: 04/02/21  4:16 PM   Specimen: Nasopharyngeal Swab; Nasopharyngeal(NP) swabs in vial transport medium  Result Value Ref Range Status   SARS Coronavirus 2 by RT PCR NEGATIVE NEGATIVE Final    Comment: (NOTE) SARS-CoV-2 target nucleic acids are NOT DETECTED.  The SARS-CoV-2 RNA is generally detectable in upper respiratory specimens  during the acute phase of infection. The lowest concentration of SARS-CoV-2 viral copies this assay can detect is 138 copies/mL. A negative result does not preclude SARS-Cov-2 infection and should not be used as the sole basis for treatment or other patient management decisions. A negative result may occur with  improper specimen collection/handling, submission of specimen other than nasopharyngeal swab, presence of viral mutation(s) within the areas targeted by this assay, and inadequate number of viral copies(<138 copies/mL). A negative result must be combined with clinical observations, patient history, and epidemiological information. The expected result is Negative.  Fact Sheet for Patients:  EntrepreneurPulse.com.au  Fact Sheet for Healthcare Providers:  IncredibleEmployment.be  This test is no t yet approved or cleared by the [place] FDA and  has been authorized for detection and/or diagnosis of SARS-CoV-2 by FDA under an Emergency Use Authorization (EUA). This EUA will remain  in effect (meaning this test can be used) for the duration of the COVID-19 declaration under Section 564(b)(1) of the Act, 21 U.S.C.section 360bbb-3(b)(1), unless the authorization is terminated  or revoked sooner.       Influenza A by PCR POSITIVE (A) NEGATIVE Final   Influenza B by PCR NEGATIVE NEGATIVE Final    Comment: (NOTE) The Xpert Xpress SARS-CoV-2/FLU/RSV plus assay is intended as an aid in the diagnosis of  influenza from Nasopharyngeal swab specimens and should not be used as a sole basis for treatment. Nasal washings and aspirates are unacceptable for Xpert Xpress SARS-CoV-2/FLU/RSV testing.  Fact Sheet for Patients: EntrepreneurPulse.com.au  Fact Sheet for Healthcare Providers: IncredibleEmployment.be  This test is not yet approved or cleared by the [place] FDA and has been authorized for detection and/or diagnosis of SARS-CoV-2 by FDA under an Emergency Use Authorization (EUA). This EUA will remain in effect (meaning this test can be used) for the duration of the COVID-19 declaration under Section 564(b)(1) of the Act, 21 U.S.C. section 360bbb-3(b)(1), unless the authorization is terminated or revoked.  Performed at [facility], [place] [address] [place], [place] [address]   Urine Culture     Status: Abnormal   Collection Time: 04/03/21  2:07 AM   Specimen: In/Out Cath Urine  Result Value Ref Range Status   Specimen Description IN/OUT CATH URINE  Final   Special Requests   Final    NONE Performed at [facility], [place] [address] [place], [place] [address]    Culture MULTIPLE SPECIES PRESENT, SUGGEST RECOLLECTION (A)  Final   Report Status 04/04/2021 FINAL  Final         Radiology Studies: No results found.      Scheduled Meds:  aspirin EC  81 mg Oral Once per day on Mon Thu   atorvastatin  80 mg Oral Daily   bisoprolol  2.5 mg Oral QHS   Budeson-Glycopyrrol-Formoterol  2 puff Inhalation BID   enoxaparin (LOVENOX) injection  40 mg Subcutaneous Q24H   guaiFENesin  600 mg Oral BID   insulin aspart  0-15 Units Subcutaneous TID WC   ipratropium-albuterol  3 mL Nebulization BID   levofloxacin  750 mg Oral Daily   methylPREDNISolone (SOLU-MEDROL) injection  60 mg Intravenous Q12H   oseltamivir  75 mg Oral BID   ticagrelor  90 mg Oral BID   Continuous Infusions:   LOS: 3 days    Time spent: More than 35  minutes    Assunta Found, MD [facility] Pager [phone] If 7PM-7AM, please contact night-coverage [email or web address] Password [facility] 04/06/2021,  4:03 PM

## 2021-04-07 DIAGNOSIS — J441 Chronic obstructive pulmonary disease with (acute) exacerbation: Principal | ICD-10-CM

## 2021-04-07 DIAGNOSIS — I10 Essential (primary) hypertension: Secondary | ICD-10-CM

## 2021-04-07 DIAGNOSIS — J449 Chronic obstructive pulmonary disease, unspecified: Secondary | ICD-10-CM

## 2021-04-07 DIAGNOSIS — J09X2 Influenza due to identified novel influenza A virus with other respiratory manifestations: Secondary | ICD-10-CM

## 2021-04-07 DIAGNOSIS — Z8679 Personal history of other diseases of the circulatory system: Secondary | ICD-10-CM

## 2021-04-07 DIAGNOSIS — J9621 Acute and chronic respiratory failure with hypoxia: Secondary | ICD-10-CM

## 2021-04-07 DIAGNOSIS — J439 Emphysema, unspecified: Secondary | ICD-10-CM | POA: Diagnosis not present

## 2021-04-07 DIAGNOSIS — E119 Type 2 diabetes mellitus without complications: Secondary | ICD-10-CM

## 2021-04-07 LAB — BASIC METABOLIC PANEL
Anion gap: 7 (ref 5–15)
BUN: 21 mg/dL (ref 8–23)
CO2: 27 mmol/L (ref 22–32)
Calcium: 8.9 mg/dL (ref 8.9–10.3)
Chloride: 99 mmol/L (ref 98–111)
Creatinine, Ser: 0.84 mg/dL (ref 0.61–1.24)
GFR, Estimated: >60 mL/min (ref >60)
Glucose, Bld: 120 mg/dL — ABNORMAL HIGH (ref 70–99)
Potassium: 4.5 mmol/L (ref 3.5–5.1)
Sodium: 133 mmol/L — ABNORMAL LOW (ref 135–145)

## 2021-04-07 LAB — CBC WITH DIFFERENTIAL/PLATELET
Abs Immature Granulocytes: 0.22 10*3/uL — ABNORMAL HIGH (ref 0.00–0.07)
Basophils Absolute: 0 10*3/uL (ref 0.0–0.1)
Basophils Relative: 0 %
Eosinophils Absolute: 0 10*3/uL (ref 0.0–0.5)
Eosinophils Relative: 0 %
HCT: 49.3 % (ref 39.0–52.0)
Hemoglobin: 16.6 g/dL (ref 13.0–17.0)
Immature Granulocytes: 1 %
Lymphocytes Relative: 7 %
Lymphs Abs: 1.2 10*3/uL (ref 0.7–4.0)
MCH: 30.8 pg (ref 26.0–34.0)
MCHC: 33.7 g/dL (ref 30.0–36.0)
MCV: 91.5 fL (ref 80.0–100.0)
Monocytes Absolute: 2.4 10*3/uL — ABNORMAL HIGH (ref 0.1–1.0)
Monocytes Relative: 14 %
Neutro Abs: 13.8 10*3/uL — ABNORMAL HIGH (ref 1.7–7.7)
Neutrophils Relative %: 78 %
Platelets: 383 10*3/uL (ref 150–400)
RBC: 5.39 MIL/uL (ref 4.22–5.81)
RDW: 13.2 % (ref 11.5–15.5)
WBC: 17.7 10*3/uL — ABNORMAL HIGH (ref 4.0–10.5)
nRBC: 0 % (ref 0.0–0.2)

## 2021-04-07 LAB — GLUCOSE, CAPILLARY
Glucose-Capillary: 156 mg/dL — ABNORMAL HIGH (ref 70–99)
Glucose-Capillary: 107 mg/dL — ABNORMAL HIGH (ref 70–99)
Glucose-Capillary: 107 mg/dL — ABNORMAL HIGH (ref 70–99)
Glucose-Capillary: 155 mg/dL — ABNORMAL HIGH (ref 70–99)

## 2021-04-07 LAB — CULTURE, BLOOD (ROUTINE X 2)
Culture: NO GROWTH
Culture: NO GROWTH
Special Requests: ADEQUATE

## 2021-04-07 LAB — PROCALCITONIN: Procalcitonin: <0.1 ng/mL

## 2021-04-07 LAB — MAGNESIUM: Magnesium: 2.3 mg/dL (ref 1.7–2.4)

## 2021-04-07 NOTE — Progress Notes (Signed)
PROGRESS NOTE    Timothy [name]  DPO:[id number] DOB: 1956-02-13 DOA: 04/02/2021 PCP: Ladell Pier, MD   Brief Narrative:  65 year old male with medical history of COPD, Gold stage III on as needed oxygen at home, hypertension, hyperlipidemia, CAD with stenting in 2021, diabetes mellitus type 2 presented with worsening cough, wheezing and shortness of breath.  Patient states that symptoms started 3 to 4 days ago, gradually getting worse, minimum activity triggered cough and shortness of breath.   CT angiogram was negative for pulmonary embolism  Assessment & Plan:   Principal Problem:   COPD (chronic obstructive pulmonary disease) (HCC) Active Problems:   COPD GOLD III   COPD exacerbation (HCC)  Acute on chronic hypoxic respiratory failure secondary to COPD exacerbation and influenza A infection: Patient uses 2 to 3 L oxygen at home only as needed basis and currently he is on 4 L and he still feels shortness of breath with no improvement compared to yesterday, has diffuse but mild wheezes bilaterally.  We will continue current dose of Solu-Medrol, bronchodilators and antibiotics.  Try to wean oxygen.  Ordered incentive spirometry.  Continue Tamiflu.  Essential hypertension: Controlled.  Continue bisoprolol.  History of CAD: Continue aspirin, bisoprolol, Lipitor and Brilinta, he is asymptomatic.  Type 2 diabetes mellitus: Blood sugar controlled on SSI.  DVT prophylaxis: enoxaparin (LOVENOX) injection 40 mg Start: 04/02/21 1742   Code Status: Full Code  Family Communication:  None present at bedside.  Plan of care discussed with patient in length and he verbalized understanding and agreed with it.  Status is: Inpatient  Remains inpatient appropriate because: Needs inpatient management for acute COPD exacerbation.   Estimated body mass index is 20.07 kg/m as calculated from the following:   Height as of this encounter: 5\' 6"  (1.676 m).   Weight as of this encounter: 56.4  kg.  Nutritional Assessment: Body mass index is 20.07 kg/m.Marland Kitchen Seen by dietician.  I agree with the assessment and plan as outlined below: Nutrition Status:   Skin Assessment: I have examined the patient's skin and I agree with the wound assessment as performed by the wound care RN as outlined below:   Consultants:  None  Procedures:  None  Antimicrobials:  Anti-infectives (From admission, onward)    Start     Dose/Rate Route Frequency Ordered Stop   04/04/21 0000  azithromycin (ZITHROMAX) tablet 500 mg  Status:  Discontinued       See Hyperspace for full Linked Orders Report.   500 mg Oral Daily 04/02/21 1637 04/02/21 1637   04/03/21 1800  levofloxacin (LEVAQUIN) tablet 750 mg        750 mg Oral Daily 04/02/21 1640     04/03/21 1345  oseltamivir (TAMIFLU) capsule 75 mg        75 mg Oral 2 times daily 04/03/21 1339 04/08/21 0959   04/03/21 1000  azithromycin (ZITHROMAX) tablet 250 mg  Status:  Discontinued        250 mg Oral Daily 04/02/21 1638 04/02/21 1640   04/03/21 0000  azithromycin (ZITHROMAX) 500 mg in sodium chloride 0.9 % 250 mL IVPB  Status:  Discontinued       See Hyperspace for full Linked Orders Report.   500 mg 250 mL/hr over 60 Minutes Intravenous Every 24 hours 04/02/21 1637 04/02/21 1638   04/02/21 1530  cefTRIAXone (ROCEPHIN) 1 g in sodium chloride 0.9 % 100 mL IVPB        1 g 200 mL/hr over 30 Minutes  Intravenous  Once 04/02/21 1515 04/02/21 1610   04/02/21 1530  azithromycin (ZITHROMAX) 500 mg in sodium chloride 0.9 % 250 mL IVPB        500 mg 250 mL/hr over 60 Minutes Intravenous  Once 04/02/21 1515 04/02/21 1714          Subjective: Seen and examined.  Still feels shortness of breath with no improvement.  No other complaint.  Objective: Vitals:   04/06/21 2100 04/07/21 0427 04/07/21 0742 04/07/21 0749  BP: 117/72 128/78 135/74   Pulse: 84 80 89 91  Resp: 18 18 16 18   Temp: 97.8 F (36.6 C) 97.9 F (36.6 C) (!) 97.2 F (36.2 C)   TempSrc:  Oral Oral Oral   SpO2: 98% 98% 94% 94%  Weight:      Height:        Intake/Output Summary (Last 24 hours) at 04/07/2021 1122 Last data filed at 04/07/2021 0745 Gross per 24 hour  Intake 1091.25 ml  Output 1500 ml  Net -408.75 ml   Filed Weights   04/05/21 1700  Weight: 56.4 kg    Examination:  General exam: Appears calm and comfortable  Respiratory system: Diffuse mild expiratory wheezes bilaterally. Respiratory effort normal. Cardiovascular system: S1 & S2 heard, RRR. No JVD, murmurs, rubs, gallops or clicks. No pedal edema. Gastrointestinal system: Abdomen is nondistended, soft and nontender. No organomegaly or masses felt. Normal bowel sounds heard. Central nervous system: Alert and oriented. No focal neurological deficits. Extremities: Symmetric 5 x 5 power. Skin: No rashes, lesions or ulcers Psychiatry: Judgement and insight appear normal. Mood & affect appropriate.    Data Reviewed: I have personally reviewed following labs and imaging studies  CBC: Recent Labs  Lab 04/02/21 0125 04/02/21 0202 04/03/21 0225 04/03/21 0418 04/04/21 0339 04/06/21 0950  WBC 20.1*  --   --  16.1* 10.9* 18.2*  NEUTROABS 16.5*  --   --   --   --   --   HGB 17.1* 17.3* 16.3 15.5 14.9 17.0  HCT 51.4 51.0 48.0 47.3 44.8 50.2  MCV 93.8  --   --  93.3 92.2 91.1  PLT 328  --   --  301 298 646*   Basic Metabolic Panel: Recent Labs  Lab 04/02/21 0125 04/02/21 0202 04/03/21 0225 04/04/21 0339 04/06/21 0950  NA 136 139 137 134* 133*  K 3.9 4.0 3.9 4.3 5.0  CL 102  --   --  101 97*  CO2 25  --   --  25 26  GLUCOSE 104*  --   --  137* 146*  BUN 13  --   --  24* 25*  CREATININE 0.96  --   --  0.81 0.86  CALCIUM 9.3  --   --  8.6* 9.3   GFR: Estimated Creatinine Clearance: 69.2 mL/min (by C-G formula based on SCr of 0.86 mg/dL). Liver Function Tests: Recent Labs  Lab 04/06/21 0950  AST 24  ALT 32  ALKPHOS 63  BILITOT 0.8  PROT 6.9  ALBUMIN 3.4*   No results for input(s):  LIPASE, AMYLASE in the last 168 hours. No results for input(s): AMMONIA in the last 168 hours. Coagulation Profile: Recent Labs  Lab 04/02/21 1342  INR 1.0   Cardiac Enzymes: No results for input(s): CKTOTAL, CKMB, CKMBINDEX, TROPONINI in the last 168 hours. BNP (last 3 results) Recent Labs    05/09/20 1648  PROBNP 29.0   HbA1C: No results for input(s): HGBA1C in the last 72 hours.  CBG: Recent Labs  Lab 04/06/21 0832 04/06/21 1202 04/06/21 1630 04/06/21 2008 04/07/21 0803  GLUCAP 131* 109* 129* 151* 156*   Lipid Profile: No results for input(s): CHOL, HDL, LDLCALC, TRIG, CHOLHDL, LDLDIRECT in the last 72 hours. Thyroid Function Tests: No results for input(s): TSH, T4TOTAL, FREET4, T3FREE, THYROIDAB in the last 72 hours. Anemia Panel: No results for input(s): VITAMINB12, FOLATE, FERRITIN, TIBC, IRON, RETICCTPCT in the last 72 hours. Sepsis Labs: Recent Labs  Lab 04/02/21 1342 04/02/21 1616 04/03/21 0418 04/04/21 0339  PROCALCITON  --   --  0.14 2.58  LATICACIDVEN 4.1* 3.6*  --   --     Recent Results (from the past 240 hour(s))  Blood Culture (routine x 2)     Status: None   Collection Time: 04/02/21  1:42 PM   Specimen: BLOOD  Result Value Ref Range Status   Specimen Description BLOOD SITE NOT SPECIFIED  Final   Special Requests   Final    BOTTLES DRAWN AEROBIC AND ANAEROBIC Blood Culture adequate volume   Culture   Final    NO GROWTH 5 DAYS Performed at [facility], [address], [place], [place] [address]    Report Status 04/07/2021 FINAL  Final  Blood Culture (routine x 2)     Status: None   Collection Time: 04/02/21  2:45 PM   Specimen: BLOOD  Result Value Ref Range Status   Specimen Description BLOOD RIGHT ANTECUBITAL  Final   Special Requests   Final    BOTTLES DRAWN AEROBIC AND ANAEROBIC Blood Culture results may not be optimal due to an inadequate volume of blood received in culture bottles   Culture   Final    NO GROWTH 5  DAYS Performed at [facility], [place] [address], [place],  [address]    Report Status 04/07/2021 FINAL  Final  Resp Panel by RT-PCR (Flu A&B, Covid) Nasopharyngeal Swab     Status: Abnormal   Collection Time: 04/02/21  4:16 PM   Specimen: Nasopharyngeal Swab; Nasopharyngeal(NP) swabs in vial transport medium  Result Value Ref Range Status   SARS Coronavirus 2 by RT PCR NEGATIVE NEGATIVE Final    Comment: (NOTE) SARS-CoV-2 target nucleic acids are NOT DETECTED.  The SARS-CoV-2 RNA is generally detectable in upper respiratory specimens during the acute phase of infection. The lowest concentration of SARS-CoV-2 viral copies this assay can detect is 138 copies/mL. A negative result does not preclude SARS-Cov-2 infection and should not be used as the sole basis for treatment or other patient management decisions. A negative result may occur with  improper specimen collection/handling, submission of specimen other than nasopharyngeal swab, presence of viral mutation(s) within the areas targeted by this assay, and inadequate number of viral copies(<138 copies/mL). A negative result must be combined with clinical observations, patient history, and epidemiological information. The expected result is Negative.  Fact Sheet for Patients:  EntrepreneurPulse.com.au  Fact Sheet for Healthcare Providers:  IncredibleEmployment.be  This test is no t yet approved or cleared by the [place] FDA and  has been authorized for detection and/or diagnosis of SARS-CoV-2 by FDA under an Emergency Use Authorization (EUA). This EUA will remain  in effect (meaning this test can be used) for the duration of the COVID-19 declaration under Section 564(b)(1) of the Act, 21 U.S.C.section 360bbb-3(b)(1), unless the authorization is terminated  or revoked sooner.       Influenza A by PCR POSITIVE (A) NEGATIVE Final   Influenza B by  PCR NEGATIVE NEGATIVE Final     Comment: (NOTE) The Xpert Xpress SARS-CoV-2/FLU/RSV plus assay is intended as an aid in the diagnosis of influenza from Nasopharyngeal swab specimens and should not be used as a sole basis for treatment. Nasal washings and aspirates are unacceptable for Xpert Xpress SARS-CoV-2/FLU/RSV testing.  Fact Sheet for Patients: EntrepreneurPulse.com.au  Fact Sheet for Healthcare Providers: IncredibleEmployment.be  This test is not yet approved or cleared by the [place] FDA and has been authorized for detection and/or diagnosis of SARS-CoV-2 by FDA under an Emergency Use Authorization (EUA). This EUA will remain in effect (meaning this test can be used) for the duration of the COVID-19 declaration under Section 564(b)(1) of the Act, 21 U.S.C. section 360bbb-3(b)(1), unless the authorization is terminated or revoked.  Performed at [facility], [place] [address], [place], [place] [address]   Urine Culture     Status: Abnormal   Collection Time: 04/03/21  2:07 AM   Specimen: In/Out Cath Urine  Result Value Ref Range Status   Specimen Description IN/OUT CATH URINE  Final   Special Requests   Final    NONE Performed at [facility], [place] [address] [place], [place] [address]    Culture MULTIPLE SPECIES PRESENT, SUGGEST RECOLLECTION (A)  Final   Report Status 04/04/2021 FINAL  Final      Radiology Studies: No results found.  Scheduled Meds:  aspirin EC  81 mg Oral Once per day on Mon Thu   atorvastatin  80 mg Oral Daily   bisoprolol  2.5 mg Oral QHS   Budeson-Glycopyrrol-Formoterol  2 puff Inhalation BID   enoxaparin (LOVENOX) injection  40 mg Subcutaneous Q24H   guaiFENesin  600 mg Oral BID   insulin aspart  0-15 Units Subcutaneous TID WC   ipratropium-albuterol  3 mL Nebulization BID   levofloxacin  750 mg Oral Daily   methylPREDNISolone (SOLU-MEDROL) injection  40 mg Intravenous Q12H   oseltamivir  75 mg Oral BID    ticagrelor  90 mg Oral BID   Continuous Infusions:  sodium chloride 75 mL/hr at 04/07/21 0529     LOS: 4 days   Time spent: 35 minutes   Darliss Cheney, MD [facility]  04/07/2021, 11:22 AM  Please page via Shea Evans and do not message via secure chat for anything urgent. Secure chat can be used for anything non urgent.  How to contact the [facility] Attending or Consulting provider [place] or covering provider during after hours [place], for this patient?  Check the care team in [facility] and look for a) attending/consulting [facility] provider listed and b) the [facility] team listed. Page or secure chat [phone]. Log into [email or web address] and use 's universal password to access. If you do not have the password, please contact the hospital operator. Locate the [facility] provider you are looking for under [facility] and page to a number that you can be directly reached. If you still have difficulty reaching the provider, please page the [facility] (Director on Call) for the Hospitalists listed on amion for assistance.

## 2021-04-07 NOTE — Plan of Care (Signed)

## 2021-04-07 NOTE — Progress Notes (Signed)
Physical Therapy Treatment Patient Details Name: Timothy [name] MRN: [id number] DOB: [birth year] Today's Date: 04/07/2021   History of Present Illness 65  yo male wiht onset of flu and wheezing, SOB and hypoxia was admitted on 11/30 for management of exacerbation of COPD including mucus blocking of bronchii.  Cleared for PE but has acute resp failure, influenza A.  PMHx:  COPD gold 3, Home O2, HTN, HLD, CAD with stenting, DM,    PT Comments    Patient progressing slowly towards PT goals. Improved ambulation distance with Min guard assist for safety; 1 LOB when standing up but able to catch self. Continues to be limited by fatigue, SOB and weakness. Sp02 remained >92% on 4L/min 02 [place]. Encouraged increasing activity and sitting in chair as much as tolerated while in the hospital. Will follow.    Recommendations for follow up therapy are one component of a multi-disciplinary discharge planning process, led by the attending physician.  Recommendations may be updated based on patient status, additional functional criteria and insurance authorization.  Follow Up Recommendations  No PT follow up (pending progress)     Assistance Recommended at Discharge Intermittent Supervision/Assistance  Equipment Recommendations  None recommended by PT    Recommendations for Other Services       Precautions / Restrictions Precautions Precautions: Other (comment);Fall Precaution Comments: monitor O2 sats Restrictions Weight Bearing Restrictions: No     Mobility  Bed Mobility Overal bed mobility: Modified Independent                  Transfers Overall transfer level: Needs assistance Equipment used: None Transfers: Sit to/from Stand Sit to Stand: Min guard           General transfer comment: Min guard for safety, stood from EOB x2, from toilet x1, 1 LOB upon standing from EOBa fter returning from walk but able to catch self on bed.    Ambulation/Gait Ambulation/Gait assistance: Min  guard Gait Distance (Feet): 100 Feet Assistive device: None Gait Pattern/deviations: Step-through pattern;Decreased stride length Gait velocity: reduced     General Gait Details: Slow, mildly unsteady gait but no overt LOB. 3/4 DOE Sp02 remained in high 90s on 4L/min 02 . Dropped to 92% after getitng back from bathroom and post walk.   Stairs             Wheelchair Mobility    Modified Rankin (Stroke Patients Only)       Balance Overall balance assessment: Needs assistance Sitting-balance support: Feet supported;No upper extremity supported Sitting balance-Leahy Scale: Good     Standing balance support: During functional activity Standing balance-Leahy Scale: Fair                              Cognition Arousal/Alertness: Awake/alert Behavior During Therapy: WFL for tasks assessed/performed Overall Cognitive Status: Within Functional Limits for tasks assessed                                          Exercises      General Comments        Pertinent Vitals/Pain Pain Assessment: No/denies pain    Home Living                          Prior Function  PT Goals (current goals can now be found in the care plan section) Progress towards PT goals: Progressing toward goals    Frequency    Min 3X/week      PT Plan Discharge plan needs to be updated    Co-evaluation              AM-PAC PT "6 Clicks" Mobility   Outcome Measure  Help needed turning from your back to your side while in a flat bed without using bedrails?: None Help needed moving from lying on your back to sitting on the side of a flat bed without using bedrails?: None Help needed moving to and from a bed to a chair (including a wheelchair)?: A Little Help needed standing up from a chair using your arms (e.g., wheelchair or bedside chair)?: A Little Help needed to walk in hospital room?: A Little Help needed climbing 3-5 steps with  a railing? : A Little 6 Click Score: 20    End of Session Equipment Utilized During Treatment: Oxygen;Gait belt Activity Tolerance: Patient tolerated treatment well;Patient limited by fatigue Patient left: in bed;with call bell/phone within reach (sitting EOB) Nurse Communication: Mobility status PT Visit Diagnosis: Muscle weakness (generalized) (M62.81);Other abnormalities of gait and mobility (R26.89);Unsteadiness on feet (R26.81)     Time: 5597-4163 PT Time Calculation (min) (ACUTE ONLY): 23 min  Charges:  $Gait Training: 8-22 mins $Therapeutic Activity: 8-22 mins                     Marisa Severin, PT, DPT Acute Rehabilitation Services Pager [phone] Office [place] 04/07/2021, 9:29 AM

## 2021-04-07 NOTE — Progress Notes (Signed)
Pt with no Tele order per CCMD. MD made aware. New order, by Dr. Doristine Bosworth,  given to DC tele. Tele discontinued.

## 2021-04-07 NOTE — Progress Notes (Signed)
Mobility Specialist Progress Note   04/07/21 1755  Mobility  Activity Ambulated in hall  Level of Assistance Standby assist, set-up cues, supervision of patient - no hands on  Assistive Device None  Distance Ambulated (ft) 200 ft (120+60+20)  Mobility Ambulated independently in hallway  Mobility Response Tolerated well  Mobility performed by Mobility specialist  $Mobility charge 1 Mobility   Pt transitioned from supine <> EOB <> OOB Mod I while maintaining an SpO2 of >91% on 1LO2 [place]. While ambulating pt desat to a consistent SpO2 of 85%, pt was then raised to 2LO2 [place] where they resat to 92% SpO2. X2 standing breaks d/t DOE and tightness in chest. Pursed lip breathing resolved DOE after ~70mins. Returned back to the EOB w/ no further symptoms and all needs met.     Holland Falling Mobility Specialist Phone Number [phone]

## 2021-04-08 DIAGNOSIS — J439 Emphysema, unspecified: Secondary | ICD-10-CM | POA: Diagnosis not present

## 2021-04-08 LAB — GLUCOSE, CAPILLARY
Glucose-Capillary: 98 mg/dL (ref 70–99)
Glucose-Capillary: 119 mg/dL — ABNORMAL HIGH (ref 70–99)
Glucose-Capillary: 135 mg/dL — ABNORMAL HIGH (ref 70–99)
Glucose-Capillary: 171 mg/dL — ABNORMAL HIGH (ref 70–99)

## 2021-04-08 NOTE — Progress Notes (Signed)
Mobility Specialist Progress Note   04/08/21 1045  Oxygen Therapy  SpO2 92 %  O2 Device Nasal Cannula  O2 Flow Rate (L/min) 2 L/min  Mobility  Activity Ambulated in hall  Level of Assistance Independent after set-up  Assistive Device None  Distance Ambulated (ft) 235 ft  Mobility Ambulated independently in hallway  Mobility Response Tolerated well  Mobility performed by Mobility specialist  $Mobility charge 1 Mobility   Received pt in bed w/o complaint and agreeable to mobility. X1 seated break d/t fatigue and signs of DOE, quickly subsided w/ pursed lip breathing. Returned back to room w/o complaint and all needs met.   Holland Falling Mobility Specialist Phone Number [phone]

## 2021-04-08 NOTE — Progress Notes (Signed)
PROGRESS NOTE    Timothy [name]  RDE:[id number] DOB: [birth year] DOA: 04/02/2021 PCP: Ladell Pier, MD   Brief Narrative:  65 year old male with medical history of COPD, Gold stage III on as needed oxygen at home, hypertension, hyperlipidemia, CAD with stenting in 2021, diabetes mellitus type 2 presented with worsening cough, wheezing and shortness of breath.  Patient states that symptoms started 3 to 4 days ago, gradually getting worse, minimum activity triggered cough and shortness of breath.   CT angiogram was negative for pulmonary embolism  Assessment & Plan:   Principal Problem:   COPD (chronic obstructive pulmonary disease) (HCC) Active Problems:   COPD GOLD III   COPD exacerbation (HCC)  Acute on chronic hypoxic respiratory failure secondary to COPD exacerbation and influenza A infection: Patient uses 2 to 3 L oxygen at home only as needed basis and currently he is on 2 L but still complains of shortness of breath and is requiring slightly more oxygen with exertion.  Slightly wheezy but much improved compared to yesterday.  We will continue Solu-Medrol and bronchodilators and Tamiflu and keep another day.  Potential discharge tomorrow.  Essential hypertension: Controlled.  Continue bisoprolol.  History of CAD: Continue aspirin, bisoprolol, Lipitor and Brilinta, he is asymptomatic.  Type 2 diabetes mellitus: Blood sugar controlled on SSI.  DVT prophylaxis: enoxaparin (LOVENOX) injection 40 mg Start: 04/02/21 1742   Code Status: Full Code  Family Communication:  None present at bedside.  Plan of care discussed with patient in length and he verbalized understanding and agreed with it.  Status is: Inpatient  Remains inpatient appropriate because: Needs inpatient management for acute COPD exacerbation.   Estimated body mass index is 20.07 kg/m as calculated from the following:   Height as of this encounter: 5\' 6"  (1.676 m).   Weight as of this encounter: 56.4  kg.  Nutritional Assessment: Body mass index is 20.07 kg/m.Marland Kitchen Seen by dietician.  I agree with the assessment and plan as outlined below: Nutrition Status:   Skin Assessment: I have examined the patient's skin and I agree with the wound assessment as performed by the wound care RN as outlined below:   Consultants:  None  Procedures:  None  Antimicrobials:  Anti-infectives (From admission, onward)    Start     Dose/Rate Route Frequency Ordered Stop   04/04/21 0000  azithromycin (ZITHROMAX) tablet 500 mg  Status:  Discontinued       See Hyperspace for full Linked Orders Report.   500 mg Oral Daily 04/02/21 1637 04/02/21 1637   04/03/21 1800  levofloxacin (LEVAQUIN) tablet 750 mg  Status:  Discontinued        750 mg Oral Daily 04/02/21 1640 04/07/21 1345   04/03/21 1345  oseltamivir (TAMIFLU) capsule 75 mg        75 mg Oral 2 times daily 04/03/21 1339 04/07/21 2230   04/03/21 1000  azithromycin (ZITHROMAX) tablet 250 mg  Status:  Discontinued        250 mg Oral Daily 04/02/21 1638 04/02/21 1640   04/03/21 0000  azithromycin (ZITHROMAX) 500 mg in sodium chloride 0.9 % 250 mL IVPB  Status:  Discontinued       See Hyperspace for full Linked Orders Report.   500 mg 250 mL/hr over 60 Minutes Intravenous Every 24 hours 04/02/21 1637 04/02/21 1638   04/02/21 1530  cefTRIAXone (ROCEPHIN) 1 g in sodium chloride 0.9 % 100 mL IVPB        1 g 200 mL/hr  over 30 Minutes Intravenous  Once 04/02/21 1515 04/02/21 1610   04/02/21 1530  azithromycin (ZITHROMAX) 500 mg in sodium chloride 0.9 % 250 mL IVPB        500 mg 250 mL/hr over 60 Minutes Intravenous  Once 04/02/21 1515 04/02/21 1714          Subjective:  Seen and examined.  Feels much better but still not back to baseline.  Objective: Vitals:   04/08/21 0849 04/08/21 0852 04/08/21 0925 04/08/21 1045  BP:      Pulse:      Resp:      Temp:      TempSrc:      SpO2: 90% 94% (!) 85% 92%  Weight:      Height:         Intake/Output Summary (Last 24 hours) at 04/08/2021 1254 Last data filed at 04/08/2021 1131 Gross per 24 hour  Intake 1806.65 ml  Output 1925 ml  Net -118.35 ml    Filed Weights   04/05/21 1700  Weight: 56.4 kg    Examination:  General exam: Appears calm and comfortable  Respiratory system: Slightly diminished breath sounds with scattered expiratory wheezes, much improved compared to yesterday. Respiratory effort normal. Cardiovascular system: S1 & S2 heard, RRR. No JVD, murmurs, rubs, gallops or clicks. No pedal edema. Gastrointestinal system: Abdomen is nondistended, soft and nontender. No organomegaly or masses felt. Normal bowel sounds heard. Central nervous system: Alert and oriented. No focal neurological deficits. Extremities: Symmetric 5 x 5 power. Skin: No rashes, lesions or ulcers.  Psychiatry: Judgement and insight appear normal. Mood & affect appropriate.    Data Reviewed: I have personally reviewed following labs and imaging studies  CBC: Recent Labs  Lab 04/02/21 0125 04/02/21 0202 04/03/21 0225 04/03/21 0418 04/04/21 0339 04/06/21 0950 04/07/21 1041  WBC 20.1*  --   --  16.1* 10.9* 18.2* 17.7*  NEUTROABS 16.5*  --   --   --   --   --  13.8*  HGB 17.1*   < > 16.3 15.5 14.9 17.0 16.6  HCT 51.4   < > 48.0 47.3 44.8 50.2 49.3  MCV 93.8  --   --  93.3 92.2 91.1 91.5  PLT 328  --   --  301 298 412* 383   < > = values in this interval not displayed.    Basic Metabolic Panel: Recent Labs  Lab 04/02/21 0125 04/02/21 0202 04/03/21 0225 04/04/21 0339 04/06/21 0950 04/07/21 1041  NA 136 139 137 134* 133* 133*  K 3.9 4.0 3.9 4.3 5.0 4.5  CL 102  --   --  101 97* 99  CO2 25  --   --  25 26 27   GLUCOSE 104*  --   --  137* 146* 120*  BUN 13  --   --  24* 25* 21  CREATININE 0.96  --   --  0.81 0.86 0.84  CALCIUM 9.3  --   --  8.6* 9.3 8.9  MG  --   --   --   --   --  2.3    GFR: Estimated Creatinine Clearance: 70.9 mL/min (by C-G formula based on  SCr of 0.84 mg/dL). Liver Function Tests: Recent Labs  Lab 04/06/21 0950  AST 24  ALT 32  ALKPHOS 63  BILITOT 0.8  PROT 6.9  ALBUMIN 3.4*    No results for input(s): LIPASE, AMYLASE in the last 168 hours. No results for input(s): AMMONIA in the  last 168 hours. Coagulation Profile: Recent Labs  Lab 04/02/21 1342  INR 1.0    Cardiac Enzymes: No results for input(s): CKTOTAL, CKMB, CKMBINDEX, TROPONINI in the last 168 hours. BNP (last 3 results) Recent Labs    05/09/20 1648  PROBNP 29.0    HbA1C: No results for input(s): HGBA1C in the last 72 hours. CBG: Recent Labs  Lab 04/07/21 1130 04/07/21 1626 04/07/21 2014 04/08/21 0819 04/08/21 1124  GLUCAP 107* 107* 155* 98 119*    Lipid Profile: No results for input(s): CHOL, HDL, LDLCALC, TRIG, CHOLHDL, LDLDIRECT in the last 72 hours. Thyroid Function Tests: No results for input(s): TSH, T4TOTAL, FREET4, T3FREE, THYROIDAB in the last 72 hours. Anemia Panel: No results for input(s): VITAMINB12, FOLATE, FERRITIN, TIBC, IRON, RETICCTPCT in the last 72 hours. Sepsis Labs: Recent Labs  Lab 04/02/21 1342 04/02/21 1616 04/03/21 0418 04/04/21 0339 04/07/21 1126  PROCALCITON  --   --  0.14 2.58 <0.10  LATICACIDVEN 4.1* 3.6*  --   --   --      Recent Results (from the past 240 hour(s))  Blood Culture (routine x 2)     Status: None   Collection Time: 04/02/21  1:42 PM   Specimen: BLOOD  Result Value Ref Range Status   Specimen Description BLOOD SITE NOT SPECIFIED  Final   Special Requests   Final    BOTTLES DRAWN AEROBIC AND ANAEROBIC Blood Culture adequate volume   Culture   Final    NO GROWTH 5 DAYS Performed at [facility], [address], [place], [place] [address]    Report Status 04/07/2021 FINAL  Final  Blood Culture (routine x 2)     Status: None   Collection Time: 04/02/21  2:45 PM   Specimen: BLOOD  Result Value Ref Range Status   Specimen Description BLOOD RIGHT ANTECUBITAL  Final    Special Requests   Final    BOTTLES DRAWN AEROBIC AND ANAEROBIC Blood Culture results may not be optimal due to an inadequate volume of blood received in culture bottles   Culture   Final    NO GROWTH 5 DAYS Performed at [facility], [place] [address] [place], [place] [address]    Report Status 04/07/2021 FINAL  Final  Resp Panel by RT-PCR (Flu A&B, Covid) Nasopharyngeal Swab     Status: Abnormal   Collection Time: 04/02/21  4:16 PM   Specimen: Nasopharyngeal Swab; Nasopharyngeal(NP) swabs in vial transport medium  Result Value Ref Range Status   SARS Coronavirus 2 by RT PCR NEGATIVE NEGATIVE Final    Comment: (NOTE) SARS-CoV-2 target nucleic acids are NOT DETECTED.  The SARS-CoV-2 RNA is generally detectable in upper respiratory specimens during the acute phase of infection. The lowest concentration of SARS-CoV-2 viral copies this assay can detect is 138 copies/mL. A negative result does not preclude SARS-Cov-2 infection and should not be used as the sole basis for treatment or other patient management decisions. A negative result may occur with  improper specimen collection/handling, submission of specimen other than nasopharyngeal swab, presence of viral mutation(s) within the areas targeted by this assay, and inadequate number of viral copies(<138 copies/mL). A negative result must be combined with clinical observations, patient history, and epidemiological information. The expected result is Negative.  Fact Sheet for Patients:  EntrepreneurPulse.com.au  Fact Sheet for Healthcare Providers:  IncredibleEmployment.be  This test is no t yet approved or cleared by the [place] FDA and  has been authorized for detection and/or  diagnosis of SARS-CoV-2 by FDA under an Emergency Use Authorization (EUA). This EUA will remain  in effect (meaning this test can be used) for the duration of the COVID-19 declaration under Section 564(b)(1) of  the Act, 21 U.S.C.section 360bbb-3(b)(1), unless the authorization is terminated  or revoked sooner.       Influenza A by PCR POSITIVE (A) NEGATIVE Final   Influenza B by PCR NEGATIVE NEGATIVE Final    Comment: (NOTE) The Xpert Xpress SARS-CoV-2/FLU/RSV plus assay is intended as an aid in the diagnosis of influenza from Nasopharyngeal swab specimens and should not be used as a sole basis for treatment. Nasal washings and aspirates are unacceptable for Xpert Xpress SARS-CoV-2/FLU/RSV testing.  Fact Sheet for Patients: EntrepreneurPulse.com.au  Fact Sheet for Healthcare Providers: IncredibleEmployment.be  This test is not yet approved or cleared by the [place] FDA and has been authorized for detection and/or diagnosis of SARS-CoV-2 by FDA under an Emergency Use Authorization (EUA). This EUA will remain in effect (meaning this test can be used) for the duration of the COVID-19 declaration under Section 564(b)(1) of the Act, 21 U.S.C. section 360bbb-3(b)(1), unless the authorization is terminated or revoked.  Performed at [facility], [place] [address], [place], [place] [address]   Urine Culture     Status: Abnormal   Collection Time: 04/03/21  2:07 AM   Specimen: In/Out Cath Urine  Result Value Ref Range Status   Specimen Description IN/OUT CATH URINE  Final   Special Requests   Final    NONE Performed at [facility], [place] [address], [place], [place] [address]    Culture MULTIPLE SPECIES PRESENT, SUGGEST RECOLLECTION (A)  Final   Report Status 04/04/2021 FINAL  Final       Radiology Studies: No results found.  Scheduled Meds:  aspirin EC  81 mg Oral Once per day on Mon Thu   atorvastatin  80 mg Oral Daily   bisoprolol  2.5 mg Oral QHS   Budeson-Glycopyrrol-Formoterol  2 puff Inhalation BID   enoxaparin (LOVENOX) injection  40 mg Subcutaneous Q24H   guaiFENesin  600 mg Oral BID   insulin aspart  0-15 Units  Subcutaneous TID WC   ipratropium-albuterol  3 mL Nebulization BID   methylPREDNISolone (SOLU-MEDROL) injection  40 mg Intravenous Q12H   ticagrelor  90 mg Oral BID   Continuous Infusions:     LOS: 5 days   Time spent: 26 minutes   Darliss Cheney, MD [facility]  04/08/2021, 12:54 PM  Please page via [facility] and do not message via secure chat for anything urgent. Secure chat can be used for anything non urgent.  How to contact the [facility] Attending or Consulting provider [place] or covering provider during after hours [place], for this patient?  Check the care team in [facility] and look for a) attending/consulting TR[facility] provider listed and b) the [facility] team listed. Page or secure chat [phone]. Log into [email or web address] and use [place]'s universal password to access. If you do not have the password, please contact the hospital operator. Locate the [facility] provider you are looking for under [facility] and page to a number that you can be directly reached. If you still have difficulty reaching the provider, please page the [facility] (Director on Call) for the Hospitalists listed on amion for assistance.

## 2021-04-08 NOTE — Plan of Care (Signed)
  Problem: Clinical Measurements: Goal: Respiratory complications will improve Outcome: Progressing   Problem: Coping: Goal: Level of anxiety will decrease Outcome: Progressing   Problem: Elimination: Goal: Will not experience complications related to urinary retention Outcome: Progressing   Problem: Pain Managment: Goal: General experience of comfort will improve Outcome: Progressing   Problem: Safety: Goal: Ability to remain free from injury will improve Outcome: Progressing

## 2021-04-09 DIAGNOSIS — R7303 Prediabetes: Secondary | ICD-10-CM

## 2021-04-09 DIAGNOSIS — I10 Essential (primary) hypertension: Secondary | ICD-10-CM

## 2021-04-09 DIAGNOSIS — J101 Influenza due to other identified influenza virus with other respiratory manifestations: Secondary | ICD-10-CM | POA: Diagnosis not present

## 2021-04-09 DIAGNOSIS — J9621 Acute and chronic respiratory failure with hypoxia: Secondary | ICD-10-CM | POA: Diagnosis present

## 2021-04-09 DIAGNOSIS — J441 Chronic obstructive pulmonary disease with (acute) exacerbation: Secondary | ICD-10-CM | POA: Diagnosis not present

## 2021-04-09 LAB — GLUCOSE, CAPILLARY
Glucose-Capillary: 143 mg/dL — ABNORMAL HIGH (ref 70–99)
Glucose-Capillary: 123 mg/dL — ABNORMAL HIGH (ref 70–99)

## 2021-04-09 LAB — BASIC METABOLIC PANEL
Anion gap: 10 (ref 5–15)
BUN: 21 mg/dL (ref 8–23)
CO2: 24 mmol/L (ref 22–32)
Calcium: 8.8 mg/dL — ABNORMAL LOW (ref 8.9–10.3)
Chloride: 97 mmol/L — ABNORMAL LOW (ref 98–111)
Creatinine, Ser: 0.78 mg/dL (ref 0.61–1.24)
GFR, Estimated: >60 mL/min (ref >60)
Glucose, Bld: 116 mg/dL — ABNORMAL HIGH (ref 70–99)
Potassium: 4.9 mmol/L (ref 3.5–5.1)
Sodium: 131 mmol/L — ABNORMAL LOW (ref 135–145)

## 2021-04-09 NOTE — Progress Notes (Signed)
Physical Therapy Treatment Patient Details Name: Timothy [name] MRN: [id number] DOB: [birth year] Today's Date: 04/09/2021   History of Present Illness 65  yo male wiht onset of flu and wheezing, SOB and hypoxia was admitted on 11/30 for management of exacerbation of COPD including mucus blocking of bronchii.  Cleared for PE but has acute resp failure, influenza A.  PMHx:  COPD gold 3, Home O2, HTN, HLD, CAD with stenting, DM,    PT Comments    Pt was seen for longer walk with and without O2 and note he requires O2 for longer trips to walk but in room maintains in low 90's.  Pt is aware of feeling SOB and will have some previous experience with managing his O2 at home.  Follow along with him but anticipate dc today, with O2 need demonstrated.  Pre walk sat off O2 was 92%, post walk 87% on room air;  postwalk with O2 4L dropped to 86% a second and recovered to 93%.  Very good tolerances for longer walks.  No followup PT is indicated.   Recommendations for follow up therapy are one component of a multi-disciplinary discharge planning process, led by the attending physician.  Recommendations may be updated based on patient status, additional functional criteria and insurance authorization.  Follow Up Recommendations  No PT follow up     Assistance Recommended at Discharge Intermittent Supervision/Assistance  Equipment Recommendations  None recommended by PT    Recommendations for Other Services       Precautions / Restrictions Precautions Precautions: Other (comment);Fall Precaution Comments: ck sats Restrictions Weight Bearing Restrictions: No     Mobility  Bed Mobility Overal bed mobility: Modified Independent                  Transfers Overall transfer level: Modified independent Equipment used: None                    Ambulation/Gait Ambulation/Gait assistance: Supervision;Min guard (for safety) Gait Distance (Feet): 220 Feet (75+145) Assistive device:  Rolling walker (2 wheels) Gait Pattern/deviations: Step-through pattern;Decreased stride length Gait velocity: reduced   Pre-gait activities: standing balance testing General Gait Details: slow walk both trials initially with no O2 and desatted to 87%;  second walk on 4L with sat down to 86% but very fast recovery   Stairs             Wheelchair Mobility    Modified Rankin (Stroke Patients Only)       Balance Overall balance assessment: Needs assistance Sitting-balance support: Feet supported Sitting balance-Leahy Scale: Good     Standing balance support: Bilateral upper extremity supported;During functional activity Standing balance-Leahy Scale: Fair                              Cognition Arousal/Alertness: Awake/alert Behavior During Therapy: WFL for tasks assessed/performed Overall Cognitive Status: Within Functional Limits for tasks assessed                                          Exercises      General Comments General comments (skin integrity, edema, etc.): pt is up to walk with help, noting his need to use O2 but very fast recovery on either room air or 4L      Pertinent Vitals/Pain Pain Assessment: No/denies pain  Home Living                          Prior Function            PT Goals (current goals can now be found in the care plan section) Acute Rehab PT Goals Patient Stated Goal: to be able to walk to BR alone Progress towards PT goals: Progressing toward goals    Frequency    Min 3X/week      PT Plan Current plan remains appropriate    Co-evaluation              AM-PAC PT "6 Clicks" Mobility   Outcome Measure  Help needed turning from your back to your side while in a flat bed without using bedrails?: None Help needed moving from lying on your back to sitting on the side of a flat bed without using bedrails?: None Help needed moving to and from a bed to a chair (including a  wheelchair)?: A Little Help needed standing up from a chair using your arms (e.g., wheelchair or bedside chair)?: A Little Help needed to walk in hospital room?: A Little Help needed climbing 3-5 steps with a railing? : A Little 6 Click Score: 20    End of Session Equipment Utilized During Treatment: Oxygen;Gait belt Activity Tolerance: Patient tolerated treatment well;Patient limited by fatigue Patient left: in chair;with call bell/phone within reach Nurse Communication: Mobility status PT Visit Diagnosis: Muscle weakness (generalized) (M62.81);Other abnormalities of gait and mobility (R26.89);Unsteadiness on feet (R26.81)     Time: 3578-9784 PT Time Calculation (min) (ACUTE ONLY): 17 min  Charges:  $Gait Training: 8-22 mins           Ramond Dial 04/09/2021, 1:07 PM  Mee Hives, PT PhD Acute Rehab Dept. Number: [place]

## 2021-04-09 NOTE — Discharge Summary (Signed)
Physician Discharge Summary  Timothy [name]:[id number] DOB: June 16, 1955 DOA: 04/02/2021  PCP: Ladell Pier, MD  Admit date: 04/02/2021 Discharge date: 04/09/2021 30 Day Unplanned Readmission Risk Score    Flowsheet Row ED to Hosp-Admission (Current) from 04/02/2021 in [facility]  30 Day Unplanned Readmission Risk Score (%) 14.96 Filed at 04/09/2021 0801       This score is the patient's risk of an unplanned readmission within 30 days of being discharged (0 -100%). The score is based on dignosis, age, lab data, medications, orders, and past utilization.   Low:  0-14.9   Medium: 15-21.9   High: 22-29.9   Extreme: 30 and above          Admitted From: Home Disposition: Home  Recommendations for Outpatient Follow-up:  Follow up with PCP in 1-2 weeks Please obtain BMP/CBC in one week Please follow up with your PCP on the following pending results: Unresulted Labs (From admission, onward)    None         Home Health: None Equipment/Devices: None  Discharge Condition: Stable CODE STATUS: Full code Diet recommendation: Cardiac  Subjective: Seen and examined.  No complaints.  No shortness of breath.  He is saturating more than 92% on room air since last night.  Brief/Interim Summary: 65 year old male with medical history of COPD, Gold stage III on as needed oxygen at home, hypertension, hyperlipidemia, CAD with stenting in 2021, diabetes mellitus type 2 presented with worsening cough, wheezing and shortness of breath. CT angiogram was negative for pulmonary embolism.  Tested negative for COVID but tested positive for influenza A.  He was admitted with acute on chronic hypoxic respiratory failure secondary to acute COPD exacerbation, started on IV Solu-Medrol, bronchodilators as well as antibiotics, no evidence of pneumonia.  He was also started on Tamiflu.  Patient has improved significantly since admission.  He has received 6 to 7 days  of IV Solu-Medrol since admission and completed 5 days of Tamiflu as well.  Doing much better.  Back on room air.  No wheezes on exam.  He is being discharged home in stable condition.   Essential hypertension: Controlled.  Continue bisoprolol.   History of CAD: Continue aspirin, bisoprolol, Lipitor and Brilinta, he is asymptomatic.   Type 2 diabetes mellitus: Resume home medications.  Discharge Diagnoses:  Active Problems:   Former smoker   Essential hypertension   Prediabetes   COPD exacerbation ([place])   Influenza A   Acute on chronic respiratory failure with hypoxia ([place])    Discharge Instructions   Allergies as of 04/09/2021       Reactions   Ace Inhibitors Other (See Comments)   Caused wheezing   Aspirin Swelling, Other (See Comments)   Lips swell- still takes approx 2 times a week in 2021   Ibuprofen Swelling, Other (See Comments)   Lips swell   Losartan Other (See Comments)   Leg cramps        Medication List     TAKE these medications    acetaminophen 500 MG tablet Commonly known as: TYLENOL Take 500-1,000 mg by mouth every 8 (eight) hours as needed for mild pain or headache.   albuterol 108 (90 Base) MCG/ACT inhaler Commonly known as: VENTOLIN HFA Inhale 2 puffs into the lungs every 6 (six) hours as needed for wheezing or shortness of breath.   aspirin EC 81 MG tablet Take 1 tablet (81 mg total) by mouth daily. What changed: when to take  this   atorvastatin 80 MG tablet Commonly known as: LIPITOR TAKE 1 TABLET(80 MG) BY MOUTH EVERY EVENING What changed: See the new instructions.   bisoprolol 5 MG tablet Commonly known as: ZEBETA TAKE 1/2 TABLET(2.5 MG) BY MOUTH AT BEDTIME What changed: See the new instructions.   Breztri Aerosphere 160-9-4.8 MCG/ACT Aero Generic drug: Budeson-Glycopyrrol-Formoterol Inhale 2 puffs into the lungs 2 (two) times daily.   famotidine 20 MG tablet Commonly known as: PEPCID Take 20 mg by mouth daily as needed for  heartburn or indigestion.   metFORMIN 500 MG tablet Commonly known as: GLUCOPHAGE Take 0.5 tablets (250 mg total) by mouth daily with breakfast.   nitroGLYCERIN 0.4 MG SL tablet Commonly known as: Nitrostat Place 1 tablet (0.4 mg total) under the tongue every 5 (five) minutes as needed for chest pain. NEED OV.   ticagrelor 90 MG Tabs tablet Commonly known as: BRILINTA Take 1 tablet (90 mg total) by mouth 2 (two) times daily.        Follow-up Information     Ladell Pier, MD Follow up.   Specialty: Internal Medicine Contact information: [place] [address] [phone]         Troy Sine, MD .   Specialty: Cardiology Contact information: [address] [place] [address] [phone]         Ladell Pier, MD Follow up in 1 week(s).   Specialty: Internal Medicine Contact information: [place] [address] [phone]         Troy Sine, MD .   Specialty: Cardiology Contact information: [address] [place] [address] [phone]                Allergies  Allergen Reactions   Ace Inhibitors Other (See Comments)    Caused wheezing   Aspirin Swelling and Other (See Comments)    Lips swell- still takes approx 2 times a week in 2021   Ibuprofen Swelling and Other (See Comments)    Lips swell   Losartan Other (See Comments)    Leg cramps    Consultations: none   Procedures/Studies: DG Chest 2 View  Result Date: 04/02/2021 CLINICAL DATA:  Shortness of breath EXAM: CHEST - 2 VIEW COMPARISON:  02/24/2020 FINDINGS: The lungs are hyperinflated with diffuse interstitial prominence. No focal airspace consolidation or pulmonary edema. No pleural effusion or pneumothorax. Normal cardiomediastinal contours. IMPRESSION: COPD without acute airspace disease. Electronically Signed   By: Ulyses Jarred M.D.   On: 04/02/2021 02:31   CT Angio Chest PE W/Cm &/Or  Wo Cm  Result Date: 04/02/2021 CLINICAL DATA:  Pulmonary emboli suspected, high probability. Shortness of breath over the last several days. EXAM: CT ANGIOGRAPHY CHEST WITH CONTRAST TECHNIQUE: Multidetector CT imaging of the chest was performed using the standard protocol during bolus administration of intravenous contrast. Multiplanar CT image reconstructions and MIPs were obtained to evaluate the vascular anatomy. CONTRAST:  28mL OMNIPAQUE IOHEXOL 350 MG/ML SOLN COMPARISON:  Chest radiography same day.  CT 11/25/2016 FINDINGS: Cardiovascular: Heart size is normal. There is coronary artery calcification and/or stents. There is aortic atherosclerotic calcification. Pulmonary arterial opacification is excellent. There are no pulmonary emboli. Mediastinum/Nodes: No mediastinal or hilar mass or lymphadenopathy. Lungs/Pleura: Advanced pulmonary emphysema, upper lung predominant. There are a few mucous filled bronchi in both lower lobes but there is no parenchymal consolidation, collapse or effusion. There is no pulmonary mass or nodule in need of follow-up.  3 mm subpleural nodule in the right lower lobe is unchanged and benign. Upper Abdomen: Normal Musculoskeletal: Normal Review of the MIP images confirms the above findings. IMPRESSION: No pulmonary emboli. Coronary artery calcification and/or stents. Aortic Atherosclerosis (ICD10-I70.0) and Emphysema (ICD10-J43.9). The emphysema is quite advanced. No pulmonary infiltrate or collapse. There are a few mucous filled bronchi in the lower lobes Electronically Signed   By: Nelson Chimes M.D.   On: 04/02/2021 16:24     Discharge Exam: Vitals:   04/09/21 0740 04/09/21 0853  BP: 137/87   Pulse: 100   Resp: 17   Temp: 98.6 F (37 C)   SpO2: 94% 96%   Vitals:   04/08/21 2046 04/09/21 0439 04/09/21 0740 04/09/21 0853  BP:  119/74 137/87   Pulse:  78 100   Resp:  16 17   Temp:  97.8 F (36.6 C) 98.6 F (37 C)   TempSrc:  Oral Oral   SpO2: 96% 95% 94% 96%   Weight:      Height:        General: Pt is alert, awake, not in acute distress Cardiovascular: RRR, S1/S2 +, no rubs, no gallops Respiratory: CTA bilaterally, no wheezing, no rhonchi Abdominal: Soft, NT, ND, bowel sounds + Extremities: no edema, no cyanosis    The results of significant diagnostics from this hospitalization (including imaging, microbiology, ancillary and laboratory) are listed below for reference.     Microbiology: Recent Results (from the past 240 hour(s))  Blood Culture (routine x 2)     Status: None   Collection Time: 04/02/21  1:42 PM   Specimen: BLOOD  Result Value Ref Range Status   Specimen Description BLOOD SITE NOT SPECIFIED  Final   Special Requests   Final    BOTTLES DRAWN AEROBIC AND ANAEROBIC Blood Culture adequate volume   Culture   Final    NO GROWTH 5 DAYS Performed at [facility], [address] [place], [place] [address]    Report Status 04/07/2021 FINAL  Final  Blood Culture (routine x 2)     Status: None   Collection Time: 04/02/21  2:45 PM   Specimen: BLOOD  Result Value Ref Range Status   Specimen Description BLOOD RIGHT ANTECUBITAL  Final   Special Requests   Final    BOTTLES DRAWN AEROBIC AND ANAEROBIC Blood Culture results may not be optimal due to an inadequate volume of blood received in culture bottles   Culture   Final    NO GROWTH 5 DAYS Performed at [facility], [place] [address] [place], [place] [address]    Report Status 04/07/2021 FINAL  Final  Resp Panel by RT-PCR (Flu A&B, Covid) Nasopharyngeal Swab     Status: Abnormal   Collection Time: 04/02/21  4:16 PM   Specimen: Nasopharyngeal Swab; Nasopharyngeal(NP) swabs in vial transport medium  Result Value Ref Range Status   SARS Coronavirus 2 by RT PCR NEGATIVE NEGATIVE Final    Comment: (NOTE) SARS-CoV-2 target nucleic acids are NOT DETECTED.  The SARS-CoV-2 RNA is generally detectable in upper respiratory specimens during the acute phase of infection.  The lowest concentration of SARS-CoV-2 viral copies this assay can detect is 138 copies/mL. A negative result does not preclude SARS-Cov-2 infection and should not be used as the sole basis for treatment or other patient management decisions. A negative result may occur with  improper specimen collection/handling, submission of specimen other than nasopharyngeal swab, presence of viral mutation(s) within the areas targeted  by this assay, and inadequate number of viral copies(<138 copies/mL). A negative result must be combined with clinical observations, patient history, and epidemiological information. The expected result is Negative.  Fact Sheet for Patients:  EntrepreneurPulse.com.au  Fact Sheet for Healthcare Providers:  IncredibleEmployment.be  This test is no t yet approved or cleared by the [place] FDA and  has been authorized for detection and/or diagnosis of SARS-CoV-2 by FDA under an Emergency Use Authorization (EUA). This EUA will remain  in effect (meaning this test can be used) for the duration of the COVID-19 declaration under Section 564(b)(1) of the Act, 21 U.S.C.section 360bbb-3(b)(1), unless the authorization is terminated  or revoked sooner.       Influenza A by PCR POSITIVE (A) NEGATIVE Final   Influenza B by PCR NEGATIVE NEGATIVE Final    Comment: (NOTE) The Xpert Xpress SARS-CoV-2/FLU/RSV plus assay is intended as an aid in the diagnosis of influenza from Nasopharyngeal swab specimens and should not be used as a sole basis for treatment. Nasal washings and aspirates are unacceptable for Xpert Xpress SARS-CoV-2/FLU/RSV testing.  Fact Sheet for Patients: EntrepreneurPulse.com.au  Fact Sheet for Healthcare Providers: IncredibleEmployment.be  This test is not yet approved or cleared by the [place] FDA and has been authorized for detection and/or diagnosis of SARS-CoV-2  by FDA under an Emergency Use Authorization (EUA). This EUA will remain in effect (meaning this test can be used) for the duration of the COVID-19 declaration under Section 564(b)(1) of the Act, 21 U.S.C. section 360bbb-3(b)(1), unless the authorization is terminated or revoked.  Performed at [facility], [place] [address] [place], [place] [address]   Urine Culture     Status: Abnormal   Collection Time: 04/03/21  2:07 AM   Specimen: In/Out Cath Urine  Result Value Ref Range Status   Specimen Description IN/OUT CATH URINE  Final   Special Requests   Final    NONE Performed at [facility], [place] [address], [place], [place] [address]    Culture MULTIPLE SPECIES PRESENT, SUGGEST RECOLLECTION (A)  Final   Report Status 04/04/2021 FINAL  Final     Labs: BNP (last 3 results) Recent Labs    04/02/21 1417  BNP 00.9   Basic Metabolic Panel: Recent Labs  Lab 04/03/21 0225 04/04/21 0339 04/06/21 0950 04/07/21 1041 04/09/21 0033  NA 137 134* 133* 133* 131*  K 3.9 4.3 5.0 4.5 4.9  CL  --  101 97* 99 97*  CO2  --  25 26 27 24   GLUCOSE  --  137* 146* 120* 116*  BUN  --  24* 25* 21 21  CREATININE  --  0.81 0.86 0.84 0.78  CALCIUM  --  8.6* 9.3 8.9 8.8*  MG  --   --   --  2.3  --    Liver Function Tests: Recent Labs  Lab 04/06/21 0950  AST 24  ALT 32  ALKPHOS 63  BILITOT 0.8  PROT 6.9  ALBUMIN 3.4*   No results for input(s): LIPASE, AMYLASE in the last 168 hours. No results for input(s): AMMONIA in the last 168 hours. CBC: Recent Labs  Lab 04/03/21 0225 04/03/21 0418 04/04/21 0339 04/06/21 0950 04/07/21 1041  WBC  --  16.1* 10.9* 18.2* 17.7*  NEUTROABS  --   --   --   --  13.8*  HGB 16.3 15.5 14.9 17.0 16.6  HCT 48.0 47.3 44.8 50.2 49.3  MCV  --  93.3 92.2 91.1 91.5  PLT  --  301 298 412* 383   Cardiac Enzymes: No results for input(s): CKTOTAL, CKMB, CKMBINDEX, TROPONINI in the last 168 hours. BNP: Invalid input(s): POCBNP CBG: Recent Labs   Lab 04/08/21 0819 04/08/21 1124 04/08/21 1600 04/08/21 2033 04/09/21 0739  GLUCAP 98 119* 135* 171* 143*   D-Dimer No results for input(s): DDIMER in the last 72 hours. Hgb A1c No results for input(s): HGBA1C in the last 72 hours. Lipid Profile No results for input(s): CHOL, HDL, LDLCALC, TRIG, CHOLHDL, LDLDIRECT in the last 72 hours. Thyroid function studies No results for input(s): TSH, T4TOTAL, T3FREE, THYROIDAB in the last 72 hours.  Invalid input(s): FREET3 Anemia work up No results for input(s): VITAMINB12, FOLATE, FERRITIN, TIBC, IRON, RETICCTPCT in the last 72 hours. Urinalysis    Component Value Date/Time   COLORURINE YELLOW 04/03/2021 0207   APPEARANCEUR CLEAR 04/03/2021 0207   LABSPEC 1.020 04/03/2021 0207   PHURINE 5.5 04/03/2021 0207   GLUCOSEU NEGATIVE 04/03/2021 0207   HGBUR TRACE (A) 04/03/2021 0207   BILIRUBINUR NEGATIVE 04/03/2021 0207   KETONESUR NEGATIVE 04/03/2021 0207   PROTEINUR NEGATIVE 04/03/2021 0207   UROBILINOGEN 0.2 06/12/2013 0415   NITRITE POSITIVE (A) 04/03/2021 0207   LEUKOCYTESUR TRACE (A) 04/03/2021 0207   Sepsis Labs Invalid input(s): PROCALCITONIN,  WBC,  LACTICIDVEN Microbiology Recent Results (from the past 240 hour(s))  Blood Culture (routine x 2)     Status: None   Collection Time: 04/02/21  1:42 PM   Specimen: BLOOD  Result Value Ref Range Status   Specimen Description BLOOD SITE NOT SPECIFIED  Final   Special Requests   Final    BOTTLES DRAWN AEROBIC AND ANAEROBIC Blood Culture adequate volume   Culture   Final    NO GROWTH 5 DAYS Performed at [facility], [address], [place], [place] [address]    Report Status 04/07/2021 FINAL  Final  Blood Culture (routine x 2)     Status: None   Collection Time: 04/02/21  2:45 PM   Specimen: BLOOD  Result Value Ref Range Status   Specimen Description BLOOD RIGHT ANTECUBITAL  Final   Special Requests   Final    BOTTLES DRAWN AEROBIC AND ANAEROBIC Blood Culture results  may not be optimal due to an inadequate volume of blood received in culture bottles   Culture   Final    NO GROWTH 5 DAYS Performed at [facility], [place] [name], [place], [place] [address]    Report Status 04/07/2021 FINAL  Final  Resp Panel by RT-PCR (Flu A&B, Covid) Nasopharyngeal Swab     Status: Abnormal   Collection Time: 04/02/21  4:16 PM   Specimen: Nasopharyngeal Swab; Nasopharyngeal(NP) swabs in vial transport medium  Result Value Ref Range Status   SARS Coronavirus 2 by RT PCR NEGATIVE NEGATIVE Final    Comment: (NOTE) SARS-CoV-2 target nucleic acids are NOT DETECTED.  The SARS-CoV-2 RNA is generally detectable in upper respiratory specimens during the acute phase of infection. The lowest concentration of SARS-CoV-2 viral copies this assay can detect is 138 copies/mL. A negative result does not preclude SARS-Cov-2 infection and should not be used as the sole basis for treatment or other patient management decisions. A negative result may occur with  improper specimen collection/handling, submission of specimen other than nasopharyngeal swab, presence of viral mutation(s) within the areas targeted by this assay, and inadequate number of viral copies(<138 copies/mL). A negative result must be combined with clinical observations, patient history,  and epidemiological information. The expected result is Negative.  Fact Sheet for Patients:  EntrepreneurPulse.com.au  Fact Sheet for Healthcare Providers:  IncredibleEmployment.be  This test is no t yet approved or cleared by the [place] FDA and  has been authorized for detection and/or diagnosis of SARS-CoV-2 by FDA under an Emergency Use Authorization (EUA). This EUA will remain  in effect (meaning this test can be used) for the duration of the COVID-19 declaration under Section 564(b)(1) of the Act, 21 U.S.C.section 360bbb-3(b)(1), unless the authorization is terminated  or  revoked sooner.       Influenza A by PCR POSITIVE (A) NEGATIVE Final   Influenza B by PCR NEGATIVE NEGATIVE Final    Comment: (NOTE) The Xpert Xpress SARS-CoV-2/FLU/RSV plus assay is intended as an aid in the diagnosis of influenza from Nasopharyngeal swab specimens and should not be used as a sole basis for treatment. Nasal washings and aspirates are unacceptable for Xpert Xpress SARS-CoV-2/FLU/RSV testing.  Fact Sheet for Patients: EntrepreneurPulse.com.au  Fact Sheet for Healthcare Providers: IncredibleEmployment.be  This test is not yet approved or cleared by the [place] FDA and has been authorized for detection and/or diagnosis of SARS-CoV-2 by FDA under an Emergency Use Authorization (EUA). This EUA will remain in effect (meaning this test can be used) for the duration of the COVID-19 declaration under Section 564(b)(1) of the Act, 21 U.S.C. section 360bbb-3(b)(1), unless the authorization is terminated or revoked.  Performed at [facility], [place] [address], [place], [place] [address]   Urine Culture     Status: Abnormal   Collection Time: 04/03/21  2:07 AM   Specimen: In/Out Cath Urine  Result Value Ref Range Status   Specimen Description IN/OUT CATH URINE  Final   Special Requests   Final    NONE Performed at [facility], [place] [address], [place], [place] [address]    Culture MULTIPLE SPECIES PRESENT, SUGGEST RECOLLECTION (A)  Final   Report Status 04/04/2021 FINAL  Final     Time coordinating discharge: Over 30 minutes  SIGNED:   Darliss Cheney, MD  [facility] 04/09/2021, 10:06 AM  If 7PM-7AM, please contact night-coverage [email or web address]

## 2021-04-10 ENCOUNTER — Telehealth: Payer: Self-pay

## 2021-04-10 NOTE — Telephone Encounter (Signed)
Transition Care Management Follow-up Telephone Call Date of discharge and from where: 04/09/2021, [facility] How have you been since you were released from the hospital? He said he is feeling much better from when he was in the hospital  Any questions or concerns? No  Items Reviewed: Did the pt receive and understand the discharge instructions provided? Yes  Medications obtained and verified? Yes  - he said he has all medications and did not have any questions about his med regime.  No new meds were ordered.  Other? No  Any new allergies since your discharge? No  Dietary orders reviewed? Yes Do you have support at home? Yes   Home Care and Equipment/Supplies: Were home health services ordered? no If so, what is the name of the agency? N/a  Has the agency set up a time to come to the patient's home? not applicable Were any new equipment or medical supplies ordered?  No What is the name of the medical supply agency? N/a Were you able to get the supplies/equipment? not applicable Do you have any questions related to the use of the equipment or supplies? No  He has home O2 that he uses @ 2-3 L only when moving around and exerting himself.   Functional Questionnaire: (I = Independent and D = Dependent) ADLs: independent   Follow up appointments reviewed:  PCP Hospital f/u appt confirmed? Yes  Scheduled to see Dr  Wynetta Emery on 04/21/2021. [facility] f/u appt confirmed? Yes  Scheduled to see pulmonary on 06/20/2021. Are transportation arrangements needed? No  If their condition worsens, is the pt aware to call PCP or go to the Emergency Dept.? Yes Was the patient provided with contact information for the PCP's office or ED? Yes Was to pt encouraged to call back with questions or concerns? Yes

## 2021-04-21 ENCOUNTER — Ambulatory Visit: Payer: Medicare Other | Attending: Internal Medicine | Admitting: Internal Medicine

## 2021-04-21 ENCOUNTER — Other Ambulatory Visit: Payer: Self-pay

## 2021-04-21 VITALS — BP 117/75 | HR 76 | Ht 66.0 in | Wt 124.8 lb

## 2021-04-21 DIAGNOSIS — J9611 Chronic respiratory failure with hypoxia: Secondary | ICD-10-CM | POA: Diagnosis not present

## 2021-04-21 DIAGNOSIS — Z09 Encounter for follow-up examination after completed treatment for conditions other than malignant neoplasm: Secondary | ICD-10-CM

## 2021-04-21 DIAGNOSIS — R7303 Prediabetes: Secondary | ICD-10-CM

## 2021-04-21 DIAGNOSIS — Z1211 Encounter for screening for malignant neoplasm of colon: Secondary | ICD-10-CM

## 2021-04-21 DIAGNOSIS — Z23 Encounter for immunization: Secondary | ICD-10-CM | POA: Diagnosis not present

## 2021-04-21 DIAGNOSIS — I2510 Atherosclerotic heart disease of native coronary artery without angina pectoris: Secondary | ICD-10-CM

## 2021-04-21 DIAGNOSIS — D72829 Elevated white blood cell count, unspecified: Secondary | ICD-10-CM | POA: Diagnosis not present

## 2021-04-21 DIAGNOSIS — J449 Chronic obstructive pulmonary disease, unspecified: Secondary | ICD-10-CM

## 2021-04-21 DIAGNOSIS — Z9981 Dependence on supplemental oxygen: Secondary | ICD-10-CM

## 2021-04-21 MED ORDER — BISOPROLOL FUMARATE 5 MG PO TABS: 2.5000 mg | ORAL_TABLET | Freq: Every day | ORAL | 3 refills | Status: DC

## 2021-04-21 MED ORDER — ATORVASTATIN CALCIUM 80 MG PO TABS
80.0000 mg | ORAL_TABLET | Freq: Every day | ORAL | 3 refills | Status: DC
Start: 2021-04-21 — End: 2022-06-25

## 2021-04-21 MED ORDER — ALBUTEROL SULFATE HFA 108 (90 BASE) MCG/ACT IN AERS: 2.0000 | INHALATION_SPRAY | Freq: Four times a day (QID) | RESPIRATORY_TRACT | 5 refills | Status: DC | PRN

## 2021-04-21 NOTE — Patient Instructions (Signed)
Please remember to use the Cologuard test and mail it in so that you can be screened for colon cancer.  You should use your oxygen when you are up moving around.

## 2021-04-21 NOTE — Progress Notes (Signed)
Patient ID: Timothy [name], male    DOB: November 25, 1955  MRN: [id number]  CC: Transition of care visit Date of hospitalization:  11/30-12/11/2020 Date of call from case worker: 04/10/2021  Subjective: Timothy [name] is a 65 y.o. male who presents for [facility] His concerns today include:  hx of HTN, cardiac arrest vfib/vtach 06/2013, CAD with stent to RCA, COPD Gold stage III, former smoker, pre-DM, lung nodule on CT 08/2015 and bladder CA.  Patient hospitalized with cough, wheezing and shortness of breath.  CT angiogram was negative for pulmonary embolism.  Test for COVID was negative.  He tested positive for influenza A.  He was admitted with acute on chronic hypoxic respiratory failure secondary to COPD exacerbation secondary to the flu.  Patient started on Tamiflu, IV steroid and bronchodilators.  He improved clinically and was able to be discharged home with saturations of 92% on room air.  White blood cell count noted to be elevated while in hospital.  Today: COPD/influenza: He reports that his breathing is getting better each day.  Uses O2 when he feels he needs it.  He has been checking pulse ox at home.  Oxygen level drops into the 80s when he is up moving around. -Reports compliance with Breztri inhaler.  Uses albuterol inhaler only as needed but not every day.  He needs a refill. - Overdue for pneumonia and flu vaccine.  He is agreeable to receiving those today. -Has appointment with his pulmonologist Dr. Melvyn Novas in February of next year.  CAD/HTN: Reports compliance with bisoprolol, Lipitor and Brilinta.  Takes aspirin a few times a week.  He tells me he thinks he is supposed to take it a few times a week.  Denies any previous history of gastric ulcers.  No recent use of sublingual nitroglycerin.  Denies any recent chest pains.  No lower extremity edema.  Prediabetes: Not taking metformin consistently.  Last A1c done a few weeks ago was 5.9.  HM: Due for flu and pneumonia vaccine.  Cologuard  ordered on visit with me in November of last year.  Patient reports receiving the kit but he has not turned it in as yet.  States that he will do so. Patient Active Problem List   Diagnosis Date Noted   Influenza A 04/09/2021   Acute on chronic respiratory failure with hypoxia ([place]) 04/09/2021   COPD exacerbation ([place]) 04/03/2021   COPD (chronic obstructive pulmonary disease) ([place]) 04/02/2021   Exercise hypoxemia 07/02/2020   Acute STEMI of inferior wall  02/24/2020   Malignant neoplasm of urinary bladder ([place]) 02/23/2017   Lung nodule 11/20/2016   Gastroesophageal reflux disease without esophagitis 11/20/2016   Prediabetes 12/29/2013   Dental cavities 12/29/2013   DOE (dyspnea on exertion) 08/30/2013   Essential hypertension 08/03/2013   CAD S/P percutaneous coronary angioplasty 06/19/2013   Hyperlipidemia with target LDL less than 70 06/19/2013   Former smoker 06/19/2013   NSTEMI (non-ST elevated myocardial infarction), DES to RCA 06/13/2013   VT (ventricular tachycardia), requiring 2 shocks 06/12/2013   COPD GOLD III 06/12/2013     Current Outpatient Medications on File Prior to Visit  Medication Sig Dispense Refill   acetaminophen (TYLENOL) 500 MG tablet Take 500-1,000 mg by mouth every 8 (eight) hours as needed for mild pain or headache.      aspirin EC 81 MG tablet Take 1 tablet (81 mg total) by mouth daily. (Patient taking differently: Take 81 mg by mouth 2 (two) times a week.) 90 tablet 2  Budeson-Glycopyrrol-Formoterol (BREZTRI AEROSPHERE) 160-9-4.8 MCG/ACT AERO Inhale 2 puffs into the lungs 2 (two) times daily. 5.9 g 0   famotidine (PEPCID) 20 MG tablet Take 20 mg by mouth daily as needed for heartburn or indigestion.     metFORMIN (GLUCOPHAGE) 500 MG tablet Take 0.5 tablets (250 mg total) by mouth daily with breakfast. 30 tablet 3   nitroGLYCERIN (NITROSTAT) 0.4 MG SL tablet Place 1 tablet (0.4 mg total) under the tongue every 5 (five) minutes as needed for chest pain. NEED  OV. 25 tablet 2   ticagrelor (BRILINTA) 90 MG TABS tablet Take 1 tablet (90 mg total) by mouth 2 (two) times daily. 180 tablet 3   No current facility-administered medications on file prior to visit.    Allergies  Allergen Reactions   Ace Inhibitors Other (See Comments)    Caused wheezing   Aspirin Swelling and Other (See Comments)    Lips swell- still takes approx 2 times a week in 2021   Ibuprofen Swelling and Other (See Comments)    Lips swell   Losartan Other (See Comments)    Leg cramps    Social History   Socioeconomic History   Marital status: Divorced    Spouse name: Not on file   Number of children: Not on file   Years of education: Not on file   Highest education level: Not on file  Occupational History   Not on file  Tobacco Use   Smoking status: Former    Packs/day: 2.00    Years: 38.00    Pack years: 76.00    Types: Cigarettes    Quit date: 07/10/2008    Years since quitting: 12.7   Smokeless tobacco: Never  Vaping Use   Vaping Use: Never used  Substance and Sexual Activity   Alcohol use: No   Drug use: No    Types: Marijuana   Sexual activity: Yes  Other Topics Concern   Not on file  Social History Narrative   Not on file   Social Determinants of Health   Financial Resource Strain: Not on file  Food Insecurity: Not on file  Transportation Needs: Not on file  Physical Activity: Not on file  Stress: Not on file  Social Connections: Not on file  Intimate Partner Violence: Not on file    Family History  Problem Relation Age of Onset   Hypertension Other     Past Surgical History:  Procedure Laterality Date   cardiac sstent     CORONARY ANGIOPLASTY WITH STENT PLACEMENT  06/12/13   [facility] [place] DES to RCA, NSTEMI   CORONARY STENT INTERVENTION N/A 02/24/2020   Procedure: CORONARY STENT INTERVENTION;  Surgeon: Belva Crome, MD;  Location: [facility];  Service: Cardiovascular;  Laterality: N/A;   CORONARY/GRAFT ACUTE MI  REVASCULARIZATION N/A 02/24/2020   Procedure: Coronary/Graft Acute MI Revascularization;  Surgeon: Belva Crome, MD;  Location: [facility];  Service: Cardiovascular;  Laterality: N/A;   CYSTOSCOPY W/ RETROGRADES Left 03/29/2017   Procedure: CYSTOSCOPY WITH LEFT RETROGRADE PYELOGRAM;  Surgeon: Kathie Rhodes, MD;  Location: [facility];  Service: Urology;  Laterality: Left;   LEFT HEART CATH AND CORONARY ANGIOGRAPHY N/A 02/24/2020   Procedure: LEFT HEART CATH AND CORONARY ANGIOGRAPHY;  Surgeon: Belva Crome, MD;  Location: [facility];  Service: Cardiovascular;  Laterality: N/A;   LEFT HEART CATHETERIZATION WITH CORONARY ANGIOGRAM N/A 06/12/2013   Procedure: LEFT HEART CATHETERIZATION WITH CORONARY ANGIOGRAM;  Surgeon: Troy Sine,  MD;  Location: [facility];  Service: Cardiovascular;  Laterality: N/A;   NO PAST SURGERIES     TRANSURETHRAL RESECTION OF BLADDER TUMOR N/A 03/29/2017   Procedure: TRANSURETHRAL RESECTION OF BLADDER TUMOR (TURBT)/;  Surgeon: Kathie Rhodes, MD;  Location: [facility];  Service: Urology;  Laterality: N/A;   transurethral ressection of bladder tumor     Dr. Karsten Ro 03/29/17    ROS: Review of Systems Negative except as stated above  PHYSICAL EXAM: BP 117/75    Pulse 76    Ht _0  (1.676 m)    Wt 124 lb 12.8 oz (56.6 kg)    SpO2 94%    BMI 20.14 kg/m   Physical Exam  General appearance - alert, well appearing, and in no distress Mental status - normal mood, behavior, speech, dress, motor activity, and thought processes Neck - supple, no significant adenopathy Chest -breath sounds moderately decreased.  No wheezes or crackles heard. Heart - normal rate, regular rhythm, normal S1, S2, no murmurs, rubs, clicks or gallops Extremities - peripheral pulses normal, no pedal edema, no clubbing or cyanosis   CMP Latest Ref Rng & Units 04/09/2021 04/07/2021 04/06/2021  Glucose 70 - 99 mg/dL 116(H) 120(H) 146(H)  BUN 8 - 23 mg/dL 21 21 25(H)  Creatinine 0.61 -  1.24 mg/dL 0.78 0.84 0.86  Sodium 135 - 145 mmol/L 131(L) 133(L) 133(L)  Potassium 3.5 - 5.1 mmol/L 4.9 4.5 5.0  Chloride 98 - 111 mmol/L 97(L) 99 97(L)  CO2 22 - 32 mmol/L _1 Calcium 8.9 - 10.3 mg/dL 8.8(L) 8.9 9.3  Total Protein 6.5 - 8.1 g/dL - - 6.9  Total Bilirubin 0.3 - 1.2 mg/dL - - 0.8  Alkaline Phos 38 - 126 U/L - - 63  AST 15 - 41 U/L - - 24  ALT 0 - 44 U/L - - 32   Lipid Panel     Component Value Date/Time   CHOL 268 (H) 02/24/2020 1253   CHOL 232 (H) 11/20/2016 0953   TRIG 212 (H) 02/24/2020 1253   HDL 48 02/24/2020 1253   HDL 50 11/20/2016 0953   CHOLHDL 5.6 02/24/2020 1253   VLDL 42 (H) 02/24/2020 1253   LDLCALC NOT CALCULATED 02/24/2020 1253   LDLCALC 153 (H) 11/20/2016 0953    CBC    Component Value Date/Time   WBC 17.7 (H) 04/07/2021 1041   RBC 5.39 04/07/2021 1041   HGB 16.6 04/07/2021 1041   HGB 17.3 11/20/2016 0953   HCT 49.3 04/07/2021 1041   HCT 50.3 11/20/2016 0953   PLT 383 04/07/2021 1041   PLT 353 11/20/2016 0953   MCV 91.5 04/07/2021 1041   MCV 90 11/20/2016 0953   MCH 30.8 04/07/2021 1041   MCHC 33.7 04/07/2021 1041   RDW 13.2 04/07/2021 1041   RDW 13.8 11/20/2016 0953   LYMPHSABS 1.2 04/07/2021 1041   MONOABS 2.4 (H) 04/07/2021 1041   EOSABS 0.0 04/07/2021 1041   BASOSABS 0.0 04/07/2021 1041    ASSESSMENT AND PLAN: 1. Hospital discharge follow-up   2. COPD GOLD III 3. Chronic respiratory failure with hypoxia, on home O2 therapy (HCC) -Continue Breztri inhaler twice a day.  Continue albuterol inhaler as needed. Advised patient to use his oxygen with ambulation. He will be given flu vaccine and PCV 15 today.  Keep upcoming appointment with his pulmonologist in February. - albuterol (VENTOLIN HFA) 108 (90 Base) MCG/ACT inhaler; Inhale 2 puffs into the lungs every 6 (six) hours as needed for  wheezing or shortness of breath.  Dispense: 8 g; Refill: 5  4. Coronary artery disease involving native coronary artery of native heart  without angina pectoris Stable.  Overdue for follow-up with his cardiologist.  Advised that he call and schedule with Dr. Claiborne Billings.  Take baby aspirin daily. - bisoprolol (ZEBETA) 5 MG tablet; Take 0.5 tablets (2.5 mg total) by mouth daily.  Dispense: 45 tablet; Refill: 3 - atorvastatin (LIPITOR) 80 MG tablet; Take 1 tablet (80 mg total) by mouth at bedtime.  Dispense: 90 tablet; Refill: 3  5. Prediabetes Continue metformin and healthy eating habits.  6. Leukocytosis, unspecified type Likely due to steroid.  We will recheck CBC with differential today - CBC With Differential  7. Need for immunization against influenza - Flu Vaccine QUAD 46moIM (Fluarix, Fluzone & Alfiuria Quad PF)  8. Need for Streptococcus pneumoniae vaccination - PNEUMOCOCCAL CONJUGATE VACCINE 15-VALENT  9. Screening for colon cancer Strongly encouraged him to use the Cologuard test that he has at home and mail it in to complete his colon cancer screening.    Patient was given the opportunity to ask questions.  Patient verbalized understanding of the plan and was able to repeat key elements of the plan.   Orders Placed This Encounter  Procedures   Flu Vaccine QUAD 635moM (Fluarix, Fluzone & Alfiuria Quad PF)   PNEUMOCOCCAL CONJUGATE VACCINE 15-VALENT   CBC With Differential     Requested Prescriptions   Signed Prescriptions Disp Refills   bisoprolol (ZEBETA) 5 MG tablet 45 tablet 3    Sig: Take 0.5 tablets (2.5 mg total) by mouth daily.   atorvastatin (LIPITOR) 80 MG tablet 90 tablet 3    Sig: Take 1 tablet (80 mg total) by mouth at bedtime.   albuterol (VENTOLIN HFA) 108 (90 Base) MCG/ACT inhaler 8 g 5    Sig: Inhale 2 puffs into the lungs every 6 (six) hours as needed for wheezing or shortness of breath.    Return in about 4 months (around 08/20/2021).  DeKarle PlumberMD, FACP

## 2021-04-22 ENCOUNTER — Telehealth: Payer: Self-pay

## 2021-04-22 LAB — CBC WITH DIFFERENTIAL
Basophils Absolute: 0.1 10*3/uL (ref 0.0–0.2)
Basos: 0 %
EOS (ABSOLUTE): 0.1 10*3/uL (ref 0.0–0.4)
Eos: 1 %
Hematocrit: 45.7 % (ref 37.5–51.0)
Hemoglobin: 15.8 g/dL (ref 13.0–17.7)
Immature Grans (Abs): 0.1 10*3/uL (ref 0.0–0.1)
Immature Granulocytes: 1 %
Lymphocytes Absolute: 1.7 10*3/uL (ref 0.7–3.1)
Lymphs: 14 %
MCH: 31 pg (ref 26.6–33.0)
MCHC: 34.6 g/dL (ref 31.5–35.7)
MCV: 90 fL (ref 79–97)
Monocytes Absolute: 1.4 10*3/uL — ABNORMAL HIGH (ref 0.1–0.9)
Monocytes: 11 %
Neutrophils Absolute: 9 10*3/uL — ABNORMAL HIGH (ref 1.4–7.0)
Neutrophils: 73 %
RBC: 5.09 x10E6/uL (ref 4.14–5.80)
RDW: 12.2 % (ref 11.6–15.4)
WBC: 12.3 10*3/uL — ABNORMAL HIGH (ref 3.4–10.8)

## 2021-04-22 NOTE — Telephone Encounter (Signed)
Contacted pt to go over lab results pt is aware and doesn't have any questions or concerns 

## 2021-05-13 ENCOUNTER — Other Ambulatory Visit: Payer: Self-pay | Admitting: Student

## 2021-05-13 NOTE — Telephone Encounter (Signed)
This is Dr. Kelly's pt. °

## 2021-05-24 ENCOUNTER — Other Ambulatory Visit: Payer: Self-pay | Admitting: Cardiovascular Disease

## 2021-05-24 DIAGNOSIS — I2510 Atherosclerotic heart disease of native coronary artery without angina pectoris: Secondary | ICD-10-CM

## 2021-05-29 ENCOUNTER — Telehealth: Payer: Self-pay | Admitting: Internal Medicine

## 2021-05-29 MED ORDER — BREZTRI AEROSPHERE 160-9-4.8 MCG/ACT IN AERO: 2.0000 | INHALATION_SPRAY | Freq: Two times a day (BID) | RESPIRATORY_TRACT | 5 refills | Status: DC

## 2021-05-29 NOTE — Telephone Encounter (Signed)
Called and spoke with pt who stated that he needed to have Rx for [facility] sent to pharmacy. Rx has been sent to preferred pharmacy for pt. Nothing further needed.

## 2021-06-20 ENCOUNTER — Ambulatory Visit: Payer: Medicare Other | Admitting: Internal Medicine

## 2021-06-30 ENCOUNTER — Ambulatory Visit: Payer: Medicare Other | Admitting: Internal Medicine

## 2021-06-30 ENCOUNTER — Encounter: Payer: Self-pay | Admitting: Internal Medicine

## 2021-06-30 ENCOUNTER — Other Ambulatory Visit: Payer: Self-pay

## 2021-06-30 DIAGNOSIS — J449 Chronic obstructive pulmonary disease, unspecified: Secondary | ICD-10-CM | POA: Diagnosis not present

## 2021-06-30 DIAGNOSIS — R0902 Hypoxemia: Secondary | ICD-10-CM | POA: Diagnosis not present

## 2021-06-30 NOTE — Assessment & Plan Note (Signed)
Reported 07/01/2020  - 12/18/2020   patient walked at a slow pace x 3 laps @ 250 ft each  and oxygen dropped to 87 % during 2lap and oxygen was placed and patient come back up to 93% on 2 liters> ordered . - 06/30/2021 Best fit eval for amb 02  (discussed with Melissa at [facility])   Advised: Make sure you check your oxygen saturation  AT  your highest level of activity (not after you stop)   to be sure it stays over 90% and adjust  02 flow upward to maintain this level if needed but remember to turn it back to previous settings when you stop (to conserve your supply).          Each maintenance medication was reviewed in detail including emphasizing most importantly the difference between maintenance and prns and under what circumstances the prns are to be triggered using an action plan format where appropriate.  Total time for H and P, chart review, counseling, reviewing hfa/02 device(s) and generating customized AVS unique to this office visit / same day charting  30 min

## 2021-06-30 NOTE — Assessment & Plan Note (Signed)
MM/quit smoking in 2010  - HFA 75% p coaching 09/01/13 > started on symbicort 160 2 bid  - Spirometry 09/15/13  FEV1 1.67 (49%) ratio 44 % - 05/09/2020   breztri 2bid -  05/09/2020   Walked RA  2 laps @ approx 276ft each @ avg pace  stopped due sob / sats 93%  - alpha one AT phenotype  MM,  Level 182  - Allergy profile 05/09/20 >  Eos 0.6/  IgE  162  - PFT's  07/01/2020  FEV1 1.35 (46 % ) ratio 0.48 p 6 % improvement from saba p breztri x 2 prior to study with DLCO  9.40 (40%) corrects to 1.97 (45%)  for alv volume and FV curve classic concavity   - 07/01/2020  After extensive coaching inhaler device,  effectiveness =  80%    Group D in terms of symptom/risk and laba/lama/ICS  therefore appropriate rx at this point >>>  Continue breztri and approp saba

## 2021-06-30 NOTE — Progress Notes (Signed)
Timothy [name], male    DOB: 20-May-1955,  MRN: [id number]   Brief patient profile:   55 yowm MM/quit smoking 2010 with GOLD III criteria 2015 maint on symbicort 160 2bid and losing ground with ex tol so referred to pulmonary clinic 05/09/2020 by Dr   Wynetta Emery   Retired [employer]    History of Present Illness  05/09/2020  Pulmonary/ 1st office eval/Timothy [name] symb maint / insurance would not pay spiriva Chief Complaint  Patient presents with   Pulmonary Consult     Referred by Dr Karle Plumber. Pt seen here last in 2015 for COPD. He c/o increased SOB over the past 5 years. He states he gets SOB walking up hill and "not too far".  He is using his albuterol inhaler at 4-6 x per wk on average.    Dyspnea:  Grocery shopping limit is [employer] - can't do [employer] anymore, mailbox is 50 ft and no hill steps at slower pace = MMRC3 = can't walk 100 yards even at a slow pace at a flat grade s stopping due to sob   Cough: am congestion/ white  Sleep: on side two pillows flat bed  SABA use: saba hfa  rec Plan A = Automatic = Always=    Stop symbicort and start Breztri Take 2 puffs first thing in am and then another 2 puffs about 12 hours later.  Work on inhaler technique:  Plan B = Backup (to supplement plan A, not to replace it) Only use your albuterol inhaler as a rescue medication Plan C = Crisis (instead of Plan B but only if Plan B stops working) - only use your albuterol nebulizer if you first try Plan B and it fails to help > ok to use the nebulizer up to every 4 hours but if start needing it regularly call for immediate appointment     12/18/2020  f/u ov/Timothy [name] re: GOLD III maint on breztri/ desats with 02  Chief Complaint  Patient presents with   Follow-up    Patient reports doing well and no concerns.   Dyspnea:  struggles to go down to feed dogs and back to the house  Cough: occ in ams/ white mucus Sleeping: ok on side bed is flat SABA use: once every 2 -3 days  02: none  Covid  status:   vax x 3  Rec Try albuterol 15 min before an activity (on alternating days)  that you know would make you short of breath  We will see if you qualify for 02 today  > goal is to keep above 90% walking  - 12/18/2020   patient walked at a slow pace x 3 laps @ 250 ft each  and oxygen dropped to 87 % during 2lap and oxygen was placed and patient come back up to 93% on 2 liters> ordered    06/30/2021  f/u ov/Timothy [name] re: GOLD 3/  maint on breztri  not using amb 02 "too heavy"  Chief Complaint  Patient presents with   Follow-up    Breathing is unchanged since the last visit. He has occ cough with white sputum. He is using his albuterol inhaler 3-4 x per wk.   Dyspnea:  less active now / not wearing 02 as rec with ex  Cough: some in am only  Sleeping: flat bed one pillow SABA use: as above/ rarely uses before ex 02: prn 2lpm      No obvious day to day or daytime variability or assoc excess/  purulent sputum or mucus plugs or hemoptysis or cp or chest tightness, subjective wheeze or overt sinus or hb symptoms.   Sleeping  without nocturnal  or early am exacerbation  of respiratory  c/o's or need for noct saba. Also denies any obvious fluctuation of symptoms with weather or environmental changes or other aggravating or alleviating factors except as outlined above   No unusual exposure hx or h/o childhood pna/ asthma or knowledge of premature birth.  Current Allergies, Complete Past Medical History, Past Surgical History, Family History, and Social History were reviewed in [employer] record.  ROS  The following are not active complaints unless bolded Hoarseness, sore throat, dysphagia, dental problems, itching, sneezing,  nasal congestion or discharge of excess mucus or purulent secretions, ear ache,   fever, chills, sweats, unintended wt loss or wt gain, classically pleuritic or exertional cp,  orthopnea pnd or arm/hand swelling  or leg swelling, presyncope,  palpitations, abdominal pain, anorexia, nausea, vomiting, diarrhea  or change in bowel habits or change in bladder habits, change in stools or change in urine, dysuria, hematuria,  rash, arthralgias, visual complaints, headache, numbness, weakness or ataxia or problems with walking or coordination,  change in mood or  memory.        Current Meds  Medication Sig   acetaminophen (TYLENOL) 500 MG tablet Take 500-1,000 mg by mouth every 8 (eight) hours as needed for mild pain or headache.    albuterol (VENTOLIN HFA) 108 (90 Base) MCG/ACT inhaler Inhale 2 puffs into the lungs every 6 (six) hours as needed for wheezing or shortness of breath.   aspirin EC 81 MG tablet Take 1 tablet (81 mg total) by mouth daily. (Patient taking differently: Take 81 mg by mouth 2 (two) times a week.)   atorvastatin (LIPITOR) 80 MG tablet Take 1 tablet (80 mg total) by mouth at bedtime.   bisoprolol (ZEBETA) 5 MG tablet TAKE 1/2 TABLET(2.5 MG) BY MOUTH AT BEDTIME   BRILINTA 90 MG TABS tablet TAKE 1 TABLET(90 MG) BY MOUTH TWICE DAILY   Budeson-Glycopyrrol-Formoterol (BREZTRI AEROSPHERE) 160-9-4.8 MCG/ACT AERO Inhale 2 puffs into the lungs 2 (two) times daily.   famotidine (PEPCID) 20 MG tablet Take 20 mg by mouth daily as needed for heartburn or indigestion.   metFORMIN (GLUCOPHAGE) 500 MG tablet Take 0.5 tablets (250 mg total) by mouth daily with breakfast.   nitroGLYCERIN (NITROSTAT) 0.4 MG SL tablet Place 1 tablet (0.4 mg total) under the tongue every 5 (five) minutes as needed for chest pain. NEED OV.                  Past Medical History:  Diagnosis Date   Altered mental status, improved at discharge 06/19/2013   Bronchitis, chronic ([place]) 06/19/2013   CAD (coronary artery disease)    a. s/p cardiac arrest in 06/2013 with DES to RCA b. 02/2020: Inferior STEMI and cath showing total occlusion of mid-RCA treated with DES placement.    Cancer [facility])    bladder   Cardiac arrest, hypothermic protocol 06/12/2013    COPD exacerbation ([place]) 06/12/2013   GERD (gastroesophageal reflux disease)    Hyperlipidemia LDL goal < 70 06/19/2013   Hypokalemia, replaced 06/19/2013   Hypomagnesemia, replaced 06/19/2013   Hypotension, requiring levophed 06/19/2013   NSTEMI (non-ST elevated myocardial infarction), DES to RCA 06/13/2013   Pre-diabetes    Tobacco abuse 06/19/2013        Objective:    06/30/2021       125  12/18/2020       126  07/01/2020       132   06/20/20 133 lb (60.3 kg)  05/09/20 131 lb (59.4 kg)  03/07/20 138 lb (62.6 kg)    Vital signs reviewed  06/30/2021  - Note at rest 02 sats  93% on RA   General appearance:    amb wm nad    HEENT : pt wearing mask not removed for exam due to covid -19 concerns.    NECK :  without JVD/Nodes/TM/ nl carotid upstrokes bilaterally   LUNGS: no acc muscle use,  Mod barrel  contour chest wall with bilateral  Distant bs s audible wheeze and  without cough on insp or exp maneuvers and mod  Hyperresonant  to  percussion bilaterally     CV:  RRR  no s3 or murmur or increase in P2, and no edema   ABD:  soft and nontender with pos mid insp Hoover's  in the supine position. No bruits or organomegaly appreciated, bowel sounds nl  MS:     ext warm without deformities, calf tenderness, cyanosis or clubbing No obvious joint restrictions   SKIN: warm and dry without lesions    NEURO:  alert, approp, nl sensorium with  no motor or cerebellar deficits apparent.                         Assessment

## 2021-06-30 NOTE — Patient Instructions (Addendum)
We will be referring you for best fit for ambulatory 02   Please schedule a follow up visit in 3 months but call sooner if needed - please bring your 0xygen with you   .

## 2021-07-14 ENCOUNTER — Other Ambulatory Visit: Payer: Self-pay

## 2021-07-14 DIAGNOSIS — J449 Chronic obstructive pulmonary disease, unspecified: Secondary | ICD-10-CM

## 2021-08-19 ENCOUNTER — Ambulatory Visit: Payer: Medicare Other | Attending: Internal Medicine

## 2021-08-19 DIAGNOSIS — Z91199 Patient's noncompliance with other medical treatment and regimen due to unspecified reason: Secondary | ICD-10-CM

## 2021-08-19 NOTE — Progress Notes (Signed)
Contacted pt to start Medicare wellness pt didn't answer. If pt calls back please reschedule  ?

## 2021-08-20 NOTE — Progress Notes (Signed)
?Cardiology Office Note:   ? ?Date:  08/21/2021  ? ?ID:  Timothy [name], DOB Jan 28, 1956, MRN [id number] ? ?PCP:  Ladell Pier, MD ?[place] Cardiologist: Shelva Majestic, MD  ? ?Reason for visit: 74-monthfollow-up (overdue) ? ?History of Present Illness:   ? ?Timothy KEHOEis a 66y.o. male with a hx of cardiac arrest in 2015 with PCI to the RCA , s/p transurethral resection of bladder tumor and intravesical chemotherapy.  Patient went off meds for some time.  He then had STEMI in 2018 and found to have total mid RCA occlusion which was successfully stented with overlapping Resolute stents.  He quit smoking 12 years ago. ? ?He was last seen by Dr. KClaiborne Billingsin March 2022.  He continued on DAPT with aspirin & Brilinta which is recommended long-term. ? ?Today, he states he is doing fair.  He has chronic dyspnea on exertion secondary to COPD.  He does feel like it is gradually getting worse.  He had a severe case of flu in December and was hospitalized for a week.  He states he initially required 10 L of oxygen.  He now uses oxygen when needed particularly after walking a distance.  He has chest pain that occurs sporadically, not purely exertional.  Last a few seconds.  This is chronic and without change.  He does request refill for sublingual nitroglycerin. ? ?Otherwise, he states he is tolerates medications well.  He has had no bleeding on Brilinta 90 mg twice daily and a baby aspirin.  He denies PND, orthopnea, palpitations, lightheadedness, syncope. ? ?  ?Past Medical History:  ?Diagnosis Date  ? Altered mental status, improved at discharge 06/19/2013  ? Bronchitis, chronic ([place] 06/19/2013  ? CAD (coronary artery disease)   ? a. s/p cardiac arrest in 06/2013 with DES to RCA b. 02/2020: Inferior STEMI and cath showing total occlusion of mid-RCA treated with DES placement.   ? [place]   ? bladder  ? Cardiac arrest, hypothermic protocol 06/12/2013  ? [place]ation [place] 06/12/2013  ? GERD  (gastroesophageal reflux disease)   ? Hyperlipidemia LDL goal < 70 06/19/2013  ? Hypokalemia, replaced 06/19/2013  ? Hypomagnesemia, replaced 06/19/2013  ? Hypotension, requiring levophed 06/19/2013  ? NSTEMI (non-ST elevated myocardial infarction), DES to RCA 06/13/2013  ? Pre-diabetes   ? Tobacco abuse 06/19/2013  ? ? ?Past Surgical History:  ?Procedure Laterality Date  ? cardiac sstent    ? CORONARY ANGIOPLASTY WITH STENT PLACEMENT  06/12/13  ? Xience Alpine DES to RCA, NSTEMI  ? CORONARY STENT INTERVENTION N/A 02/24/2020  ? Procedure: CORONARY STENT INTERVENTION;  Surgeon: SBelva Crome MD;  [facility];  Service: Cardiovascular;  Laterality: N/A;  ? CORONARY/GRAFT ACUTE MI REVASCULARIZATION N/A 02/24/2020  ? Procedure: Coronary/Graft Acute MI Revascularization;  Surgeon: SBelva Crome MD;  [facility];  Service: Cardiovascular;  Laterality: N/A;  ? CYSTOSCOPY W/ RETROGRADES Left 03/29/2017  ? Procedure: CYSTOSCOPY WITH LEFT RETROGRADE PYELOGRAM;  Surgeon: OKathie Rhodes MD;  Location: [facility];  Service: Urology;  Laterality: Left;  ? LEFT HEART CATH AND CORONARY ANGIOGRAPHY N/A 02/24/2020  ? Procedure: LEFT HEART CATH AND CORONARY ANGIOGRAPHY;  Surgeon: SBelva Crome MD;  [facility];  Service: Cardiovascular;  Laterality: N/A;  ? LEFT HEART CATHETERIZATION WITH CORONARY ANGIOGRAM N/A 06/12/2013  ? Procedure: LEFT HEART CATHETERIZATION WITH CORONARY ANGIOGRAM;  Surgeon: TTroy Sine MD;  [facility];  Service: Cardiovascular;  Laterality: N/A;  ? NO PAST SURGERIES    ? TRANSURETHRAL RESECTION OF BLADDER TUMOR N/A 03/29/2017  ? Procedure: TRANSURETHRAL RESECTION OF BLADDER TUMOR (TURBT)/;  Surgeon: Kathie Rhodes, MD;  Location: [facility];  Service: Urology;  Laterality: N/A;  ? transurethral ressection of bladder tumor    ? Dr. Karsten Ro 03/29/17  ? ? ?Current Medications: ?Current Meds  ?Medication Sig  ? acetaminophen (TYLENOL) 500 MG tablet Take 500-1,000  mg by mouth every 8 (eight) hours as needed for mild pain or headache.   ? albuterol (VENTOLIN HFA) 108 (90 Base) MCG/ACT inhaler Inhale 2 puffs into the lungs every 6 (six) hours as needed for wheezing or shortness of breath.  ? aspirin EC 81 MG tablet Take 1 tablet (81 mg total) by mouth daily. (Patient taking differently: Take 81 mg by mouth 2 (two) times a week.)  ? atorvastatin (LIPITOR) 80 MG tablet Take 1 tablet (80 mg total) by mouth at bedtime.  ? bisoprolol (ZEBETA) 5 MG tablet TAKE 1/2 TABLET(2.5 MG) BY MOUTH AT BEDTIME  ? BRILINTA 90 MG TABS tablet TAKE 1 TABLET(90 MG) BY MOUTH TWICE DAILY  ? Budeson-Glycopyrrol-Formoterol (BREZTRI AEROSPHERE) 160-9-4.8 MCG/ACT AERO Inhale 2 puffs into the lungs 2 (two) times daily.  ? famotidine (PEPCID) 20 MG tablet Take 20 mg by mouth daily as needed for heartburn or indigestion.  ? metFORMIN (GLUCOPHAGE) 500 MG tablet Take 0.5 tablets (250 mg total) by mouth daily with breakfast.  ? [DISCONTINUED] nitroGLYCERIN (NITROSTAT) 0.4 MG SL tablet Place 1 tablet (0.4 mg total) under the tongue every 5 (five) minutes as needed for chest pain. NEED OV.  ?  ? ?Allergies:   Ace inhibitors, Aspirin, Ibuprofen, and Losartan  ? ?Social History  ? ?Socioeconomic History  ? Marital status: Divorced  ?  Spouse name: Not on file  ? Number of children: Not on file  ? Years of education: Not on file  ? Highest education level: Not on file  ?Occupational History  ? Not on file  ?Tobacco Use  ? Smoking status: Former  ?  Packs/day: 2.00  ?  Years: 38.00  ?  Pack years: 76.00  ?  Types: Cigarettes  ?  Quit date: 07/10/2008  ?  Years since quitting: 13.1  ? Smokeless tobacco: Never  ?Vaping Use  ? Vaping Use: Never used  ?Substance and Sexual Activity  ? Alcohol use: No  ? Drug use: No  ?  Types: Marijuana  ? Sexual activity: Yes  ?Other Topics Concern  ? Not on file  ?Social History Narrative  ? Not on file  ? ?Social Determinants of Health  ? ?Financial Resource Strain: Not on file  ?Food  Insecurity: Not on file  ?Transportation Needs: Not on file  ?Physical Activity: Not on file  ?Stress: Not on file  ?Social Connections: Not on file  ?  ? ?Family History: ?The patient's family history includes Hypertension in an other family member. ? ?ROS:   ?Please see the history of present illness.    ? ?EKGs/Labs/Other Studies Reviewed:   ? ?EKG:  The ekg ordered today demonstrates normal sinus rhythm, heart rate 64. ? ?Recent Labs: ?04/02/2021: B Natriuretic Peptide 59.1 ?04/06/2021: ALT 32 ?04/07/2021: Magnesium 2.3; Platelets 383 ?04/09/2021: BUN 21; Creatinine, Ser 0.78; Potassium 4.9; Sodium 131 ?04/21/2021: Hemoglobin 15.8  ? ?Recent Lipid Panel ?Lab Results  ?Component Value Date/Time  ? CHOL 268 (H) 02/24/2020 12:53 PM  ? CHOL 232 (H) 11/20/2016 09:53 AM  ?  TRIG 212 (H) 02/24/2020 12:53 PM  ? HDL 48 02/24/2020 12:53 PM  ? HDL 50 11/20/2016 09:53 AM  ? LDLCALC NOT CALCULATED 02/24/2020 12:53 PM  ? LDLCALC 153 (H) 11/20/2016 09:53 AM  ? ? ?Physical Exam:   ? ?VS:  BP 132/74   Pulse 64   Ht '5\' 6"'$  (1.676 m)   Wt 127 lb 12.8 oz (58 kg)   SpO2 93%   BMI 20.63 kg/m?    ?No data found. ? ?Wt Readings from Last 3 Encounters:  ?08/21/21 127 lb 12.8 oz (58 kg)  ?06/30/21 125 lb 12.8 oz (57.1 kg)  ?04/21/21 124 lb 12.8 oz (56.6 kg)  ?  ? ?GEN:  Well nourished, well developed in no acute distress ?HEENT: Normal ?NECK: No JVD; No carotid bruits ?CARDIAC: RRR, no murmurs, rubs, gallops ?RESPIRATORY:  Clear to auscultation without rales, wheezing or rhonchi  ?ABDOMEN: Soft, non-tender, non-distended ?MUSCULOSKELETAL: No edema; No deformity  ?SKIN: Warm and dry ?NEUROLOGIC:  Alert and oriented ?PSYCHIATRIC:  Normal affect  ? ?  ?ASSESSMENT AND PLAN  ? ?Coronary artery disease ?-STEMI 06/2013 with VF arrest; DES to RCA ?-STEMI 02/2020 with tandem stents placed to RCA (off medications at the time) ?-Echo 2021 with EF of 55 to 60% ?-Recommend DAPT long-term with aspirin & Brilinta. ?-Continue beta-blocker and statin  therapy. ?-If chest pain occurs more frequently or lasts longer, could consider adding Imdur. ? ?Hypertension, reasonably controlled ?-Continue Bisoprolol.  He states he cannot tolerate higher dose secondary to fatigue. ?-[place]

## 2021-08-21 ENCOUNTER — Encounter: Payer: Self-pay | Admitting: Physician Assistant

## 2021-08-21 ENCOUNTER — Ambulatory Visit: Payer: Medicare Other | Admitting: Physician Assistant

## 2021-08-21 VITALS — BP 132/74 | HR 64 | Ht 66.0 in | Wt 127.8 lb

## 2021-08-21 DIAGNOSIS — I2510 Atherosclerotic heart disease of native coronary artery without angina pectoris: Secondary | ICD-10-CM

## 2021-08-21 DIAGNOSIS — E785 Hyperlipidemia, unspecified: Secondary | ICD-10-CM | POA: Diagnosis not present

## 2021-08-21 DIAGNOSIS — I25118 Atherosclerotic heart disease of native coronary artery with other forms of angina pectoris: Secondary | ICD-10-CM | POA: Diagnosis not present

## 2021-08-21 DIAGNOSIS — I10 Essential (primary) hypertension: Secondary | ICD-10-CM

## 2021-08-21 DIAGNOSIS — Z9861 Coronary angioplasty status: Secondary | ICD-10-CM

## 2021-08-21 MED ORDER — NITROGLYCERIN 0.4 MG SL SUBL: 0.4000 mg | SUBLINGUAL_TABLET | SUBLINGUAL | 2 refills | Status: DC | PRN

## 2021-08-21 NOTE — Patient Instructions (Signed)
Medication Instructions:  ?No changes ?*If you need a refill on your cardiac medications before your next appointment, please call your pharmacy* ? ? ?Lab Work: ?Lipid Panel . In 1 Week. ? ?If you have labs (blood work) drawn today and your tests are completely normal, you will receive your results only by: ?MyChart Message (if you have MyChart) OR ?A paper copy in the mail ?If you have any lab test that is abnormal or we need to change your treatment, we will call you to review the results. ? ? ?Testing/Procedures: ?No Testing ? ? ?Follow-Up: ?At [facility], you and your health needs are our priority.  As part of our continuing mission to provide you with exceptional heart care, we have created designated Provider Care Teams.  These Care Teams include your primary Cardiologist (physician) and Advanced Practice Providers (APPs -  Physician Assistants and Nurse Practitioners) who all work together to provide you with the care you need, when you need it. ? ?We recommend signing up for the patient portal called "MyChart".  Sign up information is provided on this After Visit Summary.  MyChart is used to connect with patients for Virtual Visits (Telemedicine).  Patients are able to view lab/test results, encounter notes, upcoming appointments, etc.  Non-urgent messages can be sent to your provider as well.   ?To learn more about what you can do with MyChart, go to [email or web address].   ? ?Your next appointment:   ?6 month(s) ? ?The format for your next appointment:   ?In Person ? ?Provider:   ?Shelva Majestic, MD   ? ? ? ? ?Important Information About Sugar ? ? ? ? ?  ?

## 2021-08-28 DIAGNOSIS — I2510 Atherosclerotic heart disease of native coronary artery without angina pectoris: Secondary | ICD-10-CM | POA: Diagnosis not present

## 2021-08-28 DIAGNOSIS — I10 Essential (primary) hypertension: Secondary | ICD-10-CM | POA: Diagnosis not present

## 2021-08-28 DIAGNOSIS — Z9861 Coronary angioplasty status: Secondary | ICD-10-CM | POA: Diagnosis not present

## 2021-08-28 DIAGNOSIS — E785 Hyperlipidemia, unspecified: Secondary | ICD-10-CM | POA: Diagnosis not present

## 2021-08-28 LAB — LIPID PANEL
Chol/HDL Ratio: 2.6 ratio (ref 0.0–5.0)
Cholesterol, Total: 124 mg/dL (ref 100–199)
HDL: 47 mg/dL (ref >39)
LDL Chol Calc (NIH): 57 mg/dL (ref 0–99)
Triglycerides: 107 mg/dL (ref 0–149)
VLDL Cholesterol Cal: 20 mg/dL (ref 5–40)

## 2021-09-02 ENCOUNTER — Telehealth: Payer: Self-pay

## 2021-09-02 NOTE — Telephone Encounter (Addendum)
Called patient regarding results. Patient had understanding of results.----- Message from Warren Lacy, PA-C sent at 08/31/2021  2:07 PM EDT ----- ?Cholesterol at goal.  Triglycerides have greatly improved from last check a year ago.  LDL at goal (less than 70).  Continue current medications. ?

## 2021-09-03 ENCOUNTER — Encounter: Payer: Self-pay | Admitting: Internal Medicine

## 2021-09-03 ENCOUNTER — Ambulatory Visit: Payer: Medicare Other | Admitting: Internal Medicine

## 2021-09-03 DIAGNOSIS — R0902 Hypoxemia: Secondary | ICD-10-CM

## 2021-09-03 DIAGNOSIS — J449 Chronic obstructive pulmonary disease, unspecified: Secondary | ICD-10-CM | POA: Diagnosis not present

## 2021-09-03 MED ORDER — BREZTRI AEROSPHERE 160-9-4.8 MCG/ACT IN AERO: 2.0000 | INHALATION_SPRAY | Freq: Two times a day (BID) | RESPIRATORY_TRACT | 0 refills | Status: DC

## 2021-09-03 NOTE — Assessment & Plan Note (Signed)
MM/quit smoking in 2010  ?- HFA 75% p coaching 09/01/13 > started on symbicort 160 2 bid  ?- Spirometry 09/15/13  FEV1 1.67 (49%) ratio 44 % ?- 05/09/2020   breztri 2bid ?-  05/09/2020   Walked RA  2 laps @ approx 229f each @ avg pace  stopped due sob / sats 93%  ?- alpha one AT phenotype  MM,  Level 182  ?- Allergy profile 05/09/20 >  Eos 0.6/  IgE  162  ?- PFT's  07/01/2020  FEV1 1.35 (46 % ) ratio 0.48 p 6 % improvement from saba p breztri x 2 prior to study with DLCO  9.40 (40%) corrects to 1.97 (45%)  for alv volume and FV curve classic concavity  ?- 09/03/2021  After extensive coaching inhaler device,  effectiveness =    90% HFa    ? ? Group D (now reclassified as E) in terms of symptom/risk and laba/lama/ICS  therefore appropriate rx at this point >>>  Continue breztri and use saba more approp ? ?Re SABA :  I spent extra time with pt today reviewing appropriate use of albuterol for prn use on exertion with the following points: ?1) saba is for relief of sob that does not improve by walking a slower pace or resting but rather if the pt does not improve after trying this first. ?2) If the pt is convinced, as many are, that saba helps recover from activity faster then it's easy to tell if this is the case by re-challenging : ie stop, take the inhaler, then p 5 minutes try the exact same activity (intensity of workload) that just caused the symptoms and see if they are substantially diminished or not after saba ?3) if there is an activity that reproducibly causes the symptoms, try the saba 15 min before the activity on alternate days  ? ?If in fact the saba really does help, then fine to continue to use it prn but advised may need to look closer at the maintenance regimen being used to achieve better control of airways disease with exertion.  ? ?  ?

## 2021-09-03 NOTE — Assessment & Plan Note (Signed)
Reported 07/01/2020  ?- 12/18/2020   patient walked at a slow pace x 3 laps @ 250 ft each  and oxygen dropped to 87 % during 2lap and oxygen was placed and patient come back up to 93% on 2 liters> ordered . ? Best fit eval for amb 02 done 08/06/21  And did ok on 3lpm pulsed at >90% but rec  4 lpm pulse with higher levels of ex ? ?Again advised: ?Make sure you check your oxygen saturation  AT  your highest level of activity (not after you stop)   to be sure it stays over 90% and adjust  02 flow upward to maintain this level if needed but remember to turn it back to previous settings when you stop (to conserve your supply).  ? ?Also consider pulmonary rehab > declined ? ? ?F/u q 6 m - call sooner prn  ? ? ?    ?  ? ?Each maintenance medication was reviewed in detail including emphasizing most importantly the difference between maintenance and prns and under what circumstances the prns are to be triggered using an action plan format where appropriate. ? ?Total time for H and P, chart review, counseling, reviewing hfa/neb/02 device(s) and generating customized AVS unique to this office visit / same day charting  > 30 min  ?     ?

## 2021-09-03 NOTE — Patient Instructions (Addendum)
We will see about you changing companies when your 5 years is up ? ?Make sure you check your oxygen saturation  AT  your highest level of activity (NOT after you stop)   to be sure it stays over 90% and adjust  02 flow upward to maintain this level if needed but remember to turn it back to previous settings when you stop (to conserve your supply).  ? ? ?Ok to try albuterol 15 min before an activity (on alternating days - like starter fluid)  that you know would usually make you short of breath and see if it makes any difference and if makes none then don't take albuterol after activity unless you can't catch your breath as this means it's the resting that helps, not the albuterol. ? ? ?Please schedule a follow up visit in 6  months but call sooner if needed  ? ? ?    ? ? ? ?

## 2021-09-03 NOTE — Progress Notes (Signed)
? ?Timothy [name], male    DOB: 09-07-1955,  MRN: [id number] ? ? ?Brief patient profile:   ?10 yowm MM/quit smoking 2010 with GOLD III criteria 2015 maint on symbicort 160 2bid and losing ground with ex tol so referred to pulmonary clinic 05/09/2020 by Dr   Wynetta Emery  ? ?Retired [employer]  ? ? ?History of Present Illness  ?05/09/2020  Pulmonary/ 1st office eval/Timothy [name] symb maint / insurance would not pay spiriva ?Chief Complaint  ?Patient presents with  ? Pulmonary Consult   ?  Referred by Dr Karle Plumber. Pt seen here last in 2015 for COPD. He c/o increased SOB over the past 5 years. He states he gets SOB walking up hill and "not too far".  He is using his albuterol inhaler at 4-6 x per wk on average.   ? Dyspnea:  Grocery shopping limit is [employer] - can't do [employer] anymore, mailbox is 50 ft and no hill steps at slower pace = MMRC3 = can't walk 100 yards even at a slow pace at a flat grade s stopping due to sob   ?Cough: am congestion/ white  ?Sleep: on side two pillows flat bed  ?SABA use: saba hfa  ?rec ?Plan A = Automatic = Always=    Stop symbicort and start Breztri Take 2 puffs first thing in am and then another 2 puffs about 12 hours later.  ?Work on inhaler technique:  ?Plan B = Backup (to supplement plan A, not to replace it) ?Only use your albuterol inhaler as a rescue medication ?Plan C = Crisis (instead of Plan B but only if Plan B stops working) ?- only use your albuterol nebulizer if you first try Plan B and it fails to help > ok to use the nebulizer up to every 4 hours but if start needing it regularly call for immediate appointment  ? ? ? ?12/18/2020  f/u ov/Timothy [name] re: GOLD III maint on breztri/ desats with 02  ?Chief Complaint  ?Patient presents with  ? Follow-up  ?  Patient reports doing well and no concerns. ?  ?Dyspnea:  struggles to go down to feed dogs and back to the house  ?Cough: occ in ams/ white mucus ?Sleeping: ok on side bed is flat ?SABA use: once every 2 -3 days  ?02: none  ?Covid  status:   vax x 3  ?Rec ?Try albuterol 15 min before an activity (on alternating days)  that you know would make you short of breath  ?We will see if you qualify for 02 today  > goal is to keep above 90% walking ? ?- 12/18/2020   patient walked at a slow pace x 3 laps @ 250 ft each  and oxygen dropped to 87 % during 2lap and oxygen was placed and patient come back up to 93% on 2 liters> ordered  ? ? ?06/30/2021  f/u ov/Timothy [name] re: GOLD 3/  maint on breztri  not using amb 02 "too heavy"  ?Chief Complaint  ?Patient presents with  ? Follow-up  ?  Breathing is unchanged since the last visit. He has occ cough with white sputum. He is using his albuterol inhaler 3-4 x per wk.   ?Dyspnea:  less active now / not wearing 02 as rec with ex  ?Cough: some in am only  ?Sleeping: flat bed one pillow ?SABA use: as above/ rarely uses before ex ?02: prn 2lpm  ?Rec ?We will be referring you for best fit for ambulatory 02 - bring with  you to office  ? ? ?08/06/21   >>>  Best fit eval for amb 02 done - did ok on 3lpm pulsed at >90% but rec  4 lpm pulse with higher levels of ex  ? ? ?09/03/2021  f/u ov/Timothy [name] re: COPD GOLD 3  maint on breztri 2bid   ?Chief Complaint  ?Patient presents with  ? Follow-up  ?  Breathing is unchanged. He did not bring his o2 today and says he uses this at home sometimes if needed. He is using his albuterol inhaler about 3 x per wk.   ?Dyspnea:  does better slow pace, refusing amb 02 to datte ?Mb is 50 ft flat / greenhouse is slt downhill an always out of breath on way back, does not prechallenge with saba as rec  ?Cough: none  ?Sleeping: flat one pillow  ?SABA use: avg  once a day  ?02:  not really wearing at hs  ?Covid status:   vax x 3  ? ? ?No obvious day to day or daytime variability or assoc excess/ purulent sputum or mucus plugs or hemoptysis or cp or chest tightness, subjective wheeze or overt sinus or hb symptoms.  ? ?Sleeping  without nocturnal  or early am exacerbation  of respiratory  c/o's or need for noct  saba. Also denies any obvious fluctuation of symptoms with weather or environmental changes or other aggravating or alleviating factors except as outlined above  ? ?No unusual exposure hx or h/o childhood pna/ asthma or knowledge of premature birth. ? ?Current Allergies, Complete Past Medical History, Past Surgical History, Family History, and Social History were reviewed in [employer] record. ? ?ROS  The following are not active complaints unless bolded ?Hoarseness, sore throat, dysphagia, dental problems, itching, sneezing,  nasal congestion or discharge of excess mucus or purulent secretions, ear ache,   fever, chills, sweats, unintended wt loss or wt gain, classically pleuritic or exertional cp,  orthopnea pnd or arm/hand swelling  or leg swelling, presyncope, palpitations, abdominal pain, anorexia, nausea, vomiting, diarrhea  or change in bowel habits or change in bladder habits, change in stools or change in urine, dysuria, hematuria,  rash, arthralgias, visual complaints, headache, numbness, weakness or ataxia or problems with walking or coordination,  change in mood or  memory. ?      ? ?Current Meds  ?Medication Sig  ? acetaminophen (TYLENOL) 500 MG tablet Take 500-1,000 mg by mouth every 8 (eight) hours as needed for mild pain or headache.   ? albuterol (VENTOLIN HFA) 108 (90 Base) MCG/ACT inhaler Inhale 2 puffs into the lungs every 6 (six) hours as needed for wheezing or shortness of breath.  ? aspirin EC 81 MG tablet Take 1 tablet (81 mg total) by mouth daily. (Patient taking differently: Take 81 mg by mouth 2 (two) times a week.)  ? atorvastatin (LIPITOR) 80 MG tablet Take 1 tablet (80 mg total) by mouth at bedtime.  ? bisoprolol (ZEBETA) 5 MG tablet TAKE 1/2 TABLET(2.5 MG) BY MOUTH AT BEDTIME  ? BRILINTA 90 MG TABS tablet TAKE 1 TABLET(90 MG) BY MOUTH TWICE DAILY  ? Budeson-Glycopyrrol-Formoterol (BREZTRI AEROSPHERE) 160-9-4.8 MCG/ACT AERO Inhale 2 puffs into the lungs 2 (two)  times daily.  ? famotidine (PEPCID) 20 MG tablet Take 20 mg by mouth daily as needed for heartburn or indigestion.  ? metFORMIN (GLUCOPHAGE) 500 MG tablet Take 0.5 tablets (250 mg total) by mouth daily with breakfast.  ? nitroGLYCERIN (NITROSTAT) 0.4 MG SL tablet Place 1  tablet (0.4 mg total) under the tongue every 5 (five) minutes as needed for chest pain. NEED OV.  ?    ?  ? ?  ? ?  ? ?  ?  ?  ? ? ?Past Medical History:  ?Diagnosis Date  ? Altered mental status, improved at discharge 06/19/2013  ? Bronchitis, chronic ([place]) 06/19/2013  ? CAD (coronary artery disease)   ? a. s/p cardiac arrest in 06/2013 with DES to RCA b. 02/2020: Inferior STEMI and cath showing total occlusion of mid-RCA treated with DES placement.   ? Cancer ([place])   ? bladder  ? Cardiac arrest, hypothermic protocol 06/12/2013  ? COPD exacerbation ([place]) 06/12/2013  ? GERD (gastroesophageal reflux disease)   ? Hyperlipidemia LDL goal < 70 06/19/2013  ? Hypokalemia, replaced 06/19/2013  ? Hypomagnesemia, replaced 06/19/2013  ? Hypotension, requiring levophed 06/19/2013  ? NSTEMI (non-ST elevated myocardial infarction), DES to RCA 06/13/2013  ? Pre-diabetes   ? Tobacco abuse 06/19/2013  ? ?  ? ? ? ?Objective:  ? ?Wts ? ?09/03/2021          125   ?06/30/2021       125  ?12/18/2020       126  ?07/01/2020       132   ?06/20/20 133 lb (60.3 kg)  ?05/09/20 131 lb (59.4 kg)  ?03/07/20 138 lb (62.6 kg)  ?  ?Vital signs reviewed  09/03/2021  - Note at rest 02 sats  93% on RA   ? ?General appearance:    somber wm nad  / mild smoker's rattle  ? ?HEENT : poor dentition > advised to see dentist  ? ? ?NECK :  without JVD/Nodes/TM/ nl carotid upstrokes bilaterally ? ? ?LUNGS: no acc muscle use,  Mod barrel  contour chest wall with bilateral  Distant bs s audible wheeze and  without cough on insp or exp maneuvers and mod  Hyperresonant  to  percussion bilaterally   ? ? ?CV:  RRR  no s3 or murmur or increase in P2, and no edema  ? ?ABD:  soft and nontender with pos mid insp Hoover's   in the supine position. No bruits or organomegaly appreciated, bowel sounds nl ? ?MS:     ext warm without deformities, calf tenderness, cyanosis or clubbing ?No obvious joint restrictions  ? ?SKIN: warm an

## 2021-09-24 ENCOUNTER — Other Ambulatory Visit: Payer: Self-pay | Admitting: Pharmacist

## 2021-09-24 ENCOUNTER — Other Ambulatory Visit: Payer: Self-pay | Admitting: Internal Medicine

## 2021-09-24 DIAGNOSIS — R7303 Prediabetes: Secondary | ICD-10-CM

## 2021-09-24 MED ORDER — METFORMIN HCL 500 MG PO TABS: 250.0000 mg | ORAL_TABLET | Freq: Every day | ORAL | 0 refills | Status: DC

## 2021-09-26 ENCOUNTER — Other Ambulatory Visit: Payer: Self-pay | Admitting: Internal Medicine

## 2021-09-26 DIAGNOSIS — R7303 Prediabetes: Secondary | ICD-10-CM

## 2021-11-07 ENCOUNTER — Telehealth: Payer: Self-pay | Admitting: Internal Medicine

## 2021-11-07 MED ORDER — BREZTRI AEROSPHERE 160-9-4.8 MCG/ACT IN AERO: 2.0000 | INHALATION_SPRAY | Freq: Two times a day (BID) | RESPIRATORY_TRACT | 11 refills | Status: DC

## 2021-11-07 NOTE — Telephone Encounter (Signed)
Patient states the pharmacy denied his [facility] prescription and was told to call our office. Uses [facility] on [address]. Please advise. Call back [phone].

## 2021-11-07 NOTE — Telephone Encounter (Signed)
Spoke with the pt  He is out of refills on breztri and I have sent this to pharm  Nothing further needed

## 2021-12-18 ENCOUNTER — Other Ambulatory Visit: Payer: Self-pay | Admitting: *Deleted

## 2021-12-18 NOTE — Patient Outreach (Signed)
  Care Coordination   12/18/2021 Name: EDINSON DOMEIER MRN: [id number] DOB: 1956/04/13   Care Coordination Outreach Attempts:  An unsuccessful telephone outreach was attempted today to offer the patient information about available care coordination services as a benefit of their health plan.   Follow Up Plan:  Additional outreach attempts will be made to offer the patient care coordination information and services.   Encounter Outcome:  No Answer  Care Coordination Interventions Activated:  No   Care Coordination Interventions:  No, not indicated    SIG Eligah Anello C. Myrtie Neither, MSN, [facility] Gerontological Nurse Practitioner [facility] Care Management [phone]

## 2022-01-07 ENCOUNTER — Telehealth: Payer: Self-pay | Admitting: *Deleted

## 2022-01-07 NOTE — Patient Outreach (Signed)
  Care Coordination   01/07/2022 Name: Timothy [name] MRN: [id number] DOB: 1955/05/05   Care Coordination Outreach Attempts:  A second unsuccessful outreach was attempted today to offer the patient with information about available care coordination services as a benefit of their health plan.     Follow Up Plan:  Additional outreach attempts will be made to offer the patient care coordination information and services.   Encounter Outcome:  No Answer  Care Coordination Interventions Activated:  No   Care Coordination Interventions:  No, not indicated    SIG Colie Josten C. Myrtie Neither, MSN, [facility] Gerontological Nurse Practitioner [facility] Care Management [phone]

## 2022-01-09 ENCOUNTER — Telehealth: Payer: Self-pay | Admitting: *Deleted

## 2022-01-09 NOTE — Patient Outreach (Signed)
  Care Coordination   01/09/2022 Name: Timothy [name] MRN: [id number] DOB: [birth year]   Care Coordination Outreach Attempts:  A third unsuccessful outreach was attempted today to offer the patient with information about available care coordination services as a benefit of their health plan.   Follow Up Plan:  No further outreach attempts will be made at this time. We have been unable to contact the patient to offer or enroll patient in care coordination services  Encounter Outcome:  No Answer  Care Coordination Interventions Activated:  No   Care Coordination Interventions:  No, not indicated    Jacqlyn Larsen [facility], BSN [facility] RN Care Coordinator [phone]

## 2022-03-03 ENCOUNTER — Ambulatory Visit: Payer: Medicare Other | Admitting: Internal Medicine

## 2022-03-03 ENCOUNTER — Encounter: Payer: Self-pay | Admitting: Internal Medicine

## 2022-03-03 DIAGNOSIS — Z87891 Personal history of nicotine dependence: Secondary | ICD-10-CM

## 2022-03-03 DIAGNOSIS — J449 Chronic obstructive pulmonary disease, unspecified: Secondary | ICD-10-CM | POA: Diagnosis not present

## 2022-03-03 NOTE — Patient Instructions (Addendum)
No change in medications   See your dentist regularly   My office will be contacting you by phone for referral to lung cancer screening program  - if you don't hear back from my office within one week please call us back or notify us thru MyChart and we'll address it right away.    Please schedule a follow up visit in 12 months but call sooner if needed

## 2022-03-03 NOTE — Progress Notes (Unsigned)
Timothy [name], male    DOB: 10/17/1955,  MRN: [id number]   Brief patient profile:   66yowm MM/quit smoking 2010 with GOLD III criteria 2015 maint on symbicort 160 2bid and losing ground with ex tol so referred to pulmonary clinic 05/09/2020 by Dr   Wynetta Emery   Retired [employer]    History of Present Illness  05/09/2020  Pulmonary/ 1st office eval/Trevionne Advani symb maint / insurance would not pay spiriva Chief Complaint  Patient presents with   Pulmonary Consult     Referred by Dr Karle Plumber. Pt seen here last in 2015 for COPD. He c/o increased SOB over the past 5 years. He states he gets SOB walking up hill and "not too far".  He is using his albuterol inhaler at 4-6 x per wk on average.    Dyspnea:  Grocery shopping limit is [employer] - can't do [employer] anymore, mailbox is 50 ft and no hill steps at slower pace = MMRC3 = can't walk 100 yards even at a slow pace at a flat grade s stopping due to sob   Cough: am congestion/ white  Sleep: on side two pillows flat bed  SABA use: saba hfa  rec Plan A = Automatic = Always=    Stop symbicort and start Breztri Take 2 puffs first thing in am and then another 2 puffs about 12 hours later.  Work on inhaler technique:  Plan B = Backup (to supplement plan A, not to replace it) Only use your albuterol inhaler as a rescue medication Plan C = Crisis (instead of Plan B but only if Plan B stops working) - only use your albuterol nebulizer if you first try Plan B and it fails to help > ok to use the nebulizer up to every 4 hours but if start needing it regularly call for immediate appointment     12/18/2020  f/u ov/Hesper Venturella re: GOLD III maint on breztri/ desats with 02  Chief Complaint  Patient presents with   Follow-up    Patient reports doing well and no concerns.   Dyspnea:  struggles to go down to feed dogs and back to the house  Cough: occ in ams/ white mucus Sleeping: ok on side bed is flat SABA use: once every 2 -3 days  02: none  Covid  status:   vax x 3  Rec Try albuterol 15 min before an activity (on alternating days)  that you know would make you short of breath  We will see if you qualify for 02 today  > goal is to keep above 90% walking  - 12/18/2020   patient walked at a slow pace x 3 laps @ 250 ft each  and oxygen dropped to 87 % during 2lap and oxygen was placed and patient come back up to 93% on 2 liters> ordered    06/30/2021  f/u ov/Leilyn Frayre re: GOLD 3/  maint on breztri  not using amb 02 "too heavy"  Chief Complaint  Patient presents with   Follow-up    Breathing is unchanged since the last visit. He has occ cough with white sputum. He is using his albuterol inhaler 3-4 x per wk.   Dyspnea:  less active now / not wearing 02 as rec with ex  Cough: some in am only  Sleeping: flat bed one pillow SABA use: as above/ rarely uses before ex 02: prn 2lpm  Rec We will be referring you for best fit for ambulatory 02 - bring with you  to office    08/06/21   >>>  Best fit eval for amb 02 done - did ok on 3lpm pulsed at >90% but rec  4 lpm pulse with higher levels of ex    09/03/2021  f/u ov/Anavey Coombes re: COPD GOLD 3  maint on breztri 2bid   Chief Complaint  Patient presents with   Follow-up    Breathing is unchanged. He did not bring his o2 today and says he uses this at home sometimes if needed. He is using his albuterol inhaler about 3 x per wk.   Dyspnea:  does better slow pace, refusing amb 02 to date Mb is 50 ft flat / greenhouse is slt downhill an always out of breath on way back, does not prechallenge with saba as rec  Cough: none  Sleeping: flat one pillow  SABA use: avg  once a day  02:  not really wearing at hs  Covid status:   vax x 3  Rec We will see about you changing companies when your 5 years is up Make sure you check your oxygen saturation  AT  your highest level of activity (NOT after you stop)   to be sure it stays over 90%  Ok to try albuterol 15 min before an activity (on alternating days - like starter  fluid)  that you know would usually make you short of breath Please schedule a follow up visit in 6  months but call sooner if needed     03/03/2022  f/u ov/[name] re: GOLD 3   maint on breztri   Chief Complaint  Patient presents with   Follow-up    COPD. No sx noted per pt   Dyspnea:  seems to help to use saba before activity  Cough: none  Sleeping: twice daily  SABA use: twice daily  02: prn   Lung cancer screening :  referred today     No obvious day to day or daytime variability or assoc excess/ purulent sputum or mucus plugs or hemoptysis or cp or chest tightness, subjective wheeze or overt sinus or hb symptoms.   Sleeping  without nocturnal  or early am exacerbation  of respiratory  c/o's or need for noct saba. Also denies any obvious fluctuation of symptoms with weather or environmental changes or other aggravating or alleviating factors except as outlined above   No unusual exposure hx or h/o childhood pna/ asthma or knowledge of premature birth.  Current Allergies, Complete Past Medical History, Past Surgical History, Family History, and Social History were reviewed in [employer] record.  ROS  The following are not active complaints unless bolded Hoarseness, sore throat, dysphagia, dental problems, itching, sneezing,  nasal congestion or discharge of excess mucus or purulent secretions, ear ache,   fever, chills, sweats, unintended wt loss or wt gain, classically pleuritic or exertional cp,  orthopnea pnd or arm/hand swelling  or leg swelling, presyncope, palpitations, abdominal pain, anorexia, nausea, vomiting, diarrhea  or change in bowel habits or change in bladder habits, change in stools or change in urine, dysuria, hematuria,  rash, arthralgias, visual complaints, headache, numbness, weakness or ataxia or problems with walking or coordination,  change in mood or  memory.        No outpatient medications have been marked as taking for the 03/03/22  encounter (Office Visit) with Tanda Rockers, MD.  Past Medical History:  Diagnosis Date   Altered mental status, improved at discharge 06/19/2013   Bronchitis, chronic ([place]) 06/19/2013   CAD (coronary artery disease)    a. s/p cardiac arrest in 06/2013 with DES to RCA b. 02/2020: Inferior STEMI and cath showing total occlusion of mid-RCA treated with DES placement.    Cancer [facility])    bladder   Cardiac arrest, hypothermic protocol 06/12/2013   COPD exacerbation ([place]) 06/12/2013   GERD (gastroesophageal reflux disease)    Hyperlipidemia LDL goal < 70 06/19/2013   Hypokalemia, replaced 06/19/2013   Hypomagnesemia, replaced 06/19/2013   Hypotension, requiring levophed 06/19/2013   NSTEMI (non-ST elevated myocardial infarction), DES to RCA 06/13/2013   Pre-diabetes    Tobacco abuse 06/19/2013        Objective:   Wts  03/03/2022     126  09/03/2021         125   06/30/2021       125  12/18/2020       126  07/01/2020       132   06/20/20 133 lb (60.3 kg)  05/09/20 131 lb (59.4 kg)  03/07/20 138 lb (62.6 kg)      Vital signs reviewed  03/03/2022  - Note at rest 02 sats  93% on RA   General appearance:    amb wm nad   HEENT :  Oropharynx  clear/ dentition poor   Nasal turbinates nl    NECK :  without JVD/Nodes/TM/ nl carotid upstrokes bilaterally   LUNGS: no acc muscle use,  Mod barrel  contour chest wall with bilateral  Distant bs s audible wheeze and  without cough on insp or exp maneuvers and mod  Hyperresonant  to  percussion bilaterally     CV:  RRR  no s3 or murmur or increase in P2, and no edema   ABD:  soft and nontender with pos mid insp Hoover's  in the supine position. No bruits or organomegaly appreciated, bowel sounds nl  MS:   Ext warm without deformities or   obvious joint restrictions , calf tenderness, cyanosis or clubbing  SKIN: warm and dry without lesions    NEURO:  alert, approp, nl sensorium with  no motor or cerebellar  deficits apparent.                               Assessment     Outpatient Encounter Medications as of 03/03/2022  Medication Sig   acetaminophen (TYLENOL) 500 MG tablet Take 500-1,000 mg by mouth every 8 (eight) hours as needed for mild pain or headache.    albuterol (VENTOLIN HFA) 108 (90 Base) MCG/ACT inhaler Inhale 2 puffs into the lungs every 6 (six) hours as needed for wheezing or shortness of breath.   aspirin EC 81 MG tablet Take 1 tablet (81 mg total) by mouth daily. (Patient taking differently: Take 81 mg by mouth 2 (two) times a week.)   atorvastatin (LIPITOR) 80 MG tablet Take 1 tablet (80 mg total) by mouth at bedtime.   bisoprolol (ZEBETA) 5 MG tablet TAKE 1/2 TABLET(2.5 MG) BY MOUTH AT BEDTIME   BRILINTA 90 MG TABS tablet TAKE 1 TABLET(90 MG) BY MOUTH TWICE DAILY   Budeson-Glycopyrrol-Formoterol (BREZTRI AEROSPHERE) 160-9-4.8 MCG/ACT AERO Inhale 2 puffs into the lungs 2 (two) times daily.   Budeson-Glycopyrrol-Formoterol (BREZTRI AEROSPHERE) 160-9-4.8 MCG/ACT AERO Inhale 2 puffs into the lungs in the morning and at  bedtime.   famotidine (PEPCID) 20 MG tablet Take 20 mg by mouth daily as needed for heartburn or indigestion.   metFORMIN (GLUCOPHAGE) 500 MG tablet Take 0.5 tablets (250 mg total) by mouth daily with breakfast.   nitroGLYCERIN (NITROSTAT) 0.4 MG SL tablet Place 1 tablet (0.4 mg total) under the tongue every 5 (five) minutes as needed for chest pain. NEED OV.

## 2022-03-04 ENCOUNTER — Encounter: Payer: Self-pay | Admitting: Internal Medicine

## 2022-03-04 NOTE — Assessment & Plan Note (Signed)
MM/quit smoking in 2010  - HFA 75% p coaching 09/01/13 > started on symbicort 160 2 bid  - Spirometry 09/15/13  FEV1 1.67 (49%) ratio 44 % - 05/09/2020   breztri 2bid -  05/09/2020   Walked RA  2 laps @ approx 24f each @ avg pace  stopped due sob / sats 93%  - alpha one AT phenotype  MM,  Level 182  - Allergy profile 05/09/20 >  Eos 0.6/  IgE  162  - PFT's  07/01/2020  FEV1 1.35 (46 % ) ratio 0.48 p 6 % improvement from saba p breztri x 2 prior to study with DLCO  9.40 (40%) corrects to 1.97 (45%)  for alv volume and FV curve classic concavity  - 09/03/2021  After extensive coaching inhaler device,  effectiveness =    90% HFa      Group D (now reclassified as E) in terms of symptom/risk and laba/lama/ICS  therefore appropriate rx at this point >>>  Continue breztri 2bid and approp saba

## 2022-03-04 NOTE — Assessment & Plan Note (Signed)
Low-dose CT lung cancer screening is recommended for patients who are 67-65 years of age with a 20+ pack-year history of smoking and who are currently smoking or quit <=15 years ago. No coughing up blood  No unintentional weight loss of > 15 pounds in the last 6 months - pt is eligible for scanning yearly until 2025 > referred  F/u yearly / sooner prn         Each maintenance medication was reviewed in detail including emphasizing most importantly the difference between maintenance and prns and under what circumstances the prns are to be triggered using an action plan format where appropriate.  Total time for H and P, chart review, counseling, reviewing hfa  device(s) and generating customized AVS unique to this office visit / same day charting = 25 min

## 2022-04-30 ENCOUNTER — Other Ambulatory Visit: Payer: Self-pay | Admitting: Internal Medicine

## 2022-04-30 DIAGNOSIS — I2510 Atherosclerotic heart disease of native coronary artery without angina pectoris: Secondary | ICD-10-CM

## 2022-05-01 ENCOUNTER — Other Ambulatory Visit: Payer: Self-pay

## 2022-05-01 DIAGNOSIS — Z87891 Personal history of nicotine dependence: Secondary | ICD-10-CM

## 2022-05-01 DIAGNOSIS — Z122 Encounter for screening for malignant neoplasm of respiratory organs: Secondary | ICD-10-CM

## 2022-05-28 ENCOUNTER — Ambulatory Visit: Payer: Medicare Other | Attending: Internal Medicine

## 2022-05-28 VITALS — Ht 66.0 in | Wt 125.0 lb

## 2022-05-28 DIAGNOSIS — Z0000 Encounter for general adult medical examination without abnormal findings: Secondary | ICD-10-CM

## 2022-05-28 NOTE — Patient Instructions (Signed)
Mr. Bartelt , Thank you for taking time to come for your Medicare Wellness Visit. I appreciate your ongoing commitment to your health goals. Please review the following plan we discussed and let me know if I can assist you in the future.   These are the goals we discussed:  Goals      Patient Stated     05/28/2022, stay alive        This is a list of the screening recommended for you and due dates:  Health Maintenance  Topic Date Due   Hepatitis C Screening: USPSTF Recommendation to screen - Ages 70-79 yo.  Never done   Zoster (Shingles) Vaccine (1 of 2) Never done   Colon Cancer Screening  Never done   COVID-19 Vaccine (4 - 2023-24 season) 01/02/2022   Screening for Lung Cancer  04/02/2022   Medicare Annual Wellness Visit  05/29/2023   DTaP/Tdap/Td vaccine (2 - Td or Tdap) 02/24/2027   Pneumonia Vaccine  Completed   Flu Shot  Completed   HIV Screening  Completed   HPV Vaccine  Aged Out    Advanced directives: Advance directive discussed with you today.   Conditions/risks identified: none  Next appointment: Follow up in one year for your annual wellness visit.   Preventive Care 70 Years and Older, Male  Preventive care refers to lifestyle choices and visits with your health care provider that can promote health and wellness. What does preventive care include? A yearly physical exam. This is also called an annual well check. Dental exams once or twice a year. Routine eye exams. Ask your health care provider how often you should have your eyes checked. Personal lifestyle choices, including: Daily care of your teeth and gums. Regular physical activity. Eating a healthy diet. Avoiding tobacco and drug use. Limiting alcohol use. Practicing safe sex. Taking low doses of aspirin every day. Taking vitamin and mineral supplements as recommended by your health care provider. What happens during an annual well check? The services and screenings done by your health care provider  during your annual well check will depend on your age, overall health, lifestyle risk factors, and family history of disease. Counseling  Your health care provider may ask you questions about your: Alcohol use. Tobacco use. Drug use. Emotional well-being. Home and relationship well-being. Sexual activity. Eating habits. History of falls. Memory and ability to understand (cognition). Work and work Statistician. Screening  You may have the following tests or measurements: Height, weight, and BMI. Blood pressure. Lipid and cholesterol levels. These may be checked every 5 years, or more frequently if you are over 6 years old. Skin check. Lung cancer screening. You may have this screening every year starting at age 42 if you have a 30-pack-year history of smoking and currently smoke or have quit within the past 15 years. Fecal occult blood test (FOBT) of the stool. You may have this test every year starting at age 75. Flexible sigmoidoscopy or colonoscopy. You may have a sigmoidoscopy every 5 years or a colonoscopy every 10 years starting at age 42. Prostate cancer screening. Recommendations will vary depending on your family history and other risks. Hepatitis C blood test. Hepatitis B blood test. Sexually transmitted disease (STD) testing. Diabetes screening. This is done by checking your blood sugar (glucose) after you have not eaten for a while (fasting). You may have this done every 1-3 years. Abdominal aortic aneurysm (AAA) screening. You may need this if you are a current or former smoker. Osteoporosis. You  may be screened starting at age 68 if you are at high risk. Talk with your health care provider about your test results, treatment options, and if necessary, the need for more tests. Vaccines  Your health care provider may recommend certain vaccines, such as: Influenza vaccine. This is recommended every year. Tetanus, diphtheria, and acellular pertussis (Tdap, Td) vaccine. You  may need a Td booster every 10 years. Zoster vaccine. You may need this after age 22. Pneumococcal 13-valent conjugate (PCV13) vaccine. One dose is recommended after age 64. Pneumococcal polysaccharide (PPSV23) vaccine. One dose is recommended after age 68. Talk to your health care provider about which screenings and vaccines you need and how often you need them. This information is not intended to replace advice given to you by your health care provider. Make sure you discuss any questions you have with your health care provider. Document Released: 05/17/2015 Document Revised: 01/08/2016 Document Reviewed: 02/19/2015 Elsevier Interactive Patient Education  2017 [place] Prevention in the Home Falls can cause injuries. They can happen to people of all ages. There are many things you can do to make your home safe and to help prevent falls. What can I do on the outside of my home? Regularly fix the edges of walkways and driveways and fix any cracks. Remove anything that might make you trip as you walk through a door, such as a raised step or threshold. Trim any bushes or trees on the path to your home. Use bright outdoor lighting. Clear any walking paths of anything that might make someone trip, such as rocks or tools. Regularly check to see if handrails are loose or broken. Make sure that both sides of any steps have handrails. Any raised decks and porches should have guardrails on the edges. Have any leaves, snow, or ice cleared regularly. Use sand or salt on walking paths during winter. Clean up any spills in your garage right away. This includes oil or grease spills. What can I do in the bathroom? Use night lights. Install grab bars by the toilet and in the tub and shower. Do not use towel bars as grab bars. Use non-skid mats or decals in the tub or shower. If you need to sit down in the shower, use a plastic, non-slip stool. Keep the floor dry. Clean up any water that spills  on the floor as soon as it happens. Remove soap buildup in the tub or shower regularly. Attach bath mats securely with double-sided non-slip rug tape. Do not have throw rugs and other things on the floor that can make you trip. What can I do in the bedroom? Use night lights. Make sure that you have a light by your bed that is easy to reach. Do not use any sheets or blankets that are too big for your bed. They should not hang down onto the floor. Have a firm chair that has side arms. You can use this for support while you get dressed. Do not have throw rugs and other things on the floor that can make you trip. What can I do in the kitchen? Clean up any spills right away. Avoid walking on wet floors. Keep items that you use a lot in easy-to-reach places. If you need to reach something above you, use a strong step stool that has a grab bar. Keep electrical cords out of the way. Do not use floor polish or wax that makes floors slippery. If you must use wax, use non-skid floor wax. Do  not have throw rugs and other things on the floor that can make you trip. What can I do with my stairs? Do not leave any items on the stairs. Make sure that there are handrails on both sides of the stairs and use them. Fix handrails that are broken or loose. Make sure that handrails are as long as the stairways. Check any carpeting to make sure that it is firmly attached to the stairs. Fix any carpet that is loose or worn. Avoid having throw rugs at the top or bottom of the stairs. If you do have throw rugs, attach them to the floor with carpet tape. Make sure that you have a light switch at the top of the stairs and the bottom of the stairs. If you do not have them, ask someone to add them for you. What else can I do to help prevent falls? Wear shoes that: Do not have high heels. Have rubber bottoms. Are comfortable and fit you well. Are closed at the toe. Do not wear sandals. If you use a stepladder: Make  sure that it is fully opened. Do not climb a closed stepladder. Make sure that both sides of the stepladder are locked into place. Ask someone to hold it for you, if possible. Clearly mark and make sure that you can see: Any grab bars or handrails. First and last steps. Where the edge of each step is. Use tools that help you move around (mobility aids) if they are needed. These include: Canes. Walkers. Scooters. Crutches. Turn on the lights when you go into a dark area. Replace any light bulbs as soon as they burn out. Set up your furniture so you have a clear path. Avoid moving your furniture around. If any of your floors are uneven, fix them. If there are any pets around you, be aware of where they are. Review your medicines with your doctor. Some medicines can make you feel dizzy. This can increase your chance of falling. Ask your doctor what other things that you can do to help prevent falls. This information is not intended to replace advice given to you by your health care provider. Make sure you discuss any questions you have with your health care provider. Document Released: 02/14/2009 Document Revised: 09/26/2015 Document Reviewed: 05/25/2014 Elsevier Interactive Patient Education  2017 [employer].

## 2022-05-28 NOTE — Progress Notes (Signed)
I connected with Timothy [name] today by telephone and verified that I am speaking with the correct person using two identifiers. Location patient: home Location provider: work Persons participating in the virtual visit: Hisashi Amadon, Glenna Durand LPN.   I discussed the limitations, risks, security and privacy concerns of performing an evaluation and management service by telephone and the availability of in person appointments. I also discussed with the patient that there may be a patient responsible charge related to this service. The patient expressed understanding and verbally consented to this telephonic visit.    Interactive audio and video telecommunications were attempted between this provider and patient, however failed, due to patient having technical difficulties OR patient did not have access to video capability.  We continued and completed visit with audio only.     Vital signs may be patient reported or missing.  Subjective:   Timothy [name] is a 67 y.o. male who presents for an Initial Medicare Annual Wellness Visit.  Review of Systems     Cardiac Risk Factors include: advanced age (>40mn, >>43women);dyslipidemia;male gender     Objective:    Today's Vitals   05/28/22 1228  Weight: 125 lb (56.7 kg)  Height: '5\' 6"'$  (1.676 m)   Body mass index is 20.18 kg/m.     05/28/2022   12:32 PM 04/03/2021    5:23 PM 02/24/2020    1:19 PM 04/23/2017    9:41 AM 04/22/2017    7:45 PM 03/31/2017   10:33 AM 03/30/2017   11:36 PM  Advanced Directives  Does Patient Have a Medical Advance Directive? No No No No No No No  Would patient like information on creating a medical advance directive?  No - Patient declined No - Patient declined No - Patient declined  No - Patient declined     Current Medications (verified) Outpatient Encounter Medications as of 05/28/2022  Medication Sig   acetaminophen (TYLENOL) 500 MG tablet Take 500-1,000 mg by mouth every 8 (eight) hours as needed  for mild pain or headache.    albuterol (VENTOLIN HFA) 108 (90 Base) MCG/ACT inhaler Inhale 2 puffs into the lungs every 6 (six) hours as needed for wheezing or shortness of breath.   aspirin EC 81 MG tablet Take 1 tablet (81 mg total) by mouth daily.   atorvastatin (LIPITOR) 80 MG tablet Take 1 tablet (80 mg total) by mouth at bedtime.   bisoprolol (ZEBETA) 5 MG tablet TAKE 1/2 TABLET(2.5 MG) BY MOUTH AT BEDTIME   BRILINTA 90 MG TABS tablet TAKE 1 TABLET(90 MG) BY MOUTH TWICE DAILY   Budeson-Glycopyrrol-Formoterol (BREZTRI AEROSPHERE) 160-9-4.8 MCG/ACT AERO Inhale 2 puffs into the lungs 2 (two) times daily.   Budeson-Glycopyrrol-Formoterol (BREZTRI AEROSPHERE) 160-9-4.8 MCG/ACT AERO Inhale 2 puffs into the lungs in the morning and at bedtime.   famotidine (PEPCID) 20 MG tablet Take 20 mg by mouth daily as needed for heartburn or indigestion.   nitroGLYCERIN (NITROSTAT) 0.4 MG SL tablet Place 1 tablet (0.4 mg total) under the tongue every 5 (five) minutes as needed for chest pain. NEED OV.   metFORMIN (GLUCOPHAGE) 500 MG tablet Take 0.5 tablets (250 mg total) by mouth daily with breakfast. (Patient not taking: Reported on 05/28/2022)   No facility-administered encounter medications on file as of 05/28/2022.    Allergies (verified) Ace inhibitors, Aspirin, Ibuprofen, and Losartan   History: Past Medical History:  Diagnosis Date   Altered mental status, improved at discharge 06/19/2013   [place]   CAD (  coronary artery disease)    a. s/p cardiac arrest in 06/2013 with DES to RCA b. 02/2020: Inferior STEMI and cath showing total occlusion of mid-RCA treated with DES placement.    Cancer [facility])    bladder   Cardiac arrest, hypothermic protocol 06/12/2013   COPD exacerbation ([place]) 06/12/2013   GERD (gastroesophageal reflux disease)    Hyperlipidemia LDL goal < 70 06/19/2013   Hypokalemia, replaced 06/19/2013   Hypomagnesemia, replaced 06/19/2013   Hypotension, requiring  levophed 06/19/2013   NSTEMI (non-ST elevated myocardial infarction), DES to RCA 06/13/2013   Pre-diabetes    Tobacco abuse 06/19/2013   Past Surgical History:  Procedure Laterality Date   cardiac sstent     CORONARY ANGIOPLASTY WITH STENT PLACEMENT  06/12/13   [place] DES to RCA, NSTEMI   CORONARY STENT INTERVENTION N/A 02/24/2020   Procedure: CORONARY STENT INTERVENTION;  Surgeon: Belva Crome, MD;  Location: [place] [facility];  Service: Cardiovascular;  Laterality: N/A;   CORONARY/GRAFT ACUTE MI REVASCULARIZATION N/A 02/24/2020   Procedure: Coronary/Graft Acute MI Revascularization;  Surgeon: Belva Crome, MD;  Location: [place] [facility];  Service: Cardiovascular;  Laterality: N/A;   CYSTOSCOPY W/ RETROGRADES Left 03/29/2017   Procedure: CYSTOSCOPY WITH LEFT RETROGRADE PYELOGRAM;  Surgeon: Kathie Rhodes, MD;  Location: [facility];  Service: Urology;  Laterality: Left;   LEFT HEART CATH AND CORONARY ANGIOGRAPHY N/A 02/24/2020   Procedure: LEFT HEART CATH AND CORONARY ANGIOGRAPHY;  Surgeon: Belva Crome, MD;  Location: [place] [facility];  Service: Cardiovascular;  Laterality: N/A;   LEFT HEART CATHETERIZATION WITH CORONARY ANGIOGRAM N/A 06/12/2013   Procedure: LEFT HEART CATHETERIZATION WITH CORONARY ANGIOGRAM;  Surgeon: Troy Sine, MD;  Location: [facility] C[facility] LAB;  Service: Cardiovascular;  Laterality: N/A;   NO PAST SURGERIES     TRANSURETHRAL RESECTION OF BLADDER TUMOR N/A 03/29/2017   Procedure: TRANSURETHRAL RESECTION OF BLADDER TUMOR (TURBT)/;  Surgeon: Kathie Rhodes, MD;  Location: [facility];  Service: Urology;  Laterality: N/A;   transurethral ressection of bladder tumor     Dr. Karsten Ro 03/29/17   Family History  Problem Relation Age of Onset   Hypertension Other    Social History   Socioeconomic History   Marital status: Divorced    Spouse name: Not on file   Number of children: Not on file   Years of education: Not on file   Highest education level: Not on file   Occupational History   Not on file  Tobacco Use   Smoking status: Former    Packs/day: 2.00    Years: 38.00    Total pack years: 76.00    Types: Cigarettes    Quit date: 07/10/2008    Years since quitting: 13.8   Smokeless tobacco: Never  Vaping Use   Vaping Use: Never used  Substance and Sexual Activity   Alcohol use: No   Drug use: No    Types: Marijuana   Sexual activity: Yes  Other Topics Concern   Not on file  Social History Narrative   Not on file   Social Determinants of Health   Financial Resource Strain: Low Risk  (05/28/2022)   Overall Financial Resource Strain (CARDIA)    Difficulty of Paying Living Expenses: Not hard at all  Food Insecurity: No Food Insecurity (05/28/2022)   Hunger Vital Sign    Worried About Running Out of Food in the Last Year: Never true    [place] in the Last Year: Never  true  Transportation Needs: No Transportation Needs (05/28/2022)   PRAPARE - Hydrologist (Medical): No    Lack of Transportation (Non-Medical): No  Physical Activity: Inactive (05/28/2022)   Exercise Vital Sign    Days of Exercise per Week: 0 days    Minutes of Exercise per Session: 0 min  Stress: No Stress Concern Present (05/28/2022)   [place]    Feeling of Stress : Not at all  Social Connections: Not on file    Tobacco Counseling Counseling given: Not Answered   Clinical Intake:  Pre-visit preparation completed: Yes  Pain : No/denies pain     Nutritional Status: BMI of 19-24  Normal Nutritional Risks: None Diabetes: No  How often do you need to have someone help you when you read instructions, pamphlets, or other written materials from your doctor or pharmacy?: 1 - Never  Diabetic? no  Interpreter Needed?: No  Information entered by :: NAllen LPN   Activities of Daily Living    05/28/2022   12:33 PM  In your present state of health, do you  have any difficulty performing the following activities:  Hearing? 0  Vision? 0  Difficulty concentrating or making decisions? 0  Walking or climbing stairs? 1  Dressing or bathing? 0  Doing errands, shopping? 0  Preparing Food and eating ? N  Using the Toilet? N  In the past six months, have you accidently leaked urine? N  Do you have problems with loss of bowel control? N  Managing your Medications? N  Managing your Finances? N  Housekeeping or managing your Housekeeping? N    Patient Care Team: Ladell Pier, MD as PCP - General (Internal Medicine) Troy Sine, MD as PCP - Cardiology (Cardiology)  Indicate any recent Medical Services you may have received from other than [facility] providers in the past year (date may be approximate).     Assessment:   This is a routine wellness examination for [facility].  Hearing/Vision screen Vision Screening - Comments:: No regular eye exams,  Dietary issues and exercise activities discussed: Current Exercise Habits: The patient does not participate in regular exercise at present   Goals Addressed             This Visit's Progress    Patient Stated       05/28/2022, stay alive       Depression Screen    05/28/2022   12:33 PM 04/21/2021   10:56 AM 03/05/2020   11:20 AM 05/27/2017    8:51 AM 02/23/2017    8:33 AM 11/20/2016    8:50 AM 10/21/2015   10:36 AM  PHQ 2/9 Scores  PHQ - 2 Score 0 0 0 0 0 1 0  PHQ- 9 Score  0     3    Fall Risk    05/28/2022   12:33 PM 04/21/2021   10:52 AM 03/05/2020   11:20 AM 05/27/2017    8:51 AM 10/21/2015   10:36 AM  Fall Risk   Falls in the past year? 0 0 0 No No  Number falls in past yr: 0  0    Injury with Fall? 0  0    Risk for fall due to : Medication side effect      Follow up Falls prevention discussed;Education provided;Falls evaluation completed        FALL RISK PREVENTION PERTAINING TO THE HOME:  Any stairs in or  around the home? Yes  If so, are there any without  handrails? No  Home free of loose throw rugs in walkways, pet beds, electrical cords, etc? Yes  Adequate lighting in your home to reduce risk of falls? Yes   ASSISTIVE DEVICES UTILIZED TO PREVENT FALLS:  Life alert? No  Use of a cane, walker or w/c? No  Grab bars in the bathroom? No  Shower chair or bench in shower? No  Elevated toilet seat or a handicapped toilet? Yes   TIMED UP AND GO:  Was the test performed? No .      Cognitive Function:        05/28/2022   12:34 PM  6CIT Screen  What Year? 0 points  What month? 0 points  What time? 0 points  Count back from 20 0 points  Months in reverse 0 points  Repeat phrase 0 points  Total Score 0 points    Immunizations Immunization History  Administered Date(s) Administered   Influenza,inj,Quad PF,6+ Mos 01/15/2014, 02/23/2017, 03/05/2020, 04/21/2021   Influenza-Unspecified 02/10/2022   PFIZER(Purple Top)SARS-COV-2 Vaccination 08/24/2019, 09/14/2019, 06/20/2020   Pneumococcal Conjugate (Pcv15) 04/21/2021   Pneumococcal Polysaccharide-23 06/15/2013   Tdap 02/23/2017    TDAP status: Up to date  Flu Vaccine status: Up to date  Pneumococcal vaccine status: Up to date  Covid-19 vaccine status: Completed vaccines  Qualifies for Shingles Vaccine? Yes   Zostavax completed No   Shingrix Completed?: No.    Education has been provided regarding the importance of this vaccine. Patient has been advised to call insurance company to determine out of pocket expense if they have not yet received this vaccine. Advised may also receive vaccine at local pharmacy or Health Dept. Verbalized acceptance and understanding.  Screening Tests Health Maintenance  Topic Date Due   Medicare Annual Wellness (AWV)  Never done   Hepatitis C Screening  Never done   Zoster Vaccines- Shingrix (1 of 2) Never done   COLONOSCOPY (Pts 45-58yr Insurance coverage will need to be confirmed)  Never done   COVID-19 Vaccine (4 - 2023-24 season)  01/02/2022   Lung Cancer Screening  04/02/2022   DTaP/Tdap/Td (2 - Td or Tdap) 02/24/2027   Pneumonia Vaccine 66 Years old  Completed   INFLUENZA VACCINE  Completed   HIV Screening  Completed   HPV VACCINES  Aged Out    Health Maintenance  Health Maintenance Due  Topic Date Due   Medicare Annual Wellness (AWV)  Never done   Hepatitis C Screening  Never done   Zoster Vaccines- Shingrix (1 of 2) Never done   COLONOSCOPY (Pts 45-469yrInsurance coverage will need to be confirmed)  Never done   COVID-19 Vaccine (4 - 2023-24 season) 01/02/2022   Lung Cancer Screening  04/02/2022    Colorectal cancer screening: due  Lung Cancer Screening: (Low Dose CT Chest recommended if Age 393-80ears, 30 pack-year currently smoking OR have quit w/in 15years.) does qualify.   Lung Cancer Screening Referral: sch for 06/11/2022  Additional Screening:  Hepatitis C Screening: does qualify;  Vision Screening: Recommended annual ophthalmology exams for early detection of glaucoma and other disorders of the eye. Is the patient up to date with their annual eye exam?  No  Who is the provider or what is the name of the office in which the patient attends annual eye exams? none If pt is not established with a provider, would they like to be referred to a provider to establish care? No .  Dental Screening: Recommended annual dental exams for proper oral hygiene  Community Resource Referral / Chronic Care Management: CRR required this visit?  No   CCM required this visit?  No      Plan:     I have personally reviewed and noted the following in the patient's chart:   Medical and social history Use of alcohol, tobacco or illicit drugs  Current medications and supplements including opioid prescriptions. Patient is not currently taking opioid prescriptions. Functional ability and status Nutritional status Physical activity Advanced directives List of other physicians Hospitalizations, surgeries,  and ER visits in previous 12 months Vitals Screenings to include cognitive, depression, and falls Referrals and appointments  In addition, I have reviewed and discussed with patient certain preventive protocols, quality metrics, and best practice recommendations. A written personalized care plan for preventive services as well as general preventive health recommendations were provided to patient.     Kellie Simmering, LPN   7/67/2094   Nurse Notes: none  Due to this being a virtual visit, the after visit summary with patients personalized plan was offered to patient via mail or my-chart.  Patient would like to access on my-chart

## 2022-06-10 ENCOUNTER — Encounter: Payer: Self-pay | Admitting: Acute Care

## 2022-06-10 ENCOUNTER — Ambulatory Visit (INDEPENDENT_AMBULATORY_CARE_PROVIDER_SITE_OTHER): Payer: Medicare Other | Admitting: Acute Care

## 2022-06-10 DIAGNOSIS — Z87891 Personal history of nicotine dependence: Secondary | ICD-10-CM

## 2022-06-10 NOTE — Progress Notes (Signed)
Virtual Visit via Telephone Note  I connected with Timothy [name] on 06/10/22 at  9:30 AM EST by telephone and verified that I am speaking with the correct person using two identifiers.  Location: Patient:  At home  Provider:  [place], [place], [place], [address]    I discussed the limitations, risks, security and privacy concerns of performing an evaluation and management service by telephone and the availability of in person appointments. I also discussed with the patient that there may be a patient responsible charge related to this service. The patient expressed understanding and agreed to proceed.   Shared Decision Making Visit Lung Cancer Screening Program [phone])   Eligibility: Age 67 y.o. Pack Years Smoking History Calculation 76 pack year smoking history (# packs/per year x # years smoked) Recent History of coughing up blood  no Unexplained weight loss? no ( >Than 15 pounds within the last 6 months ) Prior History Lung / other cancer no (Diagnosis within the last 5 years already requiring surveillance chest CT Scans). Smoking Status Former Smoker Former Smokers: Years since quit: 8 years  Quit Date: 07/10/2013  Visit Components: Discussion included one or more decision making aids. yes Discussion included risk/benefits of screening. yes Discussion included potential follow up diagnostic testing for abnormal scans. yes Discussion included meaning and risk of over diagnosis. yes Discussion included meaning and risk of False Positives. yes Discussion included meaning of total radiation exposure. yes  Counseling Included: Importance of adherence to annual lung cancer LDCT screening. yes Impact of comorbidities on ability to participate in the program. yes Ability and willingness to under diagnostic treatment. yes  Smoking Cessation Counseling: Current Smokers:  Discussed importance of smoking cessation. yes Information about tobacco cessation classes and  interventions provided to patient. yes Patient provided with "ticket" for LDCT Scan. yes Symptomatic Patient. no  Counseling NA Diagnosis Code: Tobacco Use Z72.0 Asymptomatic Patient yes  Counseling (Intermediate counseling: > three minutes counseling) Q2595 Former Smokers:  Discussed the importance of maintaining cigarette abstinence. yes Diagnosis Code: Personal History of Nicotine Dependence. G38.756 Information about tobacco cessation classes and interventions provided to patient. Yes Patient provided with "ticket" for LDCT Scan. yes Written Order for Lung Cancer Screening with LDCT placed in Epic. Yes (CT Chest Lung Cancer Screening Low Dose W/O CM) [id number] Z12.2-Screening of respiratory organs Z87.891-Personal history of nicotine dependence  I spent 25 minutes of face to face time/virtual visit time  with  Timothy [name] discussing the risks and benefits of lung cancer screening. We took the time to pause the power point at intervals to allow for questions to be asked and answered to ensure understanding. We discussed that he had taken the single most powerful action possible to decrease his risk of developing lung cancer when he quit smoking. I counseled him to remain smoke free, and to contact me if he ever had the desire to smoke again so that I can provide resources and tools to help support the effort to remain smoke free. We discussed the time and location of the scan, and that either  Doroteo Glassman RN, Joella Prince, RN or I  or I will call / send a letter with the results within  24-72 hours of receiving them. He has the office contact information in the event he needs to speak with me,  he verbalized understanding of all of the above and had no further questions upon leaving the office.     I explained to the patient that  there has been a high incidence of coronary artery disease noted on these exams. I explained that this is a non-gated exam therefore degree or severity cannot be  determined. This patient is on statin therapy. I have asked the patient to follow-up with their PCP regarding any incidental finding of coronary artery disease and management with diet or medication as they feel is clinically indicated. The patient verbalized understanding of the above and had no further questions.     Magdalen Spatz, NP 06/10/2022

## 2022-06-10 NOTE — Patient Instructions (Signed)
Thank you for participating in the [place] Lung Cancer Screening Program. It was our pleasure to meet you today. We will call you with the results of your scan within the next few days. Your scan will be assigned a Lung RADS category score by the physicians reading the scans.  This Lung RADS score determines follow up scanning.  See below for description of categories, and follow up screening recommendations. We will be in touch to schedule your follow up screening annually or based on recommendations of our providers. We will fax a copy of your scan results to your Primary Care Physician, or the physician who referred you to the program, to ensure they have the results. Please call the office if you have any questions or concerns regarding your scanning experience or results.  Our office number is [phone]. Please speak with Denise Phelps, RN. , or  Denise Buckner RN, They are  our Lung Cancer Screening RN.'s If They are unavailable when you call, Please leave a message on the voice mail. We will return your call at our earliest convenience.This voice mail is monitored several times a day.  Remember, if your scan is normal, we will scan you annually as long as you continue to meet the criteria for the program. (Age 50-80, Current smoker or smoker who has quit within the last 15 years). If you are a smoker, remember, quitting is the single most powerful action that you can take to decrease your risk of lung cancer and other pulmonary, breathing related problems. We know quitting is hard, and we are here to help.  Please let us know if there is anything we can do to help you meet your goal of quitting. If you are a former smoker, congratulations. We are proud of you! Remain smoke free! Remember you can refer friends or family members through the number above.  We will screen them to make sure they meet criteria for the program. Thank you for helping us take better care of you by  participating in Lung Screening.  You can receive free nicotine replacement therapy ( patches, gum or mints) by calling 1-800-QUIT NOW. Please call so we can get you on the path to becoming  a non-smoker. I know it is hard, but you can do this!  Lung RADS Categories:  Lung RADS 1: no nodules or definitely non-concerning nodules.  Recommendation is for a repeat annual scan in 12 months.  Lung RADS 2:  nodules that are non-concerning in appearance and behavior with a very low likelihood of becoming an active cancer. Recommendation is for a repeat annual scan in 12 months.  Lung RADS 3: nodules that are probably non-concerning , includes nodules with a low likelihood of becoming an active cancer.  Recommendation is for a 6-month repeat screening scan. Often noted after an upper respiratory illness. We will be in touch to make sure you have no questions, and to schedule your 6-month scan.  Lung RADS 4 A: nodules with concerning findings, recommendation is most often for a follow up scan in 3 months or additional testing based on our provider's assessment of the scan. We will be in touch to make sure you have no questions and to schedule the recommended 3 month follow up scan.  Lung RADS 4 B:  indicates findings that are concerning. We will be in touch with you to schedule additional diagnostic testing based on our provider's  assessment of the scan.  Other options for assistance in smoking cessation (   As covered by your insurance benefits)  Hypnosis for smoking cessation  [employer] [phone]  Acupuncture for smoking cessation  [facility] [phone]   

## 2022-06-11 ENCOUNTER — Ambulatory Visit
Admission: RE | Admit: 2022-06-11 | Discharge: 2022-06-11 | Disposition: A | Discharge status: Home / Self Care | Payer: Medicare Other | Source: Ambulatory Visit | Attending: Internal Medicine | Admitting: Internal Medicine

## 2022-06-11 DIAGNOSIS — Z87891 Personal history of nicotine dependence: Secondary | ICD-10-CM

## 2022-06-11 DIAGNOSIS — J8410 Pulmonary fibrosis, unspecified: Secondary | ICD-10-CM | POA: Diagnosis not present

## 2022-06-11 DIAGNOSIS — J432 Centrilobular emphysema: Secondary | ICD-10-CM | POA: Diagnosis not present

## 2022-06-11 DIAGNOSIS — I2510 Atherosclerotic heart disease of native coronary artery without angina pectoris: Secondary | ICD-10-CM | POA: Diagnosis not present

## 2022-06-11 DIAGNOSIS — Z122 Encounter for screening for malignant neoplasm of respiratory organs: Secondary | ICD-10-CM

## 2022-06-12 ENCOUNTER — Other Ambulatory Visit: Payer: Self-pay | Admitting: Acute Care

## 2022-06-12 DIAGNOSIS — Z87891 Personal history of nicotine dependence: Secondary | ICD-10-CM

## 2022-06-12 DIAGNOSIS — Z122 Encounter for screening for malignant neoplasm of respiratory organs: Secondary | ICD-10-CM

## 2022-06-17 ENCOUNTER — Other Ambulatory Visit: Payer: Self-pay

## 2022-06-17 DIAGNOSIS — I2510 Atherosclerotic heart disease of native coronary artery without angina pectoris: Secondary | ICD-10-CM

## 2022-06-17 MED ORDER — BISOPROLOL FUMARATE 5 MG PO TABS: ORAL_TABLET | ORAL | 2 refills | Status: DC

## 2022-06-25 ENCOUNTER — Encounter: Payer: Self-pay | Admitting: Internal Medicine

## 2022-06-25 ENCOUNTER — Ambulatory Visit: Payer: Medicare Other | Attending: Internal Medicine | Admitting: Internal Medicine

## 2022-06-25 VITALS — BP 150/80 | HR 64 | Temp 97.7°F | Ht 66.0 in | Wt 124.0 lb

## 2022-06-25 DIAGNOSIS — J9611 Chronic respiratory failure with hypoxia: Secondary | ICD-10-CM

## 2022-06-25 DIAGNOSIS — Z1211 Encounter for screening for malignant neoplasm of colon: Secondary | ICD-10-CM

## 2022-06-25 DIAGNOSIS — Z125 Encounter for screening for malignant neoplasm of prostate: Secondary | ICD-10-CM | POA: Diagnosis not present

## 2022-06-25 DIAGNOSIS — Z1159 Encounter for screening for other viral diseases: Secondary | ICD-10-CM

## 2022-06-25 DIAGNOSIS — R7303 Prediabetes: Secondary | ICD-10-CM

## 2022-06-25 DIAGNOSIS — I10 Essential (primary) hypertension: Secondary | ICD-10-CM

## 2022-06-25 DIAGNOSIS — J439 Emphysema, unspecified: Secondary | ICD-10-CM | POA: Diagnosis not present

## 2022-06-25 DIAGNOSIS — D692 Other nonthrombocytopenic purpura: Secondary | ICD-10-CM

## 2022-06-25 DIAGNOSIS — I2510 Atherosclerotic heart disease of native coronary artery without angina pectoris: Secondary | ICD-10-CM | POA: Diagnosis not present

## 2022-06-25 DIAGNOSIS — Z23 Encounter for immunization: Secondary | ICD-10-CM

## 2022-06-25 DIAGNOSIS — Z9981 Dependence on supplemental oxygen: Secondary | ICD-10-CM

## 2022-06-25 LAB — POCT GLYCOSYLATED HEMOGLOBIN (HGB A1C): HbA1c, POC (prediabetic range): 5.9 % (ref 5.7–6.4)

## 2022-06-25 LAB — GLUCOSE, POCT (MANUAL RESULT ENTRY): POC Glucose: 113 mg/dl — AB (ref 70–99)

## 2022-06-25 MED ORDER — ZOSTER VAC RECOMB ADJUVANTED 50 MCG/0.5ML IM SUSR: 0.5000 mL | Freq: Once | INTRAMUSCULAR | 0 refills | Status: AC

## 2022-06-25 MED ORDER — AMLODIPINE BESYLATE 5 MG PO TABS: 5.0000 mg | ORAL_TABLET | Freq: Every day | ORAL | 1 refills | Status: DC

## 2022-06-25 MED ORDER — ATORVASTATIN CALCIUM 80 MG PO TABS: 80.0000 mg | ORAL_TABLET | Freq: Every day | ORAL | 3 refills | Status: DC

## 2022-06-25 NOTE — Patient Instructions (Signed)

## 2022-06-25 NOTE — Progress Notes (Signed)
Patient ID: Timothy [name], male    DOB: [birth year]  MRN: [id number]  CC: COPD (COPD & pre diabetes f/u. Med refill. /Requesting to have a Handicap parking form completed. [name] received flu vax. )   Subjective: Timothy [name] is a 67 y.o. male who presents for chronic ds management His concerns today include:  hx of HTN, cardiac arrest vfib/vtach 06/2013, CAD with stent to RCA, COPD Gold stage III, former smoker, pre-DM, lung nodule on CT 08/2015 and bladder CA.   HTN/CAD/HL: No CP/LE edema/HA.  No recent use of SL Nitro Out of Lipitor for 1-2 mths.  Compliant withZebeta 66m 1/2 tab daily, ASA/Brilinta.  Endorses easy bruising on aspirin and Brilinta.  No bleeding.  PreDM: Results for orders placed or performed in visit on 06/25/22  POCT glucose (manual entry)  Result Value Ref Range   POC Glucose 113 (A) 70 - 99 mg/dl  POCT glycosylated hemoglobin (Hb A1C)  Result Value Ref Range   Hemoglobin A1C     HbA1c POC (<> result, manual entry)     HbA1c, POC (prediabetic range) 5.9 5.7 - 6.4 %   HbA1c, POC (controlled diabetic range)    Has been off Metformin for a while Feels he can do better with his eating habits.  Drinks sweet tea and soft drinks  COPD GOLD III:  Doing okay on Breztri using daily and Albuterol 1-2x/day.  Has not used neb machine in a while On O2  2 LT at nights; wakes up SOB if he does not use it.  Occasionally has to use during the day.  On O2 for about 5 yrs Wants handicap sticker   HM: Due for shingles vaccine, colon cancer screening and hepatitis C screening. Patient Active Problem List   Diagnosis Date Noted   Influenza A 04/09/2021   Acute on chronic respiratory failure with hypoxia (HCC) 04/09/2021   COPD exacerbation ([facility] 04/03/2021   COPD (chronic obstructive pulmonary disease) ([facility] 04/02/2021   Exercise hypoxemia 07/02/2020   Acute STEMI of inferior wall  02/24/2020   Malignant neoplasm of urinary bladder ([facility] 02/23/2017   Lung nodule  11/20/2016   Gastroesophageal reflux disease without esophagitis 11/20/2016   Prediabetes 12/29/2013   Dental cavities 12/29/2013   DOE (dyspnea on exertion) 08/30/2013   Essential hypertension 08/03/2013   CAD S/P percutaneous coronary angioplasty 06/19/2013   Hyperlipidemia with target LDL less than 70 06/19/2013   Former cigarette smoker 06/19/2013   NSTEMI (non-ST elevated myocardial infarction), DES to RCA 06/13/2013   VT (ventricular tachycardia), requiring 2 shocks 06/12/2013   COPD GOLD III 06/12/2013     Current Outpatient Medications on File Prior to Visit  Medication Sig Dispense Refill   acetaminophen (TYLENOL) 500 MG tablet Take 500-1,000 mg by mouth every 8 (eight) hours as needed for mild pain or headache.      albuterol (VENTOLIN HFA) 108 (90 Base) MCG/ACT inhaler Inhale 2 puffs into the lungs every 6 (six) hours as needed for wheezing or shortness of breath. 8 g 5   aspirin EC 81 MG tablet Take 1 tablet (81 mg total) by mouth daily. 90 tablet 2   bisoprolol (ZEBETA) 5 MG tablet TAKE 1/2 TABLET(2.5 MG) BY MOUTH AT BEDTIME 45 tablet 2   BRILINTA 90 MG TABS tablet TAKE 1 TABLET(90 MG) BY MOUTH TWICE DAILY 180 tablet 3   Budeson-Glycopyrrol-Formoterol (BREZTRI AEROSPHERE) 160-9-4.8 MCG/ACT AERO Inhale 2 puffs into the lungs 2 (two) times daily. 10.7 g 5   famotidine (  PEPCID) 20 MG tablet Take 20 mg by mouth daily as needed for heartburn or indigestion.     nitroGLYCERIN (NITROSTAT) 0.4 MG SL tablet Place 1 tablet (0.4 mg total) under the tongue every 5 (five) minutes as needed for chest pain. NEED OV. 25 tablet 2   No current facility-administered medications on file prior to visit.    Allergies  Allergen Reactions   Ace Inhibitors Other (See Comments)    Caused wheezing   Aspirin Swelling and Other (See Comments)    Lips swell- still takes approx 2 times a week in 2021   Ibuprofen Swelling and Other (See Comments)    Lips swell   Losartan Other (See Comments)    Leg  cramps    Social History   Socioeconomic History   Marital status: Divorced    Spouse name: Not on file   Number of children: Not on file   Years of education: Not on file   Highest education level: Not on file  Occupational History   Not on file  Tobacco Use   Smoking status: Former    Packs/day: 2.00    Years: 38.00    Total pack years: 76.00    Types: Cigarettes    Quit date: 07/10/2013    Years since quitting: 8.9   Smokeless tobacco: Never  Vaping Use   Vaping Use: Never used  Substance and Sexual Activity   Alcohol use: No   Drug use: No    Types: Marijuana   Sexual activity: Yes  Other Topics Concern   Not on file  Social History Narrative   Not on file   Social Determinants of Health   Financial Resource Strain: Low Risk  (05/28/2022)   Overall Financial Resource Strain (CARDIA)    Difficulty of Paying Living Expenses: Not hard at all  Food Insecurity: No Food Insecurity (05/28/2022)   Hunger Vital Sign    Worried About Running Out of Food in the Last Year: Never true    Ran Out of Food in the Last Year: Never true  Transportation Needs: No Transportation Needs (05/28/2022)   PRAPARE - Hydrologist (Medical): No    Lack of Transportation (Non-Medical): No  Physical Activity: Inactive (05/28/2022)   Exercise Vital Sign    Days of Exercise per Week: 0 days    Minutes of Exercise per Session: 0 min  Stress: No Stress Concern Present (05/28/2022)   [place]    Feeling of Stress : Not at all  Social Connections: Not on file  Intimate Partner Violence: Not on file    Family History  Problem Relation Age of Onset   Hypertension Other     Past Surgical History:  Procedure Laterality Date   cardiac sstent     CORONARY ANGIOPLASTY WITH STENT PLACEMENT  06/12/13   Xience Alpine DES to RCA, NSTEMI   CORONARY STENT INTERVENTION N/A 02/24/2020   Procedure: CORONARY  STENT INTERVENTION;  Surgeon: Belva Crome, MD;  Location: [place] [facility];  Service: Cardiovascular;  Laterality: N/A;   CORONARY/GRAFT ACUTE MI REVASCULARIZATION N/A 02/24/2020   Procedure: Coronary/Graft Acute MI Revascularization;  Surgeon: Belva Crome, MD;  Location: [place] [facility];  Service: Cardiovascular;  Laterality: N/A;   CYSTOSCOPY W/ RETROGRADES Left 03/29/2017   Procedure: CYSTOSCOPY WITH LEFT RETROGRADE PYELOGRAM;  Surgeon: Kathie Rhodes, MD;  [facility];  Service: Urology;  Laterality: Left;   LEFT  HEART CATH AND CORONARY ANGIOGRAPHY N/A 02/24/2020   Procedure: LEFT HEART CATH AND CORONARY ANGIOGRAPHY;  Surgeon: Belva Crome, MD;  Location: [place] [facility];  Service: Cardiovascular;  Laterality: N/A;   LEFT HEART CATHETERIZATION WITH CORONARY ANGIOGRAM N/A 06/12/2013   Procedure: LEFT HEART CATHETERIZATION WITH CORONARY ANGIOGRAM;  Surgeon: Troy Sine, MD;  Location: [facility];  Service: Cardiovascular;  Laterality: N/A;   NO PAST SURGERIES     TRANSURETHRAL RESECTION OF BLADDER TUMOR N/A 03/29/2017   Procedure: TRANSURETHRAL RESECTION OF BLADDER TUMOR (TURBT)/;  Surgeon: Kathie Rhodes, MD;  [facility];  Service: Urology;  Laterality: N/A;   transurethral ressection of bladder tumor     Dr. Karsten Ro 03/29/17    ROS: Review of Systems Negative except as stated above  PHYSICAL EXAM: BP (!) 150/80   Pulse 64   Temp 97.7 F (36.5 C) (Oral)   Ht 5' 6"$  (1.676 m)   Wt 124 lb (56.2 kg)   SpO2 95%   BMI 20.01 kg/m   Physical Exam   General appearance - alert, well appearing, and in no distress Mental status - normal mood, behavior, speech, dress, motor activity, and thought processes Neck - supple, no significant adenopathy Chest -breath sounds moderately decreased bilaterally.  No wheezes or crackles heard. Heart - normal rate, regular rhythm, normal S1, S2, no murmurs, rubs, clicks or gallops Extremities - peripheral pulses normal, no  pedal edema, no clubbing or cyanosis Skin: A few scattered ecchymosis/purpura on the forearms.    Latest Ref Rng & Units 04/09/2021   12:33 AM 04/07/2021   10:41 AM 04/06/2021    9:50 AM  CMP  Glucose 70 - 99 mg/dL 116  120  146   BUN 8 - 23 mg/dL 21  21  25   $ Creatinine 0.61 - 1.24 mg/dL 0.78  0.84  0.86   Sodium 135 - 145 mmol/L 131  133  133   Potassium 3.5 - 5.1 mmol/L 4.9  4.5  5.0   Chloride 98 - 111 mmol/L 97  99  97   CO2 22 - 32 mmol/L 24  27  26   $ Calcium 8.9 - 10.3 mg/dL 8.8  8.9  9.3   Total Protein 6.5 - 8.1 g/dL   6.9   Total Bilirubin 0.3 - 1.2 mg/dL   0.8   Alkaline Phos 38 - 126 U/L   63   AST 15 - 41 U/L   24   ALT 0 - 44 U/L   32    Lipid Panel     Component Value Date/Time   CHOL 124 08/28/2021 0905   TRIG 107 08/28/2021 0905   HDL 47 08/28/2021 0905   CHOLHDL 2.6 08/28/2021 0905   CHOLHDL 5.6 02/24/2020 1253   VLDL 42 (H) 02/24/2020 1253   LDLCALC 57 08/28/2021 0905    CBC    Component Value Date/Time   WBC 12.3 (H) 04/21/2021 1140   WBC 17.7 (H) 04/07/2021 1041   RBC 5.09 04/21/2021 1140   RBC 5.39 04/07/2021 1041   HGB 15.8 04/21/2021 1140   HCT 45.7 04/21/2021 1140   PLT 383 04/07/2021 1041   PLT 353 11/20/2016 0953   MCV 90 04/21/2021 1140   MCH 31.0 04/21/2021 1140   MCH 30.8 04/07/2021 1041   MCHC 34.6 04/21/2021 1140   MCHC 33.7 04/07/2021 1041   RDW 12.2 04/21/2021 1140   LYMPHSABS 1.7 04/21/2021 1140   MONOABS 2.4 (H) 04/07/2021 1041  EOSABS 0.1 04/21/2021 1140   BASOSABS 0.1 04/21/2021 1140    ASSESSMENT AND PLAN:  1. Essential hypertension Not at goal.  Continue bisoprolol . Add low-dose Norvasc. - CBC - Comprehensive metabolic panel - amLODipine (NORVASC) 5 MG tablet; Take 1 tablet (5 mg total) by mouth daily.  Dispense: 90 tablet; Refill: 1  2. Coronary artery disease involving native coronary artery of native heart without angina pectoris Stable.  Refill atorvastatin.  Continue bisoprolol, aspirin and Brilinta -  Lipid panel - atorvastatin (LIPITOR) 80 MG tablet; Take 1 tablet (80 mg total) by mouth at bedtime.  Dispense: 90 tablet; Refill: 3  3. Pulmonary emphysema, unspecified emphysema type ([place]) Stable on Breztri Form completed for handicap sticker  4. Chronic respiratory failure with hypoxia, on home O2 therapy (HCC) On O2 at nights  5. Senile purpura (HCC) On aspirin and Brilinta  6. Prediabetes Patient advised to eliminate sugary drinks from the diet, cut back on portion sizes especially of white carbohydrates, eat more white lean meat like chicken [place] and seafood instead of beef or pork and incorporate fresh fruits and vegetables into the diet daily.  - POCT glucose (manual entry) - POCT glycosylated hemoglobin (Hb A1C)  7. Prostate cancer screening Patient agreeable to screening - PSA  8. Need for shingles vaccine Patient agreeable to receiving Shingrix vaccine series.  I have printed the prescription for him to take to his pharmacy - Zoster Vaccine Adjuvanted [facility]) injection; Inject 0.5 mLs into the muscle once for 1 dose.  Dispense: 0.5 mL; Refill: 0  9. Screening for colon cancer Prefers Cologuard instead of colonoscopy - Cologuard  10. Need for hepatitis C screening test - Hepatitis C Antibody    Patient was given the opportunity to ask questions.  Patient verbalized understanding of the plan and was able to repeat key elements of the plan.   This documentation was completed using Radio producer.  Any transcriptional errors are unintentional.  Orders Placed This Encounter  Procedures   CBC   Comprehensive metabolic panel   Lipid panel   PSA   Cologuard   Hepatitis C Antibody   POCT glucose (manual entry)   POCT glycosylated hemoglobin (Hb A1C)     Requested Prescriptions   Signed Prescriptions Disp Refills   Zoster Vaccine Adjuvanted (SHINGRIX) injection 0.5 mL 0    Sig: Inject 0.5 mLs into the muscle once for 1 dose.    amLODipine (NORVASC) 5 MG tablet 90 tablet 1    Sig: Take 1 tablet (5 mg total) by mouth daily.   atorvastatin (LIPITOR) 80 MG tablet 90 tablet 3    Sig: Take 1 tablet (80 mg total) by mouth at bedtime.    Return in about 4 months (around 10/24/2022).  Karle Plumber, MD, FACP

## 2022-06-26 LAB — CBC
Hematocrit: 50.6 % (ref 37.5–51.0)
Hemoglobin: 16.9 g/dL (ref 13.0–17.7)
MCH: 30.2 pg (ref 26.6–33.0)
MCHC: 33.4 g/dL (ref 31.5–35.7)
MCV: 91 fL (ref 79–97)
Platelets: 436 10*3/uL (ref 150–450)
RBC: 5.59 x10E6/uL (ref 4.14–5.80)
RDW: 12.5 % (ref 11.6–15.4)
WBC: 14.5 10*3/uL — ABNORMAL HIGH (ref 3.4–10.8)

## 2022-06-26 LAB — COMPREHENSIVE METABOLIC PANEL
ALT: 17 IU/L (ref 0–44)
AST: 16 IU/L (ref 0–40)
Albumin/Globulin Ratio: 1.9 (ref 1.2–2.2)
Albumin: 4.8 g/dL (ref 3.9–4.9)
Alkaline Phosphatase: 89 IU/L (ref 44–121)
BUN/Creatinine Ratio: 11 (ref 10–24)
BUN: 10 mg/dL (ref 8–27)
Bilirubin Total: 0.4 mg/dL (ref 0.0–1.2)
CO2: 26 mmol/L (ref 20–29)
Calcium: 10.1 mg/dL (ref 8.6–10.2)
Chloride: 99 mmol/L (ref 96–106)
Creatinine, Ser: 0.88 mg/dL (ref 0.76–1.27)
Globulin, Total: 2.5 g/dL (ref 1.5–4.5)
Glucose: 93 mg/dL (ref 70–99)
Potassium: 4.9 mmol/L (ref 3.5–5.2)
Sodium: 140 mmol/L (ref 134–144)
Total Protein: 7.3 g/dL (ref 6.0–8.5)
eGFR: 95 mL/min/{1.73_m2} (ref >59)

## 2022-06-26 LAB — HEPATITIS C ANTIBODY: Hep C Virus Ab: NONREACTIVE

## 2022-06-26 LAB — LIPID PANEL
Chol/HDL Ratio: 4.9 ratio (ref 0.0–5.0)
Cholesterol, Total: 279 mg/dL — ABNORMAL HIGH (ref 100–199)
HDL: 57 mg/dL (ref >39)
LDL Chol Calc (NIH): 188 mg/dL — ABNORMAL HIGH (ref 0–99)
Triglycerides: 180 mg/dL — ABNORMAL HIGH (ref 0–149)
VLDL Cholesterol Cal: 34 mg/dL (ref 5–40)

## 2022-06-26 LAB — PSA: Prostate Specific Ag, Serum: 6.8 ng/mL — ABNORMAL HIGH (ref 0.0–4.0)

## 2022-06-27 ENCOUNTER — Telehealth: Payer: Self-pay | Admitting: Internal Medicine

## 2022-06-27 DIAGNOSIS — D72829 Elevated white blood cell count, unspecified: Secondary | ICD-10-CM

## 2022-06-27 DIAGNOSIS — R972 Elevated prostate specific antigen [PSA]: Secondary | ICD-10-CM

## 2022-06-27 NOTE — Telephone Encounter (Signed)
Phone call placed to patient this afternoon to go over lab results.  Patient informed that his PSA level is elevated.  I recommend referral to urologist to evaluate this further.  Patient is agreeable to the referral. Cholesterol levels are elevated.  He was out of the atorvastatin.  I sent a refill on this when I saw him yesterday.  Patient states that he has already filled the prescription. He has persistent elevation in his white blood cell count.  I recommend referral to a hematologist to evaluate this further.  Patient agreeable to the referral. Kidney and liver function tests good. Screen for hepatitis C negative.  All questions were answered.  Results for orders placed or performed in visit on 06/25/22  CBC  Result Value Ref Range   WBC 14.5 (H) 3.4 - 10.8 x10E3/uL   RBC 5.59 4.14 - 5.80 x10E6/uL   Hemoglobin 16.9 13.0 - 17.7 g/dL   Hematocrit 50.6 37.5 - 51.0 %   MCV 91 79 - 97 fL   MCH 30.2 26.6 - 33.0 pg   MCHC 33.4 31.5 - 35.7 g/dL   RDW 12.5 11.6 - 15.4 %   Platelets 436 150 - 450 x10E3/uL  Comprehensive metabolic panel  Result Value Ref Range   Glucose 93 70 - 99 mg/dL   BUN 10 8 - 27 mg/dL   Creatinine, Ser 0.88 0.76 - 1.27 mg/dL   eGFR 95 >59 mL/min/1.73   BUN/Creatinine Ratio 11 10 - 24   Sodium 140 134 - 144 mmol/L   Potassium 4.9 3.5 - 5.2 mmol/L   Chloride 99 96 - 106 mmol/L   CO2 26 20 - 29 mmol/L   Calcium 10.1 8.6 - 10.2 mg/dL   Total Protein 7.3 6.0 - 8.5 g/dL   Albumin 4.8 3.9 - 4.9 g/dL   Globulin, Total 2.5 1.5 - 4.5 g/dL   Albumin/Globulin Ratio 1.9 1.2 - 2.2   Bilirubin Total 0.4 0.0 - 1.2 mg/dL   Alkaline Phosphatase 89 44 - 121 IU/L   AST 16 0 - 40 IU/L   ALT 17 0 - 44 IU/L  Lipid panel  Result Value Ref Range   Cholesterol, Total 279 (H) 100 - 199 mg/dL   Triglycerides 180 (H) 0 - 149 mg/dL   HDL 57 >39 mg/dL   VLDL Cholesterol Cal 34 5 - 40 mg/dL   LDL Chol Calc (NIH) 188 (H) 0 - 99 mg/dL   Chol/HDL Ratio 4.9 0.0 - 5.0 ratio  PSA  Result  Value Ref Range   Prostate Specific Ag, Serum 6.8 (H) 0.0 - 4.0 ng/mL  Hepatitis C Antibody  Result Value Ref Range   Hep C Virus Ab Non Reactive Non Reactive  POCT glucose (manual entry)  Result Value Ref Range   POC Glucose 113 (A) 70 - 99 mg/dl  POCT glycosylated hemoglobin (Hb A1C)  Result Value Ref Range   Hemoglobin A1C     HbA1c POC (<> result, manual entry)     HbA1c, POC (prediabetic range) 5.9 5.7 - 6.4 %   HbA1c, POC (controlled diabetic range)

## 2022-06-29 ENCOUNTER — Telehealth: Payer: Self-pay | Admitting: Hematology and Oncology

## 2022-06-29 NOTE — Telephone Encounter (Signed)
Scheduled appt per 2/23 referral. Pt is aware of appt date and time. Pt is aware to arrive 15 mins prior to appt time and to bring and updated insurance card. Pt is aware of appt location.   °

## 2022-07-02 ENCOUNTER — Ambulatory Visit: Payer: Medicare Other | Admitting: Urology

## 2022-07-02 ENCOUNTER — Encounter: Payer: Self-pay | Admitting: Urology

## 2022-07-02 VITALS — BP 149/81 | HR 79 | Ht 66.0 in | Wt 124.0 lb

## 2022-07-02 DIAGNOSIS — R972 Elevated prostate specific antigen [PSA]: Secondary | ICD-10-CM

## 2022-07-02 DIAGNOSIS — C679 Malignant neoplasm of bladder, unspecified: Secondary | ICD-10-CM

## 2022-07-02 DIAGNOSIS — N402 Nodular prostate without lower urinary tract symptoms: Secondary | ICD-10-CM | POA: Diagnosis not present

## 2022-07-02 DIAGNOSIS — Z8551 Personal history of malignant neoplasm of bladder: Secondary | ICD-10-CM

## 2022-07-02 LAB — URINALYSIS
Bilirubin, UA: NEGATIVE
Blood, UA: NEGATIVE
Glucose, UA: NEGATIVE mg/dL
Ketones, UA: NEGATIVE
Nitrite, UA: POSITIVE
Protein, UA: NEGATIVE
Spec Grav, UA: 1.02 (ref 1.010–1.025)
Urobilinogen, UA: 0.2 E.U./dL
pH, UA: 6 (ref 5.0–8.0)

## 2022-07-02 NOTE — Patient Instructions (Signed)
Prostate Biopsy Instructions  Stop all aspirin or blood thinners (aspirin, plavix, coumadin, warfarin, motrin, ibuprofen, advil, aleve, naproxen, naprosyn) for 7 days prior to the procedure.  If you have any questions about stopping these medications, please contact your primary care physician or cardiologist.  Having a light meal prior to the procedure is recommended.  If you are diabetic or have low blood sugar please bring a small snack or glucose tablet.  A Fleets enema is needed and can be purchased over the counter at a local pharmacy. This will need to be administered 2 hours prior to your procedure. Antibiotics will be administered in the clinic at the time of the procedure unless otherwise specified.    If you have any questions or concerns, please feel free to call the office at [phone] or send a Mychart message.   Thank you,  Staff at [facility]

## 2022-07-02 NOTE — Progress Notes (Signed)
Assessment: 1. Elevated PSA   2. Malignant neoplasm of urinary bladder, unspecified site ([name])   3. Nodular prostate      Plan: Today had a long and detailed with the patient regarding his urologic issues including history of low risk nonmuscle invasive bladder cancer and recent elevated PSA and findings of a suspicious digital rectal exam today.  I discussed with the patient options for further evaluation including prostate imaging and biopsy.  Following our discussion he elects to proceed with transrectal ultrasound and prostate biopsy. Timothy [name] of procedure discussed in detail including potential adverse events and complications.  I especially emphasized the risk of bleeding and infection.  The patient will need to be off of his Brilinta but may continue his aspirin 81 mg.  He has stopped his antiplatelet meds previously for surgical intervention.  He is going to check with his cardiologist as well.  Given his lack of bladder cancer follow-up at some point he will need follow-up cystoscopy.  Chief Complaint:  Chief Complaint  Patient presents with   Elevated PSA    History of Present Illness:  Timothy [name] is a 67 y.o. male who is seen in consultation from Timothy Pier, MD for evaluation of elevated PSA. Patient has a very complex past medical history including coronary artery disease with h/o MI; cardiac arrest 2015 with subsequent cardiac stent placement, significant COPD (former smoker), prediabetes, lung nodule on CT 08/2015.  Patient had screening psa 06/2022= 6.8 No known prior PSA testing. No fam hx of CaP Patient denies significant LUTS.  UA today is dip + for leuks and nitrate +.  Patient is asymptomatic.  Will send for micro and culture.  Note is made on chart review of prior suspicious ua's with culture showing multiple organisms.  Patient also has a history of low risk nonmuscle invasive bladder cancer. Status post TURBT 03/2017-low-grade TA.  Patient did not  receive perioperative chemo due to bladder perforation.  Patient has not had routine follow-up// last cystoscopy 10/2018 in [place] Dr. Karsten [name] negative.  Past Medical History:  Past Medical History:  Diagnosis Date   Altered mental status, improved at discharge 06/19/2013   Bronchitis, chronic ([place]) 06/19/2013   CAD (coronary artery disease)    a. s/p cardiac arrest in 06/2013 with DES to RCA b. 02/2020: Inferior STEMI and cath showing total occlusion of mid-RCA treated with DES placement.    Cancer [facility])    bladder   Cardiac arrest, hypothermic protocol 06/12/2013   COPD exacerbation ([place]) 06/12/2013   GERD (gastroesophageal reflux disease)    Hyperlipidemia LDL goal < 70 06/19/2013   Hypokalemia, replaced 06/19/2013   Hypomagnesemia, replaced 06/19/2013   Hypotension, requiring levophed 06/19/2013   NSTEMI (non-ST elevated myocardial infarction), DES to RCA 06/13/2013   Pre-diabetes    Tobacco abuse 06/19/2013    Past Surgical History:  Past Surgical History:  Procedure Laterality Date   cardiac sstent     CORONARY ANGIOPLASTY WITH STENT PLACEMENT  06/12/13   Xience Alpine DES to RCA, NSTEMI   CORONARY STENT INTERVENTION N/A 02/24/2020   Procedure: CORONARY STENT INTERVENTION;  Surgeon: Timothy Crome, MD;  Location: [place] [facility];  Service: Cardiovascular;  Laterality: N/A;   CORONARY/GRAFT ACUTE MI REVASCULARIZATION N/A 02/24/2020   Procedure: Coronary/Graft Acute MI Revascularization;  Surgeon: Timothy Crome, MD;  Location: [place] [facility];  Service: Cardiovascular;  Laterality: N/A;   CYSTOSCOPY W/ RETROGRADES Left 03/29/2017   Procedure: CYSTOSCOPY WITH LEFT RETROGRADE PYELOGRAM;  Surgeon: Timothy Rhodes, MD;  [facility];  Service: Urology;  Laterality: Left;   LEFT HEART CATH AND CORONARY ANGIOGRAPHY N/A 02/24/2020   Procedure: LEFT HEART CATH AND CORONARY ANGIOGRAPHY;  Surgeon: Timothy Crome, MD;  Location: [place] [facility];  Service: Cardiovascular;  Laterality:  N/A;   LEFT HEART CATHETERIZATION WITH CORONARY ANGIOGRAM N/A 06/12/2013   Procedure: LEFT HEART CATHETERIZATION WITH CORONARY ANGIOGRAM;  Surgeon: Timothy Sine, MD;  Location: [facility] CATH LAB;  Service: Cardiovascular;  Laterality: N/A;   NO PAST SURGERIES     TRANSURETHRAL RESECTION OF BLADDER TUMOR N/A 03/29/2017   Procedure: TRANSURETHRAL RESECTION OF BLADDER TUMOR (TURBT)/;  Surgeon: Timothy Rhodes, MD;  [facility];  Service: Urology;  Laterality: N/A;   transurethral ressection of bladder tumor     Dr. Karsten [name] 03/29/17    Allergies:  Allergies  Allergen Reactions   Ace Inhibitors Other (See Comments)    Caused wheezing   Aspirin Swelling and Other (See Comments)    Lips swell- still takes approx 2 times a week in 2021   Ibuprofen Swelling and Other (See Comments)    Lips swell   Losartan Other (See Comments)    Leg cramps    Family History:  Family History  Problem Relation Age of Onset   Hypertension Other     Social History:  Social History   Tobacco Use   Smoking status: Former    Packs/day: 2.00    Years: 38.00    Total pack years: 76.00    Types: Cigarettes    Quit date: 07/10/2013    Years since quitting: 8.9   Smokeless tobacco: Never  Vaping Use   Vaping Use: Never used  Substance Use Topics   Alcohol use: No   Drug use: No    Types: Marijuana    Review of symptoms:  Constitutional:  Negative for unexplained weight loss, night sweats, fever, chills ENT:  Negative for nose bleeds, sinus pain, painful swallowing CV:  Negative for chest pain, positive for occasional shortness of breath and exercise intolerance Resp:  positive for cough, wheezing, shortness of breath GI:  Negative for nausea, vomiting, diarrhea, bloody stools GU:  Positives noted in HPI; otherwise negative for gross hematuria, dysuria, urinary incontinence Neuro:  Negative for seizures, poor balance, limb weakness, slurred speech Psych:  Negative for lack of energy, depression,  anxiety Endocrine:  Negative for polydipsia, polyuria, symptoms of hypoglycemia (dizziness, hunger, sweating) Hematologic:  positive for taking dual antiplatlet threapy Allergic:  Negative for difficulty breathing or choking as a result of exposure to anything; no shellfish allergy; no allergic response (rash/itch) to materials, foods  Physical exam: BP (!) 149/81   Pulse 79   Ht '5\' 6"'$  (1.676 m)   Wt 124 lb (56.2 kg)   BMI 20.01 kg/m  GENERAL APPEARANCE:  Well appearing, well developed, well nourished, NAD HEENT: Atraumatic, Normocephalic, oropharynx clear. ABDOMEN: Soft, non-tender, No Masses.  GU:  circ phallus.  Nl testes and cords.  Reducible / nontenter RIH DRE:  nst, prostate 50gm with bilateral nodularity/ very suspicious for CAP  EXTREMITIES: Moves all extremities well.   NEUROLOGIC:  Alert and oriented x 3, normal gait MENTAL STATUS:  Appropriate. BACK:  Non-tender to palpation.  No CVAT SKIN:  Warm, dry and intact.

## 2022-07-07 ENCOUNTER — Telehealth: Payer: Self-pay

## 2022-07-07 ENCOUNTER — Other Ambulatory Visit: Payer: Self-pay | Admitting: Urology

## 2022-07-07 LAB — URINE CULTURE

## 2022-07-07 MED ORDER — SULFAMETHOXAZOLE-TRIMETHOPRIM 800-160 MG PO TABS: 1.0000 | ORAL_TABLET | Freq: Two times a day (BID) | ORAL | 0 refills | Status: DC

## 2022-07-07 NOTE — Telephone Encounter (Signed)
-----   Message from Pamala Hurry, MD sent at 07/07/2022  1:11 PM EST ----- Regarding: urine culture/UTI Please call him.  Urine culture came back positive.  He is scheduled for a prostate biopsy later this month and the UTI needs to get treated prior to the biopsy.  I have sent in a prescription to the [place] he had listed for Bactrim to take twice a day for 1 week.  Please let him know to pick that up and get started as soon as possible.  We also need him to come by the office a few days prior to his biopsy to have a follow-up urinalysis to make sure that his UTI has cleared.  Please arrange.

## 2022-07-07 NOTE — Telephone Encounter (Signed)
Left msg that it was important for patient to return call to office. Sent a [place] with results as well.

## 2022-07-08 NOTE — Telephone Encounter (Signed)
Spoke with patient, who stated that he had already picked up the antibiotics and started them. Advised him to come tot he office on 07/20/22 to drop off a urine to ensure the infections has cleared prior to the biopsy. Patient verbalized understanding.

## 2022-07-13 DIAGNOSIS — Z1211 Encounter for screening for malignant neoplasm of colon: Secondary | ICD-10-CM | POA: Diagnosis not present

## 2022-07-15 ENCOUNTER — Other Ambulatory Visit: Payer: Medicare Other | Admitting: Urology

## 2022-07-20 ENCOUNTER — Ambulatory Visit: Payer: Medicare Other | Admitting: Urology

## 2022-07-20 DIAGNOSIS — R972 Elevated prostate specific antigen [PSA]: Secondary | ICD-10-CM | POA: Diagnosis not present

## 2022-07-20 LAB — URINALYSIS, ROUTINE W REFLEX MICROSCOPIC
Bilirubin, UA: NEGATIVE
Glucose, UA: NEGATIVE
Ketones, UA: NEGATIVE
Nitrite, UA: NEGATIVE
Protein,UA: NEGATIVE
RBC, UA: NEGATIVE
Specific Gravity, UA: 1.015 (ref 1.005–1.030)
Urobilinogen, Ur: 0.2 mg/dL (ref 0.2–1.0)
pH, UA: 7 (ref 5.0–7.5)

## 2022-07-20 LAB — MICROSCOPIC EXAMINATION
Cast Type: NONE SEEN
Casts: NONE SEEN /lpf
Crystal Type: NONE SEEN
Crystals: NONE SEEN
Epithelial Cells (non renal): NONE SEEN /hpf (ref 0–10)
RBC, Urine: NONE SEEN /hpf (ref 0–2)
Renal Epithel, UA: NONE SEEN /hpf
Trichomonas, UA: NONE SEEN
Yeast, UA: NONE SEEN

## 2022-07-20 LAB — COLOGUARD: COLOGUARD: POSITIVE — AB

## 2022-07-22 ENCOUNTER — Ambulatory Visit (INDEPENDENT_AMBULATORY_CARE_PROVIDER_SITE_OTHER): Payer: Medicare Other | Admitting: Urology

## 2022-07-22 ENCOUNTER — Ambulatory Visit (HOSPITAL_BASED_OUTPATIENT_CLINIC_OR_DEPARTMENT_OTHER)
Admission: RE | Admit: 2022-07-22 | Discharge: 2022-07-22 | Disposition: A | Discharge status: Home / Self Care | Payer: Medicare Other | Source: Ambulatory Visit | Attending: Urology | Admitting: Urology

## 2022-07-22 ENCOUNTER — Other Ambulatory Visit: Payer: Self-pay | Admitting: Internal Medicine

## 2022-07-22 VITALS — BP 137/86 | HR 91

## 2022-07-22 DIAGNOSIS — Z2989 Encounter for other specified prophylactic measures: Secondary | ICD-10-CM | POA: Diagnosis not present

## 2022-07-22 DIAGNOSIS — R972 Elevated prostate specific antigen [PSA]: Secondary | ICD-10-CM | POA: Insufficient documentation

## 2022-07-22 DIAGNOSIS — C679 Malignant neoplasm of bladder, unspecified: Secondary | ICD-10-CM

## 2022-07-22 DIAGNOSIS — N402 Nodular prostate without lower urinary tract symptoms: Secondary | ICD-10-CM

## 2022-07-22 DIAGNOSIS — Z8551 Personal history of malignant neoplasm of bladder: Secondary | ICD-10-CM

## 2022-07-22 DIAGNOSIS — R195 Other fecal abnormalities: Secondary | ICD-10-CM

## 2022-07-22 LAB — URINE CULTURE

## 2022-07-22 LAB — MICROSCOPIC EXAMINATION
Bacteria, UA: NONE SEEN
Cast Type: NONE SEEN
Casts: NONE SEEN /lpf
Crystal Type: NONE SEEN
Crystals: NONE SEEN
Epithelial Cells (non renal): NONE SEEN /hpf (ref 0–10)
RBC, Urine: NONE SEEN /hpf (ref 0–2)
Renal Epithel, UA: NONE SEEN /hpf
Trichomonas, UA: NONE SEEN
Yeast, UA: NONE SEEN

## 2022-07-22 LAB — URINALYSIS, ROUTINE W REFLEX MICROSCOPIC
Bilirubin, UA: NEGATIVE
Glucose, UA: NEGATIVE
Ketones, UA: NEGATIVE
Nitrite, UA: NEGATIVE
Protein,UA: NEGATIVE
RBC, UA: NEGATIVE
Specific Gravity, UA: 1.01 (ref 1.005–1.030)
Urobilinogen, Ur: 0.2 mg/dL (ref 0.2–1.0)
pH, UA: 6 (ref 5.0–7.5)

## 2022-07-22 MED ORDER — CEFTRIAXONE SODIUM 1 G IJ SOLR
1.0000 g | Freq: Once | INTRAMUSCULAR | Status: AC
  Administered 2022-07-22: 1 g via INTRAMUSCULAR

## 2022-07-22 NOTE — Progress Notes (Signed)
Assessment: 1. Elevated PSA   2. Malignant neoplasm of urinary bladder, unspecified site ([place])   3. Nodular prostate     Plan: FU prostate path Patient will return in 1 week for cysto and path results   Chief Complaint: Here today for scheduled TRUS/BX  HPI: Timothy [name] is a 67 y.o. male who presents for continued evaluation of elevated psa/abnl  DRE and also h/o low risk NMIBC that has been lost to FU. Scheduled for trus/bx today   Portions of the above documentation were copied from a prior visit for review purposes only.  Allergies: Allergies  Allergen Reactions   Ace Inhibitors Other (See Comments)    Caused wheezing   Aspirin Swelling and Other (See Comments)    Lips swell- still takes approx 2 times a week in 2021   Ibuprofen Swelling and Other (See Comments)    Lips swell   Losartan Other (See Comments)    Leg cramps    PMH: Past Medical History:  Diagnosis Date   Altered mental status, improved at discharge 06/19/2013   Bronchitis, chronic ([place]) 06/19/2013   CAD (coronary artery disease)    a. s/p cardiac arrest in 06/2013 with DES to RCA b. 02/2020: Inferior STEMI and cath showing total occlusion of mid-RCA treated with DES placement.    Cancer [facility])    bladder   Cardiac arrest, hypothermic protocol 06/12/2013   COPD exacerbation ([place]) 06/12/2013   GERD (gastroesophageal reflux disease)    Hyperlipidemia LDL goal < 70 06/19/2013   Hypokalemia, replaced 06/19/2013   Hypomagnesemia, replaced 06/19/2013   Hypotension, requiring levophed 06/19/2013   NSTEMI (non-ST elevated myocardial infarction), DES to RCA 06/13/2013   Pre-diabetes    Tobacco abuse 06/19/2013    PSH: Past Surgical History:  Procedure Laterality Date   cardiac sstent     CORONARY ANGIOPLASTY WITH STENT PLACEMENT  06/12/13   Xience Alpine DES to RCA, NSTEMI   CORONARY STENT INTERVENTION N/A 02/24/2020   Procedure: CORONARY STENT INTERVENTION;  Surgeon: Belva Crome, MD;  Location: [place] [facility];  Service: Cardiovascular;  Laterality: N/A;   CORONARY/GRAFT ACUTE MI REVASCULARIZATION N/A 02/24/2020   Procedure: Coronary/Graft Acute MI Revascularization;  Surgeon: Belva Crome, MD;  Location: [place] [facility];  Service: Cardiovascular;  Laterality: N/A;   CYSTOSCOPY W/ RETROGRADES Left 03/29/2017   Procedure: CYSTOSCOPY WITH LEFT RETROGRADE PYELOGRAM;  Surgeon: Kathie Rhodes, MD;  Location: [facility];  Service: Urology;  Laterality: Left;   LEFT HEART CATH AND CORONARY ANGIOGRAPHY N/A 02/24/2020   Procedure: LEFT HEART CATH AND CORONARY ANGIOGRAPHY;  Surgeon: Belva Crome, MD;  Location: [place] [facility];  Service: Cardiovascular;  Laterality: N/A;   LEFT HEART CATHETERIZATION WITH CORONARY ANGIOGRAM N/A 06/12/2013   Procedure: LEFT HEART CATHETERIZATION WITH CORONARY ANGIOGRAM;  Surgeon: Troy Sine, MD;  Location: [facility] CATH LAB;  Service: Cardiovascular;  Laterality: N/A;   NO PAST SURGERIES     TRANSURETHRAL RESECTION OF BLADDER TUMOR N/A 03/29/2017   Procedure: TRANSURETHRAL RESECTION OF BLADDER TUMOR (TURBT)/;  Surgeon: Kathie Rhodes, MD;  Location: [facility];  Service: Urology;  Laterality: N/A;   transurethral ressection of bladder tumor     Dr. Karsten Ro 03/29/17    SH: Social History   Tobacco Use   Smoking status: Former    Packs/day: 2.00    Years: 38.00    Additional pack years: 0.00    Total pack years: 76.00    Types: Cigarettes  Quit date: 07/10/2013    Years since quitting: 9.0   Smokeless tobacco: Never  Vaping Use   Vaping Use: Never used  Substance Use Topics   Alcohol use: No   Drug use: No    Types: Marijuana    ROS: Constitutional:  Negative for fever, chills, weight loss CV: Negative for chest pain, previous MI, hypertension Respiratory:  Negative for shortness of breath, wheezing, sleep apnea, frequent cough GI:  Negative for nausea, vomiting, bloody stool, GERD  PE: BP 137/86   Pulse 91  GENERAL APPEARANCE:  Well appearing,  well developed, well nourished, NAD    PROCEDURE NOTE:  TRANSRECTAL ULTRASOUND AND PROSTATE BIOPSY  Indication:  Elevated PSA  Prophylactic antibiotic administration: Rocephin  All medications that could result in increased bleeding were discontinued within an appropriate period of the time of biopsy.  Risk including bleeding and infection were discussed.  Informed consent was obtained.  The patient was placed in the left lateral decubitus position.  DRE:  NST, PROSTATE 40GM and generally firm throughout.  Not as suspicious as I documented on prior exam.  Slight increased nodularity right lateral base.  PROCEDURE 1.  TRANSRECTAL ULTRASOUND OF THE PROSTATE  The 7 MHz transrectal probe was used to image the prostate.  Anal stenosis was not noted.  TRUS volume: 35.7 ml  Hypoechoic areas: Bilateral and base  Hyperechoic areas: None  Central calcifications: present and EXTENSIVE!  Margins:  normal  Seminal Vesicles: normal   PROCEDURE 2:  PROSTATE BIOPSY  A periprostatic block was performed using 1% lidocaine and transrectal ultrasound guidance. Under transrectal ultrasound guidance, and using the Biopty gun, prostate biopsies were obtained systematically from the apex, mid gland, and base bilaterally.  A total of 12 cores were obtained.  Hemostasis was obtained with gentle pressure on the prostate.  The procedures were well-tolerated.  No significant bleeding was noted at the end of the procedure.  The patient was stable for discharge from the office.

## 2022-07-22 NOTE — Addendum Note (Signed)
Addended by: Dema Severin on: 07/22/2022 03:16 PM   Modules accepted: Orders

## 2022-07-26 NOTE — Progress Notes (Signed)
[facility] [phone]   [phone]  INITIAL CONSULT NOTE  Patient Care Team: Marcine Matar, MD as PCP - General (Internal Medicine) Lennette Bihari, MD as PCP - Cardiology (Cardiology)  Hematological/Oncological History # Leukocytosis, Neutrophilic Predominance 06/25/2022: WBC 14.5, Hgb 16.9, MCV 50.6, Plt 436 07/27/2022: establish care with Dr. Leonides Schanz   CHIEF COMPLAINTS/PURPOSE OF CONSULTATION:  "Leukocytosis"  HISTORY OF PRESENTING ILLNESS:  Timothy [name] 67 y.o. male with medical history significant for COPD, GERD, HLD, NSTEMI, and tobacco use who presents for evaluation of leukocytosis.   On review of the previous records Timothy [name] had labs collected on 06/25/2022 which showed white blood cell count 14.5, hemoglobin 16.9, MCV 50.6, and platelets of 436.  White blood cells were neutrophilic predominance.  Due to concern for this finding the patient was referred to hematology for further evaluation and management.  On exam today Timothy [name] reports that he has had numerous episodes of elevated white blood cells.  He reports that just in the last 2 weeks he had a urinary tract infection and underwent a prostate biopsy.  He reports that he is currently scheduled to see the urologist again for a bladder check and results of his prostate biopsy.  He reports he has not had any other recent infectious symptoms such as sinus infection, pneumonia, or rash.  He reports that he does not have any underlying inflammatory conditions such as rheumatoid arthritis, psoriasis, or lupus.  He reports that he is a former smoker having quit 10 years ago.  He does carry diagnosis of COPD and did smoke for 38 years.  He notes he does not have any issue with snoring at home and actually uses oxygen at home at night.  He reports that he did use to have some occasional difficulty breathing at night but that has resolved with the oxygen supplementation.  He does use COPD  inhalers which do contain steroids.  On further discussion he reports his mother had cancer of some kind but he is unsure what kind.  He did not know his father well when he passed away.  He reports he has 2 children, 1 of whom passed away due to a drug overdose.  He reports he does not drink any alcohol and is currently a retired Curator.  He notes otherwise he is not having any symptoms of the ordinary.  He denies any fevers, chills, sweats, nausea, vomiting or diarrhea.  A full 10 point ROS is otherwise negative.  MEDICAL HISTORY:  Past Medical History:  Diagnosis Date   Altered mental status, improved at discharge 06/19/2013   Bronchitis, chronic ([facility]) 06/19/2013   CAD (coronary artery disease)    a. s/p cardiac arrest in 06/2013 with DES to RCA b. 02/2020: Inferior STEMI and cath showing total occlusion of mid-RCA treated with DES placement.    Cancer [facility])    bladder   Cardiac arrest, hypothermic protocol 06/12/2013   COPD exacerbation ([facility]) 06/12/2013   GERD (gastroesophageal reflux disease)    Hyperlipidemia LDL goal < 70 06/19/2013   Hypokalemia, replaced 06/19/2013   Hypomagnesemia, replaced 06/19/2013   Hypotension, requiring levophed 06/19/2013   NSTEMI (non-ST elevated myocardial infarction), DES to RCA 06/13/2013   Pre-diabetes    Tobacco abuse 06/19/2013    SURGICAL HISTORY: Past Surgical History:  Procedure Laterality Date   cardiac sstent     CORONARY ANGIOPLASTY WITH STENT PLACEMENT  06/12/13   Xience Alpine DES to RCA, NSTEMI  CORONARY STENT INTERVENTION N/A 02/24/2020   Procedure: CORONARY STENT INTERVENTION;  Surgeon: Lyn Records, MD;  [facility]VAS[facility] LAB;  Service: Cardiovascular;  Laterality: N/A;   CORONARY/GRAFT ACUTE MI REVASCULARIZATION N/A 02/24/2020   Procedure: Coronary/Graft Acute MI Revascularization;  Surgeon: Lyn Records, MD;  Location: [facility] INVASIVE CV LAB;  Service: Cardiovascular;  Laterality: N/A;   CYSTOSCOPY W/ RETROGRADES Left 03/29/2017    Procedure: CYSTOSCOPY WITH LEFT RETROGRADE PYELOGRAM;  Surgeon: Ihor Gully, MD;  [facility];  Service: Urology;  Laterality: Left;   LEFT HEART CATH AND CORONARY ANGIOGRAPHY N/A 02/24/2020   Procedure: LEFT HEART CATH AND CORONARY ANGIOGRAPHY;  Surgeon: Lyn Records, MD;  Location: [facility] INVASIVE CV LAB;  Service: Cardiovascular;  Laterality: N/A;   LEFT HEART CATHETERIZATION WITH CORONARY ANGIOGRAM N/A 06/12/2013   Procedure: LEFT HEART CATHETERIZATION WITH CORONARY ANGIOGRAM;  Surgeon: Lennette Bihari, MD;  Location: [facility] CATH LAB;  Service: Cardiovascular;  Laterality: N/A;   NO PAST SURGERIES     TRANSURETHRAL RESECTION OF BLADDER TUMOR N/A 03/29/2017   Procedure: TRANSURETHRAL RESECTION OF BLADDER TUMOR (TURBT)/;  Surgeon: Ihor Gully, MD;  Location: [facility];  Service: Urology;  Laterality: N/A;   transurethral ressection of bladder tumor     Dr. Vernie Ammons 03/29/17    SOCIAL HISTORY: Social History   Socioeconomic History   Marital status: Divorced    Spouse name: Not on file   Number of children: Not on file   Years of education: Not on file   Highest education level: Not on file  Occupational History   Not on file  Tobacco Use   Smoking status: Former    Packs/day: 2.00    Years: 38.00    Additional pack years: 0.00    Total pack years: 76.00    Types: Cigarettes    Quit date: 07/10/2013    Years since quitting: 9.0   Smokeless tobacco: Never  Vaping Use   Vaping Use: Never used  Substance and Sexual Activity   Alcohol use: No   Drug use: No    Types: Marijuana   Sexual activity: Yes  Other Topics Concern   Not on file  Social History Narrative   Not on file   Social Determinants of Health   Financial Resource Strain: Low Risk  (05/28/2022)   Overall Financial Resource Strain (CARDIA)    Difficulty of Paying Living Expenses: Not hard at all  Food Insecurity: No Food Insecurity (05/28/2022)   Hunger Vital Sign    Worried About Running Out of Food in the Last  Year: Never true    Ran Out of Food in the Last Year: Never true  Transportation Needs: No Transportation Needs (05/28/2022)   PRAPARE - Administrator, Civil Service (Medical): No    Lack of Transportation (Non-Medical): No  Physical Activity: Inactive (05/28/2022)   Exercise Vital Sign    Days of Exercise per Week: 0 days    Minutes of Exercise per Session: 0 min  Stress: No Stress Concern Present (05/28/2022)   [employer] of Occupational Health - Occupational Stress Questionnaire    Feeling of Stress : Not at all  Social Connections: Not on file  Intimate Partner Violence: Not on file    FAMILY HISTORY: Family History  Problem Relation Age of Onset   Hypertension Other     ALLERGIES:  is allergic to ace inhibitors, aspirin, ibuprofen, and losartan.  MEDICATIONS:  Current Outpatient Medications  Medication Sig Dispense Refill  acetaminophen (TYLENOL) 500 MG tablet Take 500-1,000 mg by mouth every 8 (eight) hours as needed for mild pain or headache.      albuterol (VENTOLIN HFA) 108 (90 Base) MCG/ACT inhaler Inhale 2 puffs into the lungs every 6 (six) hours as needed for wheezing or shortness of breath. 8 g 5   amLODipine (NORVASC) 5 MG tablet Take 1 tablet (5 mg total) by mouth daily. 90 tablet 1   aspirin EC 81 MG tablet Take 1 tablet (81 mg total) by mouth daily. 90 tablet 2   atorvastatin (LIPITOR) 80 MG tablet Take 1 tablet (80 mg total) by mouth at bedtime. 90 tablet 3   bisoprolol (ZEBETA) 5 MG tablet TAKE 1/2 TABLET(2.5 MG) BY MOUTH AT BEDTIME 45 tablet 2   BRILINTA 90 MG TABS tablet TAKE 1 TABLET(90 MG) BY MOUTH TWICE DAILY 180 tablet 3   Budeson-Glycopyrrol-Formoterol (BREZTRI AEROSPHERE) 160-9-4.8 MCG/ACT AERO Inhale 2 puffs into the lungs 2 (two) times daily. 10.7 g 5   famotidine (PEPCID) 20 MG tablet Take 20 mg by mouth daily as needed for heartburn or indigestion.     nitroGLYCERIN (NITROSTAT) 0.4 MG SL tablet Place 1 tablet (0.4 mg total) under  the tongue every 5 (five) minutes as needed for chest pain. NEED OV. (Patient not taking: Reported on 07/27/2022) 25 tablet 2   No current facility-administered medications for this visit.    REVIEW OF SYSTEMS:   Constitutional: ( - ) fevers, ( - )  chills , ( - ) night sweats Eyes: ( - ) blurriness of vision, ( - ) double vision, ( - ) watery eyes Ears, nose, mouth, throat, and face: ( - ) mucositis, ( - ) sore throat Respiratory: ( - ) cough, ( - ) dyspnea, ( - ) wheezes Cardiovascular: ( - ) palpitation, ( - ) chest discomfort, ( - ) lower extremity swelling Gastrointestinal:  ( - ) nausea, ( - ) heartburn, ( - ) change in bowel habits Skin: ( - ) abnormal skin rashes Lymphatics: ( - ) new lymphadenopathy, ( - ) easy bruising Neurological: ( - ) numbness, ( - ) tingling, ( - ) new weaknesses Behavioral/Psych: ( - ) mood change, ( - ) new changes  All other systems were reviewed with the patient and are negative.  PHYSICAL EXAMINATION:  Vitals:   07/27/22 1327  BP: 139/83  Pulse: 70  Resp: 14  Temp: 98.2 F (36.8 C)  SpO2: 96%   Filed Weights   07/27/22 1327  Weight: 123 lb 4.8 oz (55.9 kg)    GENERAL: well appearing middle-aged Caucasian male in NAD  SKIN: skin color, texture, turgor are normal, no rashes or significant lesions EYES: conjunctiva are pink and non-injected, sclera clear LUNGS: clear to auscultation and percussion with normal breathing effort HEART: regular rate & rhythm and no murmurs and no lower extremity edema Musculoskeletal: no cyanosis of digits and no clubbing  PSYCH: alert & oriented x 3, fluent speech NEURO: no focal motor/sensory deficits  LABORATORY DATA:  I have reviewed the data as listed    Latest Ref Rng & Units 06/25/2022   12:32 PM 04/21/2021   11:40 AM 04/07/2021   10:41 AM  CBC  WBC 3.4 - 10.8 x10E3/uL 14.5  12.3  17.7   Hemoglobin 13.0 - 17.7 g/dL 01.0  27.2  53.6   Hematocrit 37.5 - 51.0 % 50.6  45.7  49.3   Platelets 150 - 450  x10E3/uL 436   383  Latest Ref Rng & Units 06/25/2022   12:32 PM 04/09/2021   12:33 AM 04/07/2021   10:41 AM  CMP  Glucose 70 - 99 mg/dL 93  409  811   BUN 8 - 27 mg/dL 10  21  21    Creatinine 0.76 - 1.27 mg/dL 9.14  7.82  9.56   Sodium 134 - 144 mmol/L 140  131  133   Potassium 3.5 - 5.2 mmol/L 4.9  4.9  4.5   Chloride 96 - 106 mmol/L 99  97  99   CO2 20 - 29 mmol/L 26  24  27    Calcium 8.6 - 10.2 mg/dL 21.3  8.8  8.9   Total Protein 6.0 - 8.5 g/dL 7.3     Total Bilirubin 0.0 - 1.2 mg/dL 0.4     Alkaline Phos 44 - 121 IU/L 89     AST 0 - 40 IU/L 16     ALT 0 - 44 IU/L 17        ASSESSMENT & PLAN Timothy [name] 67 y.o. male with medical history significant for COPD, GERD, HLD, NSTEMI, and tobacco use who presents for evaluation of leukocytosis.   After review of the labs, review of the records, and discussion with the patient the patients findings are most consistent with leukocytosis of unclear etiology.  Neutrophilic predominant leukocytosis is a common hematological finding with numerous possible etiologies. Possible causes include chronic inflammation, infection (transient or chronic), medication side effect (particularly steroids), myeloproliferative neoplasm, or idiopathic. The most common cause of chronic inflammation causing leukocytosis is cigarette smoking. Obstructive sleep apnea can also produce a similar pattern of leukocytosis. If there is no clear cause (or if there is an increase in other cell lines) a myeloproliferative neoplasm workup should be conducted with JAK2 w/ reflex and BCR/ABL FISH.  Idiopathic cases should be observed for stability.    #Leukocytosis, Neutrophilic Predominant  --today will order CBC, CMP, and LDH  --inflammatory workup with ESR and CRP  --patient is a former smoker (quit 10 years ago) and has no signs/symptoms of OSA   --no focal signs concerning for infection.   --Due to the chronic nature of this leukocytosis will order MPN workup  with JAK2 with reflex as well as BCR/ABL FISH --RTC in 6 months or sooner if indicated by above workup.    Orders Placed This Encounter  Procedures   CBC with Differential (Cancer Center Only)    Standing Status:   Future    Standing Expiration Date:   07/27/2023   CMP (Cancer Center only)    Standing Status:   Future    Standing Expiration Date:   07/27/2023   Lactate dehydrogenase (LDH)    Standing Status:   Future    Standing Expiration Date:   07/27/2023   Sedimentation rate    Standing Status:   Future    Standing Expiration Date:   07/27/2023   C-reactive protein    Standing Status:   Future    Standing Expiration Date:   07/27/2023   JAK2 (including V617F and Exon 12), MPL, and CALR-Next Generation Sequencing    Standing Status:   Future    Standing Expiration Date:   07/27/2023   BCR ABL1 FISH ([facility])    Standing Status:   Future    Standing Expiration Date:   07/27/2023    All questions were answered. The patient knows to call the clinic with any problems, questions or concerns.  A total of more  than 60 minutes were spent on this encounter with face-to-face time and non-face-to-face time, including preparing to see the patient, ordering tests and/or medications, counseling the patient and coordination of care as outlined above.   Ulysees Barns, MD Department of Hematology/Oncology [facility] Phone: [phone] Pager: [phone] Email: [email or web address]  07/27/2022 2:11 PM

## 2022-07-27 ENCOUNTER — Other Ambulatory Visit: Payer: Self-pay

## 2022-07-27 ENCOUNTER — Inpatient Hospital Stay: Payer: Medicare Other | Attending: Hematology and Oncology | Admitting: Hematology and Oncology

## 2022-07-27 ENCOUNTER — Telehealth: Payer: Self-pay | Admitting: Hematology and Oncology

## 2022-07-27 ENCOUNTER — Inpatient Hospital Stay: Payer: Medicare Other

## 2022-07-27 VITALS — BP 139/83 | HR 70 | Temp 98.2°F | Resp 14 | Ht 66.0 in | Wt 123.3 lb

## 2022-07-27 DIAGNOSIS — J449 Chronic obstructive pulmonary disease, unspecified: Secondary | ICD-10-CM | POA: Insufficient documentation

## 2022-07-27 DIAGNOSIS — D72825 Bandemia: Secondary | ICD-10-CM | POA: Diagnosis not present

## 2022-07-27 DIAGNOSIS — D72829 Elevated white blood cell count, unspecified: Secondary | ICD-10-CM | POA: Insufficient documentation

## 2022-07-27 LAB — CMP (CANCER CENTER ONLY)
ALT: 18 U/L (ref 0–44)
AST: 15 U/L (ref 15–41)
Albumin: 4.9 g/dL (ref 3.5–5.0)
Alkaline Phosphatase: 90 U/L (ref 38–126)
Anion gap: 9 (ref 5–15)
BUN: 13 mg/dL (ref 8–23)
CO2: 28 mmol/L (ref 22–32)
Calcium: 10.4 mg/dL — ABNORMAL HIGH (ref 8.9–10.3)
Chloride: 101 mmol/L (ref 98–111)
Creatinine: 0.85 mg/dL (ref 0.61–1.24)
GFR, Estimated: >60 mL/min (ref >60)
Glucose, Bld: 100 mg/dL — ABNORMAL HIGH (ref 70–99)
Potassium: 4.4 mmol/L (ref 3.5–5.1)
Sodium: 138 mmol/L (ref 135–145)
Total Bilirubin: 0.8 mg/dL (ref 0.3–1.2)
Total Protein: 8.1 g/dL (ref 6.5–8.1)

## 2022-07-27 LAB — CBC WITH DIFFERENTIAL (CANCER CENTER ONLY)
Abs Immature Granulocytes: 0.17 10*3/uL — ABNORMAL HIGH (ref 0.00–0.07)
Basophils Absolute: 0.1 10*3/uL (ref 0.0–0.1)
Basophils Relative: 1 %
Eosinophils Absolute: 0.3 10*3/uL (ref 0.0–0.5)
Eosinophils Relative: 2 %
HCT: 50.2 % (ref 39.0–52.0)
Hemoglobin: 17 g/dL (ref 13.0–17.0)
Immature Granulocytes: 1 %
Lymphocytes Relative: 16 %
Lymphs Abs: 2.4 10*3/uL (ref 0.7–4.0)
MCH: 30.5 pg (ref 26.0–34.0)
MCHC: 33.9 g/dL (ref 30.0–36.0)
MCV: 90.1 fL (ref 80.0–100.0)
Monocytes Absolute: 1.7 10*3/uL — ABNORMAL HIGH (ref 0.1–1.0)
Monocytes Relative: 11 %
Neutro Abs: 10.3 10*3/uL — ABNORMAL HIGH (ref 1.7–7.7)
Neutrophils Relative %: 69 %
Platelet Count: 411 10*3/uL — ABNORMAL HIGH (ref 150–400)
RBC: 5.57 MIL/uL (ref 4.22–5.81)
RDW: 12.9 % (ref 11.5–15.5)
WBC Count: 14.9 10*3/uL — ABNORMAL HIGH (ref 4.0–10.5)
nRBC: 0 % (ref 0.0–0.2)

## 2022-07-27 LAB — LACTATE DEHYDROGENASE: LDH: 156 U/L (ref 98–192)

## 2022-07-27 LAB — C-REACTIVE PROTEIN: CRP: 0.6 mg/dL (ref <1.0)

## 2022-07-27 LAB — SEDIMENTATION RATE: Sed Rate: 6 mm/hr (ref 0–16)

## 2022-07-27 NOTE — Telephone Encounter (Signed)
Per 3/25  LOS reached out to patient to schedule. Patient aware of date and time of appointment.

## 2022-07-30 ENCOUNTER — Encounter: Payer: Self-pay | Admitting: Internal Medicine

## 2022-07-30 ENCOUNTER — Ambulatory Visit: Payer: Medicare Other | Admitting: Urology

## 2022-07-30 ENCOUNTER — Ambulatory Visit: Payer: Self-pay | Admitting: Urology

## 2022-07-30 VITALS — BP 147/75 | HR 75 | Ht 66.0 in | Wt 124.0 lb

## 2022-07-30 DIAGNOSIS — Z8551 Personal history of malignant neoplasm of bladder: Secondary | ICD-10-CM

## 2022-07-30 DIAGNOSIS — N99116 Postprocedural urethral stricture, male, overlapping sites: Secondary | ICD-10-CM

## 2022-07-30 DIAGNOSIS — R972 Elevated prostate specific antigen [PSA]: Secondary | ICD-10-CM

## 2022-07-30 DIAGNOSIS — C679 Malignant neoplasm of bladder, unspecified: Secondary | ICD-10-CM

## 2022-07-30 LAB — MICROSCOPIC EXAMINATION
Bacteria, UA: NONE SEEN
Cast Type: NONE SEEN
Casts: NONE SEEN /lpf
Crystal Type: NONE SEEN
Crystals: NONE SEEN
Epithelial Cells (non renal): NONE SEEN /hpf (ref 0–10)
Mucus, UA: NONE SEEN
Renal Epithel, UA: NONE SEEN /hpf
Trichomonas, UA: NONE SEEN
Yeast, UA: NONE SEEN

## 2022-07-30 LAB — URINALYSIS, ROUTINE W REFLEX MICROSCOPIC
Bilirubin, UA: NEGATIVE
Glucose, UA: NEGATIVE
Ketones, UA: NEGATIVE
Nitrite, UA: NEGATIVE
Protein,UA: NEGATIVE
Specific Gravity, UA: 1.015 (ref 1.005–1.030)
Urobilinogen, Ur: 0.2 mg/dL (ref 0.2–1.0)
pH, UA: 7 (ref 5.0–7.5)

## 2022-07-30 MED ORDER — FINASTERIDE 5 MG PO TABS: 5.0000 mg | ORAL_TABLET | Freq: Every day | ORAL | 3 refills | Status: DC

## 2022-07-30 MED ORDER — SULFAMETHOXAZOLE-TRIMETHOPRIM 800-160 MG PO TABS
1.0000 | ORAL_TABLET | Freq: Once | ORAL | Status: AC
  Administered 2022-07-30: 1 via ORAL

## 2022-07-30 NOTE — Progress Notes (Signed)
Assessment: 1. Malignant neoplasm of urinary bladder, unspecified site (HCC)   2. Elevated PSA   3. Postprocedural stricture of overlapping sites of urethra in male     Plan: Today had a long discussion with the patient regarding his urologic issues.  Given his urethral stricture I was unable to perform surveillance cystoscopy.  I recommend to proceed with cystoscopy in the operating room with urethral dilation/DVIU.  Nature procedure reviewed in detail including potential adverse events and complications.  Patient agrees to proceed.  Also discussed with the patient his elevated PSA and BPH.  We discussed medical management options.  Following our discussion he would like to begin new medical therapy. Rx: Finasteride 5 mg daily Nature of medication including proper utilization as well as potential adverse events and side effects reviewed.  Chief Complaint: Bladder cancer, urethral stricture  HPI: Timothy [name] is a 67 y.o. male who presents for continued evaluation of elevated PSA and history of low risk nonmuscle invasive bladder cancer. Patient is now approximately 1 week status post TRUS/BX which was negative for tumor.  He reports no complications as a result of his biopsy.  Please see my note 07/02/2022 at the time of initial visit for detailed history and exam. Patient had screening psa 06/2022= 6.8 Status post TRUS/BX 07/2022--pathology benign; prostate volume 36 mL No known prior PSA testing. No fam hx of CaP Patient denies significant LUTS.   Patient also has a history of low risk nonmuscle invasive bladder cancer. Status post TURBT 03/2017-low-grade TA.  Patient did not receive perioperative chemo due to bladder perforation.  Patient has not had routine follow-up// last cystoscopy 10/2018 in [place] Dr. Karsten Ro negative.  Portions of the above documentation were copied from a prior visit for review purposes only.  Allergies: Allergies  Allergen Reactions   Ace  Inhibitors Other (See Comments)    Caused wheezing   Aspirin Swelling and Other (See Comments)    Lips swell- still takes approx 2 times a week in 2021   Ibuprofen Swelling and Other (See Comments)    Lips swell   Losartan Other (See Comments)    Leg cramps    PMH: Past Medical History:  Diagnosis Date   Altered mental status, improved at discharge 06/19/2013   Bronchitis, chronic ([place]) 06/19/2013   CAD (coronary artery disease)    a. s/p cardiac arrest in 06/2013 with DES to RCA b. 02/2020: Inferior STEMI and cath showing total occlusion of mid-RCA treated with DES placement.    Cancer [facility])    bladder   Cardiac arrest, hypothermic protocol 06/12/2013   COPD exacerbation ([place]) 06/12/2013   GERD (gastroesophageal reflux disease)    Hyperlipidemia LDL goal < 70 06/19/2013   Hypokalemia, replaced 06/19/2013   Hypomagnesemia, replaced 06/19/2013   Hypotension, requiring levophed 06/19/2013   NSTEMI (non-ST elevated myocardial infarction), DES to RCA 06/13/2013   Pre-diabetes    Tobacco abuse 06/19/2013    PSH: Past Surgical History:  Procedure Laterality Date   cardiac sstent     CORONARY ANGIOPLASTY WITH STENT PLACEMENT  06/12/13   Xience Alpine DES to RCA, NSTEMI   CORONARY STENT INTERVENTION N/A 02/24/2020   Procedure: CORONARY STENT INTERVENTION;  Surgeon: Belva Crome, MD;  Location: [place] [facility];  Service: Cardiovascular;  Laterality: N/A;   CORONARY/GRAFT ACUTE MI REVASCULARIZATION N/A 02/24/2020   Procedure: Coronary/Graft Acute MI Revascularization;  Surgeon: Belva Crome, MD;  Location: [place] [facility];  Service: Cardiovascular;  Laterality: N/A;  CYSTOSCOPY W/ RETROGRADES Left 03/29/2017   Procedure: CYSTOSCOPY WITH LEFT RETROGRADE PYELOGRAM;  Surgeon: Kathie Rhodes, MD;  Location: [facility];  Service: Urology;  Laterality: Left;   LEFT HEART CATH AND CORONARY ANGIOGRAPHY N/A 02/24/2020   Procedure: LEFT HEART CATH AND CORONARY ANGIOGRAPHY;  Surgeon: Belva Crome,  MD;  Location: [place] [facility];  Service: Cardiovascular;  Laterality: N/A;   LEFT HEART CATHETERIZATION WITH CORONARY ANGIOGRAM N/A 06/12/2013   Procedure: LEFT HEART CATHETERIZATION WITH CORONARY ANGIOGRAM;  Surgeon: Troy Sine, MD;  Location: [facility] C[facility];  Service: Cardiovascular;  Laterality: N/A;   NO PAST SURGERIES     TRANSURETHRAL RESECTION OF BLADDER TUMOR N/A 03/29/2017   Procedure: TRANSURETHRAL RESECTION OF BLADDER TUMOR (TURBT)/;  Surgeon: Kathie Rhodes, MD;  Location: [facility];  Service: Urology;  Laterality: N/A;   transurethral ressection of bladder tumor     Dr. Karsten Ro 03/29/17    SH: Social History   Tobacco Use   Smoking status: Former    Packs/day: 2.00    Years: 38.00    Additional pack years: 0.00    Total pack years: 76.00    Types: Cigarettes    Quit date: 07/10/2013    Years since quitting: 9.0   Smokeless tobacco: Never  Vaping Use   Vaping Use: Never used  Substance Use Topics   Alcohol use: No   Drug use: No    Types: Marijuana    ROS: Constitutional:  Negative for fever, chills, weight loss CV: Negative for chest pain, previous MI, hypertension Respiratory:  Negative for shortness of breath, wheezing, sleep apnea, frequent cough GI:  Negative for nausea, vomiting, bloody stool, GERD  PE: BP (!) 147/75   Pulse 75   Ht 5\' 6"  (1.676 m)   Wt 124 lb (56.2 kg)   BMI 20.01 kg/m  GENERAL APPEARANCE:  Well appearing, well developed, well nourished, NAD HEENT:  Atraumatic, normocephalic COR:  RR LUNGS:  CTA ABDOMEN:  Soft, non-tender, no masses EXTREMITIES:  Moves all extremities well NEUROLOGIC:  Alert and oriented x 3 MENTAL STATUS:  appropriate SKIN:  Warm, dry, and intact   Procedure: Flexible cystoscopy  Indication: History of bladder cancer  Description of procedure: Following the attainment of informed consent the patient was positioned in the supine position and prepped and draped in the usual sterile fashion 2% lidocaine jelly  was inserted per urethra for local analgesia.  Flexible cystoscopy was subsequently performed.   Anterior urethra: Fairly tight distal penile urethral stricture-could not pass scope further proximally  Posterior urethra: N/A  Bladder: N/A  Procedure was well-tolerated.  No complications.

## 2022-08-03 LAB — BCR ABL1 FISH (GENPATH)

## 2022-08-03 LAB — JAK2 (INCLUDING V617F AND EXON 12), MPL,& CALR-NEXT GEN SEQ

## 2022-08-05 ENCOUNTER — Telehealth: Payer: Self-pay | Admitting: Urology

## 2022-08-05 ENCOUNTER — Telehealth: Payer: Self-pay | Admitting: *Deleted

## 2022-08-05 NOTE — Telephone Encounter (Signed)
Spoke with patient who wants to reschedule the surgery until a couple weeks after his colonoscopy. Surgery has been rescheduled to 09/15/22 at 1p.

## 2022-08-05 NOTE — Telephone Encounter (Signed)
   Pre-operative Risk Assessment    Patient Name: Timothy [name]  DOB: 1955-06-10 MRN: [id number]      Request for Surgical Clearance    Procedure:   CYSTOSCOPY WITH BALLOON DILATION OF A URETHRAL STRICTURE, POSSIBLE DVIU, POSSIBLE TURBT  Date of Surgery:  Clearance 09/15/22                                 Surgeon:  DR. Jonette Eva Surgeon's Group or Practice Name:  [facility]  Phone number:  [phone]; Loann QuillJacqulyn Bath, [place] Fax number:  [phone]   Type of Clearance Requested:   - Medical  - Pharmacy:  Hold Ticagrelor (Brilinta) x 7 DAYS PRIOR   Type of Anesthesia:  General    Additional requests/questions:    Jiles Prows   08/05/2022, 5:25 PM

## 2022-08-05 NOTE — Telephone Encounter (Signed)
Patient called in regards to a surgery that was scheduled for 08/25/2022 and he stated that date is not going to work and that he is scheduled for a colonoscopy on 08/27/2022 and he was highly upset the surgery was just scheduled without confirming with him a date, patient would like a call back in regards to this.   Patients callback #: [phone]

## 2022-08-06 ENCOUNTER — Telehealth: Payer: Self-pay

## 2022-08-06 ENCOUNTER — Encounter: Payer: Self-pay | Admitting: Internal Medicine

## 2022-08-06 NOTE — Telephone Encounter (Signed)
Pt scheduled to see Dr. Claiborne Billings, 08/20/22.  Clearance will be addressed at that time.  Will route to requesting surgeon's office to make them aware.

## 2022-08-06 NOTE — Telephone Encounter (Signed)
Attempted to reach pt about his blood thinner. Pt scheduled as direct despite anticoagulation status. Per protocol pt to have an OV within a year if on a blood thinner. If the patient returns the call please have him scheduled for a OV and cancel his scheduled PV and colonoscopy.

## 2022-08-06 NOTE — Telephone Encounter (Signed)
    Primary Cardiologist:Thomas Claiborne Billings, MD  Chart reviewed as part of pre-operative protocol coverage. Because of Timothy [name]'s past medical history and time since last visit, he/she will require a follow-up Office visit in order to better assess preoperative cardiovascular risk.  Pre-op covering staff: - Please schedule appointment and call patient to inform them. - Please contact requesting surgeon's office via preferred method (i.e, phone, fax) to inform them of need for appointment prior to surgery.  If applicable, this message will also be routed to pharmacy pool and/or primary cardiologist for input on holding anticoagulant/antiplatelet agent as requested below so that this information is available at time of patient's appointment.   Deberah Pelton, NP  08/06/2022, 7:08 AM

## 2022-08-06 NOTE — Telephone Encounter (Signed)
1st attempt to reach pt regarding surgical clearance and the need for an in-office appointment. Left pt a message to call back and get that scheduled. 

## 2022-08-20 ENCOUNTER — Ambulatory Visit: Payer: Medicare Other | Admitting: Cardiovascular Disease

## 2022-08-25 ENCOUNTER — Encounter: Payer: Self-pay | Admitting: Physician Assistant

## 2022-08-25 ENCOUNTER — Ambulatory Visit: Payer: Medicare Other | Attending: Cardiovascular Disease | Admitting: Physician Assistant

## 2022-08-25 VITALS — BP 124/66 | HR 86 | Ht 66.0 in | Wt 121.0 lb

## 2022-08-25 DIAGNOSIS — J439 Emphysema, unspecified: Secondary | ICD-10-CM | POA: Diagnosis not present

## 2022-08-25 DIAGNOSIS — Z01818 Encounter for other preprocedural examination: Secondary | ICD-10-CM

## 2022-08-25 DIAGNOSIS — I2510 Atherosclerotic heart disease of native coronary artery without angina pectoris: Secondary | ICD-10-CM | POA: Diagnosis not present

## 2022-08-25 DIAGNOSIS — E785 Hyperlipidemia, unspecified: Secondary | ICD-10-CM

## 2022-08-25 MED ORDER — TICAGRELOR 90 MG PO TABS: ORAL_TABLET | ORAL | 3 refills | Status: DC

## 2022-08-25 NOTE — Progress Notes (Unsigned)
Cardiology Office Note:    Date:  08/27/2022   ID:  Timothy [name], DOB November 08, 1955, MRN [id number]  PCP:  Marcine Matar, MD   [facility] Providers Cardiologist:  Nicki Guadalajara, MD     Referring MD: Marcine Matar, MD   Chief Complaint  Patient presents with   Follow-up    Seen for Dr. Tresa Endo    History of Present Illness:    Timothy [name] is a 67 y.o. male with a hx of cardiac arrest in 2015 with PCI to RCA, COPD followed by Dr. Sherene Sires, hyperlipidemia, prediabetes and former tobacco abuse.  Myoview obtained in October 2018 showed EF 59%, normal perfusion.  He was off of medication for some time before he had STEMI in 02/2020 and found to have total mid RCA occlusion treated with overlapping resolute stents.  He is on aspirin and Brilinta long-term.  Patient was last seen by Juanda Crumble in April 2023 at which time he continued to have chronic dyspnea on exertion secondary to COPD.  Patient presents today for preop clearance.  He is planning to undergo cystoscopy with balloon dilatation of the urethral stricture, possible DVIU and possible TURBT.  Patient presents today for follow-up.  He denies any chest pain, his breathing has been getting worse over the years.  He uses oxygen at night but does not require oxygen during the day.  He needed close outpatient follow-up with pulmonology service.  His previous anginal symptom was profuse sweating, worsening shortness of breath and chest discomfort.  He denies any recent chest discomfort lately.  His EKG shows no ischemic changes.  I discussed his case with DOD Dr. Rennis Golden, he is cleared for upcoming urology procedure.  He may hold Brilinta for 7 days prior to the urology procedure and restart as soon as possible afterward at the surgeon's discretion.  He also expects colonoscopy procedure due to positive Cologuard, he is cleared to proceed from the cardiac perspective as well.  He may hold Brilinta for 5 days prior to the GI  procedure and restart as soon as possible afterward at the GI physician's discretion.   Past Medical History:  Diagnosis Date   Altered mental status, improved at discharge 06/19/2013   Bronchitis, chronic (HCC) 06/19/2013   CAD (coronary artery disease)    a. s/p cardiac arrest in 06/2013 with DES to RCA b. 02/2020: Inferior STEMI and cath showing total occlusion of mid-RCA treated with DES placement.    Cancer [facility])    bladder   Cardiac arrest, hypothermic protocol 06/12/2013   COPD exacerbation (HCC) 06/12/2013   GERD (gastroesophageal reflux disease)    Hyperlipidemia LDL goal < 70 06/19/2013   Hypokalemia, replaced 06/19/2013   Hypomagnesemia, replaced 06/19/2013   Hypotension, requiring levophed 06/19/2013   NSTEMI (non-ST elevated myocardial infarction), DES to RCA 06/13/2013   Pre-diabetes    Tobacco abuse 06/19/2013    Past Surgical History:  Procedure Laterality Date   cardiac sstent     CORONARY ANGIOPLASTY WITH STENT PLACEMENT  06/12/13   Xience Alpine DES to RCA, NSTEMI   CORONARY STENT INTERVENTION N/A 02/24/2020   Procedure: CORONARY STENT INTERVENTION;  Surgeon: Lyn Records, MD;  Location: [facility];  Service: Cardiovascular;  Laterality: N/A;   CORONARY/GRAFT ACUTE MI REVASCULARIZATION N/A 02/24/2020   Procedure: Coronary/Graft Acute MI Revascularization;  Surgeon: Lyn Records, MD;  Location: [facility];  Service: Cardiovascular;  Laterality: N/A;   CYSTOSCOPY W/ RETROGRADES Left  03/29/2017   Procedure: CYSTOSCOPY WITH LEFT RETROGRADE PYELOGRAM;  Surgeon: Ihor Gully, MD;  Location: [facility];  Service: Urology;  Laterality: Left;   LEFT HEART CATH AND CORONARY ANGIOGRAPHY N/A 02/24/2020   Procedure: LEFT HEART CATH AND CORONARY ANGIOGRAPHY;  Surgeon: Lyn Records, MD;  Location: [facility];  Service: Cardiovascular;  Laterality: N/A;   LEFT HEART CATHETERIZATION WITH CORONARY ANGIOGRAM N/A 06/12/2013   Procedure: LEFT HEART CATHETERIZATION WITH  CORONARY ANGIOGRAM;  Surgeon: Lennette Bihari, MD;  Location: [facility] CATH LAB;  Service: Cardiovascular;  Laterality: N/A;   NO PAST SURGERIES     TRANSURETHRAL RESECTION OF BLADDER TUMOR N/A 03/29/2017   Procedure: TRANSURETHRAL RESECTION OF BLADDER TUMOR (TURBT)/;  Surgeon: Ihor Gully, MD;  Location: [facility];  Service: Urology;  Laterality: N/A;   transurethral ressection of bladder tumor     Dr. Vernie Ammons 03/29/17    Current Medications: Current Meds  Medication Sig   acetaminophen (TYLENOL) 500 MG tablet Take 500-1,000 mg by mouth every 8 (eight) hours as needed for mild pain or headache.    albuterol (VENTOLIN HFA) 108 (90 Base) MCG/ACT inhaler Inhale 2 puffs into the lungs every 6 (six) hours as needed for wheezing or shortness of breath.   amLODipine (NORVASC) 5 MG tablet Take 1 tablet (5 mg total) by mouth daily.   aspirin EC 81 MG tablet Take 1 tablet (81 mg total) by mouth daily.   atorvastatin (LIPITOR) 80 MG tablet Take 1 tablet (80 mg total) by mouth at bedtime.   bisoprolol (ZEBETA) 5 MG tablet TAKE 1/2 TABLET(2.5 MG) BY MOUTH AT BEDTIME   Budeson-Glycopyrrol-Formoterol (BREZTRI AEROSPHERE) 160-9-4.8 MCG/ACT AERO Inhale 2 puffs into the lungs 2 (two) times daily.   famotidine (PEPCID) 20 MG tablet Take 20 mg by mouth daily as needed for heartburn or indigestion.   finasteride (PROSCAR) 5 MG tablet Take 1 tablet (5 mg total) by mouth daily.   nitroGLYCERIN (NITROSTAT) 0.4 MG SL tablet Place 1 tablet (0.4 mg total) under the tongue every 5 (five) minutes as needed for chest pain. NEED OV.   [DISCONTINUED] BRILINTA 90 MG TABS tablet TAKE 1 TABLET(90 MG) BY MOUTH TWICE DAILY     Allergies:   Ace inhibitors, Aspirin, Ibuprofen, and Losartan   Social History   Socioeconomic History   Marital status: Divorced    Spouse name: Not on file   Number of children: Not on file   Years of education: Not on file   Highest education level: Not on file  Occupational History   Not on file   Tobacco Use   Smoking status: Former    Packs/day: 2.00    Years: 38.00    Additional pack years: 0.00    Total pack years: 76.00    Types: Cigarettes    Quit date: 07/10/2013    Years since quitting: 9.1   Smokeless tobacco: Never  Vaping Use   Vaping Use: Never used  Substance and Sexual Activity   Alcohol use: No   Drug use: No    Types: Marijuana   Sexual activity: Yes  Other Topics Concern   Not on file  Social History Narrative   Not on file   Social Determinants of Health   Financial Resource Strain: Low Risk  (05/28/2022)   Overall Financial Resource Strain (CARDIA)    Difficulty of Paying Living Expenses: Not hard at all  Food Insecurity: No Food Insecurity (05/28/2022)   Hunger Vital Sign    Worried About  Running Out of Food in the Last Year: Never true    Ran Out of Food in the Last Year: Never true  Transportation Needs: No Transportation Needs (05/28/2022)   PRAPARE - Administrator, Civil Service (Medical): No    Lack of Transportation (Non-Medical): No  Physical Activity: Inactive (05/28/2022)   Exercise Vital Sign    Days of Exercise per Week: 0 days    Minutes of Exercise per Session: 0 min  Stress: No Stress Concern Present (05/28/2022)   [employer] of Occupational Health - Occupational Stress Questionnaire    Feeling of Stress : Not at all  Social Connections: Not on file     Family History: The patient's family history includes Hypertension in an other family member.  ROS:   Please see the history of present illness.     All other systems reviewed and are negative.  EKGs/Labs/Other Studies Reviewed:    The following studies were reviewed today:  Echo 02/24/2020  1. Left ventricular ejection fraction, by estimation, is 55 to 60%. The  left ventricle has normal function. The left ventricle has no regional  wall motion abnormalities. Left ventricular diastolic parameters were  normal.   2. Right ventricular systolic function  is normal. The right ventricular  size is normal. There is normal pulmonary artery systolic pressure.   3. The mitral valve is normal in structure. Trivial mitral valve  regurgitation. No evidence of mitral stenosis.   4. The aortic valve is tricuspid. There is mild calcification of the  aortic valve. Aortic valve regurgitation is not visualized. Mild aortic  valve sclerosis is present, with no evidence of aortic valve stenosis.   5. The inferior vena cava is normal in size with greater than 50%  respiratory variability, suggesting right atrial pressure of 3 mmHg.   Comparison(s): No significant change from prior study.     Cath 02/24/2020 Inferior ST elevation MI related to occlusion of overlapping stents in the mid RCA. Total mid RCA occlusion was reduced to 0% with TIMI grade III flow following dilatation and placement of an overlapping 34 x 2.75 mm Onyx postdilated to 3.0 mm at high pressure throughout the stented segment. Normal left main Normal LAD Normal circumflex Normal LV function.  LVEDP is less than 15 mmHg.  No significant regional wall motion abnormality is noted.   RECOMMENDATIONS:   Aspirin and Brilinta long-term.  After 1 year, consider down grading antiplatelet therapy to clopidogrel 75 mg/day. Risk factor modification. Serial markers. Eligible for discharge within the next 24 to 36 hours.  EKG:  EKG is ordered today.  The ekg ordered today demonstrates normal sinus rhythm, no significant ST-T wave changes.  Recent Labs: 07/27/2022: ALT 18; BUN 13; Creatinine 0.85; Hemoglobin 17.0; Platelet Count 411; Potassium 4.4; Sodium 138  Recent Lipid Panel    Component Value Date/Time   CHOL 279 (H) 06/25/2022 1232   TRIG 180 (H) 06/25/2022 1232   HDL 57 06/25/2022 1232   CHOLHDL 4.9 06/25/2022 1232   CHOLHDL 5.6 02/24/2020 1253   VLDL 42 (H) 02/24/2020 1253   LDLCALC 188 (H) 06/25/2022 1232     Risk Assessment/Calculations:           Physical Exam:    VS:   BP 124/66   Pulse 86   Ht 5\' 6"  (1.676 m)   Wt 121 lb (54.9 kg)   SpO2 91%   BMI 19.53 kg/m        Wt Readings from Last  3 Encounters:  08/25/22 121 lb (54.9 kg)  07/30/22 124 lb (56.2 kg)  07/27/22 123 lb 4.8 oz (55.9 kg)     GEN:  Well nourished, well developed in no acute distress HEENT: Normal NECK: No JVD; No carotid bruits LYMPHATICS: No lymphadenopathy CARDIAC: RRR, no murmurs, rubs, gallops RESPIRATORY:  Clear to auscultation without rales, wheezing or rhonchi  ABDOMEN: Soft, non-tender, non-distended MUSCULOSKELETAL:  No edema; No deformity  SKIN: Warm and dry NEUROLOGIC:  Alert and oriented x 3 PSYCHIATRIC:  Normal affect   ASSESSMENT:    1. Pre-procedural examination   2. Coronary artery disease involving native coronary artery of native heart without angina pectoris   3. Pulmonary emphysema, unspecified emphysema type (HCC)   4. Hyperlipidemia with target LDL less than 70    PLAN:    In order of problems listed above:  Preoperative clearance: Patient has upcoming urology procedure and also colonoscopy.  He denies any recent chest discomfort.  I discussed his case with DOD Dr. Rennis Golden, he is at acceptable risk to proceed with urology procedure by holding Brilinta for 7 days prior to the procedure.  He may also proceed with colonoscopy procedure after holding Brilinta for 5 days prior to this study and resume as soon as possible afterward at the discretion of his GI doctor.  CAD: Denies any recent chest pain.  On Lipitor and Brilinta  COPD: On oxygen at night but does not use oxygen during the day.  Followed by Dr. Sherene Sires.  Hyperlipidemia: On Lipitor.           Medication Adjustments/Labs and Tests Ordered: Current medicines are reviewed at length with the patient today.  Concerns regarding medicines are outlined above.  Orders Placed This Encounter  Procedures   EKG 12-Lead   Meds ordered this encounter  Medications   ticagrelor (BRILINTA) 90 MG TABS  tablet    Sig: TAKE 1 TABLET(90 MG) BY MOUTH TWICE DAILY    Dispense:  180 tablet    Refill:  3    Patient Instructions  Medication Instructions:  For upcoming Urology procedure HOLD Brilinta for 7 days before and RESTART as soon as possible at the surgeon's discretion  For upcoming Colonoscopy HOLD Brilinta for 5 days before procedure and RESTART as soon as possible at the surgeon's discretion   *If you need a refill on your cardiac medications before your next appointment, please call your pharmacy*  Lab Work: NONE ordered at this time of appointment   If you have labs (blood work) drawn today and your tests are completely normal, you will receive your results only by: MyChart Message (if you have MyChart) OR A paper copy in the mail If you have any lab test that is abnormal or we need to change your treatment, we will call you to review the results.  Testing/Procedures: NONE ordered at this time of appointment   Follow-Up: At [facility], you and your health needs are our priority.  As part of our continuing mission to provide you with exceptional heart care, we have created designated Provider Care Teams.  These Care Teams include your primary Cardiologist (physician) and Advanced Practice Providers (APPs -  Physician Assistants and Nurse Practitioners) who all work together to provide you with the care you need, when you need it.   Your next appointment:   6-8 month(s)  Provider:   Nicki Guadalajara, MD     Other Instructions  Ramond Dial, [place]  08/27/2022 10:41 PM     [facility]

## 2022-08-25 NOTE — Patient Instructions (Addendum)
Medication Instructions:  For upcoming Urology procedure HOLD Brilinta for 7 days before and RESTART as soon as possible at the surgeon's discretion  For upcoming Colonoscopy HOLD Brilinta for 5 days before procedure and RESTART as soon as possible at the surgeon's discretion   *If you need a refill on your cardiac medications before your next appointment, please call your pharmacy*  Lab Work: NONE ordered at this time of appointment   If you have labs (blood work) drawn today and your tests are completely normal, you will receive your results only by: MyChart Message (if you have MyChart) OR A paper copy in the mail If you have any lab test that is abnormal or we need to change your treatment, we will call you to review the results.  Testing/Procedures: NONE ordered at this time of appointment   Follow-Up: At [facility], you and your health needs are our priority.  As part of our continuing mission to provide you with exceptional heart care, we have created designated Provider Care Teams.  These Care Teams include your primary Cardiologist (physician) and Advanced Practice Providers (APPs -  Physician Assistants and Nurse Practitioners) who all work together to provide you with the care you need, when you need it.   Your next appointment:   6-8 month(s)  Provider:   Nicki Guadalajara, MD     Other Instructions

## 2022-08-25 NOTE — Telephone Encounter (Signed)
   Patient Name: Timothy [name]  DOB: 1955-07-12 MRN: [id number]  Primary Cardiologist: Nicki Guadalajara, MD  Chart reviewed as part of pre-operative protocol coverage. Given past medical history and time since last visit, based on ACC/AHA guidelines, CHETT TANIGUCHI is at acceptable risk for the planned procedure without further cardiovascular testing.  The case was discussed with DOD Dr. Rennis Golden.  Patient has no chest pain, he does have worsening shortness of breath over the years related to underlying COPD.  Today's EKG shows no acute changes.  He may proceed with urology procedure without further workup.  He should continue on the aspirin but may hold Brilinta for 7 days prior to the study and resume as soon as possible afterward at the surgeon's discretion.  The patient was advised that if he develops new symptoms prior to surgery to contact our office to arrange for a follow-up visit, and he verbalized understanding.  I will route this recommendation to the requesting party via Epic fax function and remove from pre-op pool.  Please call with questions.  [place], [place] 08/25/2022, 2:05 PM

## 2022-08-27 ENCOUNTER — Encounter: Payer: Self-pay | Admitting: Physician Assistant

## 2022-08-27 ENCOUNTER — Encounter: Payer: Medicare Other | Admitting: Internal Medicine

## 2022-09-06 NOTE — Patient Instructions (Signed)
SURGICAL WAITING ROOM VISITATION Patients having surgery or a procedure may have no more than 2 support people in the waiting area - these visitors may rotate in the visitor waiting room.   Due to an increase in RSV and influenza rates and associated hospitalizations, children ages 11 and under may not visit patients in [facility] hospitals. If the patient needs to stay at the hospital during part of their recovery, the visitor guidelines for inpatient rooms apply.  PRE-OP VISITATION  Pre-op nurse will coordinate an appropriate time for 1 support person to accompany the patient in pre-op.  This support person may not rotate.  This visitor will be contacted when the time is appropriate for the visitor to come back in the pre-op area.  Please refer to the [facility] website for the visitor guidelines for Inpatients (after your surgery is over and you are in a regular room).  You are not required to quarantine at this time prior to your surgery. However, you must do this: Hand Hygiene often Do NOT share personal items Notify your provider if you are in close contact with someone who has COVID or you develop fever 100.4 or greater, new onset of sneezing, cough, sore throat, shortness of breath or body aches.  If you test positive for Covid or have been in contact with anyone that has tested positive in the last 10 days please notify you surgeon.    Your procedure is scheduled on:  Tuesday  Sep 15, 2022  Report to [facility] Main Entrance: [facility]sen entrance where the [employer] is available.   Report to admitting at: 10:45  AM  +++++Call this number if you have any questions or problems the morning of surgery [phone]  DO NOT EAT OR DRINK ANYTHING AFTER MIDNIGHT THE NIGHT PRIOR TO YOUR SURGERY / PROCEDURE.   FOLLOW BOWEL PREP AND ANY ADDITIONAL PRE OP INSTRUCTIONS YOU RECEIVED FROM YOUR SURGEON'S OFFICE!!!   Oral Hygiene is also important to reduce your risk of infection.         Remember - BRUSH YOUR TEETH THE MORNING OF SURGERY WITH YOUR REGULAR TOOTHPASTE  Do NOT smoke after Midnight the night before surgery.  Take ONLY these medicines the morning of surgery with A SIP OF WATER: Finasteride (Proscar), amlodipine,and Famotidine if needed. You may use your Breztri and Albuterol inhalers.   You may not have any metal on your body including jewelry, and body piercing  Do not wear lotions, powders, cologne, or deodorant  Men may shave face and neck.  Contacts, Hearing Aids, dentures or bridgework may not be worn into surgery. DENTURES WILL BE REMOVED PRIOR TO SURGERY PLEASE DO NOT APPLY "Poly grip" OR ADHESIVES!!!  You may bring a small overnight bag with you on the day of surgery, only pack items that are not valuable. [facility] IS NOT RESPONSIBLE   FOR VALUABLES THAT ARE LOST OR STOLEN.   Patients discharged on the day of surgery will not be allowed to drive home.  Someone NEEDS to stay with you for the first 24 hours after anesthesia.  Do not bring your home medications to the hospital EXCEPT YOUR ALBUTEROL INHALER The Pharmacy will dispense medications listed on your medication list to you during your admission in the Hospital.  Special Instructions: Bring a copy of your healthcare power of attorney and living will documents the day of surgery, if you wish to have them scanned into your [facility] Records- EPIC  Please read over the  following fact sheets you were given: IF YOU HAVE QUESTIONS ABOUT YOUR PRE-OP INSTRUCTIONS, PLEASE CALL [phone].   [place] - Preparing for Surgery Before surgery, you can play an important role.  Because skin is not sterile, your skin needs to be as free of germs as possible.  You can reduce the number of germs on your skin by washing with CHG (chlorahexidine gluconate) soap before surgery.  CHG is an antiseptic cleaner which kills germs and bonds with the skin to continue killing germs even after  washing. Please DO NOT use if you have an allergy to CHG or antibacterial soaps.  If your skin becomes reddened/irritated stop using the CHG and inform your nurse when you arrive at Short Stay. Do not shave (including legs and underarms) for at least 48 hours prior to the first CHG shower.  You may shave your face/neck.  Please follow these instructions carefully:  1.  Shower with CHG Soap the night before surgery and the  morning of surgery.  2.  If you choose to wash your hair, wash your hair first as usual with your normal  shampoo.  3.  After you shampoo, rinse your hair and body thoroughly to remove the shampoo.                             4.  Use CHG as you would any other liquid soap.  You can apply chg directly to the skin and wash.  Gently with a scrungie or clean washcloth.  5.  Apply the CHG Soap to your body ONLY FROM THE NECK DOWN.   Do not use on face/ open                           Wound or open sores. Avoid contact with eyes, ears mouth and genitals (private parts).                       Wash face,  Genitals (private parts) with your normal soap.             6.  Wash thoroughly, paying special attention to the area where your  surgery  will be performed.  7.  Thoroughly rinse your body with warm water from the neck down.  8.  DO NOT shower/wash with your normal soap after using and rinsing off the CHG Soap.            9.  Pat yourself dry with a clean towel.            10.  Wear clean pajamas.            11.  Place clean sheets on your bed the night of your first shower and do not  sleep with pets.  ON THE DAY OF SURGERY : Do not apply any lotions/deodorants the morning of surgery.  Please wear clean clothes to the hospital/surgery center.    FAILURE TO FOLLOW THESE INSTRUCTIONS MAY RESULT IN THE CANCELLATION OF YOUR SURGERY  PATIENT SIGNATURE_________________________________  NURSE  SIGNATURE__________________________________  ________________________________________________________________________

## 2022-09-06 NOTE — Progress Notes (Signed)
COVID Vaccine received:  []  No [x]  Yes Date of any COVID positive Test in last 90 days:  PCP - Jonah Blue, MD Cardiologist - Nicki Guadalajara MD,  Azalee Course, [place] cardiac clearance 08-25-2022  Note Pulmonology- Sandrea Hughs, MD  Hematology- Jeanie Sewer IV MD  Chest x-ray - 04-02-2021 2v  Epic EKG -  08-25-2022  Epic Stress Test -  ECHO -  Cardiac Cath - 02-24-2020  LHC- DESx1 by Dr. Katrinka Blazing 2015  LHC- DES x 1 by Dr. Tresa Endo post NSTEMI  PCR screen: []  Ordered & Completed           []   No Order but Needs PROFEND           [x]   N/A for this surgery  Surgery Plan:  [x]  Ambulatory                            []  Outpatient in bed                            []  Admit  Anesthesia:    []  General  []  Spinal                           [x]   Choice []   MAC  Bowel Prep - [x]  No  []   Yes ______  Pacemaker / ICD device [x]  No []  Yes   Spinal Cord Stimulator:[x]  No []  Yes       History of Sleep Apnea? [x]  No []  Yes   CPAP used?- [x]  No []  Yes   On Home O2: 2L at night per [place]  Does the patient monitor blood sugar?          [x]  No []  Yes  []  N/A Last A1c: 04-02-2021 ? 5.9 Patient has: []  NO Hx DM   [x]  Pre-DM   (Diet only)              []  DM1  []   DM2 Does patient have a [employer] or Dexacom? []  No []  Yes   Fasting Blood Sugar Ranges-  Checks Blood Sugar _____ times a day  Blood Thinner / Instructions:Brilinta  Hold x 7 days  Aspirin Instructions:  ASA 81 mg  ERAS Protocol Ordered: [x]  No  []  Yes Patient is to be NPO after: midnight prior  Comments:   Activity level: Patient is able / unable to climb a flight of stairs without difficulty; []  No CP  []  No SOB, but would have ___   Patient can / can not perform ADLs without assistance.   Anesthesia review: HTN, CAD- NSTEMI- LHCx2 with DESx2, COPD, GERD, Pre-DM (Diet), Leukocytosis  Patient denies shortness of breath, fever, cough and chest pain at PAT appointment.  Patient verbalized understanding and agreement to the Pre-Surgical  Instructions that were given to them at this PAT appointment. Patient was also educated of the need to review these PAT instructions again prior to his surgery.I reviewed the appropriate phone numbers to call if they have any and questions or concerns.

## 2022-09-07 ENCOUNTER — Encounter (HOSPITAL_COMMUNITY): Payer: Self-pay

## 2022-09-07 ENCOUNTER — Other Ambulatory Visit: Payer: Self-pay

## 2022-09-07 ENCOUNTER — Encounter (HOSPITAL_COMMUNITY)
Admission: RE | Admit: 2022-09-07 | Discharge: 2022-09-07 | Disposition: A | Discharge status: Home / Self Care | Payer: Medicare Other | Source: Ambulatory Visit | Attending: Urology | Admitting: Urology

## 2022-09-07 VITALS — BP 118/67 | HR 72 | Temp 98.1°F | Resp 22 | Ht 66.0 in | Wt 125.0 lb

## 2022-09-07 DIAGNOSIS — J449 Chronic obstructive pulmonary disease, unspecified: Secondary | ICD-10-CM | POA: Insufficient documentation

## 2022-09-07 DIAGNOSIS — Z955 Presence of coronary angioplasty implant and graft: Secondary | ICD-10-CM | POA: Insufficient documentation

## 2022-09-07 DIAGNOSIS — N35919 Unspecified urethral stricture, male, unspecified site: Secondary | ICD-10-CM | POA: Insufficient documentation

## 2022-09-07 DIAGNOSIS — K219 Gastro-esophageal reflux disease without esophagitis: Secondary | ICD-10-CM | POA: Insufficient documentation

## 2022-09-07 DIAGNOSIS — I2510 Atherosclerotic heart disease of native coronary artery without angina pectoris: Secondary | ICD-10-CM | POA: Diagnosis not present

## 2022-09-07 DIAGNOSIS — Z87891 Personal history of nicotine dependence: Secondary | ICD-10-CM | POA: Insufficient documentation

## 2022-09-07 DIAGNOSIS — I252 Old myocardial infarction: Secondary | ICD-10-CM | POA: Insufficient documentation

## 2022-09-07 DIAGNOSIS — Z8674 Personal history of sudden cardiac arrest: Secondary | ICD-10-CM | POA: Diagnosis not present

## 2022-09-07 DIAGNOSIS — Z01812 Encounter for preprocedural laboratory examination: Secondary | ICD-10-CM | POA: Insufficient documentation

## 2022-09-07 DIAGNOSIS — R7303 Prediabetes: Secondary | ICD-10-CM | POA: Insufficient documentation

## 2022-09-07 DIAGNOSIS — I10 Essential (primary) hypertension: Secondary | ICD-10-CM | POA: Diagnosis not present

## 2022-09-07 DIAGNOSIS — C679 Malignant neoplasm of bladder, unspecified: Secondary | ICD-10-CM | POA: Insufficient documentation

## 2022-09-07 HISTORY — DX: Unspecified osteoarthritis, unspecified site: M19.90

## 2022-09-07 HISTORY — DX: Personal history of diseases of the blood and blood-forming organs and certain disorders involving the immune mechanism: Z86.2

## 2022-09-07 LAB — CBC
HCT: 50.6 % (ref 39.0–52.0)
Hemoglobin: 16.6 g/dL (ref 13.0–17.0)
MCH: 30.2 pg (ref 26.0–34.0)
MCHC: 32.8 g/dL (ref 30.0–36.0)
MCV: 92 fL (ref 80.0–100.0)
Platelets: 477 10*3/uL — ABNORMAL HIGH (ref 150–400)
RBC: 5.5 MIL/uL (ref 4.22–5.81)
RDW: 13.1 % (ref 11.5–15.5)
WBC: 15 10*3/uL — ABNORMAL HIGH (ref 4.0–10.5)
nRBC: 0 % (ref 0.0–0.2)

## 2022-09-07 LAB — HEMOGLOBIN A1C
Hgb A1c MFr Bld: 5.8 % — ABNORMAL HIGH (ref 4.8–5.6)
Mean Plasma Glucose: 119.76 mg/dL

## 2022-09-07 LAB — BASIC METABOLIC PANEL
Anion gap: 9 (ref 5–15)
BUN: 12 mg/dL (ref 8–23)
CO2: 27 mmol/L (ref 22–32)
Calcium: 9.6 mg/dL (ref 8.9–10.3)
Chloride: 104 mmol/L (ref 98–111)
Creatinine, Ser: 0.83 mg/dL (ref 0.61–1.24)
GFR, Estimated: >60 mL/min (ref >60)
Glucose, Bld: 108 mg/dL — ABNORMAL HIGH (ref 70–99)
Potassium: 4.4 mmol/L (ref 3.5–5.1)
Sodium: 140 mmol/L (ref 135–145)

## 2022-09-07 LAB — GLUCOSE, CAPILLARY: Glucose-Capillary: 105 mg/dL — ABNORMAL HIGH (ref 70–99)

## 2022-09-08 NOTE — Progress Notes (Signed)
Anesthesia Chart Review   Case: [id number] Date/Time: 09/15/22 1245   Procedures:      CYSTOSCOPY WITH DIRECT VISION INTERNAL URETHROTOMY WITH BALLOON DILATION     POSSIBLE TRANSURETHRAL RESECTION OF BLADDER TUMOR (TURBT),POSSIBLE DVIU,   Anesthesia type: Choice   Pre-op diagnosis: urethral stricture; bladder cancer   Location: [facility] PROC[facility]   Surgeons: Joline Maxcy, MD       DISCUSSION:66 y.o. former smoker with history of COPD on O2 at bedtime, GERD, CAD, urethral stricture, bladder cancer scheduled for above procedure 09/15/2022 with Dr. Marcha Solders.  hx of cardiac arrest in 2015 with PCI to RCA.  Patient seen by cardiology 08/25/2022.  Per office visit note, "Preoperative clearance: Patient has upcoming urology procedure and also colonoscopy. He denies any recent chest discomfort. I discussed his case with DOD Dr. Rennis Golden, he is at acceptable risk to proceed with urology procedure by holding Brilinta for 7 days prior to the procedure. He may also proceed with colonoscopy procedure after holding Brilinta for 5 days prior to this study and resume as soon as possible afterward at the discretion of his GI doctor."  Patient last seen by pulmonology 03/03/2022.  Stable at this visit with no changes made.  1 year follow-up recommended.  Anticipate pt can proceed with planned procedure barring acute status change.   VS: BP 118/67 Comment: right arm sitting  Pulse 72   Temp 36.7 C   Resp (!) 22   Ht 5\' 6"  (1.676 m)   Wt 56.7 kg   SpO2 97% Comment: just used Albuterol inhaler 20 minutes prior  BMI 20.18 kg/m   PROVIDERS: Marcine Matar, MD is PCP  Cardiologist - Nicki Guadalajara MD  LABS: Labs reviewed: Acceptable for surgery. (all labs ordered are listed, but only abnormal results are displayed)  Labs Reviewed  HEMOGLOBIN A1C - Abnormal; Notable for the following components:      Result Value   Hgb A1c MFr Bld 5.8 (*)    All other components within normal limits   BASIC METABOLIC PANEL - Abnormal; Notable for the following components:   Glucose, Bld 108 (*)    All other components within normal limits  CBC - Abnormal; Notable for the following components:   WBC 15.0 (*)    Platelets 477 (*)    All other components within normal limits  GLUCOSE, CAPILLARY - Abnormal; Notable for the following components:   Glucose-Capillary 105 (*)    All other components within normal limits     IMAGES:   EKG:   CV: Cardiac cath 02/24/2020 Inferior ST elevation MI related to occlusion of overlapping stents in the mid RCA. Total mid RCA occlusion was reduced to 0% with TIMI grade III flow following dilatation and placement of an overlapping 34 x 2.75 mm Onyx postdilated to 3.0 mm at high pressure throughout the stented segment. Normal left main Normal LAD Normal circumflex Normal LV function.  LVEDP is less than 15 mmHg.  No significant regional wall motion abnormality is noted.   RECOMMENDATIONS:   Aspirin and Brilinta long-term.  After 1 year, consider down grading antiplatelet therapy to clopidogrel 75 mg/day. Risk factor modification. Serial markers. Eligible for discharge within the next 24 to 36 hours.  Echo 02/24/2020 1. Left ventricular ejection fraction, by estimation, is 55 to 60%. The  left ventricle has normal function. The left ventricle has no regional  wall motion abnormalities. Left ventricular diastolic parameters were  normal.   2. Right  ventricular systolic function is normal. The right ventricular  size is normal. There is normal pulmonary artery systolic pressure.   3. The mitral valve is normal in structure. Trivial mitral valve  regurgitation. No evidence of mitral stenosis.   4. The aortic valve is tricuspid. There is mild calcification of the  aortic valve. Aortic valve regurgitation is not visualized. Mild aortic  valve sclerosis is present, with no evidence of aortic valve stenosis.   5. The inferior vena cava is  normal in size with greater than 50%  respiratory variability, suggesting right atrial pressure of 3 mmHg.  Myocardial perfusion 02/16/2017  The left ventricular ejection fraction is normal (55-65%). Nuclear stress EF: 59%. Blood pressure demonstrated a normal response to exercise. There was no ST segment deviation noted during stress. The study is normal.   1. Below average exercise tolerance.  2. No evidence for ischemia by ECG analysis.  3. Fixed apical inferior/apical perfusion defect.  Given normal wall motion, suspect soft tissue attenuation.  No ischemia.  4. EF 59%, normal wall motion.    Low risk study.  Past Medical History:  Diagnosis Date   Altered mental status, improved at discharge 06/19/2013   Arthritis    Bronchitis, chronic (HCC) 06/19/2013   CAD (coronary artery disease)    a. s/p cardiac arrest in 06/2013 with DES to RCA b. 02/2020: Inferior STEMI and cath showing total occlusion of mid-RCA treated with DES placement.    Cancer [facility])    bladder   Cardiac arrest, hypothermic protocol 06/12/2013   COPD exacerbation (HCC) 06/12/2013   GERD (gastroesophageal reflux disease)    Hx of leukocytosis    Hyperlipidemia LDL goal < 70 06/19/2013   Hypokalemia, replaced 06/19/2013   Hypomagnesemia, replaced 06/19/2013   Hypotension, requiring levophed 06/19/2013   NSTEMI (non-ST elevated myocardial infarction), DES to RCA 06/13/2013   Pre-diabetes    Tobacco abuse 06/19/2013    Past Surgical History:  Procedure Laterality Date   cardiac sstent     CORONARY ANGIOPLASTY WITH STENT PLACEMENT  06/12/2013   [employer] [place] DES to RCA, NSTEMI   CORONARY STENT INTERVENTION N/A 02/24/2020   Procedure: CORONARY STENT INTERVENTION;  Surgeon: Lyn Records, MD;  Location: [facility] INVASIVE CV LAB;  Service: Cardiovascular;  Laterality: N/A;   CORONARY/GRAFT ACUTE MI REVASCULARIZATION N/A 02/24/2020   Procedure: Coronary/Graft Acute MI Revascularization;  Surgeon: Lyn Records,  MD;  Location: [facility] INVASIVE CV LAB;  Service: Cardiovascular;  Laterality: N/A;   CYSTOSCOPY W/ RETROGRADES Left 03/29/2017   Procedure: CYSTOSCOPY WITH LEFT RETROGRADE PYELOGRAM;  Surgeon: Ihor Gully, MD;  Location: [facility];  Service: Urology;  Laterality: Left;   LEFT HEART CATH AND CORONARY ANGIOGRAPHY N/A 02/24/2020   Procedure: LEFT HEART CATH AND CORONARY ANGIOGRAPHY;  Surgeon: Lyn Records, MD;  Location: [facility] INVASIVE CV LAB;  Service: Cardiovascular;  Laterality: N/A;   LEFT HEART CATHETERIZATION WITH CORONARY ANGIOGRAM N/A 06/12/2013   Procedure: LEFT HEART CATHETERIZATION WITH CORONARY ANGIOGRAM;  Surgeon: Lennette Bihari, MD;  Location: [facility] CATH LAB;  Service: Cardiovascular;  Laterality: N/A;   TRANSURETHRAL RESECTION OF BLADDER TUMOR N/A 03/29/2017   Procedure: TRANSURETHRAL RESECTION OF BLADDER TUMOR (TURBT)/;  Surgeon: Ihor Gully, MD;  Location: [facility];  Service: Urology;  Laterality: N/A;   transurethral ressection of bladder tumor     Dr. Vernie Ammons 03/29/17    MEDICATIONS:  acetaminophen (TYLENOL) 500 MG tablet   albuterol (VENTOLIN HFA) 108 (90 Base) MCG/ACT inhaler   amLODipine (NORVASC) 5  MG tablet   aspirin EC 81 MG tablet   atorvastatin (LIPITOR) 80 MG tablet   bisoprolol (ZEBETA) 5 MG tablet   Budeson-Glycopyrrol-Formoterol (BREZTRI AEROSPHERE) 160-9-4.8 MCG/ACT AERO   famotidine (PEPCID) 20 MG tablet   finasteride (PROSCAR) 5 MG tablet   nitroGLYCERIN (NITROSTAT) 0.4 MG SL tablet   OXYGEN   ticagrelor (BRILINTA) 90 MG TABS tablet   No current facility-administered medications for this encounter.    Jodell Cipro Ward, PA-C [facility] Pre-Surgical Testing [phone]

## 2022-09-08 NOTE — Anesthesia Preprocedure Evaluation (Addendum)
Anesthesia Evaluation  Patient identified by MRN, date of birth, ID band Patient awake    Reviewed: Allergy & Precautions, NPO status , Patient's Chart, lab work & pertinent test results  History of Anesthesia Complications Negative for: history of anesthetic complications  Airway Mallampati: II  TM Distance: >3 FB Neck ROM: Full    Dental  (+) Dental Advisory Given   Pulmonary COPD,  COPD inhaler, former smoker   breath sounds clear to auscultation       Cardiovascular hypertension, Pt. on medications and Pt. on home beta blockers + CAD and + Past MI   Rhythm:Regular  NM stress 02/16/2017 The left ventricular ejection fraction is normal (55-65%). Nuclear stress EF: 59%. Blood pressure demonstrated a normal response to exercise. There was no ST segment deviation noted during stress. The study is normal.   1. Below average exercise tolerance.  2. No evidence for ischemia by ECG analysis.  3. Fixed apical inferior/apical perfusion defect.  Given normal wall motion, suspect soft tissue attenuation.  No ischemia.  4. EF 59%, normal wall motion.   Low risk study.    Echo 02/24/2017 - Left ventricle: The cavity size was normal. Wall thickness was normal. Systolic function was normal. The estimated ejection fraction was in the range of 55% to 60%. Wall motion was normal; there were no regional wall motion abnormalities. Features are consistent with a pseudonormal left ventricular filling pattern, with concomitant abnormal relaxation and increased filling  pressure (grade 2 diastolic dysfunction). - Mitral valve: Mild prolapse, involving the anterior leaflet.   Neuro/Psych negative neurological ROS  negative psych ROS   GI/Hepatic Neg liver ROS,GERD  ,,  Endo/Other  negative endocrine ROS    Renal/GU negative Renal ROS     Musculoskeletal  (+) Arthritis ,    Abdominal   Peds  Hematology negative hematology  ROS (+)   Anesthesia Other Findings   Reproductive/Obstetrics negative OB ROS                             Anesthesia Physical Anesthesia Plan  ASA: III  Anesthesia Plan: General   Post-op Pain Management:    Induction: Intravenous  PONV Risk Score and Plan: 3 and Ondansetron, Dexamethasone, Midazolam and Treatment may vary due to age or medical condition  Airway Management Planned: LMA  Additional Equipment: None  Intra-op Plan:   Post-operative Plan: Extubation in OR  Informed Consent: I have reviewed the patients History and Physical, chart, labs and discussed the procedure including the risks, benefits and alternatives for the proposed anesthesia with the patient or authorized representative who has indicated his/her understanding and acceptance.     Dental advisory given  Plan Discussed with: CRNA  Anesthesia Plan Comments: (See PAT note 09/07/2022)        Anesthesia Quick Evaluation

## 2022-09-15 ENCOUNTER — Encounter (HOSPITAL_COMMUNITY)
Admission: RE | Disposition: A | Discharge status: Home / Self Care | Payer: Self-pay | Source: Ambulatory Visit | Attending: Urology

## 2022-09-15 ENCOUNTER — Ambulatory Visit (HOSPITAL_COMMUNITY): Payer: Medicare Other

## 2022-09-15 ENCOUNTER — Encounter (HOSPITAL_COMMUNITY): Payer: Self-pay | Admitting: Urology

## 2022-09-15 ENCOUNTER — Ambulatory Visit (HOSPITAL_COMMUNITY)
Admission: RE | Admit: 2022-09-15 | Discharge: 2022-09-15 | Disposition: A | Discharge status: Home / Self Care | Payer: Medicare Other | Source: Ambulatory Visit | Attending: Urology | Admitting: Urology

## 2022-09-15 ENCOUNTER — Ambulatory Visit (HOSPITAL_BASED_OUTPATIENT_CLINIC_OR_DEPARTMENT_OTHER): Payer: Medicare Other | Admitting: Anesthesiology

## 2022-09-15 ENCOUNTER — Ambulatory Visit (HOSPITAL_COMMUNITY): Payer: Medicare Other | Admitting: Physician Assistant

## 2022-09-15 ENCOUNTER — Other Ambulatory Visit: Payer: Self-pay

## 2022-09-15 DIAGNOSIS — R7303 Prediabetes: Secondary | ICD-10-CM

## 2022-09-15 DIAGNOSIS — N35919 Unspecified urethral stricture, male, unspecified site: Secondary | ICD-10-CM | POA: Insufficient documentation

## 2022-09-15 DIAGNOSIS — I10 Essential (primary) hypertension: Secondary | ICD-10-CM | POA: Diagnosis not present

## 2022-09-15 DIAGNOSIS — J449 Chronic obstructive pulmonary disease, unspecified: Secondary | ICD-10-CM | POA: Diagnosis not present

## 2022-09-15 DIAGNOSIS — Z8551 Personal history of malignant neoplasm of bladder: Secondary | ICD-10-CM | POA: Insufficient documentation

## 2022-09-15 DIAGNOSIS — C679 Malignant neoplasm of bladder, unspecified: Secondary | ICD-10-CM | POA: Diagnosis not present

## 2022-09-15 DIAGNOSIS — I2510 Atherosclerotic heart disease of native coronary artery without angina pectoris: Secondary | ICD-10-CM | POA: Diagnosis not present

## 2022-09-15 DIAGNOSIS — Z09 Encounter for follow-up examination after completed treatment for conditions other than malignant neoplasm: Secondary | ICD-10-CM | POA: Insufficient documentation

## 2022-09-15 DIAGNOSIS — N99116 Postprocedural urethral stricture, male, overlapping sites: Secondary | ICD-10-CM

## 2022-09-15 DIAGNOSIS — Z79899 Other long term (current) drug therapy: Secondary | ICD-10-CM | POA: Insufficient documentation

## 2022-09-15 DIAGNOSIS — R972 Elevated prostate specific antigen [PSA]: Secondary | ICD-10-CM | POA: Diagnosis not present

## 2022-09-15 DIAGNOSIS — Z87891 Personal history of nicotine dependence: Secondary | ICD-10-CM | POA: Insufficient documentation

## 2022-09-15 DIAGNOSIS — N35816 Other urethral stricture, male, overlapping sites: Secondary | ICD-10-CM

## 2022-09-15 DIAGNOSIS — I252 Old myocardial infarction: Secondary | ICD-10-CM | POA: Insufficient documentation

## 2022-09-15 HISTORY — PX: CYSTOSCOPY WITH DIRECT VISION INTERNAL URETHROTOMY: SHX6637

## 2022-09-15 LAB — GLUCOSE, CAPILLARY: Glucose-Capillary: 107 mg/dL — ABNORMAL HIGH (ref 70–99)

## 2022-09-15 SURGERY — CYSTOSCOPY, WITH DIRECT VISION INTERNAL URETHROTOMY
Anesthesia: Choice

## 2022-09-15 MED ORDER — SODIUM CHLORIDE 0.9 % IR SOLN
Status: DC | PRN
  Administered 2022-09-15: 1000 mL

## 2022-09-15 MED ORDER — ACETAMINOPHEN 160 MG/5ML PO SOLN: 1000.0000 mg | Freq: Once | ORAL | Status: DC | PRN

## 2022-09-15 MED ORDER — PROPOFOL 10 MG/ML IV BOLUS
INTRAVENOUS | Status: AC
  Filled 2022-09-15: qty 20

## 2022-09-15 MED ORDER — ROCURONIUM BROMIDE 100 MG/10ML IV SOLN
INTRAVENOUS | Status: DC | PRN
  Administered 2022-09-15: 70 mg via INTRAVENOUS

## 2022-09-15 MED ORDER — MIDAZOLAM HCL 2 MG/2ML IJ SOLN
INTRAMUSCULAR | Status: AC
  Filled 2022-09-15: qty 2

## 2022-09-15 MED ORDER — ACETAMINOPHEN 500 MG PO TABS: 1000.0000 mg | ORAL_TABLET | Freq: Once | ORAL | Status: DC | PRN

## 2022-09-15 MED ORDER — EPHEDRINE 5 MG/ML INJ
INTRAVENOUS | Status: AC
  Filled 2022-09-15: qty 5

## 2022-09-15 MED ORDER — FENTANYL CITRATE PF 50 MCG/ML IJ SOSY: 25.0000 ug | PREFILLED_SYRINGE | INTRAMUSCULAR | Status: DC | PRN

## 2022-09-15 MED ORDER — ONDANSETRON HCL 4 MG/2ML IJ SOLN
INTRAMUSCULAR | Status: DC | PRN
  Administered 2022-09-15: 4 mg via INTRAVENOUS

## 2022-09-15 MED ORDER — OXYCODONE HCL 5 MG PO TABS
ORAL_TABLET | ORAL | Status: AC
  Filled 2022-09-15: qty 1

## 2022-09-15 MED ORDER — ORAL CARE MOUTH RINSE: 15.0000 mL | Freq: Once | OROMUCOSAL | Status: AC

## 2022-09-15 MED ORDER — ACETAMINOPHEN 10 MG/ML IV SOLN
INTRAVENOUS | Status: AC
  Filled 2022-09-15: qty 100

## 2022-09-15 MED ORDER — LIDOCAINE HCL (CARDIAC) PF 100 MG/5ML IV SOSY
PREFILLED_SYRINGE | INTRAVENOUS | Status: DC | PRN
  Administered 2022-09-15: 100 mg via INTRAVENOUS

## 2022-09-15 MED ORDER — DEXAMETHASONE SODIUM PHOSPHATE 10 MG/ML IJ SOLN
INTRAMUSCULAR | Status: DC | PRN
  Administered 2022-09-15: 4 mg via INTRAVENOUS

## 2022-09-15 MED ORDER — SUGAMMADEX SODIUM 200 MG/2ML IV SOLN
INTRAVENOUS | Status: DC | PRN
  Administered 2022-09-15: 200 mg via INTRAVENOUS

## 2022-09-15 MED ORDER — LIDOCAINE HCL URETHRAL/MUCOSAL 2 % EX GEL
CUTANEOUS | Status: AC
  Filled 2022-09-15: qty 30

## 2022-09-15 MED ORDER — OXYCODONE HCL 5 MG PO TABS: 5.0000 mg | ORAL_TABLET | ORAL | 0 refills | Status: DC | PRN

## 2022-09-15 MED ORDER — FENTANYL CITRATE (PF) 100 MCG/2ML IJ SOLN
INTRAMUSCULAR | Status: AC
  Filled 2022-09-15: qty 2

## 2022-09-15 MED ORDER — SODIUM CHLORIDE 0.9 % IV SOLN
INTRAVENOUS | Status: AC
  Filled 2022-09-15: qty 20

## 2022-09-15 MED ORDER — IOHEXOL 300 MG/ML  SOLN
INTRAMUSCULAR | Status: DC | PRN
  Administered 2022-09-15: 23 mL

## 2022-09-15 MED ORDER — SULFAMETHOXAZOLE-TRIMETHOPRIM 800-160 MG PO TABS: 1.0000 | ORAL_TABLET | Freq: Two times a day (BID) | ORAL | 0 refills | Status: DC

## 2022-09-15 MED ORDER — SODIUM CHLORIDE 0.9 % IV SOLN
1.0000 g | INTRAVENOUS | Status: AC
  Administered 2022-09-15: 1 g via INTRAVENOUS
  Filled 2022-09-15: qty 10

## 2022-09-15 MED ORDER — FENTANYL CITRATE (PF) 100 MCG/2ML IJ SOLN
INTRAMUSCULAR | Status: DC | PRN
  Administered 2022-09-15: 100 ug via INTRAVENOUS

## 2022-09-15 MED ORDER — ACETAMINOPHEN 10 MG/ML IV SOLN
1000.0000 mg | Freq: Once | INTRAVENOUS | Status: DC | PRN
  Administered 2022-09-15: 1000 mg via INTRAVENOUS

## 2022-09-15 MED ORDER — CHLORHEXIDINE GLUCONATE 0.12 % MT SOLN
15.0000 mL | Freq: Once | OROMUCOSAL | Status: AC
  Administered 2022-09-15: 15 mL via OROMUCOSAL

## 2022-09-15 MED ORDER — EPHEDRINE SULFATE (PRESSORS) 50 MG/ML IJ SOLN
INTRAMUSCULAR | Status: DC | PRN
  Administered 2022-09-15: 10 mg via INTRAVENOUS

## 2022-09-15 MED ORDER — PHENYLEPHRINE HCL (PRESSORS) 10 MG/ML IV SOLN
INTRAVENOUS | Status: DC | PRN
  Administered 2022-09-15 (×3): 160 ug via INTRAVENOUS

## 2022-09-15 MED ORDER — OXYCODONE HCL 5 MG/5ML PO SOLN: 5.0000 mg | Freq: Once | ORAL | Status: AC | PRN

## 2022-09-15 MED ORDER — LACTATED RINGERS IV SOLN: INTRAVENOUS | Status: DC

## 2022-09-15 MED ORDER — PROPOFOL 1000 MG/100ML IV EMUL
INTRAVENOUS | Status: AC
  Filled 2022-09-15: qty 100

## 2022-09-15 MED ORDER — MIDAZOLAM HCL 5 MG/5ML IJ SOLN
INTRAMUSCULAR | Status: DC | PRN
  Administered 2022-09-15: 2 mg via INTRAVENOUS

## 2022-09-15 MED ORDER — SODIUM CHLORIDE 0.9 % IR SOLN
Status: DC | PRN
  Administered 2022-09-15: 3000 mL via INTRAVESICAL

## 2022-09-15 MED ORDER — PROPOFOL 10 MG/ML IV BOLUS
INTRAVENOUS | Status: DC | PRN
  Administered 2022-09-15: 130 mg via INTRAVENOUS

## 2022-09-15 MED ORDER — OXYCODONE HCL 5 MG PO TABS
5.0000 mg | ORAL_TABLET | Freq: Once | ORAL | Status: AC | PRN
  Administered 2022-09-15: 5 mg via ORAL

## 2022-09-15 SURGICAL SUPPLY — 34 items
BAG DRN RND TRDRP ANRFLXCHMBR (UROLOGICAL SUPPLIES) ×1
BAG URINE DRAIN 2000ML AR STRL (UROLOGICAL SUPPLIES) ×1 IMPLANT
BAG URO CATCHER STRL LF (MISCELLANEOUS) ×1 IMPLANT
BALLN NEPHROSTOMY (BALLOONS) ×1
BALLOON NEPHROSTOMY (BALLOONS) IMPLANT
CATH FOLEY 2W COUNCIL 20FR 5CC (CATHETERS) IMPLANT
CATH ROBINSON RED A/P 14FR (CATHETERS) IMPLANT
CATH URET 5FR 28IN CONE TIP (BALLOONS)
CATH URET 5FR 70CM CONE TIP (BALLOONS) IMPLANT
CATH URETL OPEN END 6FR 70 (CATHETERS) IMPLANT
CLOTH BEACON ORANGE TIMEOUT ST (SAFETY) ×1 IMPLANT
DRAPE FOOT SWITCH (DRAPES) ×1 IMPLANT
ELECT REM PT RETURN 15FT ADLT (MISCELLANEOUS) ×1 IMPLANT
EVACUATOR MICROVAS BLADDER (UROLOGICAL SUPPLIES) IMPLANT
GLOVE BIO SURGEON STRL SZ7.5 (GLOVE) ×1 IMPLANT
GLOVE BIO SURGEON STRL SZ8.5 (GLOVE) ×1 IMPLANT
GOWN STRL REUS W/ TWL XL LVL3 (GOWN DISPOSABLE) ×1 IMPLANT
GOWN STRL REUS W/TWL XL LVL3 (GOWN DISPOSABLE) ×1
GUIDEWIRE ANG ZIPWIRE 038X150 (WIRE) IMPLANT
GUIDEWIRE STR DUAL SENSOR (WIRE) IMPLANT
HOLDER FOLEY CATH W/STRAP (MISCELLANEOUS) IMPLANT
KIT TURNOVER KIT A (KITS) IMPLANT
LOOP CUT BIPOLAR 24F LRG (ELECTROSURGICAL) IMPLANT
MANIFOLD NEPTUNE II (INSTRUMENTS) ×1 IMPLANT
NS IRRIG 1000ML POUR BTL (IV SOLUTION) IMPLANT
PACK CYSTO (CUSTOM PROCEDURE TRAY) ×1 IMPLANT
PENCIL SMOKE EVACUATOR (MISCELLANEOUS) IMPLANT
PLUG CATH AND CAP STRL 200 (CATHETERS) ×1 IMPLANT
SYR CONTROL 10ML LL (SYRINGE) IMPLANT
SYR TOOMEY IRRIG 70ML (MISCELLANEOUS)
SYRINGE TOOMEY IRRIG 70ML (MISCELLANEOUS) IMPLANT
TUBING CONNECTING 10 (TUBING) ×1 IMPLANT
TUBING UROLOGY SET (TUBING) ×1 IMPLANT
WATER STERILE IRR 3000ML UROMA (IV SOLUTION) ×1 IMPLANT

## 2022-09-15 NOTE — Anesthesia Procedure Notes (Signed)
Procedure Name: Intubation Date/Time: 09/15/2022 1:09 PM  Performed by: Theodosia Quay, CRNAPre-anesthesia Checklist: Patient identified, Emergency Drugs available, Suction available, Patient being monitored and Timeout performed Patient Re-evaluated:Patient Re-evaluated prior to induction Oxygen Delivery Method: Circle system utilized Preoxygenation: Pre-oxygenation with 100% oxygen Induction Type: IV induction Ventilation: Mask ventilation without difficulty Laryngoscope Size: Mac and 3 Grade View: Grade II Tube type: Oral Tube size: 7.0 mm Number of attempts: 1 Airway Equipment and Method: Stylet Placement Confirmation: ETT inserted through vocal cords under direct vision, positive ETCO2, CO2 detector and breath sounds checked- equal and bilateral Secured at: 21 cm Tube secured with: Tape Dental Injury: Teeth and Oropharynx as per pre-operative assessment  Comments: ATOI

## 2022-09-15 NOTE — Transfer of Care (Signed)
Immediate Anesthesia Transfer of Care Note  Patient: Timothy [name]  Procedure(s) Performed: Procedure(s): CYSTOSCOPY WITH BALLOON DILATION (N/A)  Patient Location: PACU  Anesthesia Type:General  Level of Consciousness:  sedated, patient cooperative and responds to stimulation  Airway & Oxygen Therapy:Patient Spontanous Breathing and Patient connected to face mask oxgen  Post-op Assessment:  Report given to PACU RN and Post -op Vital signs reviewed and stable  Post vital signs:  Reviewed and stable  Last Vitals:  Vitals:   09/15/22 1055 09/15/22 1342  BP: 134/80 130/68  Pulse: 75 65  Resp:  (!) 22  Temp: (!) 36.4 C   SpO2: 95% 100%    Complications: No apparent anesthesia complications

## 2022-09-15 NOTE — Op Note (Signed)
OPERATIVE NOTE   Patient Name: Timothy [name]  MRN: [id number]   Date of Procedure: 09/15/2022   Preoperative diagnosis:  Urethral stricture disease H/o bladder cancer (low risk NMIBC)  Postoperative diagnosis:  same  Procedure:  Cystoscopy, dilation of urethral stricure  Attending: Concepcion Living, MD  Anesthesia: geta  Estimated blood loss: minimal  Fluids: Per anesthesia record  Drains: 20Fr Council tip catheter  Specimens: none  Antibiotics: rocephin  Findings: panurothelial stricture from distal penile to bulb  Indications:  Urethral stricture H/o bladder cancer (low-risk NMIBC)  Description of Procedure:  Patient was brought to the operating room where he was correctly identified placed in the supine position where he underwent successful induction of general endotracheal anesthesia.  Patient was then repositioned in the modified dorsolithotomy position and prepped and draped in usual sterile fashion.  Preprocedural timeout was performed.  Cystoscopy was subsequently performed with a 21 [place] panendoscope.  I can only advance it for a few centimeters where there was a distal penile stricture.  Under direct vision under fluoroscopic control I was able to pass a sensor wire directly through the stricture and follow it to the point where it coiled within the bladder.  I then deployed a 24 [place] urethral dilation balloon and dilated the urethra from the membranous region all the way out to the urethral meatus.  This dilated quite nicely.  The cystoscope was subsequently advanced into the bladder after removing the balloon.  The prostatic urethra revealed some mild lateral lobe enlargement.  The bladder was then carefully inspected.  The ureteral openings appeared normal.  There were no focal mucosal lesions appreciated.  At this time the cystoscope was removed and over the safety wire I then placed a 20 [place] council tip Foley with good position obtained and  drainage of clear fluid.  The balloon was inflated with 10 mL of sterile water.  The procedure was then terminated.  Patient tolerated the procedure well.  There were no apparent complications.  He was subsequent taken to the recovery room in satisfactory condition.  Complications: None  Condition: Stable, extubated, transferred to PACU  Plan: patient will remove catheter in 3 days; office FU in 3 weeks

## 2022-09-15 NOTE — H&P (Signed)
 Assessment: 1. Malignant neoplasm of urinary bladder, unspecified site (HCC)   2. Elevated PSA   3. Postprocedural stricture of overlapping sites of urethra in male     Plan: Today had a long discussion with the patient regarding his urologic issues.  Given his urethral stricture I was unable to perform surveillance cystoscopy.  I recommend to proceed with cystoscopy in the operating room with urethral dilation/DVIU.  Nature procedure reviewed in detail including potential adverse events and complications.  Patient agrees to proceed.  Also discussed with the patient his elevated PSA and BPH.  We discussed medical management options.  Following our discussion he would like to begin new medical therapy. Rx: Finasteride 5 mg daily Nature of medication including proper utilization as well as potential adverse events and side effects reviewed.  Chief Complaint: Bladder cancer, urethral stricture  HPI: Timothy [name] is a 66 y.o. male who presents for continued evaluation of elevated PSA and history of low risk nonmuscle invasive bladder cancer. Patient is now approximately 1 week status post TRUS/BX which was negative for tumor.  He reports no complications as a result of his biopsy.  Please see my note 07/02/2022 at the time of initial visit for detailed history and exam. Patient had screening psa 06/2022= 6.8 Status post TRUS/BX 07/2022--pathology benign; prostate volume 36 mL No known prior PSA testing. No fam hx of CaP Patient denies significant LUTS.   Patient also has a history of low risk nonmuscle invasive bladder cancer. Status post TURBT 03/2017-low-grade TA.  Patient did not receive perioperative chemo due to bladder perforation.  Patient has not had routine follow-up// last cystoscopy 10/2018 in [place] Dr. Ottelin negative.  Portions of the above documentation were copied from a prior visit for review purposes only.  Allergies: Allergies  Allergen Reactions   Ace  Inhibitors Other (See Comments)    Caused wheezing   Aspirin Swelling and Other (See Comments)    Lips swell- still takes approx 2 times a week in 2021   Ibuprofen Swelling and Other (See Comments)    Lips swell   Losartan Other (See Comments)    Leg cramps    PMH: Past Medical History:  Diagnosis Date   Altered mental status, improved at discharge 06/19/2013   Bronchitis, chronic (HCC) 06/19/2013   CAD (coronary artery disease)    a. s/p cardiac arrest in 06/2013 with DES to RCA b. 02/2020: Inferior STEMI and cath showing total occlusion of mid-RCA treated with DES placement.    Cancer (HCC)    bladder   Cardiac arrest, hypothermic protocol 06/12/2013   COPD exacerbation (HCC) 06/12/2013   GERD (gastroesophageal reflux disease)    Hyperlipidemia LDL goal < 70 06/19/2013   Hypokalemia, replaced 06/19/2013   Hypomagnesemia, replaced 06/19/2013   Hypotension, requiring levophed 06/19/2013   NSTEMI (non-ST elevated myocardial infarction), DES to RCA 06/13/2013   Pre-diabetes    Tobacco abuse 06/19/2013    PSH: Past Surgical History:  Procedure Laterality Date   cardiac sstent     CORONARY ANGIOPLASTY WITH STENT PLACEMENT  06/12/13   Xience Alpine DES to RCA, NSTEMI   CORONARY STENT INTERVENTION N/A 02/24/2020   Procedure: CORONARY STENT INTERVENTION;  Surgeon: Smith, Henry W, MD;  [facility]: [facility];  Service: Cardiovascular;  Laterality: N/A;   CORONARY/GRAFT ACUTE MI REVASCULARIZATION N/A 02/24/2020   Procedure: Coronary/Graft Acute MI Revascularization;  Surgeon: Smith, Henry W, MD;  Location: [facility];  Service: Cardiovascular;  Laterality: N/A;     CYSTOSCOPY W/ RETROGRADES Left 03/29/2017   Procedure: CYSTOSCOPY WITH LEFT RETROGRADE PYELOGRAM;  Surgeon: Ottelin, Mark, MD;  Location: [facility];  Service: Urology;  Laterality: Left;   LEFT HEART CATH AND CORONARY ANGIOGRAPHY N/A 02/24/2020   Procedure: LEFT HEART CATH AND CORONARY ANGIOGRAPHY;  Surgeon: Smith, Henry W,  MD;  [facility]: [facility];  Service: Cardiovascular;  Laterality: N/A;   LEFT HEART CATHETERIZATION WITH CORONARY ANGIOGRAM N/A 06/12/2013   Procedure: LEFT HEART CATHETERIZATION WITH CORONARY ANGIOGRAM;  Surgeon: Thomas A Kelly, MD;  Location: [facility];  Service: Cardiovascular;  Laterality: N/A;   NO PAST SURGERIES     TRANSURETHRAL RESECTION OF BLADDER TUMOR N/A 03/29/2017   Procedure: TRANSURETHRAL RESECTION OF BLADDER TUMOR (TURBT)/;  Surgeon: Ottelin, Mark, MD;  Location: [facility];  Service: Urology;  Laterality: N/A;   transurethral ressection of bladder tumor     Dr. Ottelin 03/29/17    SH: Social History   Tobacco Use   Smoking status: Former    Packs/day: 2.00    Years: 38.00    Additional pack years: 0.00    Total pack years: 76.00    Types: Cigarettes    Quit date: 07/10/2013    Years since quitting: 9.0   Smokeless tobacco: Never  Vaping Use   Vaping Use: Never used  Substance Use Topics   Alcohol use: No   Drug use: No    Types: Marijuana    ROS: Constitutional:  Negative for fever, chills, weight loss CV: Negative for chest pain, previous MI, hypertension Respiratory:  Negative for shortness of breath, wheezing, sleep apnea, frequent cough GI:  Negative for nausea, vomiting, bloody stool, GERD  PE: BP (!) 147/75   Pulse 75   Ht 5' 6" (1.676 m)   Wt 124 lb (56.2 kg)   BMI 20.01 kg/m  GENERAL APPEARANCE:  Well appearing, well developed, well nourished, NAD HEENT:  Atraumatic, normocephalic COR:  RR LUNGS:  CTA ABDOMEN:  Soft, non-tender, no masses EXTREMITIES:  Moves all extremities well NEUROLOGIC:  Alert and oriented x 3 MENTAL STATUS:  appropriate SKIN:  Warm, dry, and intact   Procedure: Flexible cystoscopy  Indication: History of bladder cancer  Description of procedure: Following the attainment of informed consent the patient was positioned in the supine position and prepped and draped in the usual sterile fashion 2% lidocaine jelly  was inserted per urethra for local analgesia.  Flexible cystoscopy was subsequently performed.   Anterior urethra: Fairly tight distal penile urethral stricture-could not pass scope further proximally  Posterior urethra: N/A  Bladder: N/A  Procedure was well-tolerated.  No complications. 

## 2022-09-16 ENCOUNTER — Encounter (HOSPITAL_COMMUNITY): Payer: Self-pay | Admitting: Urology

## 2022-09-18 NOTE — Anesthesia Postprocedure Evaluation (Signed)
Anesthesia Post Note  Patient: Timothy [name]  Procedure(s) Performed: CYSTOSCOPY WITH BALLOON DILATION     Patient location during evaluation: PACU Anesthesia Type: General Level of consciousness: awake and alert Pain management: pain level controlled Vital Signs Assessment: post-procedure vital signs reviewed and stable Respiratory status: spontaneous breathing, nonlabored ventilation, respiratory function stable and patient connected to nasal cannula oxygen Cardiovascular status: blood pressure returned to baseline and stable Postop Assessment: no apparent nausea or vomiting Anesthetic complications: no   No notable events documented.  Last Vitals:  Vitals:   09/15/22 1415 09/15/22 1452  BP: 114/71 105/67  Pulse: 71 70  Resp: 11   Temp: 36.4 C   SpO2: 92% 94%    Last Pain:  Vitals:   09/15/22 1452  TempSrc:   PainSc: 5                  Marnisha Stampley

## 2022-09-26 ENCOUNTER — Other Ambulatory Visit: Payer: Self-pay | Admitting: Internal Medicine

## 2022-09-29 ENCOUNTER — Ambulatory Visit (INDEPENDENT_AMBULATORY_CARE_PROVIDER_SITE_OTHER): Payer: Medicare Other | Admitting: Urology

## 2022-09-29 ENCOUNTER — Encounter: Payer: Self-pay | Admitting: Urology

## 2022-09-29 VITALS — BP 164/84 | HR 82 | Ht 66.0 in | Wt 125.0 lb

## 2022-09-29 DIAGNOSIS — Z8551 Personal history of malignant neoplasm of bladder: Secondary | ICD-10-CM

## 2022-09-29 DIAGNOSIS — C679 Malignant neoplasm of bladder, unspecified: Secondary | ICD-10-CM

## 2022-09-29 DIAGNOSIS — R972 Elevated prostate specific antigen [PSA]: Secondary | ICD-10-CM

## 2022-09-29 DIAGNOSIS — N99116 Postprocedural urethral stricture, male, overlapping sites: Secondary | ICD-10-CM | POA: Diagnosis not present

## 2022-09-29 NOTE — Progress Notes (Signed)
Assessment: 1. Postprocedural stricture of overlapping sites of urethra in male   2. Elevated PSA   3. Malignant neoplasm of urinary bladder, unspecified site [facility])     Plan: Today had a long discussion with the patient regarding his history of low risk nonmuscle invasive bladder cancer and current guidelines for follow-up.  Since he is now over 5 years out following his initial tumor without recurrence we discussed in a shared decision fashion whether or not to proceed with ongoing routine cystoscopies.  Patient is very clear that he does not want to do that unless he develops any type of symptoms including gross hematuria. Patient will return in 6 months with PSA prior to visit to follow-up his elevated PSA.  Chief Complaint: Urethral stricture  HPI: Timothy [name] is a 67 y.o. male who presents for continued evaluation of a number of urologic issues. This includes elevated PSA s/p neg bx 07/2022, urethral stricture disease and history of low risk of bladder cancer..  Please see my note 07/02/2022 at the time of initial visit for detailed history and exam. Patient had screening psa 06/2022= 6.8 Status post TRUS/BX 07/2022--pathology benign; prostate volume 36 mL No known prior PSA testing. No fam hx of CaP  He has history of low risk nonmuscle invasive bladder cancer. Status post TURBT 03/2017-low-grade TA.  Patient did not receive perioperative chemo due to bladder perforation.  Patient has not had routine follow-up// last cystoscopy 10/2018 in [place] Dr. Vernie Ammons negative.  Patient is status post urethral dilation and cystoscopy on 09/15/2022.  There was no evidence of recurrent tumor at that time on cystoscopy.  Patient is doing well since his procedure.  Current IPSS = 1.    Portions of the above documentation were copied from a prior visit for review purposes only.  Allergies: Allergies  Allergen Reactions   Ace Inhibitors Other (See Comments)    Caused wheezing    Aspirin Swelling and Other (See Comments)    Lips swell- still takes approx 2 times a week in 2021 Low dose ASA is fine, no problems   Ibuprofen Swelling and Other (See Comments)    Lips swell   Losartan Other (See Comments)    Leg cramps    PMH: Past Medical History:  Diagnosis Date   Altered mental status, improved at discharge 06/19/2013   Arthritis    Bronchitis, chronic (HCC) 06/19/2013   CAD (coronary artery disease)    a. s/p cardiac arrest in 06/2013 with DES to RCA b. 02/2020: Inferior STEMI and cath showing total occlusion of mid-RCA treated with DES placement.    [facility])    bladder   Cardiac arrest, hypothermic protocol 06/12/2013   COPD exacerbation (HCC) 06/12/2013   GERD (gastroesophageal reflux disease)    Hx of leukocytosis    Hyperlipidemia LDL goal < 70 06/19/2013   Hypokalemia, replaced 06/19/2013   Hypomagnesemia, replaced 06/19/2013   Hypotension, requiring levophed 06/19/2013   NSTEMI (non-ST elevated myocardial infarction), DES to RCA 06/13/2013   Pre-diabetes    Tobacco abuse 06/19/2013    PSH: Past Surgical History:  Procedure Laterality Date   cardiac sstent     CORONARY ANGIOPLASTY WITH STENT PLACEMENT  06/12/2013   Xience Alpine DES to RCA, NSTEMI   CORONARY STENT INTERVENTION N/A 02/24/2020   Procedure: CORONARY STENT INTERVENTION;  Surgeon: Lyn Records, MD;  [facility];  Service: Cardiovascular;  Laterality: N/A;   CORONARY/GRAFT ACUTE MI REVASCULARIZATION N/A 02/24/2020   Procedure:  Coronary/Graft Acute MI Revascularization;  Surgeon: Lyn Records, MD;  Location: [facility];  Service: Cardiovascular;  Laterality: N/A;   CYSTOSCOPY W/ RETROGRADES Left 03/29/2017   Procedure: CYSTOSCOPY WITH LEFT RETROGRADE PYELOGRAM;  Surgeon: Ihor Gully, MD;  Location: [facility];  Service: Urology;  Laterality: Left;   CYSTOSCOPY WITH DIRECT VISION INTERNAL URETHROTOMY N/A 09/15/2022   Procedure: CYSTOSCOPY WITH BALLOON  DILATION;  Surgeon: Joline Maxcy, MD;  Location: [facility];  Service: Urology;  Laterality: N/A;   LEFT HEART CATH AND CORONARY ANGIOGRAPHY N/A 02/24/2020   Procedure: LEFT HEART CATH AND CORONARY ANGIOGRAPHY;  Surgeon: Lyn Records, MD;  [facility];  Service: Cardiovascular;  Laterality: N/A;   LEFT HEART CATHETERIZATION WITH CORONARY ANGIOGRAM N/A 06/12/2013   Procedure: LEFT HEART CATHETERIZATION WITH CORONARY ANGIOGRAM;  Surgeon: Lennette Bihari, MD;  Location[facility];  Service: Cardiovascular;  Laterality: N/A;   TRANSURETHRAL RESECTION OF BLADDER TUMOR N/A 03/29/2017   Procedure: TRANSURETHRAL RESECTION OF BLADDER TUMOR (TURBT)/;  Surgeon: Ihor Gully, MD;  Location: [facility];  Service: Urology;  Laterality: N/A;   transurethral ressection of bladder tumor     Dr. Vernie Ammons 03/29/17    SH: Social History   Tobacco Use   Smoking status: Former    Packs/day: 2.00    Years: 38.00    Additional pack years: 0.00    Total pack years: 76.00    Types: Cigarettes    Quit date: 07/10/2013    Years since quitting: 9.2   Smokeless tobacco: Never  Vaping Use   Vaping Use: Never used  Substance Use Topics   Alcohol use: No   Drug use: No    Types: Marijuana    ROS: Constitutional:  Negative for fever, chills, weight loss CV: Negative for chest pain, previous MI, hypertension Respiratory:  Negative for shortness of breath, wheezing, sleep apnea, frequent cough GI:  Negative for nausea, vomiting, bloody stool, GERD  PE: BP (!) 164/84   Pulse 82   Ht 5\' 6"  (1.676 m)   Wt 125 lb (56.7 kg)   BMI 20.18 kg/m  GENERAL APPEARANCE:  Well appearing, well developed, well nourished, NAD

## 2022-10-01 LAB — MICROSCOPIC EXAMINATION
Cast Type: NONE SEEN
Casts: NONE SEEN /lpf
Crystal Type: NONE SEEN
Crystals: NONE SEEN
Epithelial Cells (non renal): NONE SEEN /hpf (ref 0–10)
Trichomonas, UA: NONE SEEN
Yeast, UA: NONE SEEN

## 2022-10-01 LAB — URINALYSIS, ROUTINE W REFLEX MICROSCOPIC
Bilirubin, UA: NEGATIVE
Glucose, UA: NEGATIVE
Ketones, UA: NEGATIVE
Leukocytes,UA: NEGATIVE
Nitrite, UA: NEGATIVE
Protein,UA: NEGATIVE
Specific Gravity, UA: 1.02 (ref 1.005–1.030)
Urobilinogen, Ur: 0.2 mg/dL (ref 0.2–1.0)
pH, UA: 6 (ref 5.0–7.5)

## 2022-10-13 ENCOUNTER — Other Ambulatory Visit: Payer: Self-pay

## 2022-10-13 ENCOUNTER — Encounter: Payer: Self-pay | Admitting: Internal Medicine

## 2022-10-13 ENCOUNTER — Ambulatory Visit: Payer: Medicare Other | Admitting: Internal Medicine

## 2022-10-13 VITALS — BP 128/74 | HR 85 | Ht 66.0 in | Wt 119.0 lb

## 2022-10-13 DIAGNOSIS — J439 Emphysema, unspecified: Secondary | ICD-10-CM

## 2022-10-13 DIAGNOSIS — R195 Other fecal abnormalities: Secondary | ICD-10-CM

## 2022-10-13 DIAGNOSIS — Z9981 Dependence on supplemental oxygen: Secondary | ICD-10-CM | POA: Diagnosis not present

## 2022-10-13 DIAGNOSIS — Z7902 Long term (current) use of antithrombotics/antiplatelets: Secondary | ICD-10-CM | POA: Diagnosis not present

## 2022-10-13 MED ORDER — NA SULFATE-K SULFATE-MG SULF 17.5-3.13-1.6 GM/177ML PO SOLN: 1.0000 | Freq: Once | ORAL | 0 refills | Status: AC

## 2022-10-13 NOTE — H&P (View-Only) (Signed)
HISTORY OF PRESENT ILLNESS:  Timothy [name] is a 66 y.o. male, retired automotive mechanic, with coronary artery disease with prior coronary artery stent placement on Brilinta, oxygen requiring COPD secondary to long-term tobacco abuse, bladder cancer, hypertension, GERD, hyperlipidemia who sent today by his primary care physician regarding positive Cologuard testing.  Patient denies prior GI evaluations or colonoscopy.  No family history of colon cancer or other GI malignancy.  He underwent Cologuard testing March 2024 which returned positive.  His GI review of systems is remarkable for bloating which affects his breathing.  He does have reflux for which she takes Pepcid.  No dysphagia.  His weight has been stable.  Review of blood work from July 27, 2022 shows unremarkable comprehensive metabolic panel.  Unremarkable CBC with hemoglobin 17.0.  The patient's cardiologist is Dr. Thomas Kelly.  The patient brings along with him a letter from Dr. Kelly's office stating that he may proceed with colonoscopy procedure from a cardiac perspective.  As well, it indicates that he may hold his Brilinta for 5 days prior to the procedure and resume thereafter at the discretion of his GI doctor.  He should continue on aspirin throughout.  REVIEW OF SYSTEMS:  All non-GI ROS negative unless otherwise stated in the HPI.  Past Medical History:  Diagnosis Date   Altered mental status, improved at discharge 06/19/2013   Arthritis    Bladder cancer (HCC) 2018   Bronchitis, chronic (HCC) 06/19/2013   CAD (coronary artery disease)    a. s/p cardiac arrest in 06/2013 with DES to RCA b. 02/2020: Inferior STEMI and cath showing total occlusion of mid-RCA treated with DES placement.    Cancer (HCC)    bladder   Cardiac arrest, hypothermic protocol 06/12/2013   COPD exacerbation (HCC) 06/12/2013   GERD (gastroesophageal reflux disease)    Hx of leukocytosis    Hyperlipidemia LDL goal < 70 06/19/2013    Hypokalemia, replaced 06/19/2013   Hypomagnesemia, replaced 06/19/2013   Hypotension, requiring levophed 06/19/2013   NSTEMI (non-ST elevated myocardial infarction), DES to RCA 06/13/2013   Pre-diabetes    Tobacco abuse 06/19/2013    Past Surgical History:  Procedure Laterality Date   cardiac sstent     CORONARY ANGIOPLASTY WITH STENT PLACEMENT  06/12/2013   Xience Alpine DES to RCA, NSTEMI   CORONARY STENT INTERVENTION N/A 02/24/2020   Procedure: CORONARY STENT INTERVENTION;  Surgeon: Smith, Henry W, MD;  [facility];  Service: Cardiovascular;  Laterality: N/A;   CORONARY/GRAFT ACUTE MI REVASCULARIZATION N/A 02/24/2020   Procedure: Coronary/Graft Acute MI Revascularization;  Surgeon: Smith, Henry W, MD;  [facility]: [facility];  Service: Cardiovascular;  Laterality: N/A;   CYSTOSCOPY W/ RETROGRADES Left 03/29/2017   Procedure: CYSTOSCOPY WITH LEFT RETROGRADE PYELOGRAM;  Surgeon: Ottelin, Mark, MD;  Location: [facility];  Service: Urology;  Laterality: Left;   CYSTOSCOPY WITH DIRECT VISION INTERNAL URETHROTOMY N/A 09/15/2022   Procedure: CYSTOSCOPY WITH BALLOON DILATION;  Surgeon: Hall, Marshall C, MD;  Location: [facility];  Service: Urology;  Laterality: N/A;   LEFT HEART CATH AND CORONARY ANGIOGRAPHY N/A 02/24/2020   Procedure: LEFT HEART CATH AND CORONARY ANGIOGRAPHY;  Surgeon: Smith, Henry W, MD;  [facility];  Service: Cardiovascular;  Laterality: N/A;   LEFT HEART CATHETERIZATION WITH CORONARY ANGIOGRAM N/A 06/12/2013   Procedure: LEFT HEART CATHETERIZATION WITH CORONARY ANGIOGRAM;  Surgeon: Thomas A Kelly, MD;  Location: [facility];  Service: Cardiovascular;  Laterality: N/A;   TRANSURETHRAL   RESECTION OF BLADDER TUMOR N/A 03/29/2017   Procedure: TRANSURETHRAL RESECTION OF BLADDER TUMOR (TURBT)/;  Surgeon: Ottelin, Mark, MD;  Location: [facility];  Service: Urology;  Laterality: N/A;   transurethral ressection of bladder tumor     Dr. Ottelin 03/29/17     Social History Timothy [name]  reports that he quit smoking about 9 years ago. His smoking use included cigarettes. He has a 76.00 pack-year smoking history. He has never used smokeless tobacco. He reports that he does not drink alcohol and does not use drugs.  family history includes Hypertension in an other family member.  Allergies  Allergen Reactions   Ace Inhibitors Other (See Comments)    Caused wheezing   Aspirin Swelling and Other (See Comments)    Lips swell- still takes approx 2 times a week in 2021 Low dose ASA is fine, no problems   Ibuprofen Swelling and Other (See Comments)    Lips swell   Losartan Other (See Comments)    Leg cramps       PHYSICAL EXAMINATION: Vital signs: BP 128/74   Pulse 85   Ht 5' 6" (1.676 m)   Wt 119 lb (54 kg)   BMI 19.21 kg/m   Constitutional: Thin chronically ill-appearing, no acute distress Psychiatric: Pleasant, alert and oriented x3, cooperative Eyes: extraocular movements intact, anicteric, conjunctiva pink Mouth: oral pharynx moist, no lesions.  Poor dentition Neck: supple no lymphadenopathy Cardiovascular: heart regular rate and rhythm, no murmur Lungs: clear to auscultation bilaterally.  Distant breath sounds Abdomen: soft, nontender, nondistended, no obvious ascites, no peritoneal signs, normal bowel sounds, no organomegaly Rectal: Deferred until colonoscopy Extremities: no clubbing, cyanosis, or lower extremity edema bilaterally Skin: no lesions on visible extremities except for multiple ecchymoses Neuro: No focal deficits.  Cranial nerves intact  ASSESSMENT:  1.  Positive Cologuard testing.  Rule out colorectal neoplasia. 2.  Oxygen requiring COPD 3.  Coronary artery disease on Brilinta 4.  Multiple other general medical problems   PLAN:  1.  Colonoscopy.  The patient is HIGH RISK due to his comorbidities and antiplatelet therapy.  Due to his oxygen requiring COPD, his examination will need to be performed at  the hospital with monitored anesthesia care.  The nature of the procedure, as well as the risks, benefits, and alternatives were carefully and thoroughly reviewed with the patient. Ample time for discussion and questions allowed. The patient understood, was satisfied, and agreed to proceed. 2.  Hold Brilinta 5 days prior to the procedure.  Continue aspirin throughout. 3.  Ongoing general medical care with PCP, Dr. Johnson      

## 2022-10-13 NOTE — Patient Instructions (Signed)
You have been scheduled for a colonoscopy. Please follow written instructions given to you at your visit today.  Please pick up your prep supplies at the pharmacy within the next 1-3 days. If you use inhalers (even only as needed), please bring them with you on the day of your procedure.  _______________________________________________________  If your blood pressure at your visit was 140/90 or greater, please contact your primary care physician to follow up on this.  _______________________________________________________  If you are age 67 or older, your body mass index should be between 23-30. Your Body mass index is 19.21 kg/m. If this is out of the aforementioned range listed, please consider follow up with your Primary Care Provider.  If you are age 45 or younger, your body mass index should be between 19-25. Your Body mass index is 19.21 kg/m. If this is out of the aformentioned range listed, please consider follow up with your Primary Care Provider.   ________________________________________________________  The [facility] providers would like to encourage you to use [facility] to communicate with providers for non-urgent requests or questions.  Due to long hold times on the telephone, sending your provider a message by [facility] may be a faster and more efficient way to get a response.  Please allow 48 business hours for a response.  Please remember that this is for non-urgent requests.  _______________________________________________________

## 2022-10-13 NOTE — Progress Notes (Signed)
HISTORY OF PRESENT ILLNESS:  Timothy [name] is a 67 y.o. male, retired Librarian, academic, with coronary artery disease with prior coronary artery stent placement on Brilinta, oxygen requiring COPD secondary to long-term tobacco abuse, bladder cancer, hypertension, GERD, hyperlipidemia who sent today by his primary care physician regarding positive Cologuard testing.  Patient denies prior GI evaluations or colonoscopy.  No family history of colon cancer or other GI malignancy.  He underwent Cologuard testing March 2024 which returned positive.  His GI review of systems is remarkable for bloating which affects his breathing.  He does have reflux for which she takes Pepcid.  No dysphagia.  His weight has been stable.  Review of blood work from July 27, 2022 shows unremarkable comprehensive metabolic panel.  Unremarkable CBC with hemoglobin 17.0.  The patient's cardiologist is Dr. Nicki Guadalajara.  The patient brings along with him a letter from Dr. Landry Dyke office stating that he may proceed with colonoscopy procedure from a cardiac perspective.  As well, it indicates that he may hold his Brilinta for 5 days prior to the procedure and resume thereafter at the discretion of his GI doctor.  He should continue on aspirin throughout.  REVIEW OF SYSTEMS:  All non-GI ROS negative unless otherwise stated in the HPI.  Past Medical History:  Diagnosis Date   Altered mental status, improved at discharge 06/19/2013   Arthritis    Bladder cancer (HCC) 2018   Bronchitis, chronic (HCC) 06/19/2013   CAD (coronary artery disease)    a. s/p cardiac arrest in 06/2013 with DES to RCA b. 02/2020: Inferior STEMI and cath showing total occlusion of mid-RCA treated with DES placement.    Cancer [facility])    bladder   Cardiac arrest, hypothermic protocol 06/12/2013   COPD exacerbation (HCC) 06/12/2013   GERD (gastroesophageal reflux disease)    Hx of leukocytosis    Hyperlipidemia LDL goal < 70 06/19/2013    Hypokalemia, replaced 06/19/2013   Hypomagnesemia, replaced 06/19/2013   Hypotension, requiring levophed 06/19/2013   NSTEMI (non-ST elevated myocardial infarction), DES to RCA 06/13/2013   Pre-diabetes    Tobacco abuse 06/19/2013    Past Surgical History:  Procedure Laterality Date   cardiac sstent     CORONARY ANGIOPLASTY WITH STENT PLACEMENT  06/12/2013   Xience Alpine DES to RCA, NSTEMI   CORONARY STENT INTERVENTION N/A 02/24/2020   Procedure: CORONARY STENT INTERVENTION;  Surgeon: Lyn Records, MD;  [facility]: [facility];  Service: Cardiovascular;  Laterality: N/A;   CORONARY/GRAFT ACUTE MI REVASCULARIZATION N/A 02/24/2020   Procedure: Coronary/Graft Acute MI Revascularization;  Surgeon: Lyn Records, MD;  Location: [facility];  Service: Cardiovascular;  Laterality: N/A;   CYSTOSCOPY W/ RETROGRADES Left 03/29/2017   Procedure: CYSTOSCOPY WITH LEFT RETROGRADE PYELOGRAM;  Surgeon: Ihor Gully, MD;  Location: [facility];  Service: Urology;  Laterality: Left;   CYSTOSCOPY WITH DIRECT VISION INTERNAL URETHROTOMY N/A 09/15/2022   Procedure: CYSTOSCOPY WITH BALLOON DILATION;  Surgeon: Joline Maxcy, MD;  Location: [facility];  Service: Urology;  Laterality: N/A;   LEFT HEART CATH AND CORONARY ANGIOGRAPHY N/A 02/24/2020   Procedure: LEFT HEART CATH AND CORONARY ANGIOGRAPHY;  Surgeon: Lyn Records, MD;  [facility]: [facility];  Service: Cardiovascular;  Laterality: N/A;   LEFT HEART CATHETERIZATION WITH CORONARY ANGIOGRAM N/A 06/12/2013   Procedure: LEFT HEART CATHETERIZATION WITH CORONARY ANGIOGRAM;  Surgeon: Lennette Bihari, MD;  Location: [facility] CATH LAB;  Service: Cardiovascular;  Laterality: N/A;   TRANSURETHRAL  RESECTION OF BLADDER TUMOR N/A 03/29/2017   Procedure: TRANSURETHRAL RESECTION OF BLADDER TUMOR (TURBT)/;  Surgeon: Ihor Gully, MD;  Location: [facility];  Service: Urology;  Laterality: N/A;   transurethral ressection of bladder tumor     Dr. Vernie Ammons 03/29/17     Social History [name]  reports that he quit smoking about 9 years ago. His smoking use included cigarettes. He has a 76.00 pack-year smoking history. He has never used smokeless tobacco. He reports that he does not drink alcohol and does not use drugs.  family history includes Hypertension in an other family member.  Allergies  Allergen Reactions   Ace Inhibitors Other (See Comments)    Caused wheezing   Aspirin Swelling and Other (See Comments)    Lips swell- still takes approx 2 times a week in 2021 Low dose ASA is fine, no problems   Ibuprofen Swelling and Other (See Comments)    Lips swell   Losartan Other (See Comments)    Leg cramps       PHYSICAL EXAMINATION: Vital signs: BP 128/74   Pulse 85   Ht 5\' 6"  (1.676 m)   Wt 119 lb (54 kg)   BMI 19.21 kg/m   Constitutional: Thin chronically ill-appearing, no acute distress Psychiatric: Pleasant, alert and oriented x3, cooperative Eyes: extraocular movements intact, anicteric, conjunctiva pink Mouth: oral pharynx moist, no lesions.  Poor dentition Neck: supple no lymphadenopathy Cardiovascular: heart regular rate and rhythm, no murmur Lungs: clear to auscultation bilaterally.  Distant breath sounds Abdomen: soft, nontender, nondistended, no obvious ascites, no peritoneal signs, normal bowel sounds, no organomegaly Rectal: Deferred until colonoscopy Extremities: no clubbing, cyanosis, or lower extremity edema bilaterally Skin: no lesions on visible extremities except for multiple ecchymoses Neuro: No focal deficits.  Cranial nerves intact  ASSESSMENT:  1.  Positive Cologuard testing.  Rule out colorectal neoplasia. 2.  Oxygen requiring COPD 3.  Coronary artery disease on Brilinta 4.  Multiple other general medical problems   PLAN:  1.  Colonoscopy.  The patient is HIGH RISK due to his comorbidities and antiplatelet therapy.  Due to his oxygen requiring COPD, his examination will need to be performed at  the hospital with monitored anesthesia care.  The nature of the procedure, as well as the risks, benefits, and alternatives were carefully and thoroughly reviewed with the patient. Ample time for discussion and questions allowed. The patient understood, was satisfied, and agreed to proceed. 2.  Hold Brilinta 5 days prior to the procedure.  Continue aspirin throughout. 3.  Ongoing general medical care with PCP, Dr. Laural Benes

## 2022-10-22 ENCOUNTER — Ambulatory Visit: Payer: Medicare Other | Admitting: Physician Assistant

## 2022-10-26 ENCOUNTER — Ambulatory Visit: Payer: Medicare Other | Attending: Internal Medicine | Admitting: Internal Medicine

## 2022-10-26 ENCOUNTER — Encounter: Payer: Self-pay | Admitting: Internal Medicine

## 2022-10-26 VITALS — BP 108/63 | HR 75 | Temp 98.0°F | Ht 66.0 in | Wt 119.0 lb

## 2022-10-26 DIAGNOSIS — D692 Other nonthrombocytopenic purpura: Secondary | ICD-10-CM

## 2022-10-26 DIAGNOSIS — I10 Essential (primary) hypertension: Secondary | ICD-10-CM | POA: Diagnosis not present

## 2022-10-26 DIAGNOSIS — J449 Chronic obstructive pulmonary disease, unspecified: Secondary | ICD-10-CM

## 2022-10-26 DIAGNOSIS — I25118 Atherosclerotic heart disease of native coronary artery with other forms of angina pectoris: Secondary | ICD-10-CM

## 2022-10-26 DIAGNOSIS — R972 Elevated prostate specific antigen [PSA]: Secondary | ICD-10-CM

## 2022-10-26 DIAGNOSIS — Z23 Encounter for immunization: Secondary | ICD-10-CM

## 2022-10-26 DIAGNOSIS — R195 Other fecal abnormalities: Secondary | ICD-10-CM

## 2022-10-26 MED ORDER — ALBUTEROL SULFATE HFA 108 (90 BASE) MCG/ACT IN AERS
2.0000 | INHALATION_SPRAY | Freq: Four times a day (QID) | RESPIRATORY_TRACT | 12 refills | Status: AC | PRN
Start: 2022-10-26 — End: ?

## 2022-10-26 NOTE — Progress Notes (Signed)
Patient ID: Timothy [name], male    DOB: November 04, 1955  MRN: [id number]  CC: Hypertension (HTN f/u. Med refill. /No questions / concerns/Pt already scheduled for colonscopy next month. Shingles rx at home. )   Subjective: Timothy [name] is a 67 y.o. male who presents for chronic ds management His concerns today include:  hx of HTN, cardiac arrest vfib/vtach 06/2013, CAD with stent to RCA, COPD Gold stage III, former smoker, pre-DM, lung nodule on CT 08/2015, Elev PSA (neg prostate bx 07/2022) and bladder CA.   Pos Cologuard:  scheduled for c-scope 11/12/2022  Elev PSA/Hx of bladder CA:  had neg prostate bx. he also reports having had cystoscopy for surveillance of bladder cancer.  CAD/HTN:  occasional CP that short lasting.  He does not use sublingual nitroglycerin on these occasions.  No shortness of breath or lower extremity edema.  Compliant with Lipitor 80 mg daily, Brilinta, aspirin 81 mg Norvasc 5 mg daily.  Bruises easily on Brilinta and aspirin.  COPD: still using O2 at nights.  Without it he wakes up with sweat and can not breath Using Breztri and Albuterol BID; no recent COPD flare.  Leukocytosis: Saw Dr. Leonides Schanz 07/2022.  Negative JAK2 and BCR/ABL Has f/u appt in 01/2023  HM:  did not get shingrix as yet because he had so much going on in the past several months.  Still has prescription at home.  Plans to get it done in the near future. Patient Active Problem List   Diagnosis Date Noted   Influenza A 04/09/2021   Acute on chronic respiratory failure with hypoxia (HCC) 04/09/2021   COPD exacerbation (HCC) 04/03/2021   COPD (chronic obstructive pulmonary disease) (HCC) 04/02/2021   Exercise hypoxemia 07/02/2020   Acute STEMI of inferior wall  02/24/2020   Malignant neoplasm of urinary bladder (HCC) 02/23/2017   Lung nodule 11/20/2016   Gastroesophageal reflux disease without esophagitis 11/20/2016   Prediabetes 12/29/2013   Dental cavities 12/29/2013   DOE (dyspnea on exertion)  08/30/2013   Essential hypertension 08/03/2013   CAD S/P percutaneous coronary angioplasty 06/19/2013   Hyperlipidemia with target LDL less than 70 06/19/2013   Former cigarette smoker 06/19/2013   NSTEMI (non-ST elevated myocardial infarction), DES to RCA 06/13/2013   COPD GOLD III 06/12/2013     Current Outpatient Medications on File Prior to Visit  Medication Sig Dispense Refill   acetaminophen (TYLENOL) 500 MG tablet Take 500-1,000 mg by mouth every 8 (eight) hours as needed for mild pain or headache.      amLODipine (NORVASC) 5 MG tablet Take 1 tablet (5 mg total) by mouth daily. 90 tablet 1   aspirin EC 81 MG tablet Take 1 tablet (81 mg total) by mouth daily. 90 tablet 2   atorvastatin (LIPITOR) 80 MG tablet Take 1 tablet (80 mg total) by mouth at bedtime. 90 tablet 3   bisoprolol (ZEBETA) 5 MG tablet TAKE 1/2 TABLET(2.5 MG) BY MOUTH AT BEDTIME 45 tablet 2   BREZTRI AEROSPHERE 160-9-4.8 MCG/ACT AERO INHALE 2 PUFFS INTO THE LUNGS IN THE MORNING AND AT BEDTIME 10.7 g 5   famotidine (PEPCID) 20 MG tablet Take 20 mg by mouth daily as needed for heartburn or indigestion.     finasteride (PROSCAR) 5 MG tablet Take 1 tablet (5 mg total) by mouth daily. 90 tablet 3   nitroGLYCERIN (NITROSTAT) 0.4 MG SL tablet Place 1 tablet (0.4 mg total) under the tongue every 5 (five) minutes as needed for chest pain. NEED  OV. 25 tablet 2   OXYGEN Inhale 2 L into the lungs at bedtime.     ticagrelor (BRILINTA) 90 MG TABS tablet TAKE 1 TABLET(90 MG) BY MOUTH TWICE DAILY 180 tablet 3   No current facility-administered medications on file prior to visit.    Allergies  Allergen Reactions   Ace Inhibitors Other (See Comments)    Caused wheezing   Aspirin Swelling and Other (See Comments)    Lips swell- still takes approx 2 times a week in 2021 Low dose ASA is fine, no problems   Ibuprofen Swelling and Other (See Comments)    Lips swell   Losartan Other (See Comments)    Leg cramps    Social History    Socioeconomic History   Marital status: Divorced    Spouse name: Not on file   Number of children: Not on file   Years of education: Not on file   Highest education level: Not on file  Occupational History   Not on file  Tobacco Use   Smoking status: Former    Packs/day: 2.00    Years: 38.00    Additional pack years: 0.00    Total pack years: 76.00    Types: Cigarettes    Quit date: 07/10/2013    Years since quitting: 9.3   Smokeless tobacco: Never  Vaping Use   Vaping Use: Never used  Substance and Sexual Activity   Alcohol use: No   Drug use: No    Types: Marijuana   Sexual activity: Yes  Other Topics Concern   Not on file  Social History Narrative   Not on file   Social Determinants of Health   Financial Resource Strain: Low Risk  (05/28/2022)   Overall Financial Resource Strain (CARDIA)    Difficulty of Paying Living Expenses: Not hard at all  Food Insecurity: No Food Insecurity (05/28/2022)   Hunger Vital Sign    Worried About Running Out of Food in the Last Year: Never true    Ran Out of Food in the Last Year: Never true  Transportation Needs: No Transportation Needs (05/28/2022)   PRAPARE - Administrator, Civil Service (Medical): No    Lack of Transportation (Non-Medical): No  Physical Activity: Inactive (05/28/2022)   Exercise Vital Sign    Days of Exercise per Week: 0 days    Minutes of Exercise per Session: 0 min  Stress: No Stress Concern Present (05/28/2022)   [employer] of Occupational Health - Occupational Stress Questionnaire    Feeling of Stress : Not at all  Social Connections: Not on file  Intimate Partner Violence: Not on file    Family History  Problem Relation Age of Onset   Hypertension Other    Colon cancer Neg Hx    Stomach cancer Neg Hx    Esophageal cancer Neg Hx     Past Surgical History:  Procedure Laterality Date   cardiac sstent     CORONARY ANGIOPLASTY WITH STENT PLACEMENT  06/12/2013   Xience Alpine DES  to RCA, NSTEMI   CORONARY STENT INTERVENTION N/A 02/24/2020   Procedure: CORONARY STENT INTERVENTION;  Surgeon: Lyn Records, MD;  [facility];  Service: Cardiovascular;  Laterality: N/A;   CORONARY/GRAFT ACUTE MI REVASCULARIZATION N/A 02/24/2020   Procedure: Coronary/Graft Acute MI Revascularization;  Surgeon: Lyn Records, MD;  [facility]: [facility];  Service: Cardiovascular;  Laterality: N/A;   CYSTOSCOPY W/ RETROGRADES Left 03/29/2017   Procedure: CYSTOSCOPY  WITH LEFT RETROGRADE PYELOGRAM;  Surgeon: Ihor Gully, MD;  Location: [facility];  Service: Urology;  Laterality: Left;   CYSTOSCOPY WITH DIRECT VISION INTERNAL URETHROTOMY N/A 09/15/2022   Procedure: CYSTOSCOPY WITH BALLOON DILATION;  Surgeon: Joline Maxcy, MD;  Location: [facility];  Service: Urology;  Laterality: N/A;   LEFT HEART CATH AND CORONARY ANGIOGRAPHY N/A 02/24/2020   Procedure: LEFT HEART CATH AND CORONARY ANGIOGRAPHY;  Surgeon: Lyn Records, MD;  Location: [facility];  Service: Cardiovascular;  Laterality: N/A;   LEFT HEART CATHETERIZATION WITH CORONARY ANGIOGRAM N/A 06/12/2013   Procedure: LEFT HEART CATHETERIZATION WITH CORONARY ANGIOGRAM;  Surgeon: Lennette Bihari, MD;  Location: [facility] CATH LAB;  Service: Cardiovascular;  Laterality: N/A;   TRANSURETHRAL RESECTION OF BLADDER TUMOR N/A 03/29/2017   Procedure: TRANSURETHRAL RESECTION OF BLADDER TUMOR (TURBT)/;  Surgeon: Ihor Gully, MD;  Location: [facility];  Service: Urology;  Laterality: N/A;   transurethral ressection of bladder tumor     Dr. Vernie Ammons 03/29/17    ROS: Review of Systems Negative except as stated above  PHYSICAL EXAM: BP 108/63 (BP Location: Left Arm, Patient Position: Sitting, Cuff Size: Normal)   Pulse 75   Temp 98 F (36.7 C) (Oral)   Ht 5\' 6"  (1.676 m)   Wt 119 lb (54 kg)   SpO2 94%   BMI 19.21 kg/m   Physical Exam   General appearance - alert, well appearing, and in no distress Mental status - normal mood,  behavior, speech, dress, motor activity, and thought processes Neck - supple, no significant adenopathy Chest - clear to auscultation, no wheezes, rales or rhonchi, symmetric air entry Heart - normal rate, regular rhythm, normal S1, S2, no murmurs, rubs, clicks or gallops Extremities - peripheral pulses normal, no pedal edema, no clubbing or cyanosis Skin -resolving ecchymosis on both elbows.  Smaller ecchymosis patches on the forearms.     Latest Ref Rng & Units 09/07/2022    1:33 PM 07/27/2022    2:27 PM 06/25/2022   12:32 PM  CMP  Glucose 70 - 99 mg/dL 366  440  93   BUN 8 - 23 mg/dL 12  13  10    Creatinine 0.61 - 1.24 mg/dL 3.47  4.25  9.56   Sodium 135 - 145 mmol/L 140  138  140   Potassium 3.5 - 5.1 mmol/L 4.4  4.4  4.9   Chloride 98 - 111 mmol/L 104  101  99   CO2 22 - 32 mmol/L 27  28  26    Calcium 8.9 - 10.3 mg/dL 9.6  38.7  56.4   Total Protein 6.5 - 8.1 g/dL  8.1  7.3   Total Bilirubin 0.3 - 1.2 mg/dL  0.8  0.4   Alkaline Phos 38 - 126 U/L  90  89   AST 15 - 41 U/L  15  16   ALT 0 - 44 U/L  18  17    Lipid Panel     Component Value Date/Time   CHOL 279 (H) 06/25/2022 1232   TRIG 180 (H) 06/25/2022 1232   HDL 57 06/25/2022 1232   CHOLHDL 4.9 06/25/2022 1232   CHOLHDL 5.6 02/24/2020 1253   VLDL 42 (H) 02/24/2020 1253   LDLCALC 188 (H) 06/25/2022 1232    CBC    Component Value Date/Time   WBC 15.0 (H) 09/07/2022 1333   RBC 5.50 09/07/2022 1333   HGB 16.6 09/07/2022 1333   HGB 17.0 07/27/2022 1427   HGB  16.9 06/25/2022 1232   HCT 50.6 09/07/2022 1333   HCT 50.6 06/25/2022 1232   PLT 477 (H) 09/07/2022 1333   PLT 411 (H) 07/27/2022 1427   PLT 436 06/25/2022 1232   MCV 92.0 09/07/2022 1333   MCV 91 06/25/2022 1232   MCH 30.2 09/07/2022 1333   MCHC 32.8 09/07/2022 1333   RDW 13.1 09/07/2022 1333   RDW 12.5 06/25/2022 1232   LYMPHSABS 2.4 07/27/2022 1427   LYMPHSABS 1.7 04/21/2021 1140   MONOABS 1.7 (H) 07/27/2022 1427   EOSABS 0.3 07/27/2022 1427   EOSABS  0.1 04/21/2021 1140   BASOSABS 0.1 07/27/2022 1427   BASOSABS 0.1 04/21/2021 1140    ASSESSMENT AND PLAN:  1. Essential hypertension At goal.  Continue amlodipine 5 mg daily and bisoprolol 5 mg 1/2  daily.  2. COPD GOLD III Stable.  Continue Breztri and albuterol inhaler Patient on O2 at nights. - albuterol (VENTOLIN HFA) 108 (90 Base) MCG/ACT inhaler; Inhale 2 puffs into the lungs every 6 (six) hours as needed for wheezing or shortness of breath.  Dispense: 8 g; Refill: 12  3. Coronary artery disease of native artery of native heart with stable angina pectoris (HCC) Stable.  Continue aspirin, Brilinta, atorvastatin 80 mg daily and bisoprolol 5 mg half tab daily  4. Elevated PSA Followed by urology.  Biopsy negative.  5. Positive colorectal cancer screening using Cologuard test Schedule for colonoscopy  6. Senile purpura (HCC) Due to aspirin and Brilinta.  No active bleeding  7. Need for shingles vaccine Encourage patient to get the shingles vaccine series.  He has prescription at home that was given to him on last visit.    Patient was given the opportunity to ask questions.  Patient verbalized understanding of the plan and was able to repeat key elements of the plan.   This documentation was completed using Paediatric nurse.  Any transcriptional errors are unintentional.  No orders of the defined types were placed in this encounter.    Requested Prescriptions   Signed Prescriptions Disp Refills   albuterol (VENTOLIN HFA) 108 (90 Base) MCG/ACT inhaler 8 g 12    Sig: Inhale 2 puffs into the lungs every 6 (six) hours as needed for wheezing or shortness of breath.    Return in about 4 months (around 02/25/2023).  Jonah Blue, MD, FACP

## 2022-10-29 ENCOUNTER — Encounter: Payer: Self-pay | Admitting: Internal Medicine

## 2022-11-10 NOTE — Progress Notes (Signed)
Attempted to obtain medical history via telephone, unable to reach at this time. HIPAA compliant voicemail message left requesting return call to pre surgical testing department. 

## 2022-11-11 NOTE — Anesthesia Preprocedure Evaluation (Signed)
Anesthesia Evaluation  Patient identified by MRN, date of birth, ID band Patient awake    Reviewed: Allergy & Precautions, NPO status , Patient's Chart, lab work & pertinent test results  Airway Mallampati: II  TM Distance: >3 FB Neck ROM: Full    Dental no notable dental hx. (+) Teeth Intact, Dental Advisory Given, Chipped,    Pulmonary COPD (on 2L o2 at night),  oxygen dependent, former smoker   Pulmonary exam normal breath sounds clear to auscultation       Cardiovascular Exercise Tolerance: Poor hypertension, + CAD (s/p cardiac arrest in 06/2013 with DES to RCA b. 02/2020: Inferior STEMI and cath showing total occlusion of mid-RCA treated with DES placement.), + Past MI (NSTEMI (non-ST elevated myocardial infarction), DES to RCA 06/13/2013) and + DOE  Normal cardiovascular exam Rhythm:Regular Rate:Normal  02/2022 Echo 1. Left ventricular ejection fraction, by estimation, is 55 to 60%. The  left ventricle has normal function. The left ventricle has no regional  wall motion abnormalities. Left ventricular diastolic parameters were  normal.   2. Right ventricular systolic function is normal. The right ventricular  size is normal. There is normal pulmonary artery systolic pressure.   3. The mitral valve is normal in structure. Trivial mitral valve  regurgitation. No evidence of mitral stenosis.   4. The aortic valve is tricuspid. There is mild calcification of the  aortic valve. Aortic valve regurgitation is not visualized. Mild aortic  valve sclerosis is present, with no evidence of aortic valve stenosis.   5. The inferior vena cava is normal in size with greater than 50%  respiratory variability, suggesting right atrial pressure of 3 mmHg.      Neuro/Psych  negative psych ROS   GI/Hepatic Neg liver ROS,GERD  ,,  Endo/Other  negative endocrine ROS    Renal/GU negative Renal ROS     Musculoskeletal  (+) Arthritis ,     Abdominal   Peds  Hematology   Anesthesia Other Findings All: asa, ace, ibuprofen ,losartan  Reproductive/Obstetrics                             Anesthesia Physical Anesthesia Plan  ASA: 3  Anesthesia Plan: MAC   Post-op Pain Management:    Induction: Intravenous  PONV Risk Score and Plan: Treatment may vary due to age or medical condition and Propofol infusion  Airway Management Planned: Natural Airway and Nasal Cannula  Additional Equipment: None  Intra-op Plan:   Post-operative Plan:   Informed Consent: I have reviewed the patients History and Physical, chart, labs and discussed the procedure including the risks, benefits and alternatives for the proposed anesthesia with the patient or authorized representative who has indicated his/her understanding and acceptance.     Dental advisory given  Plan Discussed with:   Anesthesia Plan Comments: (+ cologard for colonoscopy)        Anesthesia Quick Evaluation

## 2022-11-12 ENCOUNTER — Encounter (HOSPITAL_COMMUNITY): Payer: Self-pay | Admitting: Internal Medicine

## 2022-11-12 ENCOUNTER — Ambulatory Visit (HOSPITAL_COMMUNITY)
Admission: RE | Admit: 2022-11-12 | Discharge: 2022-11-12 | Disposition: A | Discharge status: Home / Self Care | Payer: Medicare Other | Attending: Internal Medicine | Admitting: Internal Medicine

## 2022-11-12 ENCOUNTER — Encounter (HOSPITAL_COMMUNITY)
Admission: RE | Disposition: A | Discharge status: Home / Self Care | Payer: Self-pay | Source: Home / Self Care | Attending: Internal Medicine

## 2022-11-12 ENCOUNTER — Ambulatory Visit (HOSPITAL_COMMUNITY): Payer: Self-pay | Admitting: Certified Registered Nurse Anesthetist

## 2022-11-12 ENCOUNTER — Ambulatory Visit (HOSPITAL_BASED_OUTPATIENT_CLINIC_OR_DEPARTMENT_OTHER): Payer: Medicare Other | Admitting: Certified Registered Nurse Anesthetist

## 2022-11-12 ENCOUNTER — Other Ambulatory Visit: Payer: Self-pay

## 2022-11-12 DIAGNOSIS — I10 Essential (primary) hypertension: Secondary | ICD-10-CM | POA: Insufficient documentation

## 2022-11-12 DIAGNOSIS — J449 Chronic obstructive pulmonary disease, unspecified: Secondary | ICD-10-CM | POA: Diagnosis not present

## 2022-11-12 DIAGNOSIS — I2510 Atherosclerotic heart disease of native coronary artery without angina pectoris: Secondary | ICD-10-CM | POA: Diagnosis not present

## 2022-11-12 DIAGNOSIS — K219 Gastro-esophageal reflux disease without esophagitis: Secondary | ICD-10-CM | POA: Insufficient documentation

## 2022-11-12 DIAGNOSIS — R195 Other fecal abnormalities: Secondary | ICD-10-CM | POA: Diagnosis not present

## 2022-11-12 DIAGNOSIS — Z87891 Personal history of nicotine dependence: Secondary | ICD-10-CM | POA: Diagnosis not present

## 2022-11-12 DIAGNOSIS — I252 Old myocardial infarction: Secondary | ICD-10-CM | POA: Diagnosis not present

## 2022-11-12 DIAGNOSIS — Z9981 Dependence on supplemental oxygen: Secondary | ICD-10-CM | POA: Diagnosis not present

## 2022-11-12 DIAGNOSIS — D122 Benign neoplasm of ascending colon: Secondary | ICD-10-CM

## 2022-11-12 DIAGNOSIS — Z955 Presence of coronary angioplasty implant and graft: Secondary | ICD-10-CM | POA: Insufficient documentation

## 2022-11-12 DIAGNOSIS — J441 Chronic obstructive pulmonary disease with (acute) exacerbation: Secondary | ICD-10-CM | POA: Diagnosis not present

## 2022-11-12 DIAGNOSIS — Z8551 Personal history of malignant neoplasm of bladder: Secondary | ICD-10-CM | POA: Insufficient documentation

## 2022-11-12 DIAGNOSIS — Z1211 Encounter for screening for malignant neoplasm of colon: Secondary | ICD-10-CM

## 2022-11-12 DIAGNOSIS — K635 Polyp of colon: Secondary | ICD-10-CM | POA: Diagnosis not present

## 2022-11-12 DIAGNOSIS — Z8674 Personal history of sudden cardiac arrest: Secondary | ICD-10-CM | POA: Diagnosis not present

## 2022-11-12 DIAGNOSIS — Z7902 Long term (current) use of antithrombotics/antiplatelets: Secondary | ICD-10-CM | POA: Insufficient documentation

## 2022-11-12 DIAGNOSIS — D126 Benign neoplasm of colon, unspecified: Secondary | ICD-10-CM

## 2022-11-12 HISTORY — PX: POLYPECTOMY: SHX5525

## 2022-11-12 HISTORY — PX: COLONOSCOPY WITH PROPOFOL: SHX5780

## 2022-11-12 SURGERY — COLONOSCOPY WITH PROPOFOL
Anesthesia: Monitor Anesthesia Care

## 2022-11-12 MED ORDER — PHENYLEPHRINE 80 MCG/ML (10ML) SYRINGE FOR IV PUSH (FOR BLOOD PRESSURE SUPPORT)
PREFILLED_SYRINGE | INTRAVENOUS | Status: DC | PRN
  Administered 2022-11-12: 120 ug via INTRAVENOUS

## 2022-11-12 MED ORDER — PROPOFOL 10 MG/ML IV BOLUS
INTRAVENOUS | Status: DC | PRN
  Administered 2022-11-12 (×2): 20 mg via INTRAVENOUS
  Administered 2022-11-12: 30 mg via INTRAVENOUS

## 2022-11-12 MED ORDER — LACTATED RINGERS IV SOLN: INTRAVENOUS | Status: DC

## 2022-11-12 MED ORDER — PROPOFOL 500 MG/50ML IV EMUL
INTRAVENOUS | Status: DC | PRN
  Administered 2022-11-12: 125 ug/kg/min via INTRAVENOUS

## 2022-11-12 MED ORDER — PROPOFOL 1000 MG/100ML IV EMUL
INTRAVENOUS | Status: AC
  Filled 2022-11-12: qty 100

## 2022-11-12 SURGICAL SUPPLY — 22 items

## 2022-11-12 NOTE — Transfer of Care (Signed)
Immediate Anesthesia Transfer of Care Note  Patient: Timothy [name]  Procedure(s) Performed: COLONOSCOPY WITH PROPOFOL POLYPECTOMY  Patient Location: Endoscopy Unit  Anesthesia Type:MAC  Level of Consciousness: drowsy  Airway & Oxygen Therapy: Patient Spontanous Breathing and Patient connected to face mask  Post-op Assessment: Report given to RN and Post -op Vital signs reviewed and stable  Post vital signs: Reviewed and stable  Last Vitals:  Vitals Value Taken Time  BP    Temp    Pulse 79 11/12/22 0922  Resp 33 11/12/22 0922  SpO2 100 % 11/12/22 0922  Vitals shown include unfiled device data.  Last Pain:  Vitals:   11/12/22 0750  TempSrc: Temporal         Complications: No notable events documented.

## 2022-11-12 NOTE — Anesthesia Procedure Notes (Signed)
Procedure Name: MAC Date/Time: 11/12/2022 8:44 AM  Performed by: Vanessa Schoeneck, CRNAPre-anesthesia Checklist: Patient identified, Emergency Drugs available, Suction available and Patient being monitored Patient Re-evaluated:Patient Re-evaluated prior to induction Oxygen Delivery Method: Simple face mask

## 2022-11-12 NOTE — Discharge Instructions (Signed)

## 2022-11-12 NOTE — Op Note (Signed)
[facility] Patient Name: Timothy [name] Procedure Date: 11/12/2022 MRN: [id number] Attending MD: Wilhemina Bonito. Marina Goodell , MD, [id number] Date of Birth: 1955/05/16 CSN: [id number] Age: 67 Admit Type: Outpatient Procedure:                Colonoscopy with cold snare polypectomy x 1 Indications:              Positive Cologuard test Providers:                Wilhemina Bonito. Marina Goodell, MD, Fransisca Connors, Adin Hector,                            RN, Kandice Robinsons, Technician Referring MD:             Marcine Matar, MD Medicines:                Monitored Anesthesia Care Complications:            No immediate complications. Estimated blood loss:                            None. Estimated Blood Loss:     Estimated blood loss: none. Procedure:                Pre-Anesthesia Assessment:                           - Prior to the procedure, a History and Physical                            was performed, and patient medications and                            allergies were reviewed. The patient's tolerance of                            previous anesthesia was also reviewed. The risks                            and benefits of the procedure and the sedation                            options and risks were discussed with the patient.                            All questions were answered, and informed consent                            was obtained. Prior Anticoagulants: The patient has                            taken Brilinta (ticagrelor), last dose was 5 days                            prior to procedure. ASA Grade Assessment: III - A  patient with severe systemic disease. After                            reviewing the risks and benefits, the patient was                            deemed in satisfactory condition to undergo the                            procedure.                           After obtaining informed consent, the colonoscope                             was passed under direct vision. Throughout the                            procedure, the patient's blood pressure, pulse, and                            oxygen saturations were monitored continuously. The                            [id number]-HQ190L ([id number]) Olympus colonoscope was                            introduced through the anus and advanced to the the                            cecum, identified by appendiceal orifice and                            ileocecal valve. The ileocecal valve, appendiceal                            orifice, and rectum were photographed. The quality                            of the bowel preparation was excellent. The                            colonoscopy was performed without difficulty. The                            patient tolerated the procedure well. The bowel                            preparation used was SUPREP via split dose                            instruction. Scope In: 8:54:47 AM Scope Out: 9:15:46 AM Scope Withdrawal Time: 0 hours 12 minutes 36 seconds  Total Procedure Duration: 0 hours 20 minutes 59 seconds  Findings:  A 4 mm polyp was found in the ascending colon. The polyp was removed       with a cold snare. Resection and retrieval were complete.      The exam was otherwise without abnormality on direct and retroflexion       views. Impression:               - One 4 mm polyp in the ascending colon, removed                            with a cold snare. Resected and retrieved.                           - The examination was otherwise normal on direct                            and retroflexion views. Moderate Sedation:      none Recommendation:           - Repeat colonoscopy in 10 years for surveillance.                           - Resume Brilinta (ticagrelor) today at prior dose.                           - Patient has a contact number available for                            emergencies. The signs and symptoms of potential                             delayed complications were discussed with the                            patient. Return to normal activities tomorrow.                            Written discharge instructions were provided to the                            patient.                           - Resume previous diet.                           - Continue present medications.                           - Await pathology results. Procedure Code(s):        --- Professional ---                           [phone], Colonoscopy, flexible; with removal of                            tumor(s), polyp(s), or other lesion(s) by snare  technique Diagnosis Code(s):        --- Professional ---                           D12.2, Benign neoplasm of ascending colon                           R19.5, Other fecal abnormalities CPT copyright 2022 American Medical Association. All rights reserved. The codes documented in this report are preliminary and upon coder review may  be revised to meet current compliance requirements. Wilhemina Bonito. Marina Goodell, MD 11/12/2022 9:27:35 AM This report has been signed electronically. Number of Addenda: 0

## 2022-11-12 NOTE — Interval H&P Note (Signed)
History and Physical Interval Note:  11/12/2022 8:46 AM  Timothy [name]  has presented today for surgery, with the diagnosis of positive cologuard.  The various methods of treatment have been discussed with the patient and family. After consideration of risks, benefits and other options for treatment, the patient has consented to  Procedure(s): COLONOSCOPY WITH PROPOFOL (N/A) as a surgical intervention.  The patient's history has been reviewed, patient examined, no change in status, stable for surgery.  I have reviewed the patient's chart and labs.  Questions were answered to the patient's satisfaction.     Timothy [name]

## 2022-11-12 NOTE — Anesthesia Postprocedure Evaluation (Signed)
Anesthesia Post Note  Patient: Timothy [name]  Procedure(s) Performed: COLONOSCOPY WITH PROPOFOL POLYPECTOMY     Patient location during evaluation: Endoscopy Anesthesia Type: MAC Level of consciousness: awake and alert Pain management: pain level controlled Vital Signs Assessment: post-procedure vital signs reviewed and stable Respiratory status: spontaneous breathing, nonlabored ventilation, respiratory function stable and patient connected to nasal cannula oxygen Cardiovascular status: blood pressure returned to baseline and stable Postop Assessment: no apparent nausea or vomiting Anesthetic complications: no   No notable events documented.  Last Vitals:  Vitals:   11/12/22 0931 11/12/22 0940  BP: 121/69 129/73  Pulse: 74 74  Resp: (!) 24 15  Temp:    SpO2: 99% 96%    Last Pain:  Vitals:   11/12/22 0940  TempSrc:   PainSc: 0-No pain                 Trevor Iha

## 2022-11-13 ENCOUNTER — Encounter (HOSPITAL_COMMUNITY): Payer: Self-pay | Admitting: Internal Medicine

## 2022-11-13 LAB — SURGICAL PATHOLOGY

## 2022-12-15 ENCOUNTER — Other Ambulatory Visit: Payer: Self-pay | Admitting: Internal Medicine

## 2022-12-15 DIAGNOSIS — I10 Essential (primary) hypertension: Secondary | ICD-10-CM

## 2022-12-16 ENCOUNTER — Other Ambulatory Visit: Payer: Self-pay | Admitting: Cardiovascular Disease

## 2022-12-16 DIAGNOSIS — I2510 Atherosclerotic heart disease of native coronary artery without angina pectoris: Secondary | ICD-10-CM

## 2023-01-27 ENCOUNTER — Inpatient Hospital Stay: Payer: Medicare Other | Attending: Internal Medicine

## 2023-01-27 ENCOUNTER — Other Ambulatory Visit: Payer: Self-pay | Admitting: Hematology and Oncology

## 2023-01-27 ENCOUNTER — Inpatient Hospital Stay: Payer: Medicare Other | Admitting: Hematology and Oncology

## 2023-01-27 DIAGNOSIS — D72825 Bandemia: Secondary | ICD-10-CM

## 2023-01-27 NOTE — Progress Notes (Deleted)
[facility] [phone]   [phone]  PROGRESS NOTE  Patient Care Team: Marcine Matar, MD as PCP - General (Internal Medicine) Lennette Bihari, MD as PCP - Cardiology (Cardiology)  Hematological/Oncological History # ***  Interval History:  Timothy [name] 67 y.o. male with medical history significant for *** presents for a follow up visit. The patient's last visit was on ***. In the interim since the last visit ***  MEDICAL HISTORY:  Past Medical History:  Diagnosis Date   Altered mental status, improved at discharge 06/19/2013   Arthritis    Bladder cancer (HCC) 2018   Bronchitis, chronic (HCC) 06/19/2013   CAD (coronary artery disease)    a. s/p cardiac arrest in 06/2013 with DES to RCA b. 02/2020: Inferior STEMI and cath showing total occlusion of mid-RCA treated with DES placement.    Cancer [facility])    bladder   Cardiac arrest, hypothermic protocol 06/12/2013   COPD exacerbation (HCC) 06/12/2013   GERD (gastroesophageal reflux disease)    Hx of leukocytosis    Hyperlipidemia LDL goal < 70 06/19/2013   Hypokalemia, replaced 06/19/2013   Hypomagnesemia, replaced 06/19/2013   Hypotension, requiring levophed 06/19/2013   NSTEMI (non-ST elevated myocardial infarction), DES to RCA 06/13/2013   Pre-diabetes    Tobacco abuse 06/19/2013    SURGICAL HISTORY: Past Surgical History:  Procedure Laterality Date   cardiac sstent     COLONOSCOPY WITH PROPOFOL N/A 11/12/2022   Procedure: COLONOSCOPY WITH PROPOFOL;  Surgeon: Hilarie Fredrickson, MD;  Location: [facility];  Service: Gastroenterology;  Laterality: N/A;   CORONARY ANGIOPLASTY WITH STENT PLACEMENT  06/12/2013   [facility]ience [place] DES to RCA, NSTEMI   CORONARY STENT INTERVENTION N/A 02/24/2020   Procedure: CORONARY STENT INTERVENTION;  Surgeon: Lyn Records, MD;  Location: [facility] CV [facility];  Service: Cardiovascular;  Laterality: N/A;   CORONARY/GRAFT ACUTE MI REVASCULARIZATION N/A  02/24/2020   Procedure: Coronary/Graft Acute MI Revascularization;  Surgeon: Lyn Records, MD;  Location: [facility] INVASIVE CV LAB;  Service: Cardiovascular;  Laterality: N/A;   CYSTOSCOPY W/ RETROGRADES Left 03/29/2017   Procedure: CYSTOSCOPY WITH LEFT RETROGRADE PYELOGRAM;  Surgeon: Ihor Gully, MD;  Location: [facility];  Service: Urology;  Laterality: Left;   CYSTOSCOPY WITH DIRECT VISION INTERNAL URETHROTOMY N/A 09/15/2022   Procedure: CYSTOSCOPY WITH BALLOON DILATION;  Surgeon: Joline Maxcy, MD;  Location: [facility];  Service: Urology;  Laterality: N/A;   LEFT HEART CATH AND CORONARY ANGIOGRAPHY N/A 02/24/2020   Procedure: LEFT HEART CATH AND CORONARY ANGIOGRAPHY;  Surgeon: Lyn Records, MD;  Location: [facility] CV [facility];  Service: Cardiovascular;  Laterality: N/A;   LEFT HEART CATHETERIZATION WITH CORONARY ANGIOGRAM N/A 06/12/2013   Procedure: LEFT HEART CATHETERIZATION WITH CORONARY ANGIOGRAM;  Surgeon: Lennette Bihari, MD;  Location: [facility] CATH LAB;  Service: Cardiovascular;  Laterality: N/A;   POLYPECTOMY  11/12/2022   Procedure: POLYPECTOMY;  Surgeon: Hilarie Fredrickson, MD;  Location: [facility];  Service: Gastroenterology;;   TRANSURETHRAL RESECTION OF BLADDER TUMOR N/A 03/29/2017   Procedure: TRANSURETHRAL RESECTION OF BLADDER TUMOR (TURBT)/;  Surgeon: Ihor Gully, MD;  Location: [facility];  Service: Urology;  Laterality: N/A;   transurethral ressection of bladder tumor     Dr. Vernie Ammons 03/29/17    SOCIAL HISTORY: Social History   Socioeconomic History   Marital status: Divorced    Spouse name: Not on file   Number of children: Not on file   Years of education: Not on file  Highest education level: Not on file  Occupational History   Not on file  Tobacco Use   Smoking status: Former    Current packs/day: 0.00    Average packs/day: 2.0 packs/day for 38.0 years (76.0 ttl pk-yrs)    Types: Cigarettes    Start date: 07/11/1975    Quit date: 07/10/2013    Years since quitting: 9.5    Smokeless tobacco: Never  Vaping Use   Vaping status: Never Used  Substance and Sexual Activity   Alcohol use: No   Drug use: No    Types: Marijuana   Sexual activity: Yes  Other Topics Concern   Not on file  Social History Narrative   Not on file   Social Determinants of Health   Financial Resource Strain: Low Risk  (05/28/2022)   Overall Financial Resource Strain (CARDIA)    Difficulty of Paying Living Expenses: Not hard at all  Food Insecurity: No Food Insecurity (05/28/2022)   Hunger Vital Sign    Worried About Running Out of Food in the Last Year: Never true    Ran Out of Food in the Last Year: Never true  Transportation Needs: No Transportation Needs (05/28/2022)   PRAPARE - Administrator, Civil Service (Medical): No    Lack of Transportation (Non-Medical): No  Physical Activity: Inactive (05/28/2022)   Exercise Vital Sign    Days of Exercise per Week: 0 days    Minutes of Exercise per Session: 0 min  Stress: No Stress Concern Present (05/28/2022)   [employer] of Occupational Health - Occupational Stress Questionnaire    Feeling of Stress : Not at all  Social Connections: Not on file  Intimate Partner Violence: Not on file    FAMILY HISTORY: Family History  Problem Relation Age of Onset   Hypertension Other    Colon cancer Neg Hx    Stomach cancer Neg Hx    Esophageal cancer Neg Hx     ALLERGIES:  is allergic to ace inhibitors, aspirin, ibuprofen, and losartan.  MEDICATIONS:  Current Outpatient Medications  Medication Sig Dispense Refill   acetaminophen (TYLENOL) 500 MG tablet Take 500-1,000 mg by mouth every 8 (eight) hours as needed for mild pain or headache.      albuterol (VENTOLIN HFA) 108 (90 Base) MCG/ACT inhaler Inhale 2 puffs into the lungs every 6 (six) hours as needed for wheezing or shortness of breath. 8 g 12   amLODipine (NORVASC) 5 MG tablet TAKE 1 TABLET(5 MG) BY MOUTH DAILY 90 tablet 1   aspirin EC 81 MG tablet Take 1 tablet  (81 mg total) by mouth daily. 90 tablet 2   atorvastatin (LIPITOR) 80 MG tablet Take 1 tablet (80 mg total) by mouth at bedtime. 90 tablet 3   bisoprolol (ZEBETA) 5 MG tablet TAKE 1/2 TABLET(2.5 MG) BY MOUTH AT BEDTIME 45 tablet 2   BREZTRI AEROSPHERE 160-9-4.8 MCG/ACT AERO INHALE 2 PUFFS INTO THE LUNGS IN THE MORNING AND AT BEDTIME 10.7 g 5   famotidine (PEPCID) 20 MG tablet Take 20 mg by mouth daily as needed for heartburn or indigestion.     finasteride (PROSCAR) 5 MG tablet Take 1 tablet (5 mg total) by mouth daily. 90 tablet 3   nitroGLYCERIN (NITROSTAT) 0.4 MG SL tablet Place 1 tablet (0.4 mg total) under the tongue every 5 (five) minutes as needed for chest pain. NEED OV. 25 tablet 2   OXYGEN Inhale 2 L into the lungs at bedtime.  ticagrelor (BRILINTA) 90 MG TABS tablet TAKE 1 TABLET(90 MG) BY MOUTH TWICE DAILY 180 tablet 3   No current facility-administered medications for this visit.    REVIEW OF SYSTEMS:   Constitutional: ( - ) fevers, ( - )  chills , ( - ) night sweats Eyes: ( - ) blurriness of vision, ( - ) double vision, ( - ) watery eyes Ears, nose, mouth, throat, and face: ( - ) mucositis, ( - ) sore throat Respiratory: ( - ) cough, ( - ) dyspnea, ( - ) wheezes Cardiovascular: ( - ) palpitation, ( - ) chest discomfort, ( - ) lower extremity swelling Gastrointestinal:  ( - ) nausea, ( - ) heartburn, ( - ) change in bowel habits Skin: ( - ) abnormal skin rashes Lymphatics: ( - ) new lymphadenopathy, ( - ) easy bruising Neurological: ( - ) numbness, ( - ) tingling, ( - ) new weaknesses Behavioral/Psych: ( - ) mood change, ( - ) new changes  All other systems were reviewed with the patient and are negative.  PHYSICAL EXAMINATION: ECOG PERFORMANCE STATUS: {CHL ONC ECOG PS:[id number]}  There were no vitals filed for this visit. There were no vitals filed for this visit.  GENERAL: alert, no distress and comfortable SKIN: skin color, texture, turgor are normal, no rashes or  significant lesions EYES: conjunctiva are pink and non-injected, sclera clear OROPHARYNX: no exudate, no erythema; lips, buccal mucosa, and tongue normal  NECK: supple, non-tender LYMPH:  no palpable lymphadenopathy in the cervical, axillary or inguinal LUNGS: clear to auscultation and percussion with normal breathing effort HEART: regular rate & rhythm and no murmurs and no lower extremity edema ABDOMEN: soft, non-tender, non-distended, normal bowel sounds Musculoskeletal: no cyanosis of digits and no clubbing  PSYCH: alert & oriented x 3, fluent speech NEURO: no focal motor/sensory deficits  LABORATORY DATA:  I have reviewed the data as listed    Latest Ref Rng & Units 09/07/2022    1:33 PM 07/27/2022    2:27 PM 06/25/2022   12:32 PM  CBC  WBC 4.0 - 10.5 K/uL 15.0  14.9  14.5   Hemoglobin 13.0 - 17.0 g/dL 08.6  57.8  46.9   Hematocrit 39.0 - 52.0 % 50.6  50.2  50.6   Platelets 150 - 400 K/uL 477  411  436        Latest Ref Rng & Units 09/07/2022    1:33 PM 07/27/2022    2:27 PM 06/25/2022   12:32 PM  CMP  Glucose 70 - 99 mg/dL 629  528  93   BUN 8 - 23 mg/dL 12  13  10    Creatinine 0.61 - 1.24 mg/dL 4.13  2.44  0.10   Sodium 135 - 145 mmol/L 140  138  140   Potassium 3.5 - 5.1 mmol/L 4.4  4.4  4.9   Chloride 98 - 111 mmol/L 104  101  99   CO2 22 - 32 mmol/L 27  28  26    Calcium 8.9 - 10.3 mg/dL 9.6  27.2  53.6   Total Protein 6.5 - 8.1 g/dL  8.1  7.3   Total Bilirubin 0.3 - 1.2 mg/dL  0.8  0.4   Alkaline Phos 38 - 126 U/L  90  89   AST 15 - 41 U/L  15  16   ALT 0 - 44 U/L  18  17     No results found for: "MPROTEIN" No results found for: "KPAFRELGTCHN", "LAMBDASER", "  KAPLAMBRATIO"   BLOOD FILM: *** Review of the peripheral blood smear showed normal appearing white cells with neutrophils that were appropriately lobated and granulated. There was no predominance of bi-lobed or hyper-segmented neutrophils appreciated. No Dohle bodies were noted. There was no left shifting,  immature forms or blasts noted. Lymphocytes remain normal in size without any predominance of large granular lymphocytes. Red cells show no anisopoikilocytosis, macrocytes , microcytes or polychromasia. There were no schistocytes, target cells, echinocytes, acanthocytes, dacrocytes, or stomatocytes.There was no rouleaux formation, nucleated red cells, or intra-cellular inclusions noted. The platelets are normal in size, shape, and color without any clumping evident.  RADIOGRAPHIC STUDIES: I have personally reviewed the radiological images as listed and agreed with the findings in the report. No results found.  ASSESSMENT & PLAN ***  No orders of the defined types were placed in this encounter.   All questions were answered. The patient knows to call the clinic with any problems, questions or concerns.  A total of more than 30 minutes were spent on this encounter with face-to-face time and non-face-to-face time, including preparing to see the patient, ordering tests and/or medications, counseling the patient and coordination of care as outlined above.   Ulysees Barns, MD Department of Hematology/Oncology [facility] Phone: [phone] Pager: [phone] Email: [email or web address]  01/27/2023 7:51 AM

## 2023-02-25 ENCOUNTER — Encounter: Payer: Self-pay | Admitting: Internal Medicine

## 2023-02-25 ENCOUNTER — Ambulatory Visit: Payer: Medicare Other | Attending: Internal Medicine | Admitting: Internal Medicine

## 2023-02-25 VITALS — BP 119/73 | HR 77 | Temp 98.0°F | Ht 66.0 in | Wt 126.0 lb

## 2023-02-25 DIAGNOSIS — Z23 Encounter for immunization: Secondary | ICD-10-CM | POA: Diagnosis not present

## 2023-02-25 DIAGNOSIS — I10 Essential (primary) hypertension: Secondary | ICD-10-CM

## 2023-02-25 DIAGNOSIS — I2510 Atherosclerotic heart disease of native coronary artery without angina pectoris: Secondary | ICD-10-CM | POA: Diagnosis not present

## 2023-02-25 DIAGNOSIS — J439 Emphysema, unspecified: Secondary | ICD-10-CM

## 2023-02-25 DIAGNOSIS — J9611 Chronic respiratory failure with hypoxia: Secondary | ICD-10-CM

## 2023-02-25 DIAGNOSIS — D72829 Elevated white blood cell count, unspecified: Secondary | ICD-10-CM | POA: Diagnosis not present

## 2023-02-25 DIAGNOSIS — Z9981 Dependence on supplemental oxygen: Secondary | ICD-10-CM

## 2023-02-25 MED ORDER — ZOSTER VAC RECOMB ADJUVANTED 50 MCG/0.5ML IM SUSR
0.5000 mL | Freq: Once | INTRAMUSCULAR | 0 refills | Status: AC
Start: 2023-02-25 — End: 2023-02-25

## 2023-02-25 NOTE — Progress Notes (Signed)
Patient ID: Timothy [name], male    DOB: [birth year]  MRN: [id number]  CC: Hypertension (HTN f/u. Valentino Hue to flu & shingles vax)   Subjective: Timothy [name] is a 67 y.o. male who presents for chronic ds management. His concerns today include:  hx of HTN, cardiac arrest vfib/vtach 06/2013, CAD with stent to RCA, COPD Gold stage III, former smoker, pre-DM, lung nodule on CT 08/2015, Elev PSA (neg prostate bx 07/2022) and bladder CA.   Patient has had colonoscopy since last visit with me because of positive Cologuard test. 1 tubular adenomatous polyp removed  HYPERTENSION/CAD Currently taking: see medication list.   Compliant with Lipitor 80 mg daily, Brilinta, aspirin 81 mg, Norvasc 5 mg daily, Zebeta 5 mg daily.  Med Adherence: [x]  Yes    []  No Medication side effects: []  Yes    []  No Adherence with salt restriction: []  Yes    []  No Home Monitoring?: []  Yes    []  No Monitoring Frequency:  Home BP results range:  SOB? [x]  Yes  due to COPD  []  No Chest Pain?: []  Yes    [x]  No Leg swelling?: []  Yes    [x]  No Headaches?: []  Yes    [x]  No Dizziness? []  Yes    [x]  No Comments:   COPD: still using O2 at nights.  Using [place] twice a day  and Albuterol BID to none at all some days; no recent COPD flare. Productive cough mainly in mornings, no blood in phlegm  Leukocytosis: He had seen Dr. Leonides Schanz in March of this year.  He had negative JAK2 and BCR/ABL.  He was supposed to follow-up in September but no showed the appointment.  States he is tired of having to see so many doctors  Bladder CA:  has f/u schedule with urology 04/2023.   Patient Active Problem List   Diagnosis Date Noted   Colon cancer screening 11/12/2022   Positive colorectal cancer screening using Cologuard test 11/12/2022   Benign neoplasm of ascending colon 11/12/2022   Influenza A 04/09/2021   Acute on chronic respiratory failure with hypoxia (HCC) 04/09/2021   COPD exacerbation (HCC) 04/03/2021   COPD (chronic  obstructive pulmonary disease) (HCC) 04/02/2021   Exercise hypoxemia 07/02/2020   Acute STEMI of inferior wall  02/24/2020   Malignant neoplasm of urinary bladder (HCC) 02/23/2017   Lung nodule 11/20/2016   Gastroesophageal reflux disease without esophagitis 11/20/2016   Prediabetes 12/29/2013   Dental cavities 12/29/2013   DOE (dyspnea on exertion) 08/30/2013   Essential hypertension 08/03/2013   CAD S/P percutaneous coronary angioplasty 06/19/2013   Hyperlipidemia with target LDL less than 70 06/19/2013   Former cigarette smoker 06/19/2013   NSTEMI (non-ST elevated myocardial infarction), DES to RCA 06/13/2013   COPD GOLD III 06/12/2013     Current Outpatient Medications on File Prior to Visit  Medication Sig Dispense Refill   acetaminophen (TYLENOL) 500 MG tablet Take 500-1,000 mg by mouth every 8 (eight) hours as needed for mild pain or headache.      albuterol (VENTOLIN HFA) 108 (90 Base) MCG/ACT inhaler Inhale 2 puffs into the lungs every 6 (six) hours as needed for wheezing or shortness of breath. 8 g 12   amLODipine (NORVASC) 5 MG tablet TAKE 1 TABLET(5 MG) BY MOUTH DAILY 90 tablet 1   aspirin EC 81 MG tablet Take 1 tablet (81 mg total) by mouth daily. 90 tablet 2   atorvastatin (LIPITOR) 80 MG tablet Take 1 tablet (80 mg  total) by mouth at bedtime. 90 tablet 3   bisoprolol (ZEBETA) 5 MG tablet TAKE 1/2 TABLET(2.5 MG) BY MOUTH AT BEDTIME 45 tablet 2   BREZTRI AEROSPHERE 160-9-4.8 MCG/ACT AERO INHALE 2 PUFFS INTO THE LUNGS IN THE MORNING AND AT BEDTIME 10.7 g 5   esomeprazole (NEXIUM) 20 MG packet Take 20 mg by mouth daily before breakfast.     finasteride (PROSCAR) 5 MG tablet Take 1 tablet (5 mg total) by mouth daily. 90 tablet 3   nitroGLYCERIN (NITROSTAT) 0.4 MG SL tablet Place 1 tablet (0.4 mg total) under the tongue every 5 (five) minutes as needed for chest pain. NEED OV. 25 tablet 2   OXYGEN Inhale 2 L into the lungs at bedtime.     ticagrelor (BRILINTA) 90 MG TABS tablet  TAKE 1 TABLET(90 MG) BY MOUTH TWICE DAILY 180 tablet 3   famotidine (PEPCID) 20 MG tablet Take 20 mg by mouth daily as needed for heartburn or indigestion. (Patient not taking: Reported on 02/25/2023)     No current facility-administered medications on file prior to visit.    Allergies  Allergen Reactions   Ace Inhibitors Other (See Comments)    Caused wheezing   Aspirin Swelling and Other (See Comments)    Lips swell- still takes approx 2 times a week in 2021 Low dose ASA is fine, no problems   Ibuprofen Swelling and Other (See Comments)    Lips swell   Losartan Other (See Comments)    Leg cramps    Social History   Socioeconomic History   Marital status: Divorced    Spouse name: Not on file   Number of children: Not on file   Years of education: Not on file   Highest education level: Not on file  Occupational History   Not on file  Tobacco Use   Smoking status: Former    Current packs/day: 0.00    Average packs/day: 2.0 packs/day for 38.0 years (76.0 ttl pk-yrs)    Types: Cigarettes    Start date: 07/11/1975    Quit date: 07/10/2013    Years since quitting: 9.6   Smokeless tobacco: Never  Vaping Use   Vaping status: Never Used  Substance and Sexual Activity   Alcohol use: No   Drug use: No    Types: Marijuana   Sexual activity: Yes  Other Topics Concern   Not on file  Social History Narrative   Not on file   Social Determinants of Health   Financial Resource Strain: Low Risk  (05/28/2022)   Overall Financial Resource Strain (CARDIA)    Difficulty of Paying Living Expenses: Not hard at all  Food Insecurity: No Food Insecurity (05/28/2022)   Hunger Vital Sign    Worried About Running Out of Food in the Last Year: Never true    Ran Out of Food in the Last Year: Never true  Transportation Needs: No Transportation Needs (05/28/2022)   PRAPARE - Administrator, Civil Service (Medical): No    Lack of Transportation (Non-Medical): No  Physical Activity:  Inactive (05/28/2022)   Exercise Vital Sign    Days of Exercise per Week: 0 days    Minutes of Exercise per Session: 0 min  Stress: No Stress Concern Present (05/28/2022)   [employer] of Occupational Health - Occupational Stress Questionnaire    Feeling of Stress : Not at all  Social Connections: Not on file  Intimate Partner Violence: Not on file    Family History  Problem Relation Age of Onset   Hypertension Other    Colon cancer Neg Hx    Stomach cancer Neg Hx    Esophageal cancer Neg Hx     Past Surgical History:  Procedure Laterality Date   cardiac sstent     COLONOSCOPY WITH PROPOFOL N/A 11/12/2022   Procedure: COLONOSCOPY WITH PROPOFOL;  Surgeon: Hilarie Fredrickson, MD;  Location: [facility];  Service: Gastroenterology;  Laterality: N/A;   CORONARY ANGIOPLASTY WITH STENT PLACEMENT  06/12/2013   [employer] [place] DES to RCA, NSTEMI   CORONARY STENT INTERVENTION N/A 02/24/2020   Procedure: CORONARY STENT INTERVENTION;  Surgeon: Lyn Records, MD;  Location: [facility];  Service: Cardiovascular;  Laterality: N/A;   CORONARY/GRAFT ACUTE MI REVASCULARIZATION N/A 02/24/2020   Procedure: Coronary/Graft Acute MI Revascularization;  Surgeon: Lyn Records, MD;  Location: [facility];  Service: Cardiovascular;  Laterality: N/A;   CYSTOSCOPY W/ RETROGRADES Left 03/29/2017   Procedure: CYSTOSCOPY WITH LEFT RETROGRADE PYELOGRAM;  Surgeon: Ihor Gully, MD;  Location: [facility];  Service: Urology;  Laterality: Left;   CYSTOSCOPY WITH DIRECT VISION INTERNAL URETHROTOMY N/A 09/15/2022   Procedure: CYSTOSCOPY WITH BALLOON DILATION;  Surgeon: Joline Maxcy, MD;  Location: [facility];  Service: Urology;  Laterality: N/A;   LEFT HEART CATH AND CORONARY ANGIOGRAPHY N/A 02/24/2020   Procedure: LEFT HEART CATH AND CORONARY ANGIOGRAPHY;  Surgeon: Lyn Records, MD;  Location: [facility];  Service: Cardiovascular;  Laterality: N/A;   LEFT HEART CATHETERIZATION WITH CORONARY  ANGIOGRAM N/A 06/12/2013   Procedure: LEFT HEART CATHETERIZATION WITH CORONARY ANGIOGRAM;  Surgeon: Lennette Bihari, MD;  Location: [facility] CATH [facility];  Service: Cardiovascular;  Laterality: N/A;   POLYPECTOMY  11/12/2022   Procedure: POLYPECTOMY;  Surgeon: Hilarie Fredrickson, MD;  Location: [facility];  Service: Gastroenterology;;   TRANSURETHRAL RESECTION OF BLADDER TUMOR N/A 03/29/2017   Procedure: TRANSURETHRAL RESECTION OF BLADDER TUMOR (TURBT)/;  Surgeon: Ihor Gully, MD;  Location: [facility];  Service: Urology;  Laterality: N/A;   transurethral ressection of bladder tumor     Dr. Vernie Ammons 03/29/17    ROS: Review of Systems Negative except as stated above  PHYSICAL EXAM: BP 119/73 (BP Location: Left Arm, Patient Position: Sitting, Cuff Size: Normal)   Pulse 77   Temp 98 F (36.7 C) (Oral)   Ht 5\' 6"  (1.676 m)   Wt 126 lb (57.2 kg)   SpO2 94%   BMI 20.34 kg/m   Wt Readings from Last 3 Encounters:  02/25/23 126 lb (57.2 kg)  11/12/22 120 lb (54.4 kg)  10/26/22 119 lb (54 kg)    Physical Exam General appearance - alert, well appearing, older Caucasian male and in no distress Mental status - normal mood, behavior, speech, dress, motor activity, and thought processes Neck - supple, no significant adenopathy Chest -breath sounds moderately decreased bilaterally.  No wheezes or rhonchi is heard. Heart - normal rate, regular rhythm, normal S1, S2, no murmurs, rubs, clicks or gallops Extremities - peripheral pulses normal, no pedal edema, no clubbing or cyanosis     Latest Ref Rng & Units 09/07/2022    1:33 PM 07/27/2022    2:27 PM 06/25/2022   12:32 PM  CMP  Glucose 70 - 99 mg/dL 191  478  93   BUN 8 - 23 mg/dL 12  13  10    Creatinine 0.61 - 1.24 mg/dL 2.95  6.21  3.08   Sodium 135 - 145 mmol/L 140  138  140   Potassium 3.5 - 5.1 mmol/L 4.4  4.4  4.9   Chloride 98 - 111 mmol/L 104  101  99   CO2 22 - 32 mmol/L 27  28  26    Calcium 8.9 - 10.3 mg/dL 9.6  86.5  78.4   Total Protein 6.5 -  8.1 g/dL  8.1  7.3   Total Bilirubin 0.3 - 1.2 mg/dL  0.8  0.4   Alkaline Phos 38 - 126 U/L  90  89   AST 15 - 41 U/L  15  16   ALT 0 - 44 U/L  18  17    Lipid Panel     Component Value Date/Time   CHOL 279 (H) 06/25/2022 1232   TRIG 180 (H) 06/25/2022 1232   HDL 57 06/25/2022 1232   CHOLHDL 4.9 06/25/2022 1232   CHOLHDL 5.6 02/24/2020 1253   VLDL 42 (H) 02/24/2020 1253   LDLCALC 188 (H) 06/25/2022 1232    CBC    Component Value Date/Time   WBC 15.0 (H) 09/07/2022 1333   RBC 5.50 09/07/2022 1333   HGB 16.6 09/07/2022 1333   HGB 17.0 07/27/2022 1427   HGB 16.9 06/25/2022 1232   HCT 50.6 09/07/2022 1333   HCT 50.6 06/25/2022 1232   PLT 477 (H) 09/07/2022 1333   PLT 411 (H) 07/27/2022 1427   PLT 436 06/25/2022 1232   MCV 92.0 09/07/2022 1333   MCV 91 06/25/2022 1232   MCH 30.2 09/07/2022 1333   MCHC 32.8 09/07/2022 1333   RDW 13.1 09/07/2022 1333   RDW 12.5 06/25/2022 1232   LYMPHSABS 2.4 07/27/2022 1427   LYMPHSABS 1.7 04/21/2021 1140   MONOABS 1.7 (H) 07/27/2022 1427   EOSABS 0.3 07/27/2022 1427   EOSABS 0.1 04/21/2021 1140   BASOSABS 0.1 07/27/2022 1427   BASOSABS 0.1 04/21/2021 1140    ASSESSMENT AND PLAN: 1. Essential hypertension At goal.  Continue Norvasc 5 mg daily and bisoprolol 5 mg daily.  2. Coronary artery disease involving native coronary artery of native heart without angina pectoris Stable.  Continue bisoprolol 5 mg daily, aspirin, Brilinta and atorvastatin 80 mg daily.  3. Pulmonary emphysema, unspecified emphysema type (HCC) Stable.  Continue Breztri inhaler twice a day and albuterol as needed.  4. Chronic respiratory failure with hypoxia, on home O2 therapy (HCC) Continue home O2 at nights.  5. Leukocytosis, unspecified type Patient does not wish to return at this time for follow-up with Dr. Leonides Schanz but is agreeable to having CBC repeated today. - CBC With Diff/Platelet  6. Need for shingles vaccine He misplaced prescription for the  Shingrix vaccine that was given on a previous visit.  I printed a new prescription for him today and told him to take it downstairs to our pharmacy. - Zoster Vaccine Adjuvanted [facility]) injection; Inject 0.5 mLs into the muscle once for 1 dose.  Dispense: 0.5 mL; Refill: 0  7. Need for immunization against influenza Given today.    Patient was given the opportunity to ask questions.  Patient verbalized understanding of the plan and was able to repeat key elements of the plan.   This documentation was completed using Paediatric nurse.  Any transcriptional errors are unintentional.  No orders of the defined types were placed in this encounter.    Requested Prescriptions    No prescriptions requested or ordered in this encounter    No follow-ups on file.  Jonah Blue, MD, FACP

## 2023-02-26 LAB — CBC WITH DIFF/PLATELET
Basophils Absolute: 0.1 10*3/uL (ref 0.0–0.2)
Basos: 1 %
EOS (ABSOLUTE): 0.2 10*3/uL (ref 0.0–0.4)
Eos: 1 %
Hematocrit: 53.3 % — ABNORMAL HIGH (ref 37.5–51.0)
Hemoglobin: 17.5 g/dL (ref 13.0–17.7)
Immature Grans (Abs): 0.2 10*3/uL — ABNORMAL HIGH (ref 0.0–0.1)
Immature Granulocytes: 1 %
Lymphocytes Absolute: 2.7 10*3/uL (ref 0.7–3.1)
Lymphs: 18 %
MCH: 30.6 pg (ref 26.6–33.0)
MCHC: 32.8 g/dL (ref 31.5–35.7)
MCV: 93 fL (ref 79–97)
Monocytes Absolute: 1.8 10*3/uL — ABNORMAL HIGH (ref 0.1–0.9)
Monocytes: 12 %
Neutrophils Absolute: 9.9 10*3/uL — ABNORMAL HIGH (ref 1.4–7.0)
Neutrophils: 67 %
Platelets: 350 10*3/uL (ref 150–450)
RBC: 5.71 x10E6/uL (ref 4.14–5.80)
RDW: 13 % (ref 11.6–15.4)
WBC: 14.8 10*3/uL — ABNORMAL HIGH (ref 3.4–10.8)

## 2023-03-03 NOTE — Progress Notes (Addendum)
Timothy [name], male    DOB: 06-Jul-1955,  MRN: [id number]   Brief patient profile:   54 yowm MM/quit smoking 2010 with GOLD 3 criteria 2015 maint on symbicort 160 2bid and losing ground with ex tol so referred to pulmonary clinic 05/09/2020 by Dr   Laural Benes   Retired Journalist, newspaper    History of Present Illness  05/09/2020  Pulmonary/ 1st office eval/Timothy [name] symb maint / insurance would not pay spiriva Chief Complaint  Patient presents with   Pulmonary Consult     Referred by Dr Jonah Blue. Pt seen here last in 2015 for COPD. He c/o increased SOB over the past 5 years. He states he gets SOB walking up hill and "not too far".  He is using his albuterol inhaler at 4-6 x per wk on average.    Dyspnea:  Grocery shopping limit is [employer] - can't do [employer] anymore, mailbox is 50 ft and no hill steps at slower pace = MMRC3 = can't walk 100 yards even at a slow pace at a flat grade s stopping due to sob   Cough: am congestion/ white  Sleep: on side two pillows flat bed  SABA use: saba hfa  rec Plan A = Automatic = Always=    Stop symbicort and start Breztri Take 2 puffs first thing in am and then another 2 puffs about 12 hours later.  Work on inhaler technique:  Plan B = Backup (to supplement plan A, not to replace it) Only use your albuterol inhaler as a rescue medication Plan C = Crisis (instead of Plan B but only if Plan B stops working) - only use your albuterol nebulizer if you first try Plan B and it fails to help > ok to use the nebulizer up to every 4 hours but if start needing it regularly call for immediate appointment   - 12/18/2020   patient walked at a slow pace x 3 laps @ 250 ft each  and oxygen dropped to 87 % during 2lap and oxygen was placed and patient come back up to 93% on 2 liters> ordered   08/06/21   >>>  Best fit eval for amb 02 done - did ok on 3lpm pulsed at >90% but rec  4 lpm pulse with higher levels of ex    09/03/2021  f/u ov/Timothy [name] re: COPD GOLD 3  maint on breztri  2bid   Chief Complaint  Patient presents with   Follow-up    Breathing is unchanged. He did not bring his o2 today and says he uses this at home sometimes if needed. He is using his albuterol inhaler about 3 x per wk.   Dyspnea:  does better slow pace, refusing amb 02 to date Mb is 50 ft flat / greenhouse is slt downhill an always out of breath on way back, does not prechallenge with saba as rec  Cough: none  Sleeping: flat one pillow  SABA use: avg  once a day  02:  not really wearing at hs  Covid status:   vax x 3  Rec We will see about you changing companies when your 5 years is up Make sure you check your oxygen saturation  AT  your highest level of activity (NOT after you stop)   to be sure it stays over 90%  Ok to try albuterol 15 min before an activity (on alternating days - like starter fluid)  that you know would usually make you short of breath Please schedule  a follow up visit in 6  months but call sooner if needed     03/03/2022  f/u ov/Timothy [name] re: GOLD 3   maint on breztri   Chief Complaint  Patient presents with   Follow-up    COPD. No sx noted per pt   Dyspnea:  seems to help to use saba before activity  Cough: none  Sleeping:no cc  SABA use: twice daily  02: prn  Lung cancer screening :  referred   LDSCT 06/11/22   RADS  2 with mucus obst peripheral RLL    03/04/2023  f/u ov/Timothy [name] re: GOLD 3 copd/ on 02 prn    maint on breztri   Chief Complaint  Patient presents with   Follow-up    Dyspnea especially during the night x 3 months.  Dyspnea:  no longer going to greenhouse and back now has to stop  Cough: normal am cough  x 30 min > mucoid frothing  Sleeping: bed is flat 2 pillows on side no problem wearing 02  SABA use: couple of times a day  02: 2lpm hs and prn daytime not much > very confusing story about working with [employer] and what he needs to qualify for 02   Lung cancer screening :  q February     No obvious day to day or daytime variability or assoc  purulent sputum or mucus plugs or hemoptysis or cp or chest tightness, subjective wheeze or overt sinus or hb symptoms.    Also denies any obvious fluctuation of symptoms with weather or environmental changes or other aggravating or alleviating factors except as outlined above   No unusual exposure hx or h/o childhood pna/ asthma or knowledge of premature birth.  Current Allergies, Complete Past Medical History, Past Surgical History, Family History, and Social History were reviewed in [employer] record.  ROS  The following are not active complaints unless bolded Hoarseness, sore throat, dysphagia, dental problems, itching, sneezing,  nasal congestion or discharge of excess mucus or purulent secretions, ear ache,   fever, chills, sweats, unintended wt loss or wt gain, classically pleuritic or exertional cp,  orthopnea pnd or arm/hand swelling  or leg swelling, presyncope, palpitations, abdominal pain, anorexia, nausea, vomiting, diarrhea  or change in bowel habits or change in bladder habits, change in stools or change in urine, dysuria, hematuria,  rash, arthralgias, visual complaints, headache, numbness, weakness or ataxia or problems with walking or coordination,  change in mood or  memory.        Current Meds  Medication Sig   acetaminophen (TYLENOL) 500 MG tablet Take 500-1,000 mg by mouth every 8 (eight) hours as needed for mild pain or headache.    albuterol (VENTOLIN HFA) 108 (90 Base) MCG/ACT inhaler Inhale 2 puffs into the lungs every 6 (six) hours as needed for wheezing or shortness of breath.   amLODipine (NORVASC) 5 MG tablet TAKE 1 TABLET(5 MG) BY MOUTH DAILY   aspirin EC 81 MG tablet Take 1 tablet (81 mg total) by mouth daily.   atorvastatin (LIPITOR) 80 MG tablet Take 1 tablet (80 mg total) by mouth at bedtime.   bisoprolol (ZEBETA) 5 MG tablet TAKE 1/2 TABLET(2.5 MG) BY MOUTH AT BEDTIME   BREZTRI AEROSPHERE 160-9-4.8 MCG/ACT AERO INHALE 2 PUFFS INTO THE  LUNGS IN THE MORNING AND AT BEDTIME   esomeprazole (NEXIUM) 20 MG packet Take 20 mg by mouth daily before breakfast.   famotidine (PEPCID) 20 MG tablet Take 20 mg by mouth  daily as needed for heartburn or indigestion.   finasteride (PROSCAR) 5 MG tablet Take 1 tablet (5 mg total) by mouth daily.   nitroGLYCERIN (NITROSTAT) 0.4 MG SL tablet Place 1 tablet (0.4 mg total) under the tongue every 5 (five) minutes as needed for chest pain. NEED OV.   OXYGEN Inhale 2 L into the lungs at bedtime.   ticagrelor (BRILINTA) 90 MG TABS tablet TAKE 1 TABLET(90 MG) BY MOUTH TWICE DAILY           Past Medical History:  Diagnosis Date   Altered mental status, improved at discharge 06/19/2013   Bronchitis, chronic (HCC) 06/19/2013   CAD (coronary artery disease)    a. s/p cardiac arrest in 06/2013 with DES to RCA b. 02/2020: Inferior STEMI and cath showing total occlusion of mid-RCA treated with DES placement.    Cancer [facility])    bladder   Cardiac arrest, hypothermic protocol 06/12/2013   COPD exacerbation (HCC) 06/12/2013   GERD (gastroesophageal reflux disease)    Hyperlipidemia LDL goal < 70 06/19/2013   Hypokalemia, replaced 06/19/2013   Hypomagnesemia, replaced 06/19/2013   Hypotension, requiring levophed 06/19/2013   NSTEMI (non-ST elevated myocardial infarction), DES to RCA 06/13/2013   Pre-diabetes    Tobacco abuse 06/19/2013        Objective:   Wts  03/04/2023     131  03/03/2022     126  09/03/2021         125   06/30/2021       125  12/18/2020       126  07/01/2020       132   06/20/20 133 lb (60.3 kg)  05/09/20 131 lb (59.4 kg)  03/07/20 138 lb (62.6 kg)      Vital signs reviewed  03/04/2023  - Note at rest 02 sats  95% on RA   General appearance:    somber amb wm easily confused with details of care  HEENT :  Oropharynx  clear   Nasal turbinates nl / prominent pseudowheeze    NECK :  without JVD/Nodes/TM/ nl carotid upstrokes bilaterally   LUNGS: no acc muscle use,  Mod barrel   contour chest wall with bilateral  Distant bs /  wheezes are mid exp and much better with PLM  and  without cough on insp or exp maneuvers and mod  Hyperresonant  to  percussion bilaterally     CV:  RRR  no s3 or murmur or increase in P2, and no edema   ABD:  soft and nontender with pos mid insp Hoover's  in the supine position. No bruits or organomegaly appreciated, bowel sounds nl  MS:   Ext warm without deformities or   obvious joint restrictions , calf tenderness, cyanosis or clubbing  SKIN: warm and dry without lesions    NEURO:  alert, approp, nl sensorium with  no motor or cerebellar deficits apparent.      CXR PA and Lateral:   03/04/2023 :    I personally reviewed images and agree with radiology impression as follows:    1. Ill-defined opacity in the right middle lobe, suspicious for pneumonia. Recommend radiographic follow-up to resolution. 2. Moderate emphysema and central bronchial thickening. 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

## 2023-03-04 ENCOUNTER — Ambulatory Visit: Payer: Medicare Other

## 2023-03-04 ENCOUNTER — Ambulatory Visit: Payer: Medicare Other | Admitting: Internal Medicine

## 2023-03-04 ENCOUNTER — Encounter: Payer: Self-pay | Admitting: Internal Medicine

## 2023-03-04 VITALS — BP 128/72 | HR 73 | Temp 97.8°F | Ht 66.0 in | Wt 131.8 lb

## 2023-03-04 DIAGNOSIS — R918 Other nonspecific abnormal finding of lung field: Secondary | ICD-10-CM | POA: Diagnosis not present

## 2023-03-04 DIAGNOSIS — J449 Chronic obstructive pulmonary disease, unspecified: Secondary | ICD-10-CM

## 2023-03-04 DIAGNOSIS — R0902 Hypoxemia: Secondary | ICD-10-CM

## 2023-03-04 DIAGNOSIS — J439 Emphysema, unspecified: Secondary | ICD-10-CM | POA: Diagnosis not present

## 2023-03-04 DIAGNOSIS — Z955 Presence of coronary angioplasty implant and graft: Secondary | ICD-10-CM | POA: Diagnosis not present

## 2023-03-04 DIAGNOSIS — R059 Cough, unspecified: Secondary | ICD-10-CM | POA: Diagnosis not present

## 2023-03-04 MED ORDER — FAMOTIDINE 20 MG PO TABS: ORAL_TABLET | ORAL | Status: DC

## 2023-03-04 MED ORDER — PANTOPRAZOLE SODIUM 40 MG PO TBEC: 40.0000 mg | DELAYED_RELEASE_TABLET | Freq: Every day | ORAL | 2 refills | Status: DC

## 2023-03-04 MED ORDER — PREDNISONE 10 MG PO TABS: ORAL_TABLET | ORAL | 0 refills | Status: DC

## 2023-03-04 NOTE — Assessment & Plan Note (Addendum)
Reported 07/01/2020  - 12/18/2020   patient walked at a slow pace x 3 laps @ 250 ft each  and oxygen dropped to 87 % during 2lap and oxygen was placed and patient come back up to 93% on 2 liters> ordered .  Best fit eval for amb 02 done 08/06/21  And did ok on 3lpm pulsed at >90% but rec  4 lpm pulse with higher levels of ex > turned down for POC by [employer]  03/04/2023 Patient Saturations on Room Air at Rest = 94%  Patient Saturations on Room Air while Ambulating = 87%  Patient Saturations on 2 Liters of POC oxygen while Ambulating = 93% - ONO on RA 03/04/2023 >>>   Rec:  2lpm POC when ambulating   Not clear whether he really needs noct 02 or not at this point and pt having trouble working with [employer] so will start over with ONO on RA and make sure first he still qualifies then assure whatever noct system he is using is adequate.  In meantime advise: [employer] sats are > 90% at peak exertion   Please schedule a follow up office visit in 6 weeks, call sooner if needed with all medications /inhalers/ solutions in hand so we can verify exactly what you are taking. This includes all medications from all doctors and over the counters     Each maintenance medication was reviewed in detail including emphasizing most importantly the difference between maintenance and prns and under what circumstances the prns are to be triggered using an action plan format where appropriate.  Total time for H and P, chart review, counseling, reviewing hfa/02/pulse ox  device(s) and generating customized AVS unique to this office visit / same day charting = 41 min for new worsening chronic doe

## 2023-03-04 NOTE — Patient Instructions (Addendum)
Pantoprazole (protonix) 40 mg (same as 2 nexium)    Take  30-60 min before first meal of the day and Pepcid (famotidine)  20 mg after supper until return to office - this is the best way to tell whether stomach acid is contributing to your problem.     GERD (REFLUX)  is an extremely common cause of respiratory symptoms just like yours , many times with no obvious heartburn at all.    It can be treated with medication, but also with lifestyle changes including elevation of the head of your bed (ideally with 6 -8inch blocks under the headboard of your bed),  Smoking cessation, avoidance of late meals, excessive alcohol, and avoid fatty foods, chocolate, peppermint, colas, red wine, and acidic juices such as orange juice.  NO MINT OR MENTHOL PRODUCTS SO NO COUGH DROPS  USE SUGARLESS CANDY INSTEAD (Jolley ranchers or Stover's or Life Savers) or even ice chips will also do - the key is to swallow to prevent all throat clearing. NO OIL BASED VITAMINS - use powdered substitutes.  Avoid fish oil when coughing.   For cough/ congestion >   mucinex D  up to  every 12 hours as needed   My office will be contacting you by phone for referral for overnight oximetry on room air   - if you don't hear back from my office within one week please call us back or notify us thru MyChart and we'll address it right away.    Please remember to go to the  x-ray department  for your tests - we will call you with the results when they are available     Please schedule a follow up office visit in 6 weeks, call sooner if needed with all medications /inhalers/ solutions in hand so we can verify exactly what you are taking. This includes all medications from all doctors and over the counters

## 2023-03-04 NOTE — Assessment & Plan Note (Signed)
MM/quit smoking in 2010  - HFA 75% p coaching 09/01/13 > started on symbicort 160 2 bid  - Spirometry 09/15/13  FEV1 1.67 (49%) ratio 44 % - 05/09/2020   breztri 2bid -  05/09/2020   Walked RA  2 laps @ approx 229ft each @ avg pace  stopped due sob / sats 93%  - alpha one AT phenotype  MM,  Level 182  - Allergy profile 05/09/20 >  Eos 0.6/  IgE  162  - PFT's  07/01/2020  FEV1 1.35 (46 % ) ratio 0.48 p 6 % improvement from saba p breztri x 2 prior to study with DLCO  9.40 (40%) corrects to 1.97 (45%)  for alv volume and FV curve classic concavity  - 03/04/2023  After extensive coaching inhaler device,  effectiveness =    80%   >>>   03/04/2023 pseudowheeze on exam/ assoc nasal congestion >> max gerd rx and mucienx D plus 6 d pred  Abrupt worsening 3 m   prior to OV  ? Etiology now with prominent pseudowheeze but really good HFA technique so no change in maint resp rx for now but add rx as above and re-eval in 3 months

## 2023-03-04 NOTE — Addendum Note (Signed)
Addended byClyda Greener M on: 03/04/2023 12:17 PM   Modules accepted: Orders

## 2023-03-05 ENCOUNTER — Telehealth: Payer: Self-pay | Admitting: Internal Medicine

## 2023-03-05 DIAGNOSIS — R0902 Hypoxemia: Secondary | ICD-10-CM

## 2023-03-05 DIAGNOSIS — J9621 Acute and chronic respiratory failure with hypoxia: Secondary | ICD-10-CM

## 2023-03-05 NOTE — Addendum Note (Signed)
Addended byMichail Jewels, Caine Barfield M on: 03/05/2023 10:00 AM   Modules accepted: Orders

## 2023-03-05 NOTE — Telephone Encounter (Signed)
Pt needs a prescription for oxygen sent into [facility]

## 2023-03-16 DIAGNOSIS — R0902 Hypoxemia: Secondary | ICD-10-CM | POA: Diagnosis not present

## 2023-03-16 DIAGNOSIS — G4730 Sleep apnea, unspecified: Secondary | ICD-10-CM | POA: Diagnosis not present

## 2023-03-17 NOTE — Telephone Encounter (Signed)
Can we check into o2 order that was placed 10/31.

## 2023-03-24 ENCOUNTER — Telehealth: Payer: Self-pay | Admitting: Urology

## 2023-03-24 NOTE — Telephone Encounter (Signed)
Called and LVM for pt to call us back and sch labs.

## 2023-03-24 NOTE — Telephone Encounter (Signed)
-----   Message from [facility] Christal N sent at 03/24/2023  8:38 AM EST ----- Regarding: FW: PSa prior to appt Please call pt to get him in for a PSA prior to his follow up appt.   Thanks,  Christal ----- Message ----- From: Carolin Coy, CMA Sent: 03/22/2023  12:00 AM EST To: Carolin Coy, CMA Subject: PSa prior to appt                              Pt needs a PSA drawn one week prior to appt. Appt 04/06/23.

## 2023-03-29 NOTE — Telephone Encounter (Signed)
His ONO on RA 03/16/23 is normal (assuming he was in fact on RA as reported) so he does not qualify for noct 02   If not done on RA needs to be repeated on RA

## 2023-03-30 NOTE — Telephone Encounter (Addendum)
Called patient.  Patient states he did ONO on 2L oxygen.  Informed patient per Dr. Sherene Sires, he will need to repeat the test on room air.  Called Mitch with [employer].  He will email copy of ONO for chart copy.  Order in EPIC does state " ON ROOM AIR".    Instructed patient when he repeats the ONO test, to be sure to only do test on room air.  Will reorder the test on room air.

## 2023-04-06 ENCOUNTER — Ambulatory Visit: Payer: Medicare Other | Admitting: Urology

## 2023-04-13 ENCOUNTER — Encounter: Payer: Self-pay | Admitting: Internal Medicine

## 2023-05-02 ENCOUNTER — Other Ambulatory Visit: Payer: Self-pay | Admitting: Acute Care

## 2023-05-02 DIAGNOSIS — Z87891 Personal history of nicotine dependence: Secondary | ICD-10-CM

## 2023-05-02 DIAGNOSIS — Z122 Encounter for screening for malignant neoplasm of respiratory organs: Secondary | ICD-10-CM

## 2023-05-10 ENCOUNTER — Other Ambulatory Visit: Payer: Self-pay | Admitting: Internal Medicine

## 2023-05-12 NOTE — Progress Notes (Signed)
 Timothy [name], male    DOB: Nov 28, 1955   MRN: [id number]   Brief patient profile:   47 yowm MM/quit smoking 2010 with GOLD 3 criteria 2015 maint on symbicort  160 2bid and losing ground with ex tol so referred to pulmonary clinic 05/09/2020 by Dr   Vicci   Retired journalist, newspaper    History of Present Illness  05/09/2020  Pulmonary/ 1st office eval/[facility] symb maint / insurance would not pay spiriva  Chief Complaint  Patient presents with   Pulmonary Consult     Referred by Dr Barnie Vicci. Pt seen here last in 2015 for COPD. He c/o increased SOB over the past 5 years. He states he gets SOB walking up hill and not too far.  He is using his albuterol  inhaler at 4-6 x per wk on average.    Dyspnea:  Grocery shopping limit is [employer] - can't do [employer] anymore, mailbox is 50 ft and no hill steps at slower pace = MMRC3 = can't walk 100 yards even at a slow pace at a flat grade s stopping due to sob   Cough: am congestion/ white  Sleep: on side two pillows flat bed  SABA use: saba hfa  rec Plan A = Automatic = Always=    Stop symbicort  and start Breztri  Take 2 puffs first thing in am and then another 2 puffs about 12 hours later.  Work on inhaler technique:  Plan B = Backup (to supplement plan A, not to replace it) Only use your albuterol  inhaler as a rescue medication Plan C = Crisis (instead of Plan B but only if Plan B stops working) - only use your albuterol  nebulizer if you first try Plan B and it fails to help > ok to use the nebulizer up to every 4 hours but if start needing it regularly call for immediate appointment   - 12/18/2020   patient walked at a slow pace x 3 laps @ 250 ft each  and oxygen dropped to 87 % during 2lap and oxygen was placed and patient come back up to 93% on 2 liters> ordered   08/06/21   >>>  Best fit eval for amb 02 done - did ok on 3lpm pulsed at >90% but rec  4 lpm pulse with higher levels of ex    09/03/2021  f/u ov/Atara Paterson re: COPD GOLD 3  maint on breztri   2bid   Chief Complaint  Patient presents with   Follow-up    Breathing is unchanged. He did not bring his o2 today and says he uses this at home sometimes if needed. He is using his albuterol  inhaler about 3 x per wk.   Dyspnea:  does better slow pace, refusing amb 02 to date Mb is 50 ft flat / greenhouse is slt downhill an always out of breath on way back, does not prechallenge with saba as rec  Cough: none  Sleeping: flat one pillow  SABA use: avg  once a day  02:  not really wearing at hs  Covid status:   vax x 3  Rec We will see about you changing companies when your 5 years is up Make sure you check your oxygen saturation  AT  your highest level of activity (NOT after you stop)   to be sure it stays over 90%  Ok to try albuterol  15 min before an activity (on alternating days - like starter fluid)  that you know would usually make you short of breath Please  schedule a follow up visit in 6  months but call sooner if needed     03/03/2022  f/u ov/Larone Kliethermes re: GOLD 3   maint on breztri    Chief Complaint  Patient presents with   Follow-up    COPD. No sx noted per pt   Dyspnea:  seems to help to use saba before activity  Cough: none  Sleeping:no cc  SABA use: twice daily  02: prn  Lung cancer screening :  referred   [facility] 06/11/22   RADS  2 with mucus obst peripheral RLL   03/04/2023  f/u ov/Donnielle Addison re: GOLD 3 copd/ on 02 prn  maint on breztri    Chief Complaint  Patient presents with   Follow-up    Dyspnea especially during the night x 3 months.  Dyspnea:  no longer going to greenhouse and back now has to stop  Cough: normal am cough  x 30 min > mucoid frothing  Sleeping: bed is flat 2 pillows on side no problem wearing 02  SABA use: couple of times a day  02: 2lpm hs and prn daytime not much > very confusing story about working with [facility] and what he needs to qualify for 02 Lung cancer screening :  q February Rec Pantoprazole  (protonix ) 40 mg (same as 2 nexium)    Take  30-60 min  before first meal of the day and Pepcid  (famotidine )  20 mg after supper until return to office - this is the best way to tell whether stomach acid is contributing to your problem.   GERD diet reviewed, bed blocks rec   For cough/ congestion >   mucinex  D  up to  every 12 hours as needed  My office will be contacting you by phone for referral for overnight oximetry on room air   Please schedule a follow up office visit in 6 weeks, call sooner if needed with all medications /inhalers/ solutions in hand    05/13/2023  f/u ov/Haile Toppins re:   GOLD 3 COPD / 02 dep hs and prn  maint on breztri   did not  bring meds  Chief Complaint  Patient presents with   Follow-up    SOB with any exertion is persistent.   Dyspnea:  no change  Cough: mucinex  d  Sleeping: flat bed 2 pillows s  resp cc  SABA use: few times a day  02: 2lpm hs  - not using 02 as rec  Lung cancer screening :  q feb     No obvious day to day or daytime variability or assoc excess/ purulent sputum or mucus plugs or hemoptysis or cp or chest tightness, subjective wheeze or overt  hb symptoms.    Also denies any obvious fluctuation of symptoms with weather or environmental changes or other aggravating or alleviating factors except as outlined above   No unusual exposure hx or h/o childhood pna/ asthma or knowledge of premature birth.  Current Allergies, Complete Past Medical History, Past Surgical History, Family History, and Social History were reviewed in [employer] record.  ROS  The following are not active complaints unless bolded Hoarseness, sore throat, dysphagia, dental problems, itching, sneezing,  nasal congestion or discharge of excess mucus or purulent secretions, ear ache,   fever, chills, sweats, unintended wt loss or wt gain, classically pleuritic or exertional cp,  orthopnea pnd or arm/hand swelling  or leg swelling, presyncope, palpitations, abdominal pain, anorexia, nausea, vomiting, diarrhea  or  change in bowel habits or change  in bladder habits, change in stools or change in urine, dysuria, hematuria,  rash, arthralgias, visual complaints, headache, numbness, weakness or ataxia or problems with walking or coordination,  change in mood or  memory.        Current Meds  Medication Sig   acetaminophen  (TYLENOL ) 500 MG tablet Take 500-1,000 mg by mouth every 8 (eight) hours as needed for mild pain or headache.    albuterol  (VENTOLIN  HFA) 108 (90 Base) MCG/ACT inhaler Inhale 2 puffs into the lungs every 6 (six) hours as needed for wheezing or shortness of breath.   amLODipine  (NORVASC ) 5 MG tablet TAKE 1 TABLET(5 MG) BY MOUTH DAILY   aspirin  EC 81 MG tablet Take 1 tablet (81 mg total) by mouth daily.   atorvastatin  (LIPITOR ) 80 MG tablet Take 1 tablet (80 mg total) by mouth at bedtime.   bisoprolol  (ZEBETA ) 5 MG tablet TAKE 1/2 TABLET(2.5 MG) BY MOUTH AT BEDTIME   BREZTRI  AEROSPHERE 160-9-4.8 MCG/ACT AERO INHALE 2 PUFFS INTO THE LUNGS IN THE MORNING AND AT BEDTIME   famotidine  (PEPCID ) 20 MG tablet One daily after supper   finasteride  (PROSCAR ) 5 MG tablet Take 1 tablet (5 mg total) by mouth daily.   nitroGLYCERIN  (NITROSTAT ) 0.4 MG SL tablet Place 1 tablet (0.4 mg total) under the tongue every 5 (five) minutes as needed for chest pain. NEED OV.   OXYGEN Inhale 2 L into the lungs at bedtime.   pantoprazole  (PROTONIX ) 40 MG tablet Take 1 tablet (40 mg total) by mouth daily. Take 30-60 min before first meal of the day   ticagrelor  (BRILINTA ) 90 MG TABS tablet TAKE 1 TABLET(90 MG) BY MOUTH TWICE DAILY            Past Medical History:  Diagnosis Date   Altered mental status, improved at discharge 06/19/2013   Bronchitis, chronic (HCC) 06/19/2013   CAD (coronary artery disease)    a. s/p cardiac arrest in 06/2013 with DES to RCA b. 02/2020: Inferior STEMI and cath showing total occlusion of mid-RCA treated with DES placement.    Cancer [facility])    bladder   Cardiac arrest, hypothermic  protocol 06/12/2013   COPD exacerbation (HCC) 06/12/2013   GERD (gastroesophageal reflux disease)    Hyperlipidemia LDL goal < 70 06/19/2013   Hypokalemia, replaced 06/19/2013   Hypomagnesemia, replaced 06/19/2013   Hypotension, requiring levophed  06/19/2013   NSTEMI (non-ST elevated myocardial infarction), DES to RCA 06/13/2013   Pre-diabetes    Tobacco abuse 06/19/2013        Objective:   Wts  05/13/2023         131  03/04/2023     131  03/03/2022     126  09/03/2021         125   06/30/2021       125  12/18/2020       126  07/01/2020       132   06/20/20 133 lb (60.3 kg)  05/09/20 131 lb (59.4 kg)  03/07/20 138 lb (62.6 kg)     Vital signs reviewed  05/13/2023  - Note at rest 02 sats  95% on RA   General appearance:    chronically ill somber amb wm nad    HEENT :  Oropharynx  clear   Nasal turbinates mild non-specific turbinate edema    NECK :  without JVD/Nodes/TM/ nl carotid upstrokes bilaterally   LUNGS: no acc muscle use,  Mod barrel  contour chest wall with bilateral  Distant bs s audible wheeze and  without cough on insp or exp maneuvers and mod  Hyperresonant  to  percussion bilaterally     CV:  RRR  no s3 or murmur or increase in P2, and no edema   ABD:  soft and nontender with pos mid insp Hoover's  in the supine position. No bruits or organomegaly appreciated, bowel sounds nl  MS:   Ext warm without deformities or   obvious joint restrictions , calf tenderness, cyanosis or clubbing  SKIN: warm and dry without lesions    NEURO:  alert, approp, nl sensorium with  no motor or cerebellar deficits apparent.             CXR PA and Lateral:   03/04/2023 :    I personally reviewed images and agree with radiology impression as follows:    1. Ill-defined opacity in the right middle lobe, suspicious for pneumonia. Recommend radiographic follow-up to resolution. 2. Moderate emphysema and central bronchial thickening. 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

## 2023-05-13 ENCOUNTER — Encounter: Payer: Self-pay | Admitting: Internal Medicine

## 2023-05-13 ENCOUNTER — Ambulatory Visit: Payer: Medicare Other | Admitting: Internal Medicine

## 2023-05-13 VITALS — BP 138/72 | HR 80 | Temp 97.7°F | Ht 66.0 in | Wt 131.4 lb

## 2023-05-13 DIAGNOSIS — J449 Chronic obstructive pulmonary disease, unspecified: Secondary | ICD-10-CM

## 2023-05-13 DIAGNOSIS — R0902 Hypoxemia: Secondary | ICD-10-CM

## 2023-05-13 DIAGNOSIS — Z87891 Personal history of nicotine dependence: Secondary | ICD-10-CM | POA: Diagnosis not present

## 2023-05-13 MED ORDER — PANTOPRAZOLE SODIUM 40 MG PO TBEC: 40.0000 mg | DELAYED_RELEASE_TABLET | Freq: Every day | ORAL | 3 refills | Status: DC

## 2023-05-13 MED ORDER — BREZTRI AEROSPHERE 160-9-4.8 MCG/ACT IN AERO: 2.0000 | INHALATION_SPRAY | Freq: Two times a day (BID) | RESPIRATORY_TRACT | Status: DC

## 2023-05-13 NOTE — Patient Instructions (Addendum)
 No change in medications   Continue 2lpm at bedtime and as needed during the day with target sats > 90% by pulse oximeter   Please schedule a follow up visit in 3 months but call sooner if needed   Add: will check with rep re trial of ohtuvayre

## 2023-05-14 NOTE — Assessment & Plan Note (Signed)
 F/u LDSCT q feb reviewed with pt

## 2023-05-14 NOTE — Assessment & Plan Note (Addendum)
 Reported 07/01/2020  - 12/18/2020   patient walked at a slow pace x 3 laps @ 250 ft each  and oxygen dropped to 87 % during 2lap and oxygen was placed and patient come back up to 93% on 2 liters> ordered .  Best fit eval for amb 02 done 08/06/21  And did ok on 3lpm pulsed at >90% but rec  4 lpm pulse with higher levels of ex > turned down for POC by [facility]  03/04/2023 Patient Saturations on Room Air at Rest = 94%  Patient Saturations on Room Air while Ambulating = 87%  Patient Saturations on 2 Liters of POC oxygen while Ambulating = 93% - ONO on 2lpm 03/16/23  desats x 7 sec only  > no change rx    Again advised: Make sure you check your oxygen saturation  AT  your highest level of activity (not after you stop)   to be sure it stays over 90% and adjust  02 flow upward to maintain this level if needed but remember to turn it back to previous settings when you stop (to conserve your supply).   Pt still not understanding how to use amb 02/ did not bring it or meds to office as rec to assure he's on the right meds/ settings/ discussed limitations of his care if he can't meet me half way in this regard.

## 2023-05-14 NOTE — Assessment & Plan Note (Addendum)
 MM/quit smoking in 2010  - HFA 75% p coaching 09/01/13 > started on symbicort  160 2 bid  - Spirometry 09/15/13  FEV1 1.67 (49%) ratio 44 % - 05/09/2020   breztri  2bid -  05/09/2020   Walked RA  2 laps @ approx 251ft each @ avg pace  stopped due sob / sats 93%  - alpha one AT phenotype  MM,  Level 182  - Allergy profile 05/09/20 >  Eos 0.6/  IgE  162  - PFT's  07/01/2020  FEV1 1.35 (46 % ) ratio 0.48 p 6 % improvement from saba p breztri  x 2 prior to study with DLCO  9.40 (40%) corrects to 1.97 (45%)  for alv volume and FV curve classic concavity  - 03/04/2023 pseudowheeze on exam/ assoc nasal congestion >> max gerd rx and mucienx D plus 6 d pred - 03/04/2023  After extensive coaching inhaler device,  effectiveness =    80%  - ONO on 2lpm 03/16/23  = 7 sec desats > no change rx     Group D (now reclassified as E) in terms of symptom/risk and laba/lama/ICS  therefore appropriate rx at this point >>>  breztri  and approp saba   Might be a candidate for trial ohtuvayre  neb if insurance will cover  - will check with rep    Each maintenance medication was reviewed in detail including emphasizing most importantly the difference between maintenance and prns and under what circumstances the prns are to be triggered using an action plan format where appropriate.  Total time for H and P, chart review, counseling, reviewing hfa/02 device(s) and generating customized AVS unique to this office visit / same day charting = 32 min

## 2023-05-27 ENCOUNTER — Ambulatory Visit: Payer: Medicare Other | Admitting: Internal Medicine

## 2023-06-10 ENCOUNTER — Other Ambulatory Visit: Payer: Self-pay | Admitting: Internal Medicine

## 2023-06-10 DIAGNOSIS — I2510 Atherosclerotic heart disease of native coronary artery without angina pectoris: Secondary | ICD-10-CM

## 2023-06-10 DIAGNOSIS — I10 Essential (primary) hypertension: Secondary | ICD-10-CM

## 2023-06-15 ENCOUNTER — Ambulatory Visit
Admission: RE | Admit: 2023-06-15 | Discharge: 2023-06-15 | Disposition: A | Discharge status: Home / Self Care | Payer: Medicare Other | Source: Ambulatory Visit | Attending: Acute Care | Admitting: Acute Care

## 2023-06-15 DIAGNOSIS — Z122 Encounter for screening for malignant neoplasm of respiratory organs: Secondary | ICD-10-CM

## 2023-06-15 DIAGNOSIS — Z87891 Personal history of nicotine dependence: Secondary | ICD-10-CM | POA: Diagnosis not present

## 2023-06-28 ENCOUNTER — Encounter: Payer: Self-pay | Admitting: Internal Medicine

## 2023-06-28 ENCOUNTER — Ambulatory Visit: Payer: Medicare Other | Attending: Internal Medicine | Admitting: Internal Medicine

## 2023-06-28 VITALS — BP 124/75 | HR 102 | Ht 66.0 in | Wt 132.0 lb

## 2023-06-28 DIAGNOSIS — J449 Chronic obstructive pulmonary disease, unspecified: Secondary | ICD-10-CM | POA: Diagnosis not present

## 2023-06-28 DIAGNOSIS — I10 Essential (primary) hypertension: Secondary | ICD-10-CM

## 2023-06-28 DIAGNOSIS — Z9981 Dependence on supplemental oxygen: Secondary | ICD-10-CM

## 2023-06-28 DIAGNOSIS — I25118 Atherosclerotic heart disease of native coronary artery with other forms of angina pectoris: Secondary | ICD-10-CM | POA: Diagnosis not present

## 2023-06-28 DIAGNOSIS — D72829 Elevated white blood cell count, unspecified: Secondary | ICD-10-CM | POA: Diagnosis not present

## 2023-06-28 DIAGNOSIS — J9611 Chronic respiratory failure with hypoxia: Secondary | ICD-10-CM

## 2023-06-28 DIAGNOSIS — K4090 Unilateral inguinal hernia, without obstruction or gangrene, not specified as recurrent: Secondary | ICD-10-CM

## 2023-06-28 DIAGNOSIS — R7303 Prediabetes: Secondary | ICD-10-CM | POA: Diagnosis not present

## 2023-06-28 LAB — POCT GLYCOSYLATED HEMOGLOBIN (HGB A1C): HbA1c, POC (prediabetic range): 6 % (ref 5.7–6.4)

## 2023-06-28 LAB — GLUCOSE, POCT (MANUAL RESULT ENTRY): POC Glucose: 128 mg/dL — AB (ref 70–99)

## 2023-06-28 NOTE — Patient Instructions (Signed)
 Groin Hernia (Inguinal Hernia) in Adults: What to Know  A hernia happens when an organ or tissue inside your body pushes out through a weak spot in the muscles of your belly. This makes a bulge. A groin hernia, or inguinal hernia, is found in the groin, which is the area where your leg meets your lower belly. If you're male, it may be in your scrotum. If you're male, it may be in the folds of skin around your vagina. There are three types: Reducible. This means the hernia can be pushed back into your belly. Incarcerated. This means it can't be pushed back into your belly. Strangulated. This means it can't be pushed back into your belly and blood flow is cut off to the tissues inside it. If you get this type of hernia, you'll need surgery right away. What are the causes? A groin hernia may happen when you strain your lower belly muscles, such as when you: Lift a heavy object. Strain to poop. Cough. You can be born with a groin hernia, or it can form over time. What increases the risk? You're more likely to get a groin hernia if: You're male. You're pregnant or have been pregnant. You're 50 years or older. You've had a groin hernia or belly surgery before. You smoke. You're overweight. You work at a job where you need to stand a lot or lift heavy things. What are the signs or symptoms? Symptoms may depend on how big the hernia is. If it's small, you may not have symptoms. If it's bigger, you may have: A bulge near your groin or genitals. Pain or burning in your groin. A dull ache or feeling of pressure in your groin. If blood flow is cut off to the tissues inside the hernia, you may also: Feel pain and tenderness when you touch the bulge. The skin over it may turn red or purple. Have a fever. Throw up or feel like you may throw up. Have trouble pooping or passing gas. How is this diagnosed? You may be diagnosed based on your symptoms, medical history, and an exam. During the exam,  you may be asked to cough or strain to force the bulge to stick out. Your health care provider may try to push the bulge back in. You may also need tests. These may include: An ultrasound. An MRI. A CT scan. How is this treated? Treatment depends on how big the hernia is and what symptoms you have. You may need: To be watched to see if the bulge grows bigger. Surgery. This may be done if the hernia is big or if you have symptoms. Follow these instructions at home: Lifestyle Ask if it's OK for you to lift. Try not to stand for long periods of time. Do not smoke, vape, or use nicotine or tobacco. Stay at a healthy weight. Try not to do things that put pressure on your hernia. Preventing trouble pooping You may need to take these steps to help prevent or treat trouble pooping (constipation): Take medicines to help you poop. Eat foods high in fiber, like beans, whole grains, and fresh fruits and vegetables. Drink more fluids as told. General instructions Try to push the hernia back in place by very gently pressing on it while lying down. Do not try to force it back in if it won't push in easily. Watch your hernia for any changes in: Shape. Size. Color. Take your medicines only as told. Contact a health care provider if: You have a  fever. You have new symptoms. Your symptoms get worse. You can't poop or pass gas. Get help right away if: Your bulge: Starts to hurt a lot. Changes color. You have sudden pain in your scrotum, or the size of your scrotum changes all of a sudden. You can't gently push the hernia back in place. You keep throwing up or feeling like you need to throw up. These symptoms may be an emergency. Call 911 right away. Do not wait to see if the symptoms will go away. Do not drive yourself to the hospital. This information is not intended to replace advice given to you by your health care provider. Make sure you discuss any questions you have with your health care  provider. Document Revised: 12/17/2022 Document Reviewed: 12/17/2022 Elsevier Patient Education  2024 [employer].

## 2023-06-28 NOTE — Progress Notes (Signed)
 Patient ID: REA RESER, male    DOB: 11-21-1955  MRN: [id number]  CC: Hypertension (HTN & prediabetes f/u. /Pt informed by urologist of hernia on R side of groin/Already received flu vax.)   Subjective: Timothy [name] is a 68 y.o. male who presents for chronic ds management. His concerns today include:  hx of HTN, cardiac arrest vfib/vtach 06/2013, CAD with stent to RCA, COPD Gold stage III, former smoker, pre-DM, lung nodule on CT 08/2015, Elev PSA (neg prostate bx 07/2022) and bladder CA.   Discussed the use of AI scribe software for clinical note transcription with the patient, who gave verbal consent to proceed.  History of Present Illness   The patient, with a history of COPD, heart disease, prediabetes, and a history of bladder cancer, presents for a routine follow-up.  COPD:  He reports chronic shortness of breath which is worse if he exerts himself but manageable at rest. He is using Breztri inhaler twice daily and albuterol as needed. He also reports a productive cough, particularly in the mornings. He uses oxygen at night and as needed during the day, which has helped alleviate episodes of nocturnal dyspnea and sweating. -Saw his pulmonologist Dr. Sherene Sires last wk  HTN/CAD: In terms of his heart disease, he is on amlodipine 5mg  and bisoprolol 5mg , atorvastatin 80 mg daily, aspirin 81 mg daily and Brilinta 90 mg twice a day. He does not regularly monitor his blood pressure at home but has a device. His diet is somewhat low in salt, but he admits to occasional indulgences. -no CP or LE edema  PreDM:  Results for orders placed or performed in visit on 06/28/23  POCT glucose (manual entry)   Collection Time: 06/28/23  2:10 PM  Result Value Ref Range   POC Glucose 128 (A) 70 - 99 mg/dl  POCT glycosylated hemoglobin (Hb A1C)   Collection Time: 06/28/23  2:14 PM  Result Value Ref Range   Hemoglobin A1C     HbA1c POC (<> result, manual entry)     HbA1c, POC (prediabetic range) 6.0 5.7  - 6.4 %   HbA1c, POC (controlled diabetic range)    His prediabetes is managed through diet, but he acknowledges the difficulty in maintaining a healthy diet, particularly in reducing sugar and white carbohydrates.   Inguinal Hernia:  He also has a right-sided groin hernia, which he noticed sometime last year. It occasionally bulges out and is noticeable when he coughs.  Denies any pain.  He has a history of bladder cancer, which has been treated and monitored with regular cystoscopies. His last visit was last year, and he was told he would not need another cystoscopy for five years unless symptoms develop.  Hx of leukocytosis: worked up by Dr. Leonides Schanz 07/2022.  He had negative JAK2 and BCR/ABL. He was supposed to follow-up but chose not to.  HM:  He has received one dose of the shingles vaccine and is due for the second dose in two to six months. He also has a Medicare wellness visit scheduled for the following day.     Patient Active Problem List   Diagnosis Date Noted   Colon cancer screening 11/12/2022   Positive colorectal cancer screening using Cologuard test 11/12/2022   Benign neoplasm of ascending colon 11/12/2022   Influenza A 04/09/2021   Acute on chronic respiratory failure with hypoxia (HCC) 04/09/2021   COPD exacerbation (HCC) 04/03/2021   COPD (chronic obstructive pulmonary disease) (HCC) 04/02/2021   Exercise hypoxemia 07/02/2020  Acute STEMI of inferior wall  02/24/2020   Malignant neoplasm of urinary bladder (HCC) 02/23/2017   Lung nodule 11/20/2016   Gastroesophageal reflux disease without esophagitis 11/20/2016   Prediabetes 12/29/2013   Dental cavities 12/29/2013   DOE (dyspnea on exertion) 08/30/2013   Essential hypertension 08/03/2013   CAD S/P percutaneous coronary angioplasty 06/19/2013   Hyperlipidemia with target LDL less than 70 06/19/2013   Former cigarette smoker 06/19/2013   NSTEMI (non-ST elevated myocardial infarction), DES to RCA 06/13/2013   COPD  GOLD 3 06/12/2013     Current Outpatient Medications on File Prior to Visit  Medication Sig Dispense Refill   acetaminophen (TYLENOL) 500 MG tablet Take 500-1,000 mg by mouth every 8 (eight) hours as needed for mild pain or headache.      albuterol (VENTOLIN HFA) 108 (90 Base) MCG/ACT inhaler Inhale 2 puffs into the lungs every 6 (six) hours as needed for wheezing or shortness of breath. 8 g 12   amLODipine (NORVASC) 5 MG tablet TAKE 1 TABLET(5 MG) BY MOUTH DAILY 30 tablet 0   aspirin EC 81 MG tablet Take 1 tablet (81 mg total) by mouth daily. 90 tablet 2   atorvastatin (LIPITOR) 80 MG tablet TAKE 1 TABLET(80 MG) BY MOUTH AT BEDTIME 30 tablet 0   bisoprolol (ZEBETA) 5 MG tablet TAKE 1/2 TABLET(2.5 MG) BY MOUTH AT BEDTIME 45 tablet 2   BREZTRI AEROSPHERE 160-9-4.8 MCG/ACT AERO INHALE 2 PUFFS INTO THE LUNGS IN THE MORNING AND AT BEDTIME 10.7 g 5   Budeson-Glycopyrrol-Formoterol (BREZTRI AEROSPHERE) 160-9-4.8 MCG/ACT AERO Inhale 2 puffs into the lungs in the morning and at bedtime.     famotidine (PEPCID) 20 MG tablet One daily after supper     finasteride (PROSCAR) 5 MG tablet Take 1 tablet (5 mg total) by mouth daily. 90 tablet 3   nitroGLYCERIN (NITROSTAT) 0.4 MG SL tablet Place 1 tablet (0.4 mg total) under the tongue every 5 (five) minutes as needed for chest pain. NEED OV. 25 tablet 2   OXYGEN Inhale 2 L into the lungs at bedtime.     pantoprazole (PROTONIX) 40 MG tablet Take 1 tablet (40 mg total) by mouth daily. Take 30-60 min before first meal of the day 90 tablet 3   ticagrelor (BRILINTA) 90 MG TABS tablet TAKE 1 TABLET(90 MG) BY MOUTH TWICE DAILY 180 tablet 3   No current facility-administered medications on file prior to visit.    Allergies  Allergen Reactions   Ace Inhibitors Other (See Comments)    Caused wheezing   Aspirin Swelling and Other (See Comments)    Lips swell- still takes approx 2 times a week in 2021 Low dose ASA is fine, no problems   Ibuprofen Swelling and  Other (See Comments)    Lips swell   Losartan Other (See Comments)    Leg cramps    Social History   Socioeconomic History   Marital status: Divorced    Spouse name: Not on file   Number of children: Not on file   Years of education: Not on file   Highest education level: Not on file  Occupational History   Not on file  Tobacco Use   Smoking status: Former    Current packs/day: 0.00    Average packs/day: 2.0 packs/day for 38.0 years (76.0 ttl pk-yrs)    Types: Cigarettes    Start date: 07/11/1975    Quit date: 07/10/2013    Years since quitting: 9.9   Smokeless tobacco: Never  Vaping Use   Vaping status: Never Used  Substance and Sexual Activity   Alcohol use: No   Drug use: No    Types: Marijuana   Sexual activity: Yes  Other Topics Concern   Not on file  Social History Narrative   Not on file   Social Drivers of Health   Financial Resource Strain: Medium Risk (02/25/2023)   Overall Financial Resource Strain (CARDIA)    Difficulty of Paying Living Expenses: Somewhat hard  Food Insecurity: No Food Insecurity (02/25/2023)   Hunger Vital Sign    Worried About Running Out of Food in the Last Year: Never true    Ran Out of Food in the Last Year: Never true  Transportation Needs: No Transportation Needs (02/25/2023)   PRAPARE - Administrator, Civil Service (Medical): No    Lack of Transportation (Non-Medical): No  Physical Activity: Inactive (02/25/2023)   Exercise Vital Sign    Days of Exercise per Week: 0 days    Minutes of Exercise per Session: 0 min  Stress: No Stress Concern Present (02/25/2023)   [employer] of Occupational Health - Occupational Stress Questionnaire    Feeling of Stress : Not at all  Social Connections: Moderately Isolated (02/25/2023)   Social Connection and Isolation Panel [NHANES]    Frequency of Communication with Friends and Family: More than three times a week    Frequency of Social Gatherings with Friends and Family:  More than three times a week    Attends Religious Services: Never    Database administrator or Organizations: No    Attends Banker Meetings: Never    Marital Status: Living with partner  Intimate Partner Violence: Not At Risk (02/25/2023)   Humiliation, Afraid, Rape, and Kick questionnaire    Fear of Current or Ex-Partner: No    Emotionally Abused: No    Physically Abused: No    Sexually Abused: No    Family History  Problem Relation Age of Onset   Hypertension Other    Colon cancer Neg Hx    Stomach cancer Neg Hx    Esophageal cancer Neg Hx     Past Surgical History:  Procedure Laterality Date   cardiac sstent     COLONOSCOPY WITH PROPOFOL N/A 11/12/2022   Procedure: COLONOSCOPY WITH PROPOFOL;  Surgeon: Hilarie Fredrickson, MD;  Location: [facility];  Service: Gastroenterology;  Laterality: N/A;   CORONARY ANGIOPLASTY WITH STENT PLACEMENT  06/12/2013   [facility] [place] DES to RCA, NSTEMI   CORONARY STENT INTERVENTION N/A 02/24/2020   Procedure: CORONARY STENT INTERVENTION;  Surgeon: Lyn Records, MD;  Location: [facility];  Service: Cardiovascular;  Laterality: N/A;   CORONARY/GRAFT ACUTE MI REVASCULARIZATION N/A 02/24/2020   Procedure: Coronary/Graft Acute MI Revascularization;  Surgeon: Lyn Records, MD;  Location: [facility];  Service: Cardiovascular;  Laterality: N/A;   CYSTOSCOPY W/ RETROGRADES Left 03/29/2017   Procedure: CYSTOSCOPY WITH LEFT RETROGRADE PYELOGRAM;  Surgeon: Ihor Gully, MD;  Location: [facility];  Service: Urology;  Laterality: Left;   CYSTOSCOPY WITH DIRECT VISION INTERNAL URETHROTOMY N/A 09/15/2022   Procedure: CYSTOSCOPY WITH BALLOON DILATION;  Surgeon: Joline Maxcy, MD;  Location: [facility];  Service: Urology;  Laterality: N/A;   LEFT HEART CATH AND CORONARY ANGIOGRAPHY N/A 02/24/2020   Procedure: LEFT HEART CATH AND CORONARY ANGIOGRAPHY;  Surgeon: Lyn Records, MD;  Location: [facility];  Service: Cardiovascular;   Laterality: N/A;   LEFT  HEART CATHETERIZATION WITH CORONARY ANGIOGRAM N/A 06/12/2013   Procedure: LEFT HEART CATHETERIZATION WITH CORONARY ANGIOGRAM;  Surgeon: Lennette Bihari, MD;  Location: [facility];  Service: Cardiovascular;  Laterality: N/A;   POLYPECTOMY  11/12/2022   Procedure: POLYPECTOMY;  Surgeon: Hilarie Fredrickson, MD;  Location: [facility];  Service: Gastroenterology;;   TRANSURETHRAL RESECTION OF BLADDER TUMOR N/A 03/29/2017   Procedure: TRANSURETHRAL RESECTION OF BLADDER TUMOR (TURBT)/;  Surgeon: Ihor Gully, MD;  Location: [facility];  Service: Urology;  Laterality: N/A;   transurethral ressection of bladder tumor     Dr. Vernie Ammons 03/29/17    ROS: Review of Systems Negative except as stated above  PHYSICAL EXAM: BP 124/75   Pulse (!) 102   Ht 5\' 6"  (1.676 m)   Wt 132 lb (59.9 kg)   SpO2 93%   BMI 21.31 kg/m   Physical Exam   General appearance - alert, well appearing, and in no distress Mental status - normal mood, behavior, speech, dress, motor activity, and thought processes Neck - supple, no significant adenopathy Chest - breath sounds are moderately decreased BL, no wheezes, rales or rhonchi, symmetric air entry Heart - normal rate, regular rhythm, normal S1, S2, no murmurs, rubs, clicks or gallops GU Male - Rt groin: CMA Clarisa present. Pt has a hernia below inguinal ligament RT side about size of small to mid size apple.  None tender to touch Extremities - peripheral pulses normal, no pedal edema, no clubbing or cyanosis     Latest Ref Rng & Units 09/07/2022    1:33 PM 07/27/2022    2:27 PM 06/25/2022   12:32 PM  CMP  Glucose 70 - 99 mg/dL 409  811  93   BUN 8 - 23 mg/dL 12  13  10    Creatinine 0.61 - 1.24 mg/dL 9.14  7.82  9.56   Sodium 135 - 145 mmol/L 140  138  140   Potassium 3.5 - 5.1 mmol/L 4.4  4.4  4.9   Chloride 98 - 111 mmol/L 104  101  99   CO2 22 - 32 mmol/L 27  28  26    Calcium 8.9 - 10.3 mg/dL 9.6  21.3  08.6   Total Protein 6.5 - 8.1 g/dL   8.1  7.3   Total Bilirubin 0.3 - 1.2 mg/dL  0.8  0.4   Alkaline Phos 38 - 126 U/L  90  89   AST 15 - 41 U/L  15  16   ALT 0 - 44 U/L  18  17    Lipid Panel     Component Value Date/Time   CHOL 279 (H) 06/25/2022 1232   TRIG 180 (H) 06/25/2022 1232   HDL 57 06/25/2022 1232   CHOLHDL 4.9 06/25/2022 1232   CHOLHDL 5.6 02/24/2020 1253   VLDL 42 (H) 02/24/2020 1253   LDLCALC 188 (H) 06/25/2022 1232    CBC    Component Value Date/Time   WBC 14.8 (H) 02/25/2023 1452   WBC 15.0 (H) 09/07/2022 1333   RBC 5.71 02/25/2023 1452   RBC 5.50 09/07/2022 1333   HGB 17.5 02/25/2023 1452   HCT 53.3 (H) 02/25/2023 1452   PLT 350 02/25/2023 1452   MCV 93 02/25/2023 1452   MCH 30.6 02/25/2023 1452   MCH 30.2 09/07/2022 1333   MCHC 32.8 02/25/2023 1452   MCHC 32.8 09/07/2022 1333   RDW 13.0 02/25/2023 1452   LYMPHSABS 2.7 02/25/2023 1452   MONOABS 1.7 (H) 07/27/2022 1427  EOSABS 0.2 02/25/2023 1452   BASOSABS 0.1 02/25/2023 1452    ASSESSMENT AND PLAN: 1. Essential hypertension (Primary) At goal.  Continue Bisoprolol and Norvasc - Comprehensive metabolic panel  2. Coronary artery disease of native artery of native heart with stable angina pectoris (HCC) Stable. Continue ASA, Brilinta 90 mg BID, Lipitor 80 mg - Lipid panel  3. Prediabetes Patient advised to eliminate sugary drinks from the diet, cut back on portion sizes especially of white carbohydrates, eat more white lean meat like chicken [place] and seafood instead of beef or pork and incorporate fresh fruits and vegetables into the diet daily. Advise to pick one thing at a time to change in his eating habits and try to do it consistently for 1-2 mths before picking something else - POCT glycosylated hemoglobin (Hb A1C) - POCT glucose (manual entry)  4. COPD GOLD III Continue Breztri twice a day and Albuterol as needed  5. Chronic respiratory failure with hypoxia, on home O2 therapy (HCC) Continue home O2 at nights and as  needed during the day  6. Non-recurrent unilateral inguinal hernia without obstruction or gangrene  No current pain or discomfort. Discussed referral to general surgeon for elective surgery. Patient does not wish to pursue surgical intervention at this time. Advise to be seen in ER if he develops severe pain in this hernia as this may indicate strangulated hernia which becomes an emergency surgery -Advise patient to avoid heavy lifting, straining, pushing, pulling. -Monitor for changes in hernia, including increased size or pain.  7. Leukocytosis, unspecified type - CBC with Differential/Platelet  Advised to get 2ng shingles shot 2-6 mths from the one he had done last wk.  Message sent to my CMA to call [facility] and get info on his recent vaccine.  Patient was given the opportunity to ask questions.  Patient verbalized understanding of the plan and was able to repeat key elements of the plan.   This documentation was completed using Paediatric nurse.  Any transcriptional errors are unintentional.  Orders Placed This Encounter  Procedures   CBC with Differential/Platelet   Comprehensive metabolic panel   Lipid panel   POCT glycosylated hemoglobin (Hb A1C)   POCT glucose (manual entry)     Requested Prescriptions    No prescriptions requested or ordered in this encounter    Return in about 4 months (around 10/26/2023).  Jonah Blue, MD, FACP

## 2023-06-29 ENCOUNTER — Ambulatory Visit: Payer: Medicare Other | Attending: Family Medicine

## 2023-06-29 VITALS — Ht 66.0 in | Wt 132.0 lb

## 2023-06-29 DIAGNOSIS — Z0000 Encounter for general adult medical examination without abnormal findings: Secondary | ICD-10-CM | POA: Diagnosis not present

## 2023-06-29 DIAGNOSIS — Z87891 Personal history of nicotine dependence: Secondary | ICD-10-CM | POA: Diagnosis not present

## 2023-06-29 LAB — CBC WITH DIFFERENTIAL/PLATELET
Basophils Absolute: 0.1 10*3/uL (ref 0.0–0.2)
Basos: 1 %
EOS (ABSOLUTE): 0.2 10*3/uL (ref 0.0–0.4)
Eos: 1 %
Hematocrit: 50.8 % (ref 37.5–51.0)
Hemoglobin: 17.1 g/dL (ref 13.0–17.7)
Immature Grans (Abs): 0.3 10*3/uL — ABNORMAL HIGH (ref 0.0–0.1)
Immature Granulocytes: 2 %
Lymphocytes Absolute: 2.3 10*3/uL (ref 0.7–3.1)
Lymphs: 14 %
MCH: 30.9 pg (ref 26.6–33.0)
MCHC: 33.7 g/dL (ref 31.5–35.7)
MCV: 92 fL (ref 79–97)
Monocytes Absolute: 2.2 10*3/uL — ABNORMAL HIGH (ref 0.1–0.9)
Monocytes: 13 %
Neutrophils Absolute: 11.5 10*3/uL — ABNORMAL HIGH (ref 1.4–7.0)
Neutrophils: 69 %
Platelets: 317 10*3/uL (ref 150–450)
RBC: 5.53 x10E6/uL (ref 4.14–5.80)
RDW: 12.9 % (ref 11.6–15.4)
WBC: 16.5 10*3/uL — ABNORMAL HIGH (ref 3.4–10.8)

## 2023-06-29 LAB — COMPREHENSIVE METABOLIC PANEL
ALT: 26 [IU]/L (ref 0–44)
AST: 18 [IU]/L (ref 0–40)
Albumin: 4.9 g/dL (ref 3.9–4.9)
Alkaline Phosphatase: 122 [IU]/L — ABNORMAL HIGH (ref 44–121)
BUN/Creatinine Ratio: 15 (ref 10–24)
BUN: 16 mg/dL (ref 8–27)
Bilirubin Total: 0.8 mg/dL (ref 0.0–1.2)
CO2: 25 mmol/L (ref 20–29)
Calcium: 10.2 mg/dL (ref 8.6–10.2)
Chloride: 97 mmol/L (ref 96–106)
Creatinine, Ser: 1.04 mg/dL (ref 0.76–1.27)
Globulin, Total: 2.8 g/dL (ref 1.5–4.5)
Glucose: 109 mg/dL — ABNORMAL HIGH (ref 70–99)
Potassium: 4.7 mmol/L (ref 3.5–5.2)
Sodium: 141 mmol/L (ref 134–144)
Total Protein: 7.7 g/dL (ref 6.0–8.5)
eGFR: 79 mL/min/{1.73_m2} (ref >59)

## 2023-06-29 LAB — LIPID PANEL
Chol/HDL Ratio: 3.1 {ratio} (ref 0.0–5.0)
Cholesterol, Total: 157 mg/dL (ref 100–199)
HDL: 50 mg/dL (ref >39)
LDL Chol Calc (NIH): 79 mg/dL (ref 0–99)
Triglycerides: 162 mg/dL — ABNORMAL HIGH (ref 0–149)
VLDL Cholesterol Cal: 28 mg/dL (ref 5–40)

## 2023-06-29 NOTE — Progress Notes (Signed)
 Subjective:   Timothy [name] is a 68 y.o. who presents for a Medicare Wellness preventive visit.  Visit Complete: Virtual I connected with  Timothy [name] on 06/29/23 by a audio enabled telemedicine application and verified that I am speaking with the correct person using two identifiers.  Patient Location: Home  Provider Location: Office/Clinic  I discussed the limitations of evaluation and management by telemedicine. The patient expressed understanding and agreed to proceed.  Vital Signs: Because this visit was a virtual/telehealth visit, some criteria may be missing or patient reported. Any vitals not documented were not able to be obtained and vitals that have been documented are patient reported.  VideoDeclined- This patient declined Librarian, academic. Therefore the visit was completed with audio only.  AWV Questionnaire: No: Patient Medicare AWV questionnaire was not completed prior to this visit.  Cardiac Risk Factors include: advanced age (>25men, >61 women);sedentary lifestyle     Objective:    Today's Vitals   06/29/23 1423  Weight: 132 lb (59.9 kg)  Height: 5\' 6"  (1.676 m)  PainSc: 0-No pain   Body mass index is 21.31 kg/m.     06/29/2023    2:25 PM 11/12/2022    7:42 AM 09/15/2022   11:03 AM 09/07/2022    1:15 PM 05/28/2022   12:32 PM 04/03/2021    5:23 PM 02/24/2020    1:19 PM  Advanced Directives  Does Patient Have a Medical Advance Directive? No No No No No No No  Would patient like information on creating a medical advance directive? No - Patient declined  No - Patient declined No - Patient declined  No - Patient declined No - Patient declined    Current Medications (verified) Outpatient Encounter Medications as of 06/29/2023  Medication Sig   acetaminophen (TYLENOL) 500 MG tablet Take 500-1,000 mg by mouth every 8 (eight) hours as needed for mild pain or headache.    albuterol (VENTOLIN HFA) 108 (90 Base) MCG/ACT inhaler  Inhale 2 puffs into the lungs every 6 (six) hours as needed for wheezing or shortness of breath.   amLODipine (NORVASC) 5 MG tablet TAKE 1 TABLET(5 MG) BY MOUTH DAILY   aspirin EC 81 MG tablet Take 1 tablet (81 mg total) by mouth daily.   atorvastatin (LIPITOR) 80 MG tablet TAKE 1 TABLET(80 MG) BY MOUTH AT BEDTIME   bisoprolol (ZEBETA) 5 MG tablet TAKE 1/2 TABLET(2.5 MG) BY MOUTH AT BEDTIME   BREZTRI AEROSPHERE 160-9-4.8 MCG/ACT AERO INHALE 2 PUFFS INTO THE LUNGS IN THE MORNING AND AT BEDTIME   Budeson-Glycopyrrol-Formoterol (BREZTRI AEROSPHERE) 160-9-4.8 MCG/ACT AERO Inhale 2 puffs into the lungs in the morning and at bedtime.   famotidine (PEPCID) 20 MG tablet One daily after supper   finasteride (PROSCAR) 5 MG tablet Take 1 tablet (5 mg total) by mouth daily.   nitroGLYCERIN (NITROSTAT) 0.4 MG SL tablet Place 1 tablet (0.4 mg total) under the tongue every 5 (five) minutes as needed for chest pain. NEED OV.   OXYGEN Inhale 2 L into the lungs at bedtime.   pantoprazole (PROTONIX) 40 MG tablet Take 1 tablet (40 mg total) by mouth daily. Take 30-60 min before first meal of the day   ticagrelor (BRILINTA) 90 MG TABS tablet TAKE 1 TABLET(90 MG) BY MOUTH TWICE DAILY   No facility-administered encounter medications on file as of 06/29/2023.    Allergies (verified) Ace inhibitors, Aspirin, Ibuprofen, and Losartan   History: Past Medical History:  Diagnosis Date   Altered  mental status, improved at discharge 06/19/2013   Arthritis    Bladder cancer (HCC) 2018   Bronchitis, chronic (HCC) 06/19/2013   CAD (coronary artery disease)    a. s/p cardiac arrest in 06/2013 with DES to RCA b. 02/2020: Inferior STEMI and cath showing total occlusion of mid-RCA treated with DES placement.    Cancer [facility])    bladder   Cardiac arrest, hypothermic protocol 06/12/2013   COPD exacerbation (HCC) 06/12/2013   GERD (gastroesophageal reflux disease)    Hx of leukocytosis    Hyperlipidemia LDL goal < 70  06/19/2013   Hypokalemia, replaced 06/19/2013   Hypomagnesemia, replaced 06/19/2013   Hypotension, requiring levophed 06/19/2013   NSTEMI (non-ST elevated myocardial infarction), DES to RCA 06/13/2013   Pre-diabetes    Tobacco abuse 06/19/2013   Past Surgical History:  Procedure Laterality Date   cardiac sstent     COLONOSCOPY WITH PROPOFOL N/A 11/12/2022   Procedure: COLONOSCOPY WITH PROPOFOL;  Surgeon: Hilarie Fredrickson, MD;  Location: [facility];  Service: Gastroenterology;  Laterality: N/A;   CORONARY ANGIOPLASTY WITH STENT PLACEMENT  06/12/2013   Xience Alpine DES to RCA, NSTEMI   CORONARY STENT INTERVENTION N/A 02/24/2020   Procedure: CORONARY STENT INTERVENTION;  Surgeon: Lyn Records, MD;  Location: [facility] INVASIVE CV LAB;  Service: Cardiovascular;  Laterality: N/A;   CORONARY/GRAFT ACUTE MI REVASCULARIZATION N/A 02/24/2020   Procedure: Coronary/Graft Acute MI Revascularization;  Surgeon: Lyn Records, MD;  Location: [facility] INVASIVE CV LAB;  Service: Cardiovascular;  Laterality: N/A;   CYSTOSCOPY W/ RETROGRADES Left 03/29/2017   Procedure: CYSTOSCOPY WITH LEFT RETROGRADE PYELOGRAM;  Surgeon: Ihor Gully, MD;  Location: [facility];  Service: Urology;  Laterality: Left;   CYSTOSCOPY WITH DIRECT VISION INTERNAL URETHROTOMY N/A 09/15/2022   Procedure: CYSTOSCOPY WITH BALLOON DILATION;  Surgeon: Joline Maxcy, MD;  Location: [facility];  Service: Urology;  Laterality: N/A;   LEFT HEART CATH AND CORONARY ANGIOGRAPHY N/A 02/24/2020   Procedure: LEFT HEART CATH AND CORONARY ANGIOGRAPHY;  Surgeon: Lyn Records, MD;  Location: [facility] CV LAB;  Service: Cardiovascular;  Laterality: N/A;   LEFT HEART CATHETERIZATION WITH CORONARY ANGIOGRAM N/A 06/12/2013   Procedure: LEFT HEART CATHETERIZATION WITH CORONARY ANGIOGRAM;  Surgeon: Lennette Bihari, MD;  Location: [facility] CATH LAB;  Service: Cardiovascular;  Laterality: N/A;   POLYPECTOMY  11/12/2022   Procedure: POLYPECTOMY;  Surgeon: Hilarie Fredrickson, MD;   Location: [facility];  Service: Gastroenterology;;   TRANSURETHRAL RESECTION OF BLADDER TUMOR N/A 03/29/2017   Procedure: TRANSURETHRAL RESECTION OF BLADDER TUMOR (TURBT)/;  Surgeon: Ihor Gully, MD;  Location: [facility];  Service: Urology;  Laterality: N/A;   transurethral ressection of bladder tumor     Dr. Vernie Ammons 03/29/17   Family History  Problem Relation Age of Onset   Hypertension Other    Colon cancer Neg Hx    Stomach cancer Neg Hx    Esophageal cancer Neg Hx    Social History   Socioeconomic History   Marital status: Divorced    Spouse name: Not on file   Number of children: Not on file   Years of education: Not on file   Highest education level: Not on file  Occupational History   Not on file  Tobacco Use   Smoking status: Former    Current packs/day: 0.00    Average packs/day: 2.0 packs/day for 38.0 years (76.0 ttl pk-yrs)    Types: Cigarettes    Start date: 07/11/1975    Quit date: 07/10/2013  Years since quitting: 9.9   Smokeless tobacco: Never  Vaping Use   Vaping status: Never Used  Substance and Sexual Activity   Alcohol use: No   Drug use: No    Types: Marijuana   Sexual activity: Yes  Other Topics Concern   Not on file  Social History Narrative   Not on file   Social Drivers of Health   Financial Resource Strain: Medium Risk (02/25/2023)   Overall Financial Resource Strain (CARDIA)    Difficulty of Paying Living Expenses: Somewhat hard  Food Insecurity: No Food Insecurity (06/29/2023)   Hunger Vital Sign    Worried About Running Out of Food in the Last Year: Never true    Ran Out of Food in the Last Year: Never true  Transportation Needs: No Transportation Needs (06/29/2023)   PRAPARE - Administrator, Civil Service (Medical): No    Lack of Transportation (Non-Medical): No  Physical Activity: Inactive (06/29/2023)   Exercise Vital Sign    Days of Exercise per Week: 0 days    Minutes of Exercise per Session: 0 min  Stress: No  Stress Concern Present (06/29/2023)   [employer] of Occupational Health - Occupational Stress Questionnaire    Feeling of Stress : Only a little  Social Connections: Moderately Isolated (06/29/2023)   Social Connection and Isolation Panel [NHANES]    Frequency of Communication with Friends and Family: More than three times a week    Frequency of Social Gatherings with Friends and Family: More than three times a week    Attends Religious Services: Never    Database administrator or Organizations: No    Attends Engineer, structural: Never    Marital Status: Living with partner    Tobacco Counseling Counseling given: Not Answered    Clinical Intake:  Pre-visit preparation completed: Yes  Pain : No/denies pain Pain Score: 0-No pain     BMI - recorded: 21.31 Nutritional Status: BMI of 19-24  Normal Nutritional Risks: None Diabetes: No  How often do you need to have someone help you when you read instructions, pamphlets, or other written materials from your doctor or pharmacy?: 1 - Never What is the last grade level you completed in school?: HSG  Interpreter Needed?: No  Information entered by :: Jerilynn Feldmeier N. Wardell Pokorski, LPN.   Activities of Daily Living     06/29/2023    2:27 PM 09/07/2022    1:18 PM  In your present state of health, do you have any difficulty performing the following activities:  Hearing? 0   Vision? 0   Difficulty concentrating or making decisions? 0   Walking or climbing stairs? 0   Dressing or bathing? 0   Doing errands, shopping? 0 0  Preparing Food and eating ? N   Using the Toilet? N   In the past six months, have you accidently leaked urine? N   Do you have problems with loss of bowel control? N   Managing your Medications? N   Managing your Finances? N   Housekeeping or managing your Housekeeping? N     Patient Care Team: Marcine Matar, MD as PCP - General (Internal Medicine) Lennette Bihari, MD as PCP - Cardiology  (Cardiology)  Indicate any recent Medical Services you may have received from other than [facility] providers in the past year (date may be approximate).     Assessment:   This is a routine wellness examination for [facility].  Hearing/Vision screen Hearing  Screening - Comments:: Denies hearing difficulties. No hearing aids.   Vision Screening - Comments:: Wears reading glasses -not up to date with routine eye exams.    Goals Addressed             This Visit's Progress    My goal is to maintain my health to the best of my ability.         Depression Screen     06/28/2023    2:12 PM 02/25/2023    2:43 PM 02/25/2023    2:42 PM 10/26/2022    2:47 PM 06/25/2022   11:39 AM 05/28/2022   12:33 PM 04/21/2021   10:56 AM  PHQ 2/9 Scores  PHQ - 2 Score 0 0 0 0 0 0 0  PHQ- 9 Score 3 3  2 3   0    Fall Risk     06/29/2023    2:25 PM 10/26/2022    2:43 PM 06/25/2022   11:39 AM 05/28/2022   12:33 PM 04/21/2021   10:52 AM  Fall Risk   Falls in the past year? 0 0 0 0 0  Number falls in past yr: 0 0 0 0   Injury with Fall? 0 0 0 0   Risk for fall due to : No Fall Risks No Fall Risks No Fall Risks Medication side effect   Follow up Falls prevention discussed;Falls evaluation completed   Falls prevention discussed;Education provided;Falls evaluation completed     MEDICARE RISK AT HOME:  Medicare Risk at Home Any stairs in or around the home?: Yes (back porch) If so, are there any without handrails?: No Home free of loose throw rugs in walkways, pet beds, electrical cords, etc?: Yes Adequate lighting in your home to reduce risk of falls?: Yes Life alert?: Yes (Alexa) Use of a cane, walker or w/c?: No Grab bars in the bathroom?: No Shower chair or bench in shower?: No Elevated toilet seat or a handicapped toilet?: Yes  TIMED UP AND GO:  Was the test performed?  No  Cognitive Function: 6CIT completed    06/29/2023    2:27 PM  MMSE - Mini Mental State Exam  Not completed: Unable to  complete        06/29/2023    2:27 PM 05/28/2022   12:34 PM  6CIT Screen  What Year? 0 points 0 points  What month? 0 points 0 points  What time? 0 points 0 points  Count back from 20 0 points 0 points  Months in reverse 0 points 0 points  Repeat phrase 0 points 0 points  Total Score 0 points 0 points    Immunizations Immunization History  Administered Date(s) Administered   Fluad Trivalent(High Dose 65+) 02/25/2023   Influenza,inj,Quad PF,6+ Mos 01/15/2014, 02/23/2017, 03/05/2020, 04/21/2021   Influenza-Unspecified 02/10/2022   PFIZER(Purple Top)SARS-COV-2 Vaccination 08/24/2019, 09/14/2019, 06/20/2020   Pfizer(Comirnaty)Fall Seasonal Vaccine 12 years and older 02/05/2022   Pneumococcal Conjugate (Pcv15) 04/21/2021   Pneumococcal Polysaccharide-23 06/15/2013   Respiratory Syncytial Virus Vaccine,Recomb Aduvanted(Arexvy) 02/05/2022   Tdap 02/23/2017   Zoster Recombinant(Shingrix) 06/25/2023    Screening Tests Health Maintenance  Topic Date Due   COVID-19 Vaccine (5 - 2024-25 season) 01/03/2023   Lung Cancer Screening  06/12/2023   Zoster Vaccines- Shingrix (2 of 2) 08/20/2023   Medicare Annual Wellness (AWV)  06/28/2024   DTaP/Tdap/Td (2 - Td or Tdap) 02/24/2027   Colonoscopy  11/11/2032   Pneumonia Vaccine 43+ Years old  Completed   INFLUENZA VACCINE  Completed  Hepatitis C Screening  Completed   HPV VACCINES  Aged Out    Health Maintenance  Health Maintenance Due  Topic Date Due   COVID-19 Vaccine (5 - 2024-25 season) 01/03/2023   Lung Cancer Screening  06/12/2023   Health Maintenance Items Addressed: Lung Cancer Screening ordered  Additional Screening:  Vision Screening: Recommended annual ophthalmology exams for early detection of glaucoma and other disorders of the eye.  Dental Screening: Recommended annual dental exams for proper oral hygiene  Community Resource Referral / Chronic Care Management: CRR required this visit?  No   CCM required this  visit?  No     Plan:     I have personally reviewed and noted the following in the patient's chart:   Medical and social history Use of alcohol, tobacco or illicit drugs  Current medications and supplements including opioid prescriptions. Patient is not currently taking opioid prescriptions. Functional ability and status Nutritional status Physical activity Advanced directives List of other physicians Hospitalizations, surgeries, and ER visits in previous 12 months Vitals Screenings to include cognitive, depression, and falls Referrals and appointments  In addition, I have reviewed and discussed with patient certain preventive protocols, quality metrics, and best practice recommendations. A written personalized care plan for preventive services as well as general preventive health recommendations were provided to patient.     Mickeal Needy, LPN   [id number]/[id number]   After Visit Summary: (MyChart) Due to this being a telephonic visit, the after visit summary with patients personalized plan was offered to patient via MyChart   Notes: Please refer to Routing Comments.

## 2023-06-29 NOTE — Patient Instructions (Signed)
 Timothy [name] , Thank you for taking time to come for your Medicare Wellness Visit. I appreciate your ongoing commitment to your health goals. Please review the following plan we discussed and let me know if I can assist you in the future.   Referrals/Orders/Follow-Ups/Clinician Recommendations: Yes; Keep maintaining your health by keeping your appointments with Dr. Alvis Lemmings and any specialists that you may see.  Call us if you need anything.  Have a great year!!!!  This is a list of the screening recommended for you and due dates:  Health Maintenance  Topic Date Due   COVID-19 Vaccine (5 - 2024-25 season) 01/03/2023   Screening for Lung Cancer  06/12/2023   Zoster (Shingles) Vaccine (2 of 2) 08/20/2023   Medicare Annual Wellness Visit  06/28/2024   DTaP/Tdap/Td vaccine (2 - Td or Tdap) 02/24/2027   Colon Cancer Screening  11/11/2032   Pneumonia Vaccine  Completed   Flu Shot  Completed   Hepatitis C Screening  Completed   HPV Vaccine  Aged Out    Advanced directives: (Declined) Advance directive discussed with you today. Even though you declined this today, please call our office should you change your mind, and we can give you the proper paperwork for you to fill out.  Next Medicare Annual Wellness Visit scheduled for next year: Yes

## 2023-06-30 ENCOUNTER — Other Ambulatory Visit: Payer: Self-pay

## 2023-06-30 DIAGNOSIS — Z122 Encounter for screening for malignant neoplasm of respiratory organs: Secondary | ICD-10-CM

## 2023-06-30 DIAGNOSIS — Z87891 Personal history of nicotine dependence: Secondary | ICD-10-CM

## 2023-06-30 NOTE — Progress Notes (Signed)
 Let patient know that he still has elevation in his blood cell count known as the white blood cells.  We may need to get him back in with the blood specialist Dr. Jeanie Sewer whom he saw 07/2022. I have sent Dr. Leonides Schanz a message and I am waiting to hear back from him. Kidney and liver function tests are good. Cholesterol level has significantly improved.  Continue atorvastatin 80 mg daily.

## 2023-07-03 ENCOUNTER — Telehealth: Payer: Self-pay | Admitting: Internal Medicine

## 2023-07-03 NOTE — Telephone Encounter (Signed)
-----   Message from Ulysees Barns IV sent at 06/30/2023  1:25 PM EST ----- Regarding: RE: Leukocytosis Thank you for reaching out. He likely has leukocytosis due to the mutations we saw on the NGS panel. There is no intervention required, but serial monitoring of the WBC can be helpful. Overall his numbers look stable. Please let us know if his WBC >20 or if there is a drop in his other blood cell lines. Otherwise you can continue to monitor with routine CBCs. We can see him back as needed.   Azucena Freed ----- Message ----- From: Marcine Matar, MD Sent: 06/30/2023   9:13 AM EST To: Jaci Standard, MD Subject: Leukocytosis                                   Good morning.  I am the PCP for this patient home use for 07/2022 for persistent leukocytosis.  His BCR ABL was negative; JAK2 sequence showed some abnormalities.  I am not sure if these abnormalities are of any significance. He was supposed to follow-up with you in 6 months but did not keep the appointment.  I repeated his CBC with differential on recent visit.  He still has elevation in WBC with elevated neutrophils with some immature granulocytes.  Can you please take a look and let me know if I need to get this patient back in with you.

## 2023-07-12 ENCOUNTER — Other Ambulatory Visit: Payer: Self-pay | Admitting: Internal Medicine

## 2023-07-12 DIAGNOSIS — I2510 Atherosclerotic heart disease of native coronary artery without angina pectoris: Secondary | ICD-10-CM

## 2023-07-12 DIAGNOSIS — I10 Essential (primary) hypertension: Secondary | ICD-10-CM

## 2023-07-22 ENCOUNTER — Ambulatory Visit: Payer: Medicare Other | Admitting: Internal Medicine

## 2023-07-22 ENCOUNTER — Encounter: Payer: Self-pay | Admitting: Internal Medicine

## 2023-07-22 VITALS — BP 138/68 | HR 94 | Temp 97.7°F | Ht 66.0 in | Wt 130.4 lb

## 2023-07-22 DIAGNOSIS — R0902 Hypoxemia: Secondary | ICD-10-CM

## 2023-07-22 DIAGNOSIS — J449 Chronic obstructive pulmonary disease, unspecified: Secondary | ICD-10-CM

## 2023-07-22 NOTE — Patient Instructions (Addendum)
 I will inquire as to a new nebulizer solution to offer you called Ohtuvayre on a trial basis twice daily - will call you w/in a week   You do not qualify for portable oxygen at this time as lack of oxygen is not the reason your breathing is short   Please schedule a follow up visit in 3 months but call sooner if needed

## 2023-07-22 NOTE — Progress Notes (Unsigned)
 Timothy [name], male    DOB: 1955/11/09   MRN: [id number]   Brief patient profile:   77 yowm MM/quit smoking 2010 with GOLD 3 criteria 2015 maint on symbicort 160 2bid and losing ground with ex tol so referred to pulmonary clinic 05/09/2020 by Dr   Laural Benes   Retired Journalist, newspaper    History of Present Illness  05/09/2020  Pulmonary/ 1st office eval/Timothy [name] symb maint / insurance would not pay spiriva Chief Complaint  Patient presents with   Pulmonary Consult     Referred by Dr Jonah Blue. Pt seen here last in 2015 for COPD. He c/o increased SOB over the past 5 years. He states he gets SOB walking up hill and "not too far".  He is using his albuterol inhaler at 4-6 x per wk on average.    Dyspnea:  Grocery shopping limit is [employer] - can't do [employer] anymore, mailbox is 50 ft and no hill steps at slower pace = MMRC3 = can't walk 100 yards even at a slow pace at a flat grade s stopping due to sob   Cough: am congestion/ white  Sleep: on side two pillows flat bed  SABA use: saba hfa  rec Plan A = Automatic = Always=    Stop symbicort and start Breztri Take 2 puffs first thing in am and then another 2 puffs about 12 hours later.  Work on inhaler technique:  Plan B = Backup (to supplement plan A, not to replace it) Only use your albuterol inhaler as a rescue medication Plan C = Crisis (instead of Plan B but only if Plan B stops working) - only use your albuterol nebulizer if you first try Plan B and it fails to help > ok to use the nebulizer up to every 4 hours but if start needing it regularly call for immediate appointment   - 12/18/2020   patient walked at a slow pace x 3 laps @ 250 ft each  and oxygen dropped to 87 % during 2lap and oxygen was placed and patient come back up to 93% on 2 liters> ordered   08/06/21   >>>  Best fit eval for amb 02 done - did ok on 3lpm pulsed at >90% but rec  4 lpm pulse with higher levels of ex    09/03/2021  f/u ov/Timothy [name] re: COPD GOLD 3  maint on breztri  2bid   Chief Complaint  Patient presents with   Follow-up    Breathing is unchanged. He did not bring his o2 today and says he uses this at home sometimes if needed. He is using his albuterol inhaler about 3 x per wk.   Dyspnea:  does better slow pace, refusing amb 02 to date Mb is 50 ft flat / greenhouse is slt downhill an always out of breath on way back, does not prechallenge with saba as rec  Cough: none  Sleeping: flat one pillow  SABA use: avg  once a day  02:  not really wearing at hs  Covid status:   vax x 3  Rec We will see about you changing companies when your 5 years is up Make sure you check your oxygen saturation  AT  your highest level of activity (NOT after you stop)   to be sure it stays over 90%  Ok to try albuterol 15 min before an activity (on alternating days - like starter fluid)  that you know would usually make you short of breath Please  schedule a follow up visit in 6  months but call sooner if needed     03/03/2022  f/u ov/Timothy [name] re: GOLD 3   maint on breztri   Chief Complaint  Patient presents with   Follow-up    COPD. No sx noted per pt   Dyspnea:  seems to help to use saba before activity  Cough: none  Sleeping:no cc  SABA use: twice daily  02: prn  Lung cancer screening :  referred   [facility] 06/11/22   RADS  2 with mucus obst peripheral RLL   03/04/2023  f/u ov/Timothy [name] re: GOLD 3 copd/ on 02 prn  maint on breztri   Chief Complaint  Patient presents with   Follow-up    Dyspnea especially during the night x 3 months.  Dyspnea:  no longer going to greenhouse and back now has to stop  Cough: normal am cough  x 30 min > mucoid frothing  Sleeping: bed is flat 2 pillows on side no problem wearing 02  SABA use: couple of times a day  02: 2lpm hs and prn daytime not much > very confusing story about working with [employer] and what he needs to qualify for 02 Lung cancer screening :  q February Rec Pantoprazole (protonix) 40 mg (same as 2 nexium)    Take  30-60 min  before first meal of the day and Pepcid (famotidine)  20 mg after supper until return to office - this is the best way to tell whether stomach acid is contributing to your problem.   GERD diet reviewed, bed blocks rec   For cough/ congestion >   mucinex D  up to  every 12 hours as needed  My office will be contacting you by phone for referral for overnight oximetry on room air   Please schedule a follow up office visit in 6 weeks, call sooner if needed with all medications /inhalers/ solutions in hand    05/13/2023  f/u ov/Timothy [name] re:   GOLD 3 COPD / 02 dep hs and prn  maint on breztri  did not  bring meds  Chief Complaint  Patient presents with   Follow-up    SOB with any exertion is persistent.  Dyspnea:  no change  Cough: mucinex d  Sleeping: flat bed 2 pillows s  resp cc  SABA use: few times a day  02: 2lpm hs  - not using 02 as rec  Lung cancer screening :  q feb   Rec No change in medications  Continue 2lpm at bedtime and as needed during the day with target sats > 90% by pulse oximeter  Add: will check with rep re trial of ohtuvayre    07/22/2023  f/u ov/Timothy [name] re: GOLD 3 COPD  maint on breztri and 02 hs   Chief Complaint  Patient presents with   Follow-up  Dyspnea:  very inactive  Cough: one hour in am cough/ congestion and with nasal congestion clear  Sleeping: flat bed/ 2 pillows SABA use: one-twice a day, sometimes  02: 2lpm hs/ not on POC as rec   Lung cancer screening : 06/15/23  RADS 2    No obvious day to day or daytime variability or assoc excess/ purulent sputum or mucus plugs or hemoptysis or cp or chest tightness, subjective wheeze or overt sinus or hb symptoms.    Also denies any obvious fluctuation of symptoms with weather or environmental changes or other aggravating or alleviating factors except as outlined above  No unusual exposure hx or h/o childhood pna/ asthma or knowledge of premature birth.  Current Allergies, Complete Past Medical History, Past  Surgical History, Family History, and Social History were reviewed in [employer] record.  ROS  The following are not active complaints unless bolded Hoarseness, sore throat, dysphagia, dental problems, itching, sneezing,  nasal congestion or discharge of excess mucus or purulent secretions, ear ache,   fever, chills, sweats, unintended wt loss or wt gain, classically pleuritic or exertional cp,  orthopnea pnd or arm/hand swelling  or leg swelling, presyncope, palpitations, abdominal pain, anorexia, nausea, vomiting, diarrhea  or change in bowel habits or change in bladder habits, change in stools or change in urine, dysuria, hematuria,  rash, arthralgias, visual complaints, headache, numbness, weakness or ataxia or problems with walking or coordination,  change in mood or  memory.        Current Meds  Medication Sig   acetaminophen (TYLENOL) 500 MG tablet Take 500-1,000 mg by mouth every 8 (eight) hours as needed for mild pain or headache.    albuterol (VENTOLIN HFA) 108 (90 Base) MCG/ACT inhaler Inhale 2 puffs into the lungs every 6 (six) hours as needed for wheezing or shortness of breath.   amLODipine (NORVASC) 5 MG tablet TAKE 1 TABLET(5 MG) BY MOUTH DAILY   aspirin EC 81 MG tablet Take 1 tablet (81 mg total) by mouth daily.   atorvastatin (LIPITOR) 80 MG tablet TAKE 1 TABLET(80 MG) BY MOUTH AT BEDTIME   bisoprolol (ZEBETA) 5 MG tablet TAKE 1/2 TABLET(2.5 MG) BY MOUTH AT BEDTIME   BREZTRI AEROSPHERE 160-9-4.8 MCG/ACT AERO INHALE 2 PUFFS INTO THE LUNGS IN THE MORNING AND AT BEDTIME   Budeson-Glycopyrrol-Formoterol (BREZTRI AEROSPHERE) 160-9-4.8 MCG/ACT AERO Inhale 2 puffs into the lungs in the morning and at bedtime.   famotidine (PEPCID) 20 MG tablet One daily after supper   finasteride (PROSCAR) 5 MG tablet Take 1 tablet (5 mg total) by mouth daily.   nitroGLYCERIN (NITROSTAT) 0.4 MG SL tablet Place 1 tablet (0.4 mg total) under the tongue every 5 (five) minutes as  needed for chest pain. NEED OV.   OXYGEN Inhale 2 L into the lungs at bedtime.   pantoprazole (PROTONIX) 40 MG tablet Take 1 tablet (40 mg total) by mouth daily. Take 30-60 min before first meal of the day   ticagrelor (BRILINTA) 90 MG TABS tablet TAKE 1 TABLET(90 MG) BY MOUTH TWICE DAILY              Past Medical History:  Diagnosis Date   Altered mental status, improved at discharge 06/19/2013   Bronchitis, chronic (HCC) 06/19/2013   CAD (coronary artery disease)    a. s/p cardiac arrest in 06/2013 with DES to RCA b. 02/2020: Inferior STEMI and cath showing total occlusion of mid-RCA treated with DES placement.    Cancer [facility])    bladder   Cardiac arrest, hypothermic protocol 06/12/2013   COPD exacerbation (HCC) 06/12/2013   GERD (gastroesophageal reflux disease)    Hyperlipidemia LDL goal < 70 06/19/2013   Hypokalemia, replaced 06/19/2013   Hypomagnesemia, replaced 06/19/2013   Hypotension, requiring levophed 06/19/2013   NSTEMI (non-ST elevated myocardial infarction), DES to RCA 06/13/2013   Pre-diabetes    Tobacco abuse 06/19/2013        Objective:   Wts  07/22/2023       130  05/13/2023         131  03/04/2023     131  03/03/2022  126  09/03/2021         125   06/30/2021       125  12/18/2020       126  07/01/2020       132   06/20/20 133 lb (60.3 kg)  05/09/20 131 lb (59.4 kg)  03/07/20 138 lb (62.6 kg)    Vital signs reviewed  07/22/2023  - Note at rest 02 sats  93% on RA   General appearance:    somber amb wm nad     Mod barr***                Assessment

## 2023-07-23 NOTE — Assessment & Plan Note (Signed)
 Reported 07/01/2020  - 12/18/2020   patient walked at a slow pace x 3 laps @ 250 ft each  and oxygen dropped to 87 % during 2lap and oxygen was placed and patient come back up to 93% on 2 liters> ordered .  Best fit eval for amb 02 done 08/06/21  And did ok on 3lpm pulsed at >90% but rec  4 lpm pulse with higher levels of ex > turned down for POC by [employer]  03/04/2023 Patient Saturations on Room Air at Rest = 94%  Patient Saturations on Room Air while Ambulating = 87%  Patient Saturations on 2 Liters of POC oxygen while Ambulating = 93% - ONO on 2lpm 03/16/23  desats x 7 sec only  > no change rx  - 07/22/2023   Walked on RA  x  2  lap(s) =  approx 500  ft  @ nl pace, stopped due to sob with lowest 02 sats 92%    No indicatio for amb 02 at this point - needs to learn slow pacing/ more regular sub max ex and best place for this is rehab but declined   F/u q 3 m sooner prn    Each maintenance medication was reviewed in detail including emphasizing most importantly the difference between maintenance and prns and under what circumstances the prns are to be triggered using an action plan format where appropriate.  Total time for H and P, chart review, counseling, reviewing hfa /nebs/ 02 /pulse ox  device(s) , directly observing portions of ambulatory 02 saturation study/ and generating customized AVS unique to this office visit / same day charting = 32 min

## 2023-07-23 NOTE — Assessment & Plan Note (Signed)
 MM/quit smoking in 2010  - HFA 75% p coaching 09/01/13 > started on symbicort 160 2 bid  - Spirometry 09/15/13  FEV1 1.67 (49%) ratio 44 % - 05/09/2020   breztri 2bid -  05/09/2020   Walked RA  2 laps @ approx 248ft each @ avg pace  stopped due sob / sats 93%  - alpha one AT phenotype  MM,  Level 182  - Allergy profile 05/09/20 >  Eos 0.6/  IgE  162  - PFT's  07/01/2020  FEV1 1.35 (46 % ) ratio 0.48 p 6 % improvement from saba p breztri x 2 prior to study with DLCO  9.40 (40%) corrects to 1.97 (45%)  for alv volume and FV curve classic concavity  - 03/04/2023 pseudowheeze on exam/ assoc nasal congestion >> max gerd rx and mucienx D plus 6 d pred - 03/04/2023  After extensive coaching inhaler device,  effectiveness =    80% > continue breztri  - 07/22/2023 rec ohtuvayre trial and pulmonary rehab (declined the latter)   According to newest gold guidelines, ohtuvayre indicated for refractory doe > will start the paperwork

## 2023-07-29 ENCOUNTER — Telehealth: Payer: Self-pay | Admitting: Pharmacist

## 2023-07-29 NOTE — Telephone Encounter (Signed)
 Received Ohtuvayre new start paperwork. Completed form and faxed with clinicals and insurance card copy to [facility]   Phone#: [phone] Fax#: [phone]

## 2023-07-30 NOTE — Telephone Encounter (Signed)
 Received fax from [place] [facility] that [place] enrollment form has been received. As next step, insurance will be contacted to verify coverage  Patient ID: [phone]

## 2023-07-30 NOTE — Telephone Encounter (Signed)
 Received fax from [employer] with summary of benefits. Referral form for [facility] received. Rx will be triaged to [facility].. Once benefits investigation completed, pharmacy will reach out the patient to schedule shipment. If medication is unaffordable, patient will need to express financial hardship to be referred back to [place] [facility] for patient assistance program pre-screening.   Patient ID: [id number] Pharmacy phone: [phone] [place] [facility] Phone#: [phone]

## 2023-10-06 ENCOUNTER — Other Ambulatory Visit: Payer: Self-pay | Admitting: Internal Medicine

## 2023-10-08 ENCOUNTER — Other Ambulatory Visit: Payer: Self-pay | Admitting: Cardiovascular Disease

## 2023-10-08 DIAGNOSIS — I2510 Atherosclerotic heart disease of native coronary artery without angina pectoris: Secondary | ICD-10-CM

## 2023-10-16 DIAGNOSIS — Z87891 Personal history of nicotine dependence: Secondary | ICD-10-CM | POA: Diagnosis not present

## 2023-10-16 DIAGNOSIS — I25118 Atherosclerotic heart disease of native coronary artery with other forms of angina pectoris: Secondary | ICD-10-CM | POA: Diagnosis not present

## 2023-10-26 ENCOUNTER — Ambulatory Visit: Payer: Medicare Other | Attending: Internal Medicine | Admitting: Internal Medicine

## 2023-10-26 ENCOUNTER — Other Ambulatory Visit: Payer: Self-pay

## 2023-10-26 ENCOUNTER — Encounter: Payer: Self-pay | Admitting: Internal Medicine

## 2023-10-26 VITALS — BP 129/75 | HR 74 | Temp 98.2°F | Ht 66.0 in | Wt 127.0 lb

## 2023-10-26 DIAGNOSIS — J9611 Chronic respiratory failure with hypoxia: Secondary | ICD-10-CM | POA: Diagnosis not present

## 2023-10-26 DIAGNOSIS — Z8551 Personal history of malignant neoplasm of bladder: Secondary | ICD-10-CM

## 2023-10-26 DIAGNOSIS — I10 Essential (primary) hypertension: Secondary | ICD-10-CM

## 2023-10-26 DIAGNOSIS — D72829 Elevated white blood cell count, unspecified: Secondary | ICD-10-CM

## 2023-10-26 DIAGNOSIS — J439 Emphysema, unspecified: Secondary | ICD-10-CM

## 2023-10-26 DIAGNOSIS — D692 Other nonthrombocytopenic purpura: Secondary | ICD-10-CM

## 2023-10-26 DIAGNOSIS — I25118 Atherosclerotic heart disease of native coronary artery with other forms of angina pectoris: Secondary | ICD-10-CM

## 2023-10-26 DIAGNOSIS — Z9981 Dependence on supplemental oxygen: Secondary | ICD-10-CM

## 2023-10-26 MED ORDER — ZOSTER VAC RECOMB ADJUVANTED 50 MCG/0.5ML IM SUSR
0.5000 mL | Freq: Once | INTRAMUSCULAR | 0 refills | Status: AC
  Filled 2023-10-26: qty 0.5, 1d supply, fill #0

## 2023-10-26 MED ORDER — AMLODIPINE BESYLATE 5 MG PO TABS
5.0000 mg | ORAL_TABLET | Freq: Every day | ORAL | 2 refills | Status: AC
Start: 2023-10-26 — End: ?

## 2023-10-26 MED ORDER — ATORVASTATIN CALCIUM 80 MG PO TABS
80.0000 mg | ORAL_TABLET | Freq: Every day | ORAL | 2 refills | Status: AC
Start: 2023-10-26 — End: ?

## 2023-10-26 NOTE — Progress Notes (Signed)
 Patient ID: Timothy [name], male    DOB: 04-Mar-1956  MRN: [id number]  CC: Hypertension (HTN. Med refills. /No questions / concerns/Yes to shingles vax)   Subjective: Timothy [name] is a 68 y.o. male who presents for chronic ds management. His concerns today include:  hx of HTN, cardiac arrest vfib/vtach 06/2013, CAD with stent to RCA, COPD Gold stage III, former smoker, pre-DM, lung nodule on CT 08/2015, Elev PSA (neg prostate bx 07/2022) and bladder CA, chronic leukocytosis (Dr. Federico 07/2022).   Discussed the use of AI scribe software for clinical note transcription with the patient, who gave verbal consent to proceed.  History of Present Illness Timothy [name] is a 68 year old male with hypertension, coronary artery disease, and COPD who presents for follow-up.  HTN/CAD: He experiences occasional chest pain that occurs randomly and is not severe enough to require nitroglycerin . There is no swelling in the legs, headaches, or dizziness. He continues to take amlodipine , bisoprolol , atorvastatin , baby aspirin , and Brilinta  daily.  COPD: he has difficulty adjusting to temperature changes, especially from cold to warm air, and avoids very hot days by staying indoors. He uses a Breztri  inhaler twice daily and albuterol  as needed, ranging from no use to once or twice a day. He uses oxygen at 2 liters at night and occasionally during the day if his oxygen levels drop. He has a pulse oximeter at home but does not check his oxygen levels regularly.  He had seen his pulmonologist Dr. Darlean in March.  He prescribed a nebulizer treatment called Ohtuvayre .  Patient has not been able to afford this medication.  Co-pay is $500.  I noted some bruising on forearms.  He reports easy bruising if he bumps into anything, which he attributes to Brilinta  and aspirin  use. He is leukocytosis which was worked up by Dr. Federico last year.  He had negative BCR/ABL.  Patient was supposed to follow-up in several months but he  did not call back and declined to go back.  History of bladder CA: Denies any hematuria or problems urinating.  He last saw his urologist last year.  At that point he was 5 years out without reoccurrence.  No further surveillance cystoscopy planned unless he develops symptoms.    Patient Active Problem List   Diagnosis Date Noted   Colon cancer screening 11/12/2022   Positive colorectal cancer screening using Cologuard test 11/12/2022   Benign neoplasm of ascending colon 11/12/2022   Influenza A 04/09/2021   Acute on chronic respiratory failure with hypoxia (HCC) 04/09/2021   COPD exacerbation (HCC) 04/03/2021   COPD (chronic obstructive pulmonary disease) (HCC) 04/02/2021   Exercise hypoxemia 07/02/2020   Acute STEMI of inferior wall  02/24/2020   Lung nodule 11/20/2016   Gastroesophageal reflux disease without esophagitis 11/20/2016   Prediabetes 12/29/2013   Dental cavities 12/29/2013   DOE (dyspnea on exertion) 08/30/2013   Essential hypertension 08/03/2013   CAD S/P percutaneous coronary angioplasty 06/19/2013   Hyperlipidemia with target LDL less than 70 06/19/2013   Former cigarette smoker 06/19/2013   NSTEMI (non-ST elevated myocardial infarction), DES to RCA 06/13/2013   COPD GOLD 3 06/12/2013     Current Outpatient Medications on File Prior to Visit  Medication Sig Dispense Refill   acetaminophen  (TYLENOL ) 500 MG tablet Take 500-1,000 mg by mouth every 8 (eight) hours as needed for mild pain or headache.      albuterol  (VENTOLIN  HFA) 108 (90 Base) MCG/ACT inhaler Inhale 2 puffs into  the lungs every 6 (six) hours as needed for wheezing or shortness of breath. 8 g 12   aspirin  EC 81 MG tablet Take 1 tablet (81 mg total) by mouth daily. 90 tablet 2   bisoprolol  (ZEBETA ) 5 MG tablet TAKE 1/2 TABLET(2.5 MG) BY MOUTH AT BEDTIME 15 tablet 0   BREZTRI  AEROSPHERE 160-9-4.8 MCG/ACT AERO INHALE 2 PUFFS INTO THE LUNGS IN THE MORNING AND AT BEDTIME 10.7 g 5    Budeson-Glycopyrrol-Formoterol  (BREZTRI  AEROSPHERE) 160-9-4.8 MCG/ACT AERO Inhale 2 puffs into the lungs in the morning and at bedtime.     budesonide -glycopyrrolate -formoterol  (BREZTRI  AEROSPHERE) 160-9-4.8 MCG/ACT AERO inhaler INHALE 2 PUFFS INTO THE LUNGS IN THE MORNING AND AT BEDTIME 10.7 g 11   famotidine  (PEPCID ) 20 MG tablet One daily after supper     finasteride  (PROSCAR ) 5 MG tablet Take 1 tablet (5 mg total) by mouth daily. 90 tablet 3   nitroGLYCERIN  (NITROSTAT ) 0.4 MG SL tablet Place 1 tablet (0.4 mg total) under the tongue every 5 (five) minutes as needed for chest pain. NEED OV. 25 tablet 2   OXYGEN Inhale 2 L into the lungs at bedtime.     pantoprazole  (PROTONIX ) 40 MG tablet Take 1 tablet (40 mg total) by mouth daily. Take 30-60 min before first meal of the day 90 tablet 3   ticagrelor  (BRILINTA ) 90 MG TABS tablet TAKE 1 TABLET(90 MG) BY MOUTH TWICE DAILY 180 tablet 3   No current facility-administered medications on file prior to visit.    Allergies  Allergen Reactions   Ace Inhibitors Other (See Comments)    Caused wheezing   Aspirin  Swelling and Other (See Comments)    Lips swell- still takes approx 2 times a week in 2021 Low dose ASA is fine, no problems   Ibuprofen Swelling and Other (See Comments)    Lips swell   Losartan  Other (See Comments)    Leg cramps    Social History   Socioeconomic History   Marital status: Divorced    Spouse name: Not on file   Number of children: Not on file   Years of education: Not on file   Highest education level: Not on file  Occupational History   Not on file  Tobacco Use   Smoking status: Former    Current packs/day: 0.00    Average packs/day: 2.0 packs/day for 38.0 years (76.0 ttl pk-yrs)    Types: Cigarettes    Start date: 07/11/1975    Quit date: 07/10/2013    Years since quitting: 10.3   Smokeless tobacco: Never  Vaping Use   Vaping status: Never Used  Substance and Sexual Activity   Alcohol use: No   Drug use: No     Types: Marijuana   Sexual activity: Yes  Other Topics Concern   Not on file  Social History Narrative   Not on file   Social Drivers of Health   Financial Resource Strain: Medium Risk (02/25/2023)   Overall Financial Resource Strain (CARDIA)    Difficulty of Paying Living Expenses: Somewhat hard  Food Insecurity: No Food Insecurity (06/29/2023)   Hunger Vital Sign    Worried About Running Out of Food in the Last Year: Never true    Ran Out of Food in the Last Year: Never true  Transportation Needs: No Transportation Needs (06/29/2023)   PRAPARE - Administrator, Civil Service (Medical): No    Lack of Transportation (Non-Medical): No  Physical Activity: Inactive (06/29/2023)   Exercise Vital  Sign    Days of Exercise per Week: 0 days    Minutes of Exercise per Session: 0 min  Stress: No Stress Concern Present (06/29/2023)   [employer] of Occupational Health - Occupational Stress Questionnaire    Feeling of Stress : Only a little  Social Connections: Moderately Isolated (06/29/2023)   Social Connection and Isolation Panel    Frequency of Communication with Friends and Family: More than three times a week    Frequency of Social Gatherings with Friends and Family: More than three times a week    Attends Religious Services: Never    Database administrator or Organizations: No    Attends Banker Meetings: Never    Marital Status: Living with partner  Intimate Partner Violence: Not At Risk (06/29/2023)   Humiliation, Afraid, Rape, and Kick questionnaire    Fear of Current or Ex-Partner: No    Emotionally Abused: No    Physically Abused: No    Sexually Abused: No    Family History  Problem Relation Age of Onset   Hypertension Other    Colon cancer Neg Hx    Stomach cancer Neg Hx    Esophageal cancer Neg Hx     Past Surgical History:  Procedure Laterality Date   cardiac sstent     COLONOSCOPY WITH PROPOFOL  N/A 11/12/2022   Procedure:  COLONOSCOPY WITH PROPOFOL ;  Surgeon: Abran Norleen SAILOR, MD;  Location: [facility];  Service: Gastroenterology;  Laterality: N/A;   CORONARY ANGIOPLASTY WITH STENT PLACEMENT  06/12/2013   [facility] [place] DES to RCA, NSTEMI   CORONARY STENT INTERVENTION N/A 02/24/2020   Procedure: CORONARY STENT INTERVENTION;  Surgeon: Claudene Victory ORN, MD;  Location: [facility];  Service: Cardiovascular;  Laterality: N/A;   CORONARY/GRAFT ACUTE MI REVASCULARIZATION N/A 02/24/2020   Procedure: Coronary/Graft Acute MI Revascularization;  Surgeon: Claudene Victory ORN, MD;  Location: [facility];  Service: Cardiovascular;  Laterality: N/A;   CYSTOSCOPY W/ RETROGRADES Left 03/29/2017   Procedure: CYSTOSCOPY WITH LEFT RETROGRADE PYELOGRAM;  Surgeon: Ottelin, Mark, MD;  Location: [facility];  Service: Urology;  Laterality: Left;   CYSTOSCOPY WITH DIRECT VISION INTERNAL URETHROTOMY N/A 09/15/2022   Procedure: CYSTOSCOPY WITH BALLOON DILATION;  Surgeon: Shona Layman BROCKS, MD;  Location: [facility];  Service: Urology;  Laterality: N/A;   LEFT HEART CATH AND CORONARY ANGIOGRAPHY N/A 02/24/2020   Procedure: LEFT HEART CATH AND CORONARY ANGIOGRAPHY;  Surgeon: Claudene Victory ORN, MD;  Location: [facility];  Service: Cardiovascular;  Laterality: N/A;   LEFT HEART CATHETERIZATION WITH CORONARY ANGIOGRAM N/A 06/12/2013   Procedure: LEFT HEART CATHETERIZATION WITH CORONARY ANGIOGRAM;  Surgeon: Debby DELENA Sor, MD;  Location: [facility] C[facility];  Service: Cardiovascular;  Laterality: N/A;   POLYPECTOMY  11/12/2022   Procedure: POLYPECTOMY;  Surgeon: Abran Norleen SAILOR, MD;  Location: [facility];  Service: Gastroenterology;;   TRANSURETHRAL RESECTION OF BLADDER TUMOR N/A 03/29/2017   Procedure: TRANSURETHRAL RESECTION OF BLADDER TUMOR (TURBT)/;  Surgeon: Ceil Anes, MD;  Location: [facility];  Service: Urology;  Laterality: N/A;   transurethral ressection of bladder tumor     Dr. Ottelin 03/29/17    ROS: Review of Systems Negative except as  stated above  PHYSICAL EXAM: BP 129/75 (BP Location: Left Arm, Patient Position: Sitting, Cuff Size: Normal)   Pulse 74   Temp 98.2 F (36.8 C) (Oral)   Ht 5' 6 (1.676 m)   Wt 127 lb (57.6 kg)   SpO2 93%  BMI 20.50 kg/m   Wt Readings from Last 3 Encounters:  10/26/23 127 lb (57.6 kg)  07/22/23 130 lb 6.4 oz (59.1 kg)  06/29/23 132 lb (59.9 kg)    Physical Exam   General appearance - alert, well appearing, and in no distress Mental status - normal mood, behavior, speech, dress, motor activity, and thought processes Chest -breath sounds mildly decreased bilaterally, good air entry.  No wheezes or crackles heard.   Heart - normal rate, regular rhythm, normal S1, S2, no murmurs, rubs, clicks or gallops Extremities - peripheral pulses normal, no pedal edema, no clubbing or cyanosis Skin -patient has several spots of resolving ecchymosis on both forearms     Latest Ref Rng & Units 06/28/2023    3:19 PM 09/07/2022    1:33 PM 07/27/2022    2:27 PM  CMP  Glucose 70 - 99 mg/dL 890  891  899   BUN 8 - 27 mg/dL 16  12  13    Creatinine 0.76 - 1.27 mg/dL 8.95  9.16  9.14   Sodium 134 - 144 mmol/L 141  140  138   Potassium 3.5 - 5.2 mmol/L 4.7  4.4  4.4   Chloride 96 - 106 mmol/L 97  104  101   CO2 20 - 29 mmol/L 25  27  28    Calcium  8.6 - 10.2 mg/dL 89.7  9.6  89.5   Total Protein 6.0 - 8.5 g/dL 7.7   8.1   Total Bilirubin 0.0 - 1.2 mg/dL 0.8   0.8   Alkaline Phos 44 - 121 IU/L 122   90   AST 0 - 40 IU/L 18   15   ALT 0 - 44 IU/L 26   18    Lipid Panel     Component Value Date/Time   CHOL 157 06/28/2023 1519   TRIG 162 (H) 06/28/2023 1519   HDL 50 06/28/2023 1519   CHOLHDL 3.1 06/28/2023 1519   CHOLHDL 5.6 02/24/2020 1253   VLDL 42 (H) 02/24/2020 1253   LDLCALC 79 06/28/2023 1519    CBC    Component Value Date/Time   WBC 16.5 (H) 06/28/2023 1519   WBC 15.0 (H) 09/07/2022 1333   RBC 5.53 06/28/2023 1519   RBC 5.50 09/07/2022 1333   HGB 17.1 06/28/2023 1519   HCT  50.8 06/28/2023 1519   PLT 317 06/28/2023 1519   MCV 92 06/28/2023 1519   MCH 30.9 06/28/2023 1519   MCH 30.2 09/07/2022 1333   MCHC 33.7 06/28/2023 1519   MCHC 32.8 09/07/2022 1333   RDW 12.9 06/28/2023 1519   LYMPHSABS 2.3 06/28/2023 1519   MONOABS 1.7 (H) 07/27/2022 1427   EOSABS 0.2 06/28/2023 1519   BASOSABS 0.1 06/28/2023 1519    ASSESSMENT AND PLAN: 1. Essential hypertension (Primary) At goal.  Continue Norvasc  5 mg daily and bisoprolol  5 mg daily. - amLODipine  (NORVASC ) 5 MG tablet; Take 1 tablet (5 mg total) by mouth daily.  Dispense: 90 tablet; Refill: 2  2. Coronary artery disease of native artery of native heart with stable angina pectoris (HCC) Stable.  Continue atorvastatin , aspirin  81 mg daily and Brilinta  90 mg twice a day - atorvastatin  (LIPITOR ) 80 MG tablet; Take 1 tablet (80 mg total) by mouth daily.  Dispense: 90 tablet; Refill: 2  3. Pulmonary emphysema, unspecified emphysema type (HCC) Continue Breztri .  Unable to afford Ohtuvayre  that was prescribed by his pulmonologist.  4. Chronic respiratory failure with hypoxia, on home O2 therapy (HCC) Stable.  Continue nocturnal O2  5. History of bladder cancer Stable without any symptoms to suggest recurrence  6. Senile purpura (HCC) Due to being on aspirin  and Brilinta .  7. Leukocytosis, unspecified type We will continue to monitor CBC intermittently.  At some point we need to get him back to the oncologist.   Patient was given the opportunity to ask questions.  Patient verbalized understanding of the plan and was able to repeat key elements of the plan.   This documentation was completed using Paediatric nurse.  Any transcriptional errors are unintentional.  No orders of the defined types were placed in this encounter.    Requested Prescriptions   Signed Prescriptions Disp Refills   amLODipine  (NORVASC ) 5 MG tablet 90 tablet 2    Sig: Take 1 tablet (5 mg total) by mouth daily.    atorvastatin  (LIPITOR ) 80 MG tablet 90 tablet 2    Sig: Take 1 tablet (80 mg total) by mouth daily.    Return in about 4 months (around 02/25/2024).  Barnie Louder, MD, FACP

## 2023-10-26 NOTE — Patient Instructions (Signed)
 VISIT SUMMARY:  Today, you came in for a follow-up visit to discuss your hypertension, coronary artery disease, and COPD. We also reviewed your medications and general health maintenance.  YOUR PLAN:  -CORONARY ARTERY DISEASE: Coronary artery disease is a condition where the blood vessels supplying your heart are narrowed or blocked. You mentioned experiencing occasional chest pain, but it is not severe enough to require nitroglycerin . Continue taking atorvastatin  80 mg daily, aspirin  81 mg daily, and Brilinta  90 mg daily. Your atorvastatin  and amlodipine  prescriptions will be refilled at [facility] on [address].  -HYPERTENSION: Hypertension, or high blood pressure, is when the force of the blood against your artery walls is too high. Your blood pressure is well-controlled at 129/75 mmHg with your current medications. Continue taking amlodipine  5 mg daily and bisoprolol  5 mg daily.  -CHRONIC OBSTRUCTIVE PULMONARY DISEASE (COPD): COPD is a chronic inflammatory lung disease that obstructs airflow from the lungs. You have difficulty with temperature changes and use a Breztri  inhaler twice daily and albuterol  as needed. Continue using supplemental oxygen at 2 liters at night and as needed during the day. We will discuss the Otuvier nebulizer treatment with your pulmonologist in your upcoming visit.  -LEUKOCYTOSIS: Leukocytosis is an increase in the number of white blood cells in your blood. Your condition has been evaluated previously with no concerning findings. No changes are needed at this time.  -BLADDER CANCER: Bladder cancer is a type of cancer that begins in the cells of the bladder. Currently, you have no issues with blood in your urine or urinary obstruction. No changes are needed at this time.  -GENERAL HEALTH MAINTENANCE: You are due for the second shingles vaccine, which will be administered today.  INSTRUCTIONS:  Please continue taking your medications as prescribed. Your  atorvastatin  and amlodipine  prescriptions will be refilled at [facility] on [address]. Use your pulse oximeter to check your oxygen levels more regularly. We will discuss the Otuvier nebulizer treatment with your pulmonologist in your upcoming visit. Make sure to get your second shingles vaccine today.

## 2023-11-08 NOTE — Progress Notes (Unsigned)
 Timothy [name], male    DOB: 14-Feb-1956   MRN: [id number]   Brief patient profile:   17 yowm MM/quit smoking 2010 with GOLD 3 criteria 2015 maint on symbicort  160 2bid and losing ground with ex tol so referred to pulmonary clinic 05/09/2020 by Dr   Vicci   Retired Journalist, newspaper    History of Present Illness  05/09/2020  Pulmonary/ 1st office eval/Timothy [name] symb maint / insurance would not pay spiriva  Chief Complaint  Patient presents with   Pulmonary Consult     Referred by Dr Barnie Vicci. Pt seen here last in 2015 for COPD. He c/o increased SOB over the past 5 years. He states he gets SOB walking up hill and not too far.  He is using his albuterol  inhaler at 4-6 x per wk on average.    Dyspnea:  Grocery shopping limit is [employer] - can't do [employer] anymore, mailbox is 50 ft and no hill steps at slower pace = MMR[facility] = can't walk 100 yards even at a slow pace at a flat grade s stopping due to sob   Cough: am congestion/ white  Sleep: on side two pillows flat bed  SABA use: saba hfa  rec Plan A = Automatic = Always=    Stop symbicort  and start Breztri  Take 2 puffs first thing in am and then another 2 puffs about 12 hours later.  Work on inhaler technique:  Plan B = Backup (to supplement plan A, not to replace it) Only use your albuterol  inhaler as a rescue medication Plan C = Crisis (instead of Plan B but only if Plan B stops working) - only use your albuterol  nebulizer if you first try Plan B and it fails to help > ok to use the nebulizer up to every 4 hours but if start needing it regularly call for immediate appointment   - 12/18/2020   patient walked at a slow pace x 3 laps @ 250 ft each  and oxygen dropped to 87 % during 2lap and oxygen was placed and patient come back up to 93% on 2 liters> ordered   08/06/21   >>>  Best fit eval for amb 02 done - did ok on 3lpm pulsed at >90% but rec  4 lpm pulse with higher levels of ex    09/03/2021  f/u ov/Timothy [name] re: COPD GOLD 3  maint on breztri   2bid   Chief Complaint  Patient presents with   Follow-up    Breathing is unchanged. He did not bring his o2 today and says he uses this at home sometimes if needed. He is using his albuterol  inhaler about 3 x per wk.   Dyspnea:  does better slow pace, refusing amb 02 to date Mb is 50 ft flat / greenhouse is slt downhill an always out of breath on way back, does not prechallenge with saba as rec  Cough: none  Sleeping: flat one pillow  SABA use: avg  once a day  02:  not really wearing at hs  Covid status:   vax x 3  Rec We will see about you changing companies when your 5 years is up Make sure you check your oxygen saturation  AT  your highest level of activity (NOT after you stop)   to be sure it stays over 90%  Ok to try albuterol  15 min before an activity (on alternating days - like starter fluid)  that you know would usually make you short of breath Please  schedule a follow up visit in 6  months but call sooner if needed     03/03/2022  f/u ov/Timothy [name] re: GOLD 3   maint on breztri    Chief Complaint  Patient presents with   Follow-up    COPD. No sx noted per pt   Dyspnea:  seems to help to use saba before activity  Cough: none  Sleeping:no cc  SABA use: twice daily  02: prn  Lung cancer screening :  referred   LDSCT 06/11/22   RADS  2 with mucus obst peripheral RLL   03/04/2023  f/u ov/Timothy [name] re: GOLD 3 copd/ on 02 prn  maint on breztri    Chief Complaint  Patient presents with   Follow-up    Dyspnea especially during the night x 3 months.  Dyspnea:  no longer going to greenhouse and back now has to stop  Cough: normal am cough  x 30 min > mucoid frothing  Sleeping: bed is flat 2 pillows on side no problem wearing 02  SABA use: couple of times a day  02: 2lpm hs and prn daytime not much > very confusing story about working with [employer] and what he needs to qualify for 02 Lung cancer screening :  q February Rec Pantoprazole  (protonix ) 40 mg (same as 2 nexium)    Take  30-60 min  before first meal of the day and Pepcid  (famotidine )  20 mg after supper until return to office - this is the best way to tell whether stomach acid is contributing to your problem.   GERD diet reviewed, bed blocks rec   For cough/ congestion >   mucinex  D  up to  every 12 hours as needed  My office will be contacting you by phone for referral for overnight oximetry on room air   Please schedule a follow up office visit in 6 weeks, call sooner if needed with all medications /inhalers/ solutions in hand    05/13/2023  f/u ov/Timothy [name] re:   GOLD 3 COPD / 02 dep hs and prn  maint on breztri   did not  bring meds  Chief Complaint  Patient presents with   Follow-up    SOB with any exertion is persistent.  Dyspnea:  no change  Cough: mucinex  d  Sleeping: flat bed 2 pillows s  resp cc  SABA use: few times a day  02: 2lpm hs  - not using 02 as rec  Lung cancer screening :  q feb   Rec No change in medications  Continue 2lpm at bedtime and as needed during the day with target sats > 90% by pulse oximeter    07/22/2023  f/u ov/Timothy [name] re: GOLD 3 COPD  maint on breztri  and 02 hs   Chief Complaint  Patient presents with   Follow-up  Dyspnea:  very inactive  Cough: one hour in am cough/ congestion and with nasal congestion clear  Sleeping: flat bed/ 2 pillows SABA use: one-twice a day, sometimes  02: 2lpm HS only  Lung cancer screening : 06/15/23  RADS 2    11/09/2023  f/u ov/Timothy [name] re: GOLD 3 COPD    maint on ***  No chief complaint on file.   Dyspnea:  *** Cough: *** Sleeping: *** resp cc  SABA use: *** 02: ***  Lung cancer screening :  ***    No obvious day to day or daytime variability or assoc excess/ purulent sputum or mucus plugs or hemoptysis or cp or chest tightness, subjective wheeze  or overt sinus or hb symptoms.    Also denies any obvious fluctuation of symptoms with weather or environmental changes or other aggravating or alleviating factors except as outlined above   No unusual  exposure hx or h/o childhood pna/ asthma or knowledge of premature birth.  Current Allergies, Complete Past Medical History, Past Surgical History, Family History, and Social History were reviewed in [employer] record.  ROS  The following are not active complaints unless bolded Hoarseness, sore throat, dysphagia, dental problems, itching, sneezing,  nasal congestion or discharge of excess mucus or purulent secretions, ear ache,   fever, chills, sweats, unintended wt loss or wt gain, classically pleuritic or exertional cp,  orthopnea pnd or arm/hand swelling  or leg swelling, presyncope, palpitations, abdominal pain, anorexia, nausea, vomiting, diarrhea  or change in bowel habits or change in bladder habits, change in stools or change in urine, dysuria, hematuria,  rash, arthralgias, visual complaints, headache, numbness, weakness or ataxia or problems with walking or coordination,  change in mood or  memory.        No outpatient medications have been marked as taking for the 11/09/23 encounter (Appointment) with Darlean Ozell NOVAK, MD.               Past Medical History:  Diagnosis Date   Altered mental status, improved at discharge 06/19/2013   Bronchitis, chronic (HCC) 06/19/2013   CAD (coronary artery disease)    a. s/p cardiac arrest in 06/2013 with DES to RCA b. 02/2020: Inferior STEMI and cath showing total occlusion of mid-RCA treated with DES placement.    Cancer [facility])    bladder   Cardiac arrest, hypothermic protocol 06/12/2013   COPD exacerbation (HCC) 06/12/2013   GERD (gastroesophageal reflux disease)    Hyperlipidemia LDL goal < 70 06/19/2013   Hypokalemia, replaced 06/19/2013   Hypomagnesemia, replaced 06/19/2013   Hypotension, requiring levophed  06/19/2013   NSTEMI (non-ST elevated myocardial infarction), DES to RCA 06/13/2013   Pre-diabetes    Tobacco abuse 06/19/2013        Objective:   Wts  11/09/2023          ***  07/22/2023       130  05/13/2023          131  03/04/2023     131  03/03/2022     126  09/03/2021         125   06/30/2021       125  12/18/2020       126  07/01/2020       132   06/20/20 133 lb (60.3 kg)  05/09/20 131 lb (59.4 kg)  03/07/20 138 lb (62.6 kg)    Vital signs reviewed  11/09/2023  - Note at rest 02 sats  ***% on ***   General appearance:    ***     Mod barr***          Assessment

## 2023-11-09 ENCOUNTER — Ambulatory Visit: Admitting: Internal Medicine

## 2023-11-09 ENCOUNTER — Encounter: Payer: Self-pay | Admitting: Internal Medicine

## 2023-11-09 VITALS — BP 118/66 | HR 70 | Temp 97.9°F | Ht 66.0 in | Wt 124.2 lb

## 2023-11-09 DIAGNOSIS — J449 Chronic obstructive pulmonary disease, unspecified: Secondary | ICD-10-CM

## 2023-11-09 DIAGNOSIS — R0902 Hypoxemia: Secondary | ICD-10-CM

## 2023-11-09 MED ORDER — BREZTRI AEROSPHERE 160-9-4.8 MCG/ACT IN AERO: INHALATION_SPRAY | RESPIRATORY_TRACT | 11 refills | Status: DC

## 2023-11-09 NOTE — Assessment & Plan Note (Addendum)
 Reported 07/01/2020  - 12/18/2020   patient walked at a slow pace x 3 laps @ 250 ft each  and oxygen dropped to 87 % during 2lap and oxygen was placed and patient come back up to 93% on 2 liters> ordered .  Best fit eval for amb 02 done 08/06/21  And did ok on 3lpm pulsed at >90% but rec  4 lpm pulse with higher levels of ex > turned down for POC by [employer]  03/04/2023 Patient Saturations on Room Air at Rest = 94%  Patient Saturations on Room Air while Ambulating = 87%  Patient Saturations on 2 Liters of POC oxygen while Ambulating = 93% > apparently tuned down for POC  - ONO on 2lpm 03/16/23  desats x 7 sec only  > no change rx  - 07/22/2023   Walked on RA  x  2  lap(s) =  approx 500  ft  @ nl pace, stopped due to sob with lowest 02 sats 92%    Advised: Make sure you check your oxygen saturation at your highest level of activity(NOT after you stop)  to be sure it stays over 90% and keep track of it at least once a week, more often if breathing getting worse, and let me know if losing ground. (Collect the dots to connect the dots approach)    F/u q 6 m sooner prn          Each maintenance medication was reviewed in detail including emphasizing most importantly the difference between maintenance and prns and under what circumstances the prns are to be triggered using an action plan format where appropriate.  Total time for H and P, chart review, counseling, reviewing hfa/neb/ pulse ox  device(s) and generating customized AVS unique to this office visit / same day charting = 31 min for   refractory respiratory  symptoms of copd

## 2023-11-09 NOTE — Patient Instructions (Addendum)
Make sure you check your oxygen saturation at your highest level of activity(NOT after you stop)  to be sure it stays over 90% and keep track of it at least once a week, more often if breathing getting worse, and let me know if losing ground. (Collect the dots to connect the dots approach)     Please schedule a follow up visit in 6 months but call sooner if needed

## 2023-11-09 NOTE — Assessment & Plan Note (Signed)
 MM/quit smoking in 2010  - HFA 75% p coaching 09/01/13 > started on symbicort  160 2 bid  - Spirometry 09/15/13  FEV1 1.67 (49%) ratio 44 % - 05/09/2020   breztri  2bid -  05/09/2020   Walked RA  2 laps @ approx 28ft each @ avg pace  stopped due sob / sats 93%  - alpha one AT phenotype  MM,  Level 182  - Allergy profile 05/09/20 >  Eos 0.6/  IgE  162  - PFT's  07/01/2020  FEV1 1.35 (46 % ) ratio 0.48 p 6 % improvement from saba p breztri  x 2 prior to study with DLCO  9.40 (40%) corrects to 1.97 (45%)  for alv volume and FV curve classic concavity  - 03/04/2023 pseudowheeze on exam/ assoc nasal congestion >> max gerd rx and mucienx D plus 6 d pred - 03/04/2023  After extensive coaching inhaler device,  effectiveness =    80% > continue breztri   - LDSCT  Jun 15 2023  Moderate to marked bullous type emphysema - 07/22/2023 rec ohtuvayre  trial and pulmonary rehab (declined the latter) > 11/09/2023 not able to afford deductible for ohtuvayre  trial      Group D (now reclassified as E) in terms of symptom/risk and laba/lama/ICS  therefore appropriate rx at this point >>>  continue breztri  and prn saba and add on ohtuvayre  trial when he has access to it

## 2023-12-07 ENCOUNTER — Other Ambulatory Visit: Payer: Self-pay | Admitting: Physician Assistant

## 2023-12-09 MED ORDER — TICAGRELOR 90 MG PO TABS: 90.0000 mg | ORAL_TABLET | Freq: Two times a day (BID) | ORAL | 0 refills | Status: DC

## 2023-12-09 NOTE — Addendum Note (Signed)
 Addended by: MATHEW LAYMON SAILOR on: 12/09/2023 12:50 PM   Modules accepted: Orders

## 2023-12-16 ENCOUNTER — Other Ambulatory Visit: Payer: Self-pay

## 2023-12-16 ENCOUNTER — Telehealth: Payer: Self-pay | Admitting: Physician Assistant

## 2023-12-16 DIAGNOSIS — I25118 Atherosclerotic heart disease of native coronary artery with other forms of angina pectoris: Secondary | ICD-10-CM

## 2023-12-16 MED ORDER — TICAGRELOR 90 MG PO TABS: 90.0000 mg | ORAL_TABLET | Freq: Two times a day (BID) | ORAL | 0 refills | Status: DC

## 2023-12-16 MED ORDER — NITROGLYCERIN 0.4 MG SL SUBL
0.4000 mg | SUBLINGUAL_TABLET | SUBLINGUAL | 0 refills | Status: AC | PRN
Start: 2023-12-16 — End: ?

## 2023-12-16 NOTE — Telephone Encounter (Signed)
 RX sent in

## 2023-12-16 NOTE — Telephone Encounter (Signed)
*  STAT* If patient is at the pharmacy, call can be transferred to refill team.   1. Which medications need to be refilled? (please list name of each medication and dose if known)   ticagrelor  (BRILINTA ) 90 MG TABS tablet     nitroGLYCERIN  (NITROSTAT ) 0.4 MG SL tablet   2. Which pharmacy/location (including street and city if local pharmacy) is medication to be sent to? [id number] [place], [place] - [address] AT [facility] Phone: [phone]  Fax: [phone]   3. Do they need a 30 day or 90 day supply? 30  Appt made for 12/29/23

## 2023-12-29 ENCOUNTER — Encounter: Payer: Self-pay | Admitting: Cardiology

## 2023-12-29 ENCOUNTER — Ambulatory Visit: Attending: Cardiovascular Disease | Admitting: Cardiology

## 2023-12-29 VITALS — BP 132/68 | HR 99 | Temp 98.0°F | Resp 16 | Ht 66.0 in | Wt 118.6 lb

## 2023-12-29 DIAGNOSIS — I252 Old myocardial infarction: Secondary | ICD-10-CM | POA: Diagnosis not present

## 2023-12-29 DIAGNOSIS — Z955 Presence of coronary angioplasty implant and graft: Secondary | ICD-10-CM

## 2023-12-29 DIAGNOSIS — E782 Mixed hyperlipidemia: Secondary | ICD-10-CM | POA: Diagnosis not present

## 2023-12-29 DIAGNOSIS — I25118 Atherosclerotic heart disease of native coronary artery with other forms of angina pectoris: Secondary | ICD-10-CM

## 2023-12-29 DIAGNOSIS — J439 Emphysema, unspecified: Secondary | ICD-10-CM

## 2023-12-29 DIAGNOSIS — I2510 Atherosclerotic heart disease of native coronary artery without angina pectoris: Secondary | ICD-10-CM

## 2023-12-29 MED ORDER — TICAGRELOR 60 MG PO TABS
60.0000 mg | ORAL_TABLET | Freq: Two times a day (BID) | ORAL | 3 refills | Status: AC
Start: 2023-12-29 — End: ?

## 2023-12-29 MED ORDER — BISOPROLOL FUMARATE 5 MG PO TABS: 5.0000 mg | ORAL_TABLET | Freq: Every morning | ORAL | 3 refills | Status: AC

## 2023-12-29 MED ORDER — METOPROLOL SUCCINATE ER 25 MG PO TB24: 25.0000 mg | ORAL_TABLET | Freq: Every morning | ORAL | 3 refills | Status: DC

## 2023-12-29 NOTE — Patient Instructions (Addendum)
 Medication Instructions:  REDUCE Brilinta  to 60 mg twice daily  INCREASE Bisoprolol  5 mg daily in morning   *If you need a refill on your cardiac medications before your next appointment, please call your pharmacy*  Lab Work: NONE If you have labs (blood work) drawn today and your tests are completely normal, you will receive your results only by: MyChart Message (if you have MyChart) OR A paper copy in the mail If you have any lab test that is abnormal or we need to change your treatment, we will call you to review the results.  Testing/Procedures: ECHOCARDIOGRAM IN AUGUST 2026 Your physician has requested that you have an echocardiogram. Echocardiography is a painless test that uses sound waves to create images of your heart. It provides your doctor with information about the size and shape of your heart and how well your heart's chambers and valves are working. This procedure takes approximately one hour. There are no restrictions for this procedure. Please do NOT wear cologne, perfume, aftershave, or lotions (deodorant is allowed). Please arrive 15 minutes prior to your appointment time.  Please note: We ask at that you not bring children with you during ultrasound (echo/ vascular) testing. Due to room size and safety concerns, children are not allowed in the ultrasound rooms during exams. Our front office staff cannot provide observation of children in our lobby area while testing is being conducted. An adult accompanying a patient to their appointment will only be allowed in the ultrasound room at the discretion of the ultrasound technician under special circumstances. We apologize for any inconvenience.   Follow-Up: At [facility], you and your health needs are our priority.  As part of our continuing mission to provide you with exceptional heart care, our providers are all part of one team.  This team includes your primary Cardiologist (physician) and Advanced Practice  Providers or APPs (Physician Assistants and Nurse Practitioners) who all work together to provide you with the care you need, when you need it.  Your next appointment:   SEPTEMBER 2026  Provider:   Madonna Large, DO    We recommend signing up for the patient portal called MyChart.  Sign up information is provided on this After Visit Summary.  MyChart is used to connect with patients for Virtual Visits (Telemedicine).  Patients are able to view lab/test results, encounter notes, upcoming appointments, etc.  Non-urgent messages can be sent to your provider as well.   To learn more about what you can do with MyChart, go to [email or web address].

## 2023-12-29 NOTE — Progress Notes (Signed)
 Cardiology Office Note:  .   ID:  Harden, Bramer 1956-03-08, MRN [id number] PCP:  [name], MD  Former Cardiology Providers: Dr. Burnard [facility] Providers Cardiologist:  Madonna Large, DO , [facility] (established care 12/29/23) Electrophysiologist:  None  Click to update primary MD,subspecialty MD or APP then REFRESH:1}    Chief Complaint  Patient presents with   Coronary artery disease involving native coronary artery of   Follow-up    History of Present Illness: Timothy   Timothy [name] is a 68 y.o. Caucasian male whose past medical history and cardiovascular risk factors includes: hx of cardiac arrest in 2015, history of inferior STEMI with PCI to RCA, aortic atherosclerosis, COPD followed by Dr. Darlean, nocturnal oxygen therapy, hyperlipidemia, prediabetes and former tobacco abuse.   Formally under the care of Dr. Burnard who last saw Timothy [name] back in March 2022. I am seeing him for the first time to re-establishing care.   Patient being followed by the practice given his underlying coronary disease and history of STEMI.  Patient had a STEMI in October 2021 and is found to have a total mid RCA occlusion treated with overlapping resolute stents.  He is on antiplatelet therapy as well as lipid-lowering agents.  He has chronic dyspnea on exertion likely secondary to underlying COPD.  He was last seen in the office in April 2024 by Scot Ford PA.   Since last office visit patient denies heart failure symptoms.  He has stable angina on current medical treatment and uses sublingual nitroglycerin  on prn basis. No hospitalizations or urgent care visits for cardiovascular reasons.  He has been compliant with his medical therapy.  No significant weight gain.  Physical endurance remains stable. Requesting refills on multiple medications.   Review of Systems: .   Review of Systems  Cardiovascular:  Positive for chest pain (stable angina - responds to nitro as needed.). Negative for  claudication, irregular heartbeat, leg swelling, near-syncope, orthopnea, palpitations, paroxysmal nocturnal dyspnea and syncope.  Respiratory:  Negative for shortness of breath.   Hematologic/Lymphatic: Negative for bleeding problem.    Studies Reviewed:   EKG: EKG Interpretation Date/Time:  Wednesday December 29 2023 15:25:45 EDT Ventricular Rate:  93 PR Interval:  132 QRS Duration:  76 QT Interval:  352 QTC Calculation: 437 R Axis:   81  Text Interpretation: Normal sinus rhythm Nonspecific ST abnormality When compared with ECG of 02-Apr-2021 01:09, Premature ventricular complexes are no longer Present Confirmed by Large Madonna [phone]) on 12/29/2023 3:41:26 PM  Echocardiogram: 02/2020  1. Left ventricular ejection fraction, by estimation, is 55 to 60%. The  left ventricle has normal function. The left ventricle has no regional  wall motion abnormalities. Left ventricular diastolic parameters were  normal.   2. Right ventricular systolic function is normal. The right ventricular  size is normal. There is normal pulmonary artery systolic pressure.   3. The mitral valve is normal in structure. Trivial mitral valve  regurgitation. No evidence of mitral stenosis.   4. The aortic valve is tricuspid. There is mild calcification of the  aortic valve. Aortic valve regurgitation is not visualized. Mild aortic  valve sclerosis is present, with no evidence of aortic valve stenosis.   5. The inferior vena cava is normal in size with greater than 50%  respiratory variability, suggesting right atrial pressure of 3 mmHg.    Heart catheterization 02/24/2020 Inferior ST elevation MI related to occlusion of overlapping stents in the mid RCA. Total  mid RCA occlusion was reduced to 0% with TIMI grade III flow following dilatation and placement of an overlapping 34 x 2.75 mm Onyx postdilated to 3.0 mm at high pressure throughout the stented segment. Normal left main Normal LAD Normal  circumflex Normal LV function.  LVEDP is less than 15 mmHg.  No significant regional wall motion abnormality is noted.  RADIOLOGY: NA  Risk Assessment/Calculations:   NA   Labs:       Latest Ref Rng & Units 06/28/2023    3:19 PM 02/25/2023    2:52 PM 09/07/2022    1:33 PM  CBC  WBC 3.4 - 10.8 x10E3/uL 16.5  14.8  15.0   Hemoglobin 13.0 - 17.7 g/dL 82.8  82.4  83.3   Hematocrit 37.5 - 51.0 % 50.8  53.3  50.6   Platelets 150 - 450 x10E3/uL 317  350  477        Latest Ref Rng & Units 06/28/2023    3:19 PM 09/07/2022    1:33 PM 07/27/2022    2:27 PM  BMP  Glucose 70 - 99 mg/dL 890  891  899   BUN 8 - 27 mg/dL 16  12  13    Creatinine 0.76 - 1.27 mg/dL 8.95  9.16  9.14   BUN/Creat Ratio 10 - 24 15     Sodium 134 - 144 mmol/L 141  140  138   Potassium 3.5 - 5.2 mmol/L 4.7  4.4  4.4   Chloride 96 - 106 mmol/L 97  104  101   CO2 20 - 29 mmol/L 25  27  28    Calcium  8.6 - 10.2 mg/dL 89.7  9.6  89.5       Latest Ref Rng & Units 06/28/2023    3:19 PM 09/07/2022    1:33 PM 07/27/2022    2:27 PM  CMP  Glucose 70 - 99 mg/dL 890  891  899   BUN 8 - 27 mg/dL 16  12  13    Creatinine 0.76 - 1.27 mg/dL 8.95  9.16  9.14   Sodium 134 - 144 mmol/L 141  140  138   Potassium 3.5 - 5.2 mmol/L 4.7  4.4  4.4   Chloride 96 - 106 mmol/L 97  104  101   CO2 20 - 29 mmol/L 25  27  28    Calcium  8.6 - 10.2 mg/dL 89.7  9.6  89.5   Total Protein 6.0 - 8.5 g/dL 7.7   8.1   Total Bilirubin 0.0 - 1.2 mg/dL 0.8   0.8   Alkaline Phos 44 - 121 IU/L 122   90   AST 0 - 40 IU/L 18   15   ALT 0 - 44 IU/L 26   18     Lab Results  Component Value Date   CHOL 157 06/28/2023   HDL 50 06/28/2023   LDLCALC 79 06/28/2023   TRIG 162 (H) 06/28/2023   CHOLHDL 3.1 06/28/2023   No results for input(s): LIPOA in the last 8760 hours. No components found for: NTPROBNP No results for input(s): PROBNP in the last 8760 hours. No results for input(s): TSH in the last 8760 hours.  Physical Exam:    Today's  Vitals   12/29/23 1523  BP: 132/68  Pulse: 99  Resp: 16  SpO2: 92%  Weight: 118 lb 9.6 oz (53.8 kg)  Height: 5' 6 (1.676 m)   Body mass index is 19.14 kg/m. Wt Readings from Last 3 Encounters:  12/29/23 118 lb 9.6 oz (53.8 kg)  11/09/23 124 lb 3.2 oz (56.3 kg)  10/26/23 127 lb (57.6 kg)    Physical Exam  Constitutional: No distress.  hemodynamically stable  Neck: No JVD present.  Cardiovascular: Normal rate, regular rhythm, S1 normal and S2 normal. Exam reveals no gallop, no S3 and no S4.  No murmur heard. Pulmonary/Chest: Effort normal and breath sounds normal. No stridor. He has no wheezes. He has no rales.  Musculoskeletal:        General: No edema.     Cervical back: Neck supple.  Skin: Skin is warm.     Impression & Recommendation(s):  Impression:   ICD-10-CM   1. Coronary artery disease of native artery of native heart with stable angina pectoris (HCC)  I25.118 EKG 12-Lead    ECHOCARDIOGRAM COMPLETE    bisoprolol  (ZEBETA ) 5 MG tablet    2. History of ST elevation myocardial infarction (STEMI)  I25.2 DISCONTINUED: metoprolol  succinate (TOPROL -XL) 25 MG 24 hr tablet    3. S/P right coronary artery (RCA) stent placement  Z95.5 ticagrelor  (BRILINTA ) 60 MG TABS tablet    4. Mixed hyperlipidemia  E78.2     5. Pulmonary emphysema, unspecified emphysema type (HCC)  J43.9        Recommendation(s):  Coronary artery disease of native artery of native heart with stable angina pectoris (HCC) History of ST elevation myocardial infarction (STEMI) S/P right coronary artery (RCA) stent placement Stable Angina  EKG: non-ischemic  Currently on Brilinta  90 mg po bid, requesting refills.  Will decrease Brilinta  to 60 mg po bid  Anti-anginal: Norvasc , Zebeta , nitroglycerin  SL prn.  No nitroglycerin  over the last 4 weeks.  Recommended changing Zebeta  to Toprol  XL; however, he states that he did not do well w/ Torpol XL in the past.  Will increase Zebeta  from 2.5 mg po qday  to 5 mg po qday  Echo in August 2026 and follow up in September 2026.   Mixed hyperlipidemia Currently on Lipitor  80 mg p.o. daily.   He denies myalgia or other side effects. Most recent lipids dated 06/2023, independently reviewed as noted above.  LDL is 79 mg/dL.  Cardiology is following peripherally.  Orders Placed:  Orders Placed This Encounter  Procedures   EKG 12-Lead   ECHOCARDIOGRAM COMPLETE    Standing Status:   Future    Expected Date:   12/02/2024    Expiration Date:   03/02/2025    [facility] performed[facility]    Does the patient weigh less than or greater than 250 lbs?:   Patient weighs less than 250 lbs    Perflutren DEFINITY (image enhancing agent) should be administered unless hypersensitivity or allergy exist:   Administer Perflutren    Reason for exam-Echo:   CAD Native Vessel  I25.10     Final Medication List:    Meds ordered this encounter  Medications   ticagrelor  (BRILINTA ) 60 MG TABS tablet    Sig: Take 1 tablet (60 mg total) by mouth 2 (two) times daily. Appointment Required For Further Refills [phone]    Dispense:  180 tablet    Refill:  3    Appointment Required For Further Refills [phone]   DISCONTD: metoprolol  succinate (TOPROL -XL) 25 MG 24 hr tablet    Sig: Take 1 tablet (25 mg total) by mouth in the morning. Take with or immediately following a meal.    Dispense:  90 tablet    Refill:  3   bisoprolol  (ZEBETA ) 5 MG tablet    Sig: Take 1 tablet (5 mg total) by mouth in the morning.    Dispense:  90 tablet    Refill:  3    Medications Discontinued During This Encounter  Medication Reason   bisoprolol  (ZEBETA ) 5 MG tablet Discontinued by provider   ticagrelor  (BRILINTA ) 90 MG TABS tablet    metoprolol  succinate (TOPROL -XL) 25 MG 24 hr tablet Patient Preference     Current Outpatient Medications:    acetaminophen  (TYLENOL ) 500 MG tablet, Take 500-1,000 mg by mouth every 8 (eight) hours as needed for mild  pain or headache. , Disp: , Rfl:    albuterol  (VENTOLIN  HFA) 108 (90 Base) MCG/ACT inhaler, Inhale 2 puffs into the lungs every 6 (six) hours as needed for wheezing or shortness of breath., Disp: 8 g, Rfl: 12   amLODipine  (NORVASC ) 5 MG tablet, Take 1 tablet (5 mg total) by mouth daily., Disp: 90 tablet, Rfl: 2   aspirin  EC 81 MG tablet, Take 1 tablet (81 mg total) by mouth daily., Disp: 90 tablet, Rfl: 2   atorvastatin  (LIPITOR ) 80 MG tablet, Take 1 tablet (80 mg total) by mouth daily., Disp: 90 tablet, Rfl: 2   BREZTRI  AEROSPHERE 160-9-4.8 MCG/ACT AERO, INHALE 2 PUFFS INTO THE LUNGS IN THE MORNING AND AT BEDTIME, Disp: 10.7 g, Rfl: 5   Budeson-Glycopyrrol-Formoterol  (BREZTRI  AEROSPHERE) 160-9-4.8 MCG/ACT AERO, Inhale 2 puffs into the lungs in the morning and at bedtime., Disp: , Rfl:    budesonide -glycopyrrolate -formoterol  (BREZTRI  AEROSPHERE) 160-9-4.8 MCG/ACT AERO inhaler, Take 2 puffs first thing in am and then another 2 puffs about 12 hours later., Disp: 10.7 g, Rfl: 11   famotidine  (PEPCID ) 20 MG tablet, One daily after supper, Disp: , Rfl:    finasteride  (PROSCAR ) 5 MG tablet, Take 1 tablet (5 mg total) by mouth daily., Disp: 90 tablet, Rfl: 3   nitroGLYCERIN  (NITROSTAT ) 0.4 MG SL tablet, Place 1 tablet (0.4 mg total) under the tongue every 5 (five) minutes as needed for chest pain., Disp: 25 tablet, Rfl: 0   OXYGEN, Inhale 2 L into the lungs at bedtime., Disp: , Rfl:    pantoprazole  (PROTONIX ) 40 MG tablet, Take 1 tablet (40 mg total) by mouth daily. Take 30-60 min before first meal of the day, Disp: 90 tablet, Rfl: 3   bisoprolol  (ZEBETA ) 5 MG tablet, Take 1 tablet (5 mg total) by mouth in the morning., Disp: 90 tablet, Rfl: 3   ticagrelor  (BRILINTA ) 60 MG TABS tablet, Take 1 tablet (60 mg total) by mouth 2 (two) times daily. Appointment Required For Further Refills [phone], Disp: 180 tablet, Rfl: 3  Consent:   NA  Disposition:   1 year follow up   His questions and concerns were  addressed to his satisfaction. He voices understanding of the recommendations provided during this encounter.    Signed, Madonna Michele HAS, [facility]  A Division of [facility] [address] [place], [place] [address]

## 2024-01-03 ENCOUNTER — Encounter: Payer: Self-pay | Admitting: Cardiology

## 2024-01-21 ENCOUNTER — Other Ambulatory Visit: Payer: Self-pay | Admitting: Physician Assistant

## 2024-02-25 ENCOUNTER — Ambulatory Visit: Attending: Internal Medicine | Admitting: Internal Medicine

## 2024-02-25 ENCOUNTER — Encounter: Payer: Self-pay | Admitting: Internal Medicine

## 2024-02-25 VITALS — BP 127/71 | HR 75 | Temp 97.6°F | Ht 66.0 in | Wt 125.0 lb

## 2024-02-25 DIAGNOSIS — I10 Essential (primary) hypertension: Secondary | ICD-10-CM | POA: Diagnosis not present

## 2024-02-25 DIAGNOSIS — J439 Emphysema, unspecified: Secondary | ICD-10-CM | POA: Diagnosis not present

## 2024-02-25 DIAGNOSIS — Z9981 Dependence on supplemental oxygen: Secondary | ICD-10-CM

## 2024-02-25 DIAGNOSIS — R7303 Prediabetes: Secondary | ICD-10-CM

## 2024-02-25 DIAGNOSIS — Z23 Encounter for immunization: Secondary | ICD-10-CM | POA: Diagnosis not present

## 2024-02-25 DIAGNOSIS — I25118 Atherosclerotic heart disease of native coronary artery with other forms of angina pectoris: Secondary | ICD-10-CM

## 2024-02-25 DIAGNOSIS — J9611 Chronic respiratory failure with hypoxia: Secondary | ICD-10-CM | POA: Diagnosis not present

## 2024-02-25 LAB — POCT GLYCOSYLATED HEMOGLOBIN (HGB A1C): HbA1c, POC (controlled diabetic range): 5.8 % (ref 0.0–7.0)

## 2024-02-25 LAB — GLUCOSE, POCT (MANUAL RESULT ENTRY): POC Glucose: 102 mg/dL — AB (ref 70–99)

## 2024-02-25 NOTE — Progress Notes (Signed)
 Patient ID: Timothy [name], male    DOB: [birth year]  MRN: [id number]  CC: Hypertension (HTN & pre-diabetes f/u./No questions / concerns/Flu vax administered on 02/25/24 - C.A.)   Subjective: Timothy [name] is a 68 y.o. male who presents for chronic ds management. His concerns today include:  hx of HTN, preDM,cardiac arrest vfib/vtach 06/2013, CAD with stent to RCA, COPD Gold stage III, former smoker, pre-DM, lung nodule on CT 08/2015, Elev PSA (neg prostate bx 07/2022) and bladder CA, chronic leukocytosis (Dr. Federico 07/2022).   Discussed the use of AI scribe software for clinical note transcription with the patient, who gave verbal consent to proceed.  History of Present Illness Timothy [name] is a 68 year old male with COPD who presents for a four-month follow-up.  COPD: There have been no changes in his COPD medication regimen since his last visit in July. He continues to use Breztri , two puffs twice a day, and uses supplemental oxygen at two liters primarily at night, occasionally during the day if his oxygen levels drop. No increased cough or shortness of breath recently. He has a nebulizer but only uses it during flare-ups.  CAD/HTN: he continues to take atorvastatin  80 mg daily, aspirin  81 mg daily, Brilinta  60 mg twice a day, amlodipine  5 mg daily, and bisoprolol  5 mg daily. No recent chest pain, use of nitroglycerin , leg swelling, or dizziness.  PreDM Results for orders placed or performed in visit on 02/25/24  POCT glucose (manual entry)   Collection Time: 02/25/24  2:33 PM  Result Value Ref Range   POC Glucose 102 (A) 70 - 99 mg/dl  POCT glycosylated hemoglobin (Hb A1C)   Collection Time: 02/25/24  2:34 PM  Result Value Ref Range   Hemoglobin A1C     HbA1c POC (<> result, manual entry)     HbA1c, POC (prediabetic range)     HbA1c, POC (controlled diabetic range) 5.8 0.0 - 7.0 %  his A1c has improved to 5.8 from 6 earlier this year. He acknowledges consuming a lot of candy  recently but also eats fruits. His weight has decreased from 132 pounds in February to 125 pounds currently.      Patient Active Problem List   Diagnosis Date Noted   Colon cancer screening 11/12/2022   Positive colorectal cancer screening using Cologuard test 11/12/2022   Benign neoplasm of ascending colon 11/12/2022   Influenza A 04/09/2021   Acute on chronic respiratory failure with hypoxia (HCC) 04/09/2021   COPD exacerbation (HCC) 04/03/2021   COPD (chronic obstructive pulmonary disease) (HCC) 04/02/2021   Exercise hypoxemia 07/02/2020   Acute STEMI of inferior wall  02/24/2020   Lung nodule 11/20/2016   Gastroesophageal reflux disease without esophagitis 11/20/2016   Prediabetes 12/29/2013   Dental cavities 12/29/2013   DOE (dyspnea on exertion) 08/30/2013   Essential hypertension 08/03/2013   CAD S/P percutaneous coronary angioplasty 06/19/2013   Hyperlipidemia with target LDL less than 70 06/19/2013   Former cigarette smoker 06/19/2013   NSTEMI (non-ST elevated myocardial infarction), DES to RCA 06/13/2013   COPD GOLD 3 06/12/2013     Current Outpatient Medications on File Prior to Visit  Medication Sig Dispense Refill   acetaminophen  (TYLENOL ) 500 MG tablet Take 500-1,000 mg by mouth every 8 (eight) hours as needed for mild pain or headache.      albuterol  (VENTOLIN  HFA) 108 (90 Base) MCG/ACT inhaler Inhale 2 puffs into the lungs every 6 (six) hours as needed for wheezing or shortness  of breath. 8 g 12   amLODipine  (NORVASC ) 5 MG tablet Take 1 tablet (5 mg total) by mouth daily. 90 tablet 2   aspirin  EC 81 MG tablet Take 1 tablet (81 mg total) by mouth daily. 90 tablet 2   atorvastatin  (LIPITOR ) 80 MG tablet Take 1 tablet (80 mg total) by mouth daily. 90 tablet 2   bisoprolol  (ZEBETA ) 5 MG tablet Take 1 tablet (5 mg total) by mouth in the morning. 90 tablet 3   BREZTRI  AEROSPHERE 160-9-4.8 MCG/ACT AERO INHALE 2 PUFFS INTO THE LUNGS IN THE MORNING AND AT BEDTIME 10.7 g 5    budesonide -glycopyrrolate -formoterol  (BREZTRI  AEROSPHERE) 160-9-4.8 MCG/ACT AERO inhaler Take 2 puffs first thing in am and then another 2 puffs about 12 hours later. 10.7 g 11   famotidine  (PEPCID ) 20 MG tablet One daily after supper     finasteride  (PROSCAR ) 5 MG tablet Take 1 tablet (5 mg total) by mouth daily. 90 tablet 3   nitroGLYCERIN  (NITROSTAT ) 0.4 MG SL tablet Place 1 tablet (0.4 mg total) under the tongue every 5 (five) minutes as needed for chest pain. 25 tablet 0   OXYGEN Inhale 2 L into the lungs at bedtime.     pantoprazole  (PROTONIX ) 40 MG tablet Take 1 tablet (40 mg total) by mouth daily. Take 30-60 min before first meal of the day 90 tablet 3   ticagrelor  (BRILINTA ) 60 MG TABS tablet Take 1 tablet (60 mg total) by mouth 2 (two) times daily. Appointment Required For Further Refills [phone] 180 tablet 3   Budeson-Glycopyrrol-Formoterol  (BREZTRI  AEROSPHERE) 160-9-4.8 MCG/ACT AERO Inhale 2 puffs into the lungs in the morning and at bedtime. (Patient not taking: Reported on 02/25/2024)     No current facility-administered medications on file prior to visit.    Allergies  Allergen Reactions   Ace Inhibitors Other (See Comments)    Caused wheezing   Aspirin  Swelling and Other (See Comments)    Lips swell- still takes approx 2 times a week in 2021 Low dose ASA is fine, no problems   Ibuprofen Swelling and Other (See Comments)    Lips swell   Losartan  Other (See Comments)    Leg cramps    Social History   Socioeconomic History   Marital status: Divorced    Spouse name: Not on file   Number of children: Not on file   Years of education: Not on file   Highest education level: Not on file  Occupational History   Not on file  Tobacco Use   Smoking status: Former    Current packs/day: 0.00    Average packs/day: 2.0 packs/day for 38.0 years (76.0 ttl pk-yrs)    Types: Cigarettes    Start date: 07/11/1975    Quit date: 07/10/2013    Years since quitting: 10.6    Smokeless tobacco: Never  Vaping Use   Vaping status: Never Used  Substance and Sexual Activity   Alcohol use: No   Drug use: No    Types: Marijuana   Sexual activity: Yes  Other Topics Concern   Not on file  Social History Narrative   Not on file   Social Drivers of Health   Financial Resource Strain: Medium Risk (02/25/2023)   Overall Financial Resource Strain (CARDIA)    Difficulty of Paying Living Expenses: Somewhat hard  Food Insecurity: No Food Insecurity (06/29/2023)   Hunger Vital Sign    Worried About Running Out of Food in the Last Year: Never true  Ran Out of Food in the Last Year: Never true  Transportation Needs: No Transportation Needs (06/29/2023)   PRAPARE - Administrator, Civil Service (Medical): No    Lack of Transportation (Non-Medical): No  Physical Activity: Inactive (06/29/2023)   Exercise Vital Sign    Days of Exercise per Week: 0 days    Minutes of Exercise per Session: 0 min  Stress: No Stress Concern Present (06/29/2023)   [employer] of Occupational Health - Occupational Stress Questionnaire    Feeling of Stress : Only a little  Social Connections: Moderately Isolated (06/29/2023)   Social Connection and Isolation Panel    Frequency of Communication with Friends and Family: More than three times a week    Frequency of Social Gatherings with Friends and Family: More than three times a week    Attends Religious Services: Never    Database administrator or Organizations: No    Attends Banker Meetings: Never    Marital Status: Living with partner  Intimate Partner Violence: Not At Risk (06/29/2023)   Humiliation, Afraid, Rape, and Kick questionnaire    Fear of Current or Ex-Partner: No    Emotionally Abused: No    Physically Abused: No    Sexually Abused: No    Family History  Problem Relation Age of Onset   Hypertension Other    Colon cancer Neg Hx    Stomach cancer Neg Hx    Esophageal cancer Neg Hx     Past  Surgical History:  Procedure Laterality Date   cardiac sstent     COLONOSCOPY WITH PROPOFOL  N/A 11/12/2022   Procedure: COLONOSCOPY WITH PROPOFOL ;  Surgeon: Abran Norleen SAILOR, MD;  Location: [facility];  Service: Gastroenterology;  Laterality: N/A;   CORONARY ANGIOPLASTY WITH STENT PLACEMENT  06/12/2013   [facility] [place] DES to RCA, NSTEMI   CORONARY STENT INTERVENTION N/A 02/24/2020   Procedure: CORONARY STENT INTERVENTION;  Surgeon: Claudene Victory ORN, MD;  Location: [facility];  Service: Cardiovascular;  Laterality: N/A;   CORONARY/GRAFT ACUTE MI REVASCULARIZATION N/A 02/24/2020   Procedure: Coronary/Graft Acute MI Revascularization;  Surgeon: Claudene Victory ORN, MD;  Location: [facility];  Service: Cardiovascular;  Laterality: N/A;   CYSTOSCOPY W/ RETROGRADES Left 03/29/2017   Procedure: CYSTOSCOPY WITH LEFT RETROGRADE PYELOGRAM;  Surgeon: Ottelin, Mark, MD;  Location: [facility];  Service: Urology;  Laterality: Left;   CYSTOSCOPY WITH DIRECT VISION INTERNAL URETHROTOMY N/A 09/15/2022   Procedure: CYSTOSCOPY WITH BALLOON DILATION;  Surgeon: Shona Layman BROCKS, MD;  Location: [facility];  Service: Urology;  Laterality: N/A;   LEFT HEART CATH AND CORONARY ANGIOGRAPHY N/A 02/24/2020   Procedure: LEFT HEART CATH AND CORONARY ANGIOGRAPHY;  Surgeon: Claudene Victory ORN, MD;  Location: [facility];  Service: Cardiovascular;  Laterality: N/A;   LEFT HEART CATHETERIZATION WITH CORONARY ANGIOGRAM N/A 06/12/2013   Procedure: LEFT HEART CATHETERIZATION WITH CORONARY ANGIOGRAM;  Surgeon: Debby DELENA Sor, MD;  Location: [facility];  Service: Cardiovascular;  Laterality: N/A;   POLYPECTOMY  11/12/2022   Procedure: POLYPECTOMY;  Surgeon: Abran Norleen SAILOR, MD;  Location: [facility];  Service: Gastroenterology;;   TRANSURETHRAL RESECTION OF BLADDER TUMOR N/A 03/29/2017   Procedure: TRANSURETHRAL RESECTION OF BLADDER TUMOR (TURBT)/;  Surgeon: Ceil Anes, MD;  Location: [facility];  Service: Urology;  Laterality: N/A;    transurethral ressection of bladder tumor     Dr. Ottelin 03/29/17    ROS: Review of Systems Negative except as stated  above  PHYSICAL EXAM: BP 127/71 (BP Location: Left Arm, Patient Position: Sitting, Cuff Size: Normal)   Pulse 75   Temp 97.6 F (36.4 C) (Oral)   Ht 5' 6 (1.676 m)   Wt 125 lb (56.7 kg)   SpO2 90%   BMI 20.18 kg/m   Wt Readings from Last 3 Encounters:  02/25/24 125 lb (56.7 kg)  12/29/23 118 lb 9.6 oz (53.8 kg)  11/09/23 124 lb 3.2 oz (56.3 kg)    Physical Exam General appearance - alert, well appearing, older caucasian male and in no distress Mental status - alert, oriented to person, place, and time Chest - breath sound moderately decreased BL, no wheezes Heart - normal rate, regular rhythm, normal S1, S2, no murmurs, rubs, clicks or gallops Extremities - peripheral pulses normal, no pedal edema, no clubbing or cyanosis     Latest Ref Rng & Units 06/28/2023    3:19 PM 09/07/2022    1:33 PM 07/27/2022    2:27 PM  CMP  Glucose 70 - 99 mg/dL 890  891  899   BUN 8 - 27 mg/dL 16  12  13    Creatinine 0.76 - 1.27 mg/dL 8.95  9.16  9.14   Sodium 134 - 144 mmol/L 141  140  138   Potassium 3.5 - 5.2 mmol/L 4.7  4.4  4.4   Chloride 96 - 106 mmol/L 97  104  101   CO2 20 - 29 mmol/L 25  27  28    Calcium  8.6 - 10.2 mg/dL 89.7  9.6  89.5   Total Protein 6.0 - 8.5 g/dL 7.7   8.1   Total Bilirubin 0.0 - 1.2 mg/dL 0.8   0.8   Alkaline Phos 44 - 121 IU/L 122   90   AST 0 - 40 IU/L 18   15   ALT 0 - 44 IU/L 26   18    Lipid Panel     Component Value Date/Time   CHOL 157 06/28/2023 1519   TRIG 162 (H) 06/28/2023 1519   HDL 50 06/28/2023 1519   CHOLHDL 3.1 06/28/2023 1519   CHOLHDL 5.6 02/24/2020 1253   VLDL 42 (H) 02/24/2020 1253   LDLCALC 79 06/28/2023 1519    CBC    Component Value Date/Time   WBC 16.5 (H) 06/28/2023 1519   WBC 15.0 (H) 09/07/2022 1333   RBC 5.53 06/28/2023 1519   RBC 5.50 09/07/2022 1333   HGB 17.1 06/28/2023 1519   HCT 50.8  06/28/2023 1519   PLT 317 06/28/2023 1519   MCV 92 06/28/2023 1519   MCH 30.9 06/28/2023 1519   MCH 30.2 09/07/2022 1333   MCHC 33.7 06/28/2023 1519   MCHC 32.8 09/07/2022 1333   RDW 12.9 06/28/2023 1519   LYMPHSABS 2.3 06/28/2023 1519   MONOABS 1.7 (H) 07/27/2022 1427   EOSABS 0.2 06/28/2023 1519   BASOSABS 0.1 06/28/2023 1519    ASSESSMENT AND PLAN: 1. Essential hypertension (Primary) At goal. Continue bisoprolol  5 mg, amlodipine  5 mg daily.  2. Chronic obstructive pulmonary disease with emphysema, unspecified emphysema type (HCC) Stable.  Continue Breztri  inhaler and albuterol  as needed.  3. Chronic respiratory failure with hypoxia, on home O2 therapy (HCC) Uses O2 mainly at nights.  4. Coronary artery disease of native artery of native heart with stable angina pectoris Stable.  Continue atorvastatin  80 mg daily, bisoprolol  5 mg daily, Brilinta  60 mg twice a day and aspirin   5. Prediabetes Discussed and encouraged healthy eating habits.  Advised on eating healthier snacks instead of candies. - POCT glycosylated hemoglobin (Hb A1C) - POCT glucose (manual entry)  6. Need for immunization against influenza Given today.   Patient was given the opportunity to ask questions.  Patient verbalized understanding of the plan and was able to repeat key elements of the plan.   This documentation was completed using Paediatric nurse.  Any transcriptional errors are unintentional.  Orders Placed This Encounter  Procedures   Flu vaccine HIGH DOSE PF(Fluzone Trivalent)   POCT glycosylated hemoglobin (Hb A1C)   POCT glucose (manual entry)     Requested Prescriptions    No prescriptions requested or ordered in this encounter    Return in about 4 months (around 06/27/2024).  Barnie Louder, MD, FACP

## 2024-02-25 NOTE — Patient Instructions (Signed)
  VISIT SUMMARY: You had a follow-up appointment to review your COPD, heart disease, hypertension, and prediabetes. Your COPD is well-managed with no recent exacerbations, and you are working on obtaining a nebulizer treatment through a patient assistance program. Your heart disease and hypertension are also well-controlled, and your blood pressure is stable. Your A1c has improved, indicating better control of your prediabetes, although you have been consuming a lot of candy recently. You have also experienced a slight weight loss since February.  YOUR PLAN: -CHRONIC OBSTRUCTIVE PULMONARY DISEASE (COPD): COPD is a chronic lung condition that makes it hard to breathe. Your COPD is well-managed with no recent flare-ups. Continue using Breztri , two puffs twice a day, and supplemental oxygen at two liters at night. You are working on getting Octuvair nebulizer treatment through a patient assistance program. Use your nebulizer during flare-ups if your breathing worsens.  -CORONARY ARTERY DISEASE AND HYPERTENSION: Coronary artery disease is a condition where the heart's blood vessels are narrowed or blocked, and hypertension is high blood pressure. Both conditions are well-managed with your current medications. Continue taking atorvastatin  80 mg daily, aspirin  81 mg daily, Brilinta  60 mg twice a day, amlodipine  5 mg daily, and bisoprolol  5 mg daily.  -PREDIABETES: Prediabetes is a condition where blood sugar levels are higher than normal but not high enough to be classified as diabetes. Your A1c has improved from 6.0 to 5.8, indicating better control. To prevent progression to diabetes, reduce your sugar intake. Your weight is stable at 125 lbs.  INSTRUCTIONS: Continue with your current medications and treatments as discussed. Work on reducing your sugar intake to manage your prediabetes better. Follow up with the patient assistance program to obtain the Octuvair nebulizer treatment. Use your nebulizer during  flare-ups if your breathing worsens. Schedule your next follow-up appointment in four months.                      Contains text generated by [employer]bridge.                                 Contains text generated by Abridge.

## 2024-04-18 ENCOUNTER — Other Ambulatory Visit: Payer: Self-pay

## 2024-04-18 ENCOUNTER — Emergency Department (HOSPITAL_COMMUNITY)

## 2024-04-18 ENCOUNTER — Inpatient Hospital Stay (HOSPITAL_COMMUNITY)
Admission: EM | Admit: 2024-04-18 | Discharge: 2024-04-26 | Disposition: A | Discharge status: Home Health | Attending: Internal Medicine | Admitting: Internal Medicine

## 2024-04-18 DIAGNOSIS — R0603 Acute respiratory distress: Secondary | ICD-10-CM | POA: Diagnosis present

## 2024-04-18 DIAGNOSIS — F419 Anxiety disorder, unspecified: Secondary | ICD-10-CM | POA: Diagnosis not present

## 2024-04-18 DIAGNOSIS — Z886 Allergy status to analgesic agent status: Secondary | ICD-10-CM

## 2024-04-18 DIAGNOSIS — E782 Mixed hyperlipidemia: Secondary | ICD-10-CM | POA: Diagnosis not present

## 2024-04-18 DIAGNOSIS — Z955 Presence of coronary angioplasty implant and graft: Secondary | ICD-10-CM

## 2024-04-18 DIAGNOSIS — Z7902 Long term (current) use of antithrombotics/antiplatelets: Secondary | ICD-10-CM | POA: Diagnosis not present

## 2024-04-18 DIAGNOSIS — I10 Essential (primary) hypertension: Secondary | ICD-10-CM | POA: Diagnosis present

## 2024-04-18 DIAGNOSIS — Z8551 Personal history of malignant neoplasm of bladder: Secondary | ICD-10-CM

## 2024-04-18 DIAGNOSIS — Z7982 Long term (current) use of aspirin: Secondary | ICD-10-CM

## 2024-04-18 DIAGNOSIS — Z8249 Family history of ischemic heart disease and other diseases of the circulatory system: Secondary | ICD-10-CM

## 2024-04-18 DIAGNOSIS — Z79899 Other long term (current) drug therapy: Secondary | ICD-10-CM

## 2024-04-18 DIAGNOSIS — N3000 Acute cystitis without hematuria: Secondary | ICD-10-CM | POA: Diagnosis present

## 2024-04-18 DIAGNOSIS — Z8674 Personal history of sudden cardiac arrest: Secondary | ICD-10-CM

## 2024-04-18 DIAGNOSIS — E440 Moderate protein-calorie malnutrition: Secondary | ICD-10-CM | POA: Diagnosis present

## 2024-04-18 DIAGNOSIS — R911 Solitary pulmonary nodule: Secondary | ICD-10-CM | POA: Diagnosis present

## 2024-04-18 DIAGNOSIS — Z888 Allergy status to other drugs, medicaments and biological substances status: Secondary | ICD-10-CM | POA: Diagnosis not present

## 2024-04-18 DIAGNOSIS — M1990 Unspecified osteoarthritis, unspecified site: Secondary | ICD-10-CM | POA: Diagnosis present

## 2024-04-18 DIAGNOSIS — J441 Chronic obstructive pulmonary disease with (acute) exacerbation: Secondary | ICD-10-CM | POA: Diagnosis present

## 2024-04-18 DIAGNOSIS — J9620 Acute and chronic respiratory failure, unspecified whether with hypoxia or hypercapnia: Secondary | ICD-10-CM | POA: Diagnosis not present

## 2024-04-18 DIAGNOSIS — R197 Diarrhea, unspecified: Secondary | ICD-10-CM | POA: Diagnosis present

## 2024-04-18 DIAGNOSIS — Z87891 Personal history of nicotine dependence: Secondary | ICD-10-CM

## 2024-04-18 DIAGNOSIS — R918 Other nonspecific abnormal finding of lung field: Secondary | ICD-10-CM | POA: Diagnosis present

## 2024-04-18 DIAGNOSIS — K219 Gastro-esophageal reflux disease without esophagitis: Secondary | ICD-10-CM | POA: Diagnosis present

## 2024-04-18 DIAGNOSIS — J9622 Acute and chronic respiratory failure with hypercapnia: Secondary | ICD-10-CM | POA: Diagnosis present

## 2024-04-18 DIAGNOSIS — R0609 Other forms of dyspnea: Secondary | ICD-10-CM | POA: Diagnosis not present

## 2024-04-18 DIAGNOSIS — Z7951 Long term (current) use of inhaled steroids: Secondary | ICD-10-CM

## 2024-04-18 DIAGNOSIS — Z6820 Body mass index (BMI) 20.0-20.9, adult: Secondary | ICD-10-CM

## 2024-04-18 DIAGNOSIS — J439 Emphysema, unspecified: Secondary | ICD-10-CM | POA: Diagnosis present

## 2024-04-18 DIAGNOSIS — Z9981 Dependence on supplemental oxygen: Secondary | ICD-10-CM | POA: Diagnosis not present

## 2024-04-18 DIAGNOSIS — I252 Old myocardial infarction: Secondary | ICD-10-CM | POA: Diagnosis not present

## 2024-04-18 DIAGNOSIS — Z515 Encounter for palliative care: Secondary | ICD-10-CM | POA: Diagnosis not present

## 2024-04-18 DIAGNOSIS — J9612 Chronic respiratory failure with hypercapnia: Secondary | ICD-10-CM | POA: Diagnosis not present

## 2024-04-18 DIAGNOSIS — J9621 Acute and chronic respiratory failure with hypoxia: Secondary | ICD-10-CM | POA: Diagnosis present

## 2024-04-18 DIAGNOSIS — Z711 Person with feared health complaint in whom no diagnosis is made: Secondary | ICD-10-CM | POA: Diagnosis not present

## 2024-04-18 DIAGNOSIS — J449 Chronic obstructive pulmonary disease, unspecified: Secondary | ICD-10-CM

## 2024-04-18 DIAGNOSIS — I25118 Atherosclerotic heart disease of native coronary artery with other forms of angina pectoris: Secondary | ICD-10-CM | POA: Diagnosis not present

## 2024-04-18 DIAGNOSIS — Z7189 Other specified counseling: Secondary | ICD-10-CM | POA: Diagnosis not present

## 2024-04-18 DIAGNOSIS — I2510 Atherosclerotic heart disease of native coronary artery without angina pectoris: Secondary | ICD-10-CM | POA: Diagnosis present

## 2024-04-18 DIAGNOSIS — R7303 Prediabetes: Secondary | ICD-10-CM | POA: Diagnosis present

## 2024-04-18 LAB — COMPREHENSIVE METABOLIC PANEL WITH GFR
ALT: 28 U/L (ref 0–44)
AST: 27 U/L (ref 15–41)
Albumin: 4.5 g/dL (ref 3.5–5.0)
Alkaline Phosphatase: 115 U/L (ref 38–126)
Anion gap: 15 (ref 5–15)
BUN: 11 mg/dL (ref 8–23)
CO2: 28 mmol/L (ref 22–32)
Calcium: 9.8 mg/dL (ref 8.9–10.3)
Chloride: 98 mmol/L (ref 98–111)
Creatinine, Ser: 0.8 mg/dL (ref 0.61–1.24)
GFR, Estimated: >60 mL/min (ref >60)
Glucose, Bld: 190 mg/dL — ABNORMAL HIGH (ref 70–99)
Potassium: 3.9 mmol/L (ref 3.5–5.1)
Sodium: 141 mmol/L (ref 135–145)
Total Bilirubin: 0.5 mg/dL (ref 0.0–1.2)
Total Protein: 8.2 g/dL — ABNORMAL HIGH (ref 6.5–8.1)

## 2024-04-18 LAB — I-STAT CHEM 8, ED
BUN: 12 mg/dL (ref 8–23)
Calcium, Ion: 1.11 mmol/L — ABNORMAL LOW (ref 1.15–1.40)
Chloride: 98 mmol/L (ref 98–111)
Creatinine, Ser: 0.9 mg/dL (ref 0.61–1.24)
Glucose, Bld: 194 mg/dL — ABNORMAL HIGH (ref 70–99)
HCT: 55 % — ABNORMAL HIGH (ref 39.0–52.0)
Hemoglobin: 18.7 g/dL — ABNORMAL HIGH (ref 13.0–17.0)
Potassium: 3.9 mmol/L (ref 3.5–5.1)
Sodium: 140 mmol/L (ref 135–145)
TCO2: 32 mmol/L (ref 22–32)

## 2024-04-18 LAB — CBC WITH DIFFERENTIAL/PLATELET
Abs Immature Granulocytes: 0.15 K/uL — ABNORMAL HIGH (ref 0.00–0.07)
Basophils Absolute: 0.1 K/uL (ref 0.0–0.1)
Basophils Relative: 0 %
Eosinophils Absolute: 0.1 K/uL (ref 0.0–0.5)
Eosinophils Relative: 0 %
HCT: 52.9 % — ABNORMAL HIGH (ref 39.0–52.0)
Hemoglobin: 17.7 g/dL — ABNORMAL HIGH (ref 13.0–17.0)
Immature Granulocytes: 1 %
Lymphocytes Relative: 11 %
Lymphs Abs: 2.4 K/uL (ref 0.7–4.0)
MCH: 30.5 pg (ref 26.0–34.0)
MCHC: 33.5 g/dL (ref 30.0–36.0)
MCV: 91 fL (ref 80.0–100.0)
Monocytes Absolute: 2.4 K/uL — ABNORMAL HIGH (ref 0.1–1.0)
Monocytes Relative: 11 %
Neutro Abs: 17.3 K/uL — ABNORMAL HIGH (ref 1.7–7.7)
Neutrophils Relative %: 77 %
Platelets: 406 K/uL — ABNORMAL HIGH (ref 150–400)
RBC: 5.81 MIL/uL (ref 4.22–5.81)
RDW: 12.4 % (ref 11.5–15.5)
WBC: 22.3 K/uL — ABNORMAL HIGH (ref 4.0–10.5)
nRBC: 0 % (ref 0.0–0.2)

## 2024-04-18 LAB — PROTIME-INR
INR: 1 (ref 0.8–1.2)
Prothrombin Time: 13.7 s (ref 11.4–15.2)

## 2024-04-18 LAB — RESP PANEL BY RT-PCR (RSV, FLU A&B, COVID)  RVPGX2
Influenza A by PCR: NEGATIVE
Influenza B by PCR: NEGATIVE
Resp Syncytial Virus by PCR: NEGATIVE
SARS Coronavirus 2 by RT PCR: NEGATIVE

## 2024-04-18 LAB — I-STAT CG4 LACTIC ACID, ED
Lactic Acid, Venous: 2.3 mmol/L (ref 0.5–1.9)
Lactic Acid, Venous: 1.7 mmol/L (ref 0.5–1.9)

## 2024-04-18 LAB — TROPONIN T, HIGH SENSITIVITY
Troponin T High Sensitivity: <15 ng/L (ref 0–19)
Troponin T High Sensitivity: <15 ng/L (ref 0–19)

## 2024-04-18 MED ORDER — ENOXAPARIN SODIUM 40 MG/0.4ML IJ SOSY
40.0000 mg | PREFILLED_SYRINGE | INTRAMUSCULAR | Status: DC
  Administered 2024-04-19 – 2024-04-26 (×6): 40 mg via SUBCUTANEOUS
  Filled 2024-04-18 (×7): qty 0.4

## 2024-04-18 MED ORDER — SENNOSIDES-DOCUSATE SODIUM 8.6-50 MG PO TABS: 1.0000 | ORAL_TABLET | Freq: Every evening | ORAL | Status: DC | PRN

## 2024-04-18 MED ORDER — SODIUM CHLORIDE 0.9 % IV SOLN: INTRAVENOUS | Status: AC

## 2024-04-18 MED ORDER — AZITHROMYCIN 500 MG PO TABS
500.0000 mg | ORAL_TABLET | Freq: Every day | ORAL | Status: AC
  Administered 2024-04-19 – 2024-04-21 (×3): 500 mg via ORAL
  Filled 2024-04-18: qty 1
  Filled 2024-04-18: qty 2
  Filled 2024-04-18: qty 1

## 2024-04-18 MED ORDER — SODIUM CHLORIDE 0.9 % IV SOLN
500.0000 mg | Freq: Once | INTRAVENOUS | Status: AC
  Administered 2024-04-18: 20:00:00 500 mg via INTRAVENOUS
  Filled 2024-04-18: qty 5

## 2024-04-18 MED ORDER — METHYLPREDNISOLONE SODIUM SUCC 125 MG IJ SOLR: 80.0000 mg | Freq: Every day | INTRAMUSCULAR | Status: DC

## 2024-04-18 MED ORDER — ACETAMINOPHEN 650 MG RE SUPP: 650.0000 mg | Freq: Four times a day (QID) | RECTAL | Status: DC | PRN

## 2024-04-18 MED ORDER — ONDANSETRON HCL 4 MG/2ML IJ SOLN
4.0000 mg | Freq: Four times a day (QID) | INTRAMUSCULAR | Status: DC | PRN
  Administered 2024-04-19: 16:00:00 4 mg via INTRAVENOUS
  Filled 2024-04-18: qty 2

## 2024-04-18 MED ORDER — IPRATROPIUM BROMIDE 0.02 % IN SOLN
0.5000 mg | Freq: Once | RESPIRATORY_TRACT | Status: AC
  Administered 2024-04-18: 20:00:00 0.5 mg via RESPIRATORY_TRACT
  Filled 2024-04-18: qty 2.5

## 2024-04-18 MED ORDER — ALBUTEROL SULFATE (2.5 MG/3ML) 0.083% IN NEBU
10.0000 mg/h | INHALATION_SOLUTION | Freq: Once | RESPIRATORY_TRACT | Status: AC
  Administered 2024-04-18: 20:00:00 10 mg/h via RESPIRATORY_TRACT
  Filled 2024-04-18: qty 3

## 2024-04-18 MED ORDER — ONDANSETRON HCL 4 MG PO TABS
4.0000 mg | ORAL_TABLET | Freq: Four times a day (QID) | ORAL | Status: DC | PRN
  Administered 2024-04-19: 10:00:00 4 mg via ORAL
  Filled 2024-04-18: qty 1

## 2024-04-18 MED ORDER — ACETAMINOPHEN 325 MG PO TABS: 650.0000 mg | ORAL_TABLET | Freq: Four times a day (QID) | ORAL | Status: DC | PRN

## 2024-04-18 MED ORDER — LACTATED RINGERS IV BOLUS
1000.0000 mL | Freq: Once | INTRAVENOUS | Status: AC
  Administered 2024-04-18: 19:00:00 1000 mL via INTRAVENOUS

## 2024-04-18 MED ORDER — METHYLPREDNISOLONE SODIUM SUCC 125 MG IJ SOLR
80.0000 mg | Freq: Every day | INTRAMUSCULAR | Status: DC
  Administered 2024-04-19: 10:00:00 80 mg via INTRAVENOUS
  Filled 2024-04-18: qty 2

## 2024-04-18 MED ORDER — SODIUM CHLORIDE 0.9 % IV BOLUS
1000.0000 mL | Freq: Once | INTRAVENOUS | Status: AC
  Administered 2024-04-18: 1000 mL via INTRAVENOUS

## 2024-04-18 MED ORDER — BISACODYL 5 MG PO TBEC: 5.0000 mg | DELAYED_RELEASE_TABLET | Freq: Every day | ORAL | Status: DC | PRN

## 2024-04-18 MED ORDER — SODIUM CHLORIDE 0.9 % IV SOLN
2.0000 g | Freq: Once | INTRAVENOUS | Status: AC
  Administered 2024-04-18: 20:00:00 2 g via INTRAVENOUS
  Filled 2024-04-18: qty 20

## 2024-04-18 NOTE — H&P (Signed)
 Who History and Physical  Timothy [name] FMW:[id number] DOB: 12/31/1955 DOA: 04/18/2024  PCP: [name], MD   Chief Complaint: Shortness of breath  HPI: Timothy [name] is a 68 y.o. male with medical history significant for HTN, cardiac arrest vfib/vtach 06/2013, NSTEMI/CAD with stent to RCA, COPD Gold stage III, chronic hypoxic respiratory failure on 2 L at night and as needed, former smoker, prediabetes, pulmonary nodule, GERD, HLD, Elev PSA (neg prostate bx 07/2022) and bladder cancer who presented via EMS for evaluation of shortness of breath, nausea vomiting and diarrhea.  Patient reports that over the last 3 to 4 days, he has had persistent watery diarrhea with associated nausea and vomiting.  He has also had increased shortness of breath for the last few days that worsened today causing him to increase his oxygen to 3.5 L.  He endorsed associated productive cough and wheezing but denies any fevers, chills, chest pain, dizziness, headache, bloody stools or dysuria. He denies any recent new restaurants/food, exposure to lakes or recent antibiotic/hospitalization.  En route, EMS reports giving patient 10 mg of albuterol , 1 mg of Atrovent , 125 mg IV Solu-Medrol  and 2 g IV mag  ED Course: Initial vitals show temp 98, RR 16-22, HR 100-120s, SBP 120-160s, SpO2 initially 97% on 7 L [place], transitioned to BiPAP. Initial labs significant for WBC 22, Hgb 17.7, platelet 4 6, lactic acid 2.3, normal renal function, negative flu, RSV and COVID test. CXR shows no acute cardiopulmonary disease. Pt received IV Rocephin , IV azithromycin  IV LR 1 L bolus and DuoNebs. TR[facility] was consulted for admission.   Review of Systems: Please see HPI for pertinent positives and negatives. A complete 10 system review of systems are otherwise negative.  Past Medical History:  Diagnosis Date   Altered mental status, improved at discharge 06/19/2013   Arthritis    Bladder cancer (HCC) 2018   Bronchitis, chronic (HCC)  06/19/2013   CAD (coronary artery disease)    a. s/p cardiac arrest in 06/2013 with DES to RCA b. 02/2020: Inferior STEMI and cath showing total occlusion of mid-RCA treated with DES placement.    Cancer [facility])    bladder   Cardiac arrest, hypothermic protocol 06/12/2013   COPD exacerbation (HCC) 06/12/2013   GERD (gastroesophageal reflux disease)    Hx of leukocytosis    Hyperlipidemia LDL goal < 70 06/19/2013   Hypokalemia, replaced 06/19/2013   Hypomagnesemia, replaced 06/19/2013   Hypotension, requiring levophed  06/19/2013   NSTEMI (non-ST elevated myocardial infarction), DES to RCA 06/13/2013   Pre-diabetes    Tobacco abuse 06/19/2013   Past Surgical History:  Procedure Laterality Date   cardiac sstent     COLONOSCOPY WITH PROPOFOL  N/A 11/12/2022   Procedure: COLONOSCOPY WITH PROPOFOL ;  Surgeon: Abran Norleen SAILOR, MD;  Location: [facility];  Service: Gastroenterology;  Laterality: N/A;   CORONARY ANGIOPLASTY WITH STENT PLACEMENT  06/12/2013   Xience [place] DES to RCA, NSTEMI   CORONARY STENT INTERVENTION N/A 02/24/2020   Procedure: CORONARY STENT INTERVENTION;  Surgeon: Claudene Victory ORN, MD;  Location: [facility];  Service: Cardiovascular;  Laterality: N/A;   CORONARY/GRAFT ACUTE MI REVASCULARIZATION N/A 02/24/2020   Procedure: Coronary/Graft Acute MI Revascularization;  Surgeon: Claudene Victory ORN, MD;  Location: [facility];  Service: Cardiovascular;  Laterality: N/A;   CYSTOSCOPY W/ RETROGRADES Left 03/29/2017   Procedure: CYSTOSCOPY WITH LEFT RETROGRADE PYELOGRAM;  Surgeon: Ottelin, Mark, MD;  Location: [facility];  Service: Urology;  Laterality: Left;   CYSTOSCOPY  WITH DIRECT VISION INTERNAL URETHROTOMY N/A 09/15/2022   Procedure: CYSTOSCOPY WITH BALLOON DILATION;  Surgeon: Shona Layman BROCKS, MD;  Location: [facility];  Service: Urology;  Laterality: N/A;   LEFT HEART CATH AND CORONARY ANGIOGRAPHY N/A 02/24/2020   Procedure: LEFT HEART CATH AND CORONARY ANGIOGRAPHY;  Surgeon:  Claudene Victory ORN, MD;  [facility]: [facility];  Service: Cardiovascular;  Laterality: N/A;   LEFT HEART CATHETERIZATION WITH CORONARY ANGIOGRAM N/A 06/12/2013   Procedure: LEFT HEART CATHETERIZATION WITH CORONARY ANGIOGRAM;  Surgeon: Debby DELENA Sor, MD;  Location: [facility] CATH LAB;  Service: Cardiovascular;  Laterality: N/A;   POLYPECTOMY  11/12/2022   Procedure: POLYPECTOMY;  Surgeon: Abran Norleen SAILOR, MD;  Location: [facility];  Service: Gastroenterology;;   TRANSURETHRAL RESECTION OF BLADDER TUMOR N/A 03/29/2017   Procedure: TRANSURETHRAL RESECTION OF BLADDER TUMOR (TURBT)/;  Surgeon: Ceil Anes, MD;  Location: [facility];  Service: Urology;  Laterality: N/A;   transurethral ressection of bladder tumor     Dr. Ottelin 03/29/17   Social History:  reports that he quit smoking about 10 years ago. His smoking use included cigarettes. He started smoking about 48 years ago. He has a 76 pack-year smoking history. He has never used smokeless tobacco. He reports that he does not drink alcohol and does not use drugs.  Allergies[1]  Family History  Problem Relation Age of Onset   Hypertension Other    Colon cancer Neg Hx    Stomach cancer Neg Hx    Esophageal cancer Neg Hx      Prior to Admission medications  Medication Sig Start Date End Date Taking? Authorizing Provider  acetaminophen  (TYLENOL ) 500 MG tablet Take 500-1,000 mg by mouth every 8 (eight) hours as needed for mild pain or headache.     [provider]  albuterol  (VENTOLIN  HFA) 108 (90 Base) MCG/ACT inhaler Inhale 2 puffs into the lungs every 6 (six) hours as needed for wheezing or shortness of breath. 10/26/22   [name], MD  amLODipine  (NORVASC ) 5 MG tablet Take 1 tablet (5 mg total) by mouth daily. 10/26/23   [name], MD  aspirin  EC 81 MG tablet Take 1 tablet (81 mg total) by mouth daily. 10/21/15   Marilyne Stephane DASEN, MD  atorvastatin  (LIPITOR ) 80 MG tablet Take 1 tablet (80 mg total) by mouth daily. 10/26/23    [name], MD  bisoprolol  (ZEBETA ) 5 MG tablet Take 1 tablet (5 mg total) by mouth in the morning. 12/29/23   Tolia, Sunit, DO  BREZTRI  AEROSPHERE 160-9-4.8 MCG/ACT AERO INHALE 2 PUFFS INTO THE LUNGS IN THE MORNING AND AT BEDTIME 05/10/23   Darlean Ozell NOVAK, MD  Budeson-Glycopyrrol-Formoterol  (BREZTRI  AEROSPHERE) 160-9-4.8 MCG/ACT AERO Inhale 2 puffs into the lungs in the morning and at bedtime. Patient not taking: Reported on 02/25/2024 05/13/23   Darlean Ozell NOVAK, MD  budesonide -glycopyrrolate -formoterol  (BREZTRI  AEROSPHERE) 160-9-4.8 MCG/ACT AERO inhaler Take 2 puffs first thing in am and then another 2 puffs about 12 hours later. 11/09/23   Darlean Ozell NOVAK, MD  famotidine  (PEPCID ) 20 MG tablet One daily after supper 03/04/23   Darlean Ozell NOVAK, MD  finasteride  (PROSCAR ) 5 MG tablet Take 1 tablet (5 mg total) by mouth daily. 07/30/22   Shona Layman BROCKS, MD  nitroGLYCERIN  (NITROSTAT ) 0.4 MG SL tablet Place 1 tablet (0.4 mg total) under the tongue every 5 (five) minutes as needed for chest pain. 12/16/23   Meng, Hao, PA  OXYGEN Inhale 2 L into the lungs at  bedtime.    [provider]  pantoprazole  (PROTONIX ) 40 MG tablet Take 1 tablet (40 mg total) by mouth daily. Take 30-60 min before first meal of the day 05/13/23   Darlean Ozell NOVAK, MD  ticagrelor  (BRILINTA ) 60 MG TABS tablet Take 1 tablet (60 mg total) by mouth 2 (two) times daily. Appointment Required For Further Refills [phone] 12/29/23   Michele Richardson, DO    Physical Exam: BP 118/66   Pulse (!) 117   Temp 98 F (36.7 C) (Oral)   Resp 18   Ht 5' 6 (1.676 m)   Wt 56.7 kg   SpO2 100%   BMI 20.18 kg/m  General: Pleasant, well-appearing man laying in bed. No acute distress. HEENT: Eclectic/AT. Anicteric sclera. Poor dentition. Dry mucous membrane. CV: Tachycardic. Regular rhythm. No murmurs, rubs, or gallops. No LE edema Pulmonary: On BiPAP. Lungs CTAB. Normal effort. Expiratory wheezes in the upper lung fields. Abdominal: Soft,  nontender, nondistended. Normal bowel sounds. Extremities: Palpable radial and DP pulses. Normal ROM. Skin: Warm and dry. No obvious rash or lesions. Neuro: A&Ox3. Moves all extremities. Normal sensation to light touch. No focal deficit. Psych: Normal mood and affect          Labs on Admission:  Basic Metabolic Panel: Recent Labs  Lab 04/18/24 1940 04/18/24 1943  NA 141 140  K 3.9 3.9  CL 98 98  CO2 28  --   GLUCOSE 190* 194*  BUN 11 12  CREATININE 0.80 0.90  CALCIUM  9.8  --    Liver Function Tests: Recent Labs  Lab 04/18/24 1940  AST 27  ALT 28  ALKPHOS 115  BILITOT 0.5  PROT 8.2*  ALBUMIN 4.5   No results for input(s): LIPASE, AMYLASE in the last 168 hours. No results for input(s): AMMONIA in the last 168 hours. CBC: Recent Labs  Lab 04/18/24 1940 04/18/24 1943  WBC 22.3*  --   NEUTROABS 17.3*  --   HGB 17.7* 18.7*  HCT 52.9* 55.0*  MCV 91.0  --   PLT 406*  --    Cardiac Enzymes: No results for input(s): CKTOTAL, CKMB, CKMBINDEX, TROPONINI in the last 168 hours. BNP (last 3 results) No results for input(s): BNP in the last 8760 hours.  ProBNP (last 3 results) No results for input(s): PROBNP in the last 8760 hours.  CBG: No results for input(s): GLUCAP in the last 168 hours.  Radiological Exams on Admission: DG Chest Port 1 View Result Date: 04/18/2024 CLINICAL DATA:  Shortness of breath. EXAM: PORTABLE CHEST 1 VIEW COMPARISON:  Chest radiograph dated 03/04/2023. FINDINGS: Background of emphysema. No focal consolidation, pleural effusion, or pneumothorax. The cardiac silhouette is within normal limits. No acute osseous pathology. IMPRESSION: No active disease. Electronically Signed   By: Vanetta Chou M.D.   On: 04/18/2024 19:57   My independent interpretation of the EKG: Sinus tach with PVCs, RAE and nonspecific ST changes  Assessment/Plan DVANTE HANDS is a 68 y.o. male with medical history significant for HTN, cardiac  arrest vfib/vtach 06/2013, NSTEMI/CAD with stent to RCA, COPD Gold stage III, chronic hypoxic respiratory failure on 2 L at night and as needed, former smoker, prediabetes, pulmonary nodule, GERD, HLD, Elev PSA (neg prostate bx 07/2022) and bladder cancer who presented via EMS for evaluation of shortness of breath, nausea vomiting and diarrhea and admitted for COPD exacerbation.  # COPD exacerbation - Presented with worsening shortness of breath, wheezing and productive cough - Pt with wheezing on lung auscultation -  CXR with no evidence of PNA; Neg covid, RSV and flu test - S/p IV solumedrol, IV mag and multiple DuoNebs - Placed on BiPAP due to increased work of breathing - Start IV Solu-Medrol  80 mg daily - Azithromycin  500 mg x 3 doses - Check full respiratory viral panel - Continue home bronchodilator - As needed BiPAP and duonebs  # Acute on chronic hypoxic respiratory failure - Patient reports he is on 2 L [place] at night and as needed - Increased O2 requirement to 7 L on admission due to COPD exacerbation - Continue BiPAP therapy with supplemental O2, wean as able - Incentive spirometer, flutter valve  # Acute cystitis - Urinalysis resulted showing positive nitrate, trace leuks, WBC > 50 and few bacteria - Patient denies any urinary symptoms but reports nausea and vomiting, and has significant leukocytosis - Start IV Rocephin  - F/u urine culture and blood culture - Trend CBC and fever curve  # Acute diarrhea # Nausea and vomiting - Patient reports 3-4 days of watery diarrhea with associated nausea and vomiting - Had watery bowel movement prior to ED visit but none since day - Follow-up C. difficile screen and GI panel - Start IV NS 1 L bolus followed by 100 cc/h - Enteric precautions  # HTN - BP initially elevated with SBP in the 140s to 160s with improved after admission - Continue amlodipine  and bisoprolol   # NSTEMI/CAD - Status post stenting to RCA, no chest pain -  Continue Brilinta  and atorvastatin   # HLD - Continue atorvastatin   # Hx of bladder cancer - S/p TURB in 2018 - Follow-up with urology in the outpatient  # GERD - Continue Protonix   DVT prophylaxis: Lovenox      Code Status: Full Code  Consults called: None  Family Communication: No family at bedside  Severity of Illness: The appropriate patient status for this patient is INPATIENT. Inpatient status is judged to be reasonable and necessary in order to provide the required intensity of service to ensure the patient's safety. The patient's presenting symptoms, physical exam findings, and initial radiographic and laboratory data in the context of their chronic comorbidities is felt to place them at high risk for further clinical deterioration. Furthermore, it is not anticipated that the patient will be medically stable for discharge from the hospital within 2 midnights of admission.   * I certify that at the point of admission it is my clinical judgment that the patient will require inpatient hospital care spanning beyond 2 midnights from the point of admission due to high intensity of service, high risk for further deterioration and high frequency of surveillance required.*  Level of care: Progressive   I personally spent a total of 75 minutes in the care of the patient today including preparing to see the patient, getting/reviewing separately obtained history, performing a medically appropriate exam/evaluation, placing orders, documenting clinical information in the EHR, and independently interpreting results.   Lou Claretta HERO, MD 04/18/2024, 11:04 PM [facility] Pager: [phone] Isaiah 41:10   If 7PM-7AM, please contact night-coverage [email or web address] Password [email or web address]     [1]  Allergies Allergen Reactions   Ace Inhibitors Other (See Comments)    Caused wheezing   Aspirin  Swelling and Other (See Comments)    Lips swell- still takes approx 2 times a week in  2021 Low dose ASA is fine, no problems   Ibuprofen Swelling and Other (See Comments)    Lips swell   Losartan  Other (See Comments)    Leg cramps

## 2024-04-18 NOTE — ED Provider Notes (Signed)
 [place] EMERGENCY DEPARTMENT AT [facility] Provider Note   CSN: [id number] Arrival date & time: 04/18/24  1906     Patient presents with: Shortness of Breath   Timothy [name] is a 68 y.o. male.   HPI 68 year old male presents in respiratory distress.  Patient has had a few days of vomiting and diarrhea and then now has acutely worse shortness of breath all day today.  EMS notes he was in respiratory distress and they have given 3 breathing treatments, IV Solu-Medrol , and IV magnesium  without much improvement.  The patient states he has had cough and congestion but denies fever, chest pain, abdominal pain.  No blood in the diarrhea.  Prior to Admission medications  Medication Sig Start Date End Date Taking? Authorizing Provider  acetaminophen  (TYLENOL ) 500 MG tablet Take 500-1,000 mg by mouth every 8 (eight) hours as needed for mild pain or headache.     [provider]  albuterol  (VENTOLIN  HFA) 108 (90 Base) MCG/ACT inhaler Inhale 2 puffs into the lungs every 6 (six) hours as needed for wheezing or shortness of breath. 10/26/22   [name], MD  amLODipine  (NORVASC ) 5 MG tablet Take 1 tablet (5 mg total) by mouth daily. 10/26/23   [name], MD  aspirin  EC 81 MG tablet Take 1 tablet (81 mg total) by mouth daily. 10/21/15   Marilyne Stephane DASEN, MD  atorvastatin  (LIPITOR ) 80 MG tablet Take 1 tablet (80 mg total) by mouth daily. 10/26/23   [name], MD  bisoprolol  (ZEBETA ) 5 MG tablet Take 1 tablet (5 mg total) by mouth in the morning. 12/29/23   Tolia, Sunit, DO  BREZTRI  AEROSPHERE 160-9-4.8 MCG/ACT AERO INHALE 2 PUFFS INTO THE LUNGS IN THE MORNING AND AT BEDTIME 05/10/23   Darlean Ozell NOVAK, MD  Budeson-Glycopyrrol-Formoterol  (BREZTRI  AEROSPHERE) 160-9-4.8 MCG/ACT AERO Inhale 2 puffs into the lungs in the morning and at bedtime. Patient not taking: Reported on 02/25/2024 05/13/23   Darlean Ozell NOVAK, MD  budesonide -glycopyrrolate -formoterol  (BREZTRI   AEROSPHERE) 160-9-4.8 MCG/ACT AERO inhaler Take 2 puffs first thing in am and then another 2 puffs about 12 hours later. 11/09/23   Darlean Ozell NOVAK, MD  famotidine  (PEPCID ) 20 MG tablet One daily after supper 03/04/23   Darlean Ozell NOVAK, MD  finasteride  (PROSCAR ) 5 MG tablet Take 1 tablet (5 mg total) by mouth daily. 07/30/22   Shona Layman JAYSON, MD  nitroGLYCERIN  (NITROSTAT ) 0.4 MG SL tablet Place 1 tablet (0.4 mg total) under the tongue every 5 (five) minutes as needed for chest pain. 12/16/23   Meng, Hao, PA  OXYGEN Inhale 2 L into the lungs at bedtime.    [provider]  pantoprazole  (PROTONIX ) 40 MG tablet Take 1 tablet (40 mg total) by mouth daily. Take 30-60 min before first meal of the day 05/13/23   Darlean Ozell NOVAK, MD  ticagrelor  (BRILINTA ) 60 MG TABS tablet Take 1 tablet (60 mg total) by mouth 2 (two) times daily. Appointment Required For Further Refills [phone] 12/29/23   Tolia, Sunit, DO    Allergies: Ace inhibitors, Aspirin , Ibuprofen, and Losartan     Review of Systems  Constitutional:  Negative for fever.  HENT:  Positive for congestion.   Respiratory:  Positive for cough and shortness of breath.   Cardiovascular:  Negative for chest pain and leg swelling.  Gastrointestinal:  Positive for diarrhea and vomiting. Negative for abdominal pain and blood in stool.    Updated Vital Signs BP 118/66   Pulse ROLLEN)  117   Temp 98 F (36.7 C) (Oral)   Resp 18   Ht 5' 6 (1.676 m)   Wt 56.7 kg   SpO2 100%   BMI 20.18 kg/m   Physical Exam Vitals and nursing note reviewed.  Constitutional:      Appearance: He is well-developed. He is ill-appearing and diaphoretic.  HENT:     Head: Normocephalic and atraumatic.  Cardiovascular:     Rate and Rhythm: Regular rhythm. Tachycardia present.     Heart sounds: Normal heart sounds.  Pulmonary:     Effort: Tachypnea and accessory muscle usage present.     Breath sounds: Decreased breath sounds and wheezing present.  Abdominal:      Palpations: Abdomen is soft.     Tenderness: There is no abdominal tenderness.  Musculoskeletal:     Right lower leg: No edema.     Left lower leg: No edema.  Skin:    General: Skin is warm.  Neurological:     Mental Status: He is alert.     (all labs ordered are listed, but only abnormal results are displayed) Labs Reviewed  COMPREHENSIVE METABOLIC PANEL WITH GFR - Abnormal; Notable for the following components:      Result Value   Glucose, Bld 190 (*)    Total Protein 8.2 (*)    All other components within normal limits  CBC WITH DIFFERENTIAL/PLATELET - Abnormal; Notable for the following components:   WBC 22.3 (*)    Hemoglobin 17.7 (*)    HCT 52.9 (*)    Platelets 406 (*)    Neutro Abs 17.3 (*)    Monocytes Absolute 2.4 (*)    Abs Immature Granulocytes 0.15 (*)    All other components within normal limits  I-STAT CG4 LACTIC ACID, ED - Abnormal; Notable for the following components:   Lactic Acid, Venous 2.3 (*)    All other components within normal limits  I-STAT CHEM 8, ED - Abnormal; Notable for the following components:   Glucose, Bld 194 (*)    Calcium , Ion 1.11 (*)    Hemoglobin 18.7 (*)    HCT 55.0 (*)    All other components within normal limits  RESP PANEL BY RT-PCR (RSV, FLU A&B, COVID)  RVPGX2  CULTURE, BLOOD (ROUTINE X 2)  CULTURE, BLOOD (ROUTINE X 2)  C DIFFICILE QUICK SCREEN W PCR REFLEX    RESPIRATORY PANEL BY PCR  EXPECTORATED SPUTUM ASSESSMENT W GRAM STAIN, RFLX TO RESP C  PROTIME-INR  URINALYSIS, W/ REFLEX TO CULTURE (INFECTION SUSPECTED)  HIV ANTIBODY (ROUTINE TESTING W REFLEX)  BASIC METABOLIC PANEL WITH GFR  CBC  I-STAT CG4 LACTIC ACID, ED  TROPONIN T, HIGH SENSITIVITY  TROPONIN T, HIGH SENSITIVITY    EKG: EKG Interpretation Date/Time:  Tuesday April 18 2024 19:20:49 EST Ventricular Rate:  119 PR Interval:  142 QRS Duration:  82 QT Interval:  294 QTC Calculation: 414 R Axis:   85  Text Interpretation: Sinus tachycardia  Ventricular premature complex Consider right atrial enlargement Borderline right axis deviation Borderline ST depression, diffuse leads Abnormal T, consider ischemia, diffuse leads Artifact in lead(s) I II III aVR aVL aVF V1 V2 V3 V4 V5 V6 and baseline wander in lead(s) II III aVL aVF V3 V4 V5 V6 Confirmed by Freddi Hamilton [phone]) on 04/18/2024 7:39:05 PM  Radiology: [facility] Chest Port 1 View Result Date: 04/18/2024 CLINICAL DATA:  Shortness of breath. EXAM: PORTABLE CHEST 1 VIEW COMPARISON:  Chest radiograph dated 03/04/2023. FINDINGS: Background of emphysema.  No focal consolidation, pleural effusion, or pneumothorax. The cardiac silhouette is within normal limits. No acute osseous pathology. IMPRESSION: No active disease. Electronically Signed   By: Vanetta Chou M.D.   On: 04/18/2024 19:57     Procedures   Medications Ordered in the ED  acetaminophen  (TYLENOL ) tablet 650 mg (has no administration in time range)    Or  acetaminophen  (TYLENOL ) suppository 650 mg (has no administration in time range)  senna-docusate (Senokot-S) tablet 1 tablet (has no administration in time range)  bisacodyl  (DULCOLAX) EC tablet 5 mg (has no administration in time range)  ondansetron  (ZOFRAN ) tablet 4 mg (has no administration in time range)    Or  ondansetron  (ZOFRAN ) injection 4 mg (has no administration in time range)  enoxaparin  (LOVENOX ) injection 40 mg (has no administration in time range)  azithromycin  (ZITHROMAX ) tablet 500 mg (has no administration in time range)  sodium chloride  0.9 % bolus 1,000 mL (has no administration in time range)    Followed by  0.9 %  sodium chloride  infusion (has no administration in time range)  methylPREDNISolone  sodium succinate (SOLU-MEDROL ) 125 mg/2 mL injection 80 mg (has no administration in time range)  lactated ringers  bolus 1,000 mL (0 mLs Intravenous Stopped 04/18/24 2112)  albuterol  (PROVENTIL ) (2.5 MG/3ML) 0.083% nebulizer solution (10 mg/hr Nebulization  Given 04/18/24 1953)  ipratropium (ATROVENT ) nebulizer solution 0.5 mg (0.5 mg Nebulization Given 04/18/24 1953)  cefTRIAXone  (ROCEPHIN ) 2 g in sodium chloride  0.9 % 100 mL IVPB (0 g Intravenous Stopped 04/18/24 2019)  azithromycin  (ZITHROMAX ) 500 mg in sodium chloride  0.9 % 250 mL IVPB (0 mg Intravenous Stopped 04/18/24 2120)                                    Medical Decision Making Amount and/or Complexity of Data Reviewed Independent Historian: EMS Labs: ordered.    Details: Leukocytosis Radiology: ordered and independent interpretation performed.    Details: No pneumonia ECG/medicine tests: ordered and independent interpretation performed.    Details: Sinus tachycardia  Risk Prescription drug management. Decision regarding hospitalization.   Patient presents with COPD exacerbation.  He is in respiratory distress but after being placed on BiPAP has had substantial improvement in work of breathing.  Given an hour-long albuterol  treatment as well.  Was given IV Rocephin  and azithromycin , though fortunately there is no pneumonia seen on chest x-ray afterwards.  Has had some viral symptoms recently.  Lactate is okay.  Will need admission, discussed with Dr. Lou.     Final diagnoses:  Acute on chronic respiratory failure, unspecified whether with hypoxia or hypercapnia (HCC)  COPD exacerbation [facility])    ED Discharge Orders     None          Freddi Hamilton, MD 04/18/24 2320

## 2024-04-18 NOTE — ED Triage Notes (Signed)
 Pt BIB EMS from home. EMS reports pt has had NVD x5days. COPD with increase 02 use, increased SHOB worsened today. EMS admin 10mg  albuterol , 1mg  atrovent , 125mg  solumedrol, 2g mag EMS VS 160/86, HR 128, 98.7 temp, 96% 3.5 L [place]

## 2024-04-18 NOTE — ED Notes (Signed)
Pt declined rectal temp

## 2024-04-19 ENCOUNTER — Encounter (HOSPITAL_COMMUNITY): Payer: Self-pay | Admitting: Student

## 2024-04-19 DIAGNOSIS — J9621 Acute and chronic respiratory failure with hypoxia: Secondary | ICD-10-CM | POA: Diagnosis not present

## 2024-04-19 DIAGNOSIS — J441 Chronic obstructive pulmonary disease with (acute) exacerbation: Secondary | ICD-10-CM | POA: Diagnosis not present

## 2024-04-19 DIAGNOSIS — E782 Mixed hyperlipidemia: Secondary | ICD-10-CM | POA: Diagnosis not present

## 2024-04-19 DIAGNOSIS — Z8551 Personal history of malignant neoplasm of bladder: Secondary | ICD-10-CM

## 2024-04-19 DIAGNOSIS — I25118 Atherosclerotic heart disease of native coronary artery with other forms of angina pectoris: Secondary | ICD-10-CM | POA: Diagnosis not present

## 2024-04-19 DIAGNOSIS — Z955 Presence of coronary angioplasty implant and graft: Secondary | ICD-10-CM | POA: Diagnosis not present

## 2024-04-19 DIAGNOSIS — N3000 Acute cystitis without hematuria: Secondary | ICD-10-CM

## 2024-04-19 LAB — URINALYSIS, W/ REFLEX TO CULTURE (INFECTION SUSPECTED)
Bilirubin Urine: NEGATIVE
Glucose, UA: NEGATIVE mg/dL
Ketones, ur: 15 mg/dL — AB
Nitrite: POSITIVE — AB
Protein, ur: 30 mg/dL — AB
Specific Gravity, Urine: >1.03 — ABNORMAL HIGH (ref 1.005–1.030)
WBC, UA: >50 WBC/hpf (ref 0–5)
pH: 5.5 (ref 5.0–8.0)

## 2024-04-19 LAB — CBC
HCT: 45.9 % (ref 39.0–52.0)
Hemoglobin: 15.5 g/dL (ref 13.0–17.0)
MCH: 30.5 pg (ref 26.0–34.0)
MCHC: 33.8 g/dL (ref 30.0–36.0)
MCV: 90.2 fL (ref 80.0–100.0)
Platelets: 346 K/uL (ref 150–400)
RBC: 5.09 MIL/uL (ref 4.22–5.81)
RDW: 12.6 % (ref 11.5–15.5)
WBC: 9.2 K/uL (ref 4.0–10.5)
nRBC: 0 % (ref 0.0–0.2)

## 2024-04-19 LAB — BASIC METABOLIC PANEL WITH GFR
Anion gap: 13 (ref 5–15)
BUN: 10 mg/dL (ref 8–23)
CO2: 25 mmol/L (ref 22–32)
Calcium: 9.1 mg/dL (ref 8.9–10.3)
Chloride: 101 mmol/L (ref 98–111)
Creatinine, Ser: 0.71 mg/dL (ref 0.61–1.24)
GFR, Estimated: >60 mL/min (ref >60)
Glucose, Bld: 169 mg/dL — ABNORMAL HIGH (ref 70–99)
Potassium: 3.7 mmol/L (ref 3.5–5.1)
Sodium: 139 mmol/L (ref 135–145)

## 2024-04-19 LAB — HIV ANTIBODY (ROUTINE TESTING W REFLEX): HIV Screen 4th Generation wRfx: NONREACTIVE

## 2024-04-19 LAB — MRSA NEXT GEN BY PCR, NASAL: MRSA by PCR Next Gen: NOT DETECTED

## 2024-04-19 MED ORDER — PANTOPRAZOLE SODIUM 40 MG PO TBEC
40.0000 mg | DELAYED_RELEASE_TABLET | Freq: Every day | ORAL | Status: DC
  Administered 2024-04-19 – 2024-04-26 (×8): 40 mg via ORAL
  Filled 2024-04-19 (×8): qty 1

## 2024-04-19 MED ORDER — BISOPROLOL FUMARATE 5 MG PO TABS
5.0000 mg | ORAL_TABLET | Freq: Every day | ORAL | Status: DC
  Administered 2024-04-19 – 2024-04-26 (×8): 5 mg via ORAL
  Filled 2024-04-19 (×8): qty 1

## 2024-04-19 MED ORDER — BUDESON-GLYCOPYRROL-FORMOTEROL 160-9-4.8 MCG/ACT IN AERO
2.0000 | INHALATION_SPRAY | Freq: Two times a day (BID) | RESPIRATORY_TRACT | Status: DC
  Administered 2024-04-19 – 2024-04-22 (×6): 2 via RESPIRATORY_TRACT
  Filled 2024-04-19: qty 5.9

## 2024-04-19 MED ORDER — ALUM & MAG HYDROXIDE-SIMETH 200-200-20 MG/5ML PO SUSP
30.0000 mL | Freq: Four times a day (QID) | ORAL | Status: DC | PRN
  Administered 2024-04-19 – 2024-04-20 (×2): 30 mL via ORAL
  Filled 2024-04-19 (×2): qty 30

## 2024-04-19 MED ORDER — TICAGRELOR 60 MG PO TABS
60.0000 mg | ORAL_TABLET | Freq: Two times a day (BID) | ORAL | Status: DC
  Administered 2024-04-19 – 2024-04-26 (×14): 60 mg via ORAL
  Filled 2024-04-19 (×16): qty 1

## 2024-04-19 MED ORDER — IPRATROPIUM-ALBUTEROL 0.5-2.5 (3) MG/3ML IN SOLN
3.0000 mL | Freq: Four times a day (QID) | RESPIRATORY_TRACT | Status: DC | PRN
  Administered 2024-04-19 – 2024-04-22 (×10): 3 mL via RESPIRATORY_TRACT
  Filled 2024-04-19 (×10): qty 3

## 2024-04-19 MED ORDER — AMLODIPINE BESYLATE 5 MG PO TABS
5.0000 mg | ORAL_TABLET | Freq: Every day | ORAL | Status: DC
  Administered 2024-04-19 – 2024-04-26 (×8): 5 mg via ORAL
  Filled 2024-04-19 (×8): qty 1

## 2024-04-19 MED ORDER — ATORVASTATIN CALCIUM 80 MG PO TABS
80.0000 mg | ORAL_TABLET | Freq: Every day | ORAL | Status: DC
  Administered 2024-04-19 – 2024-04-26 (×8): 80 mg via ORAL
  Filled 2024-04-19: qty 1
  Filled 2024-04-19: qty 2
  Filled 2024-04-19 (×6): qty 1

## 2024-04-19 MED ORDER — SODIUM CHLORIDE 0.9 % IV SOLN
1.0000 g | INTRAVENOUS | Status: DC
  Administered 2024-04-19: 16:00:00 1 g via INTRAVENOUS
  Filled 2024-04-19: qty 10

## 2024-04-19 NOTE — Plan of Care (Signed)
  Problem: Education: Goal: Knowledge of General Education information will improve Description: Including pain rating scale, medication(s)/side effects and non-pharmacologic comfort measures Outcome: Progressing   Problem: Clinical Measurements: Goal: Will remain free from infection Outcome: Progressing   Problem: Coping: Goal: Level of anxiety will decrease Outcome: Progressing   Problem: Elimination: Goal: Will not experience complications related to urinary retention Outcome: Progressing   Problem: Pain Managment: Goal: General experience of comfort will improve and/or be controlled Outcome: Progressing

## 2024-04-19 NOTE — TOC CM/SW Note (Signed)
 Transition of Care [facility]) - Inpatient Brief Assessment   Patient Details  Name: Timothy [name] MRN: [id number] Date of Birth: 12/22/1955  Transition of Care [facility]) CM/SW Contact:    Lauraine FORBES Saa, LCSWA Phone Number: 04/19/2024, 3:32 PM   Clinical Narrative:  3:32 PM Per chart review, patient resides at home significant other. Patient has a PCP and insurance. Patient does not have SNF history. Patient has CIR history with [facility]. Patient has HH history with [facility]. Patient has DME (oxygen from 2015 and 2023) history with [facility] and [facility]. Patient's preferred pharmacy is [facility] [phone] [facility]. No TOC needs identified at this time. TOC will continue to follow.  Transition of Care Asessment: Insurance and Status: Insurance coverage has been reviewed Patient has primary care physician: Yes Home environment has been reviewed: Private Residence Prior level of function:: N/A Prior/Current Home Services: No current home services Social Drivers of Health Review: SDOH reviewed no interventions necessary Readmission risk has been reviewed: Yes (Currently Green 10%) Transition of care needs: no transition of care needs at this time

## 2024-04-19 NOTE — Progress Notes (Signed)
 Progress Note   Patient: Timothy [name] FMW:[id number] DOB: 03-26-1956 DOA: 04/18/2024     1 DOS: the patient was seen and examined on 04/19/2024   Brief hospital course: ABDIRAHMAN CHITTUM is a 68 y.o. male with medical history significant for HTN, cardiac arrest vfib/vtach 06/2013, NSTEMI/CAD with stent to RCA, COPD Gold stage III, chronic hypoxic respiratory failure on 2 L at night and as needed, former smoker, prediabetes, pulmonary nodule, GERD, HLD, Elev PSA (neg prostate bx 07/2022) and bladder cancer who presented via EMS for evaluation of shortness of breath, nausea vomiting and diarrhea.  Patient is admitted to hospitalist service for acute on chronic hypoxic respiratory failure, COPD exacerbation.  Assessment and Plan: Acute on chronic hypoxic respiratory failure COPD exacerbation- Patient had worse shortness of breath, wheezing and productive cough, hypoxia requiring 7 L supplemental oxygen upon presentation. Today patient feels better, still exertional dyspnea, on 4 L supplemental oxygen. Continue IV Solu-Medrol  80 mg daily. Continue DuoNebs Q4 hourly, azithromycin . Respiratory viral panel negative. Continue home inhalers, antitussive medication. Continue to wean supplemental oxygen to baseline 2 L. Encourage incentive spirometry, flutter valve.  Abnormal urinalysis- Follow urine cultures. Continue Rocephin  therapy.  Acute diarrhea, nausea, vomiting- C. difficile studies negative. If persistent will get GI panel. Continue antiemetics, gentle IV fluids.  Hypertension: Continue home dose amlodipine , bisoprolol .  CAD status post RCA stent: Continue home dose Brilinta , statin.  History of bladder cancer status post TURP: Urology follow-up as outpatient.  GERD on Protonix .    Out of bed to chair. Incentive spirometry. Nursing supportive care. Fall, aspiration precautions. Diet:  Diet Orders (From admission, onward)     Start     Ordered   04/18/24 2251  Diet  regular Room service appropriate? Yes; Fluid consistency: Thin  Diet effective now       Question Answer Comment  Room service appropriate? Yes   Fluid consistency: Thin      04/18/24 2255           DVT prophylaxis: enoxaparin  (LOVENOX ) injection 40 mg Start: 04/19/24 1000  Level of care: Progressive   Code Status: Full Code  Subjective: Patient is seen and examined today morning in the emergency department.  He is lying in bed, has abdominal gas discomfort.  Has trouble breathing with exertion.  Currently on 4 L supplemental oxygen.  Physical Exam: Vitals:   04/19/24 0956 04/19/24 1000 04/19/24 1112 04/19/24 1123  BP: 126/77 137/84 (!) 147/77   Pulse:  99 (!) 107 (!) 107  Resp:  16 (!) 22 20  Temp:   98.2 F (36.8 C)   TempSrc:   Oral   SpO2:  92% 92% 90%  Weight:   54.8 kg   Height:   5' 6 (1.676 m)     General - Elderly Caucasian thin built male, mild respiratory distress HEENT - PERRLA, EOMI, atraumatic head, non tender sinuses. Lung - Clear, diffuse rhonchi, wheezes. Heart - S1, S2 heard, no murmurs, rubs, no pedal edema. Abdomen - Soft, non tender, distended, bowel sounds good Neuro - Alert, awake and oriented x 3, non focal exam. Skin - Warm and dry.  Data Reviewed:      Latest Ref Rng & Units 04/19/2024    5:18 AM 04/18/2024    7:43 PM 04/18/2024    7:40 PM  CBC  WBC 4.0 - 10.5 K/uL 9.2   22.3   Hemoglobin 13.0 - 17.0 g/dL 84.4  81.2  82.2   Hematocrit 39.0 - 52.0 %  45.9  55.0  52.9   Platelets 150 - 400 K/uL 346   406       Latest Ref Rng & Units 04/19/2024    5:18 AM 04/18/2024    7:43 PM 04/18/2024    7:40 PM  BMP  Glucose 70 - 99 mg/dL 830  805  809   BUN 8 - 23 mg/dL 10  12  11    Creatinine 0.61 - 1.24 mg/dL 9.28  9.09  9.19   Sodium 135 - 145 mmol/L 139  140  141   Potassium 3.5 - 5.1 mmol/L 3.7  3.9  3.9   Chloride 98 - 111 mmol/L 101  98  98   CO2 22 - 32 mmol/L 25   28   Calcium  8.9 - 10.3 mg/dL 9.1   9.8    DG Chest Port 1  View Result Date: 04/18/2024 CLINICAL DATA:  Shortness of breath. EXAM: PORTABLE CHEST 1 VIEW COMPARISON:  Chest radiograph dated 03/04/2023. FINDINGS: Background of emphysema. No focal consolidation, pleural effusion, or pneumothorax. The cardiac silhouette is within normal limits. No acute osseous pathology. IMPRESSION: No active disease. Electronically Signed   By: Vanetta Chou M.D.   On: 04/18/2024 19:57    Family Communication: Discussed with patient, he understand and agree. All questions answered.  Disposition: Status is: Inpatient Remains inpatient appropriate because: hypoxia, IV steroids, abx, wean o2, PT/ OT  Planned Discharge Destination: Home     Time spent: 43 minutes  Author: Concepcion Riser, MD 04/19/2024 1:34 PM Secure chat 7am to 7pm For on call review [email or web address].

## 2024-04-19 NOTE — Progress Notes (Signed)
 OT Cancellation Note  Patient Details Name: Timothy [name] MRN: [id number] DOB: [birth year]   Cancelled Treatment:    Reason Eval/Treat Not Completed: Medical issues which prohibited therapy. Pt reporting feeling nauseous. Per RN, pt has been nauseous all day, despite meds. Pt politely declining therapy for the day. OT will follow up tomorrow.   [name], OT  Acute Rehabilitation Services Office [phone] Secure chat preferred   Adrianne GORMAN Savers 04/19/2024, 4:35 PM

## 2024-04-20 DIAGNOSIS — E782 Mixed hyperlipidemia: Secondary | ICD-10-CM | POA: Diagnosis not present

## 2024-04-20 DIAGNOSIS — J441 Chronic obstructive pulmonary disease with (acute) exacerbation: Secondary | ICD-10-CM | POA: Diagnosis not present

## 2024-04-20 DIAGNOSIS — Z955 Presence of coronary angioplasty implant and graft: Secondary | ICD-10-CM | POA: Diagnosis not present

## 2024-04-20 DIAGNOSIS — J9621 Acute and chronic respiratory failure with hypoxia: Secondary | ICD-10-CM | POA: Diagnosis not present

## 2024-04-20 LAB — RESPIRATORY PANEL BY PCR

## 2024-04-20 LAB — URINE CULTURE: Culture: <10000 — AB

## 2024-04-20 LAB — C DIFFICILE QUICK SCREEN W PCR REFLEX
C Diff antigen: NEGATIVE
C Diff interpretation: NOT DETECTED
C Diff toxin: NEGATIVE

## 2024-04-20 MED ORDER — PREDNISONE 20 MG PO TABS: 60.0000 mg | ORAL_TABLET | Freq: Two times a day (BID) | ORAL | Status: DC

## 2024-04-20 MED ORDER — METHYLPREDNISOLONE SODIUM SUCC 125 MG IJ SOLR
60.0000 mg | Freq: Two times a day (BID) | INTRAMUSCULAR | Status: DC
  Administered 2024-04-20 – 2024-04-21 (×3): 60 mg via INTRAVENOUS
  Filled 2024-04-20 (×3): qty 2

## 2024-04-20 MED ORDER — ENSURE PLUS HIGH PROTEIN PO LIQD
237.0000 mL | Freq: Two times a day (BID) | ORAL | Status: DC
  Administered 2024-04-21 – 2024-04-25 (×4): 237 mL via ORAL

## 2024-04-20 MED ORDER — PREDNISONE 20 MG PO TABS
60.0000 mg | ORAL_TABLET | Freq: Every day | ORAL | Status: DC
  Administered 2024-04-20: 10:00:00 60 mg via ORAL
  Filled 2024-04-20: qty 3

## 2024-04-20 NOTE — Progress Notes (Signed)
 Progress Note   Patient: Timothy [name] FMW:[id number] DOB: 12-12-1955 DOA: 04/18/2024     2 DOS: the patient was seen and examined on 04/20/2024   Brief hospital course: URIE LOUGHNER is a 68 y.o. male with medical history significant for HTN, cardiac arrest vfib/vtach 06/2013, NSTEMI/CAD with stent to RCA, COPD Gold stage III, chronic hypoxic respiratory failure on 2 L at night and as needed, former smoker, prediabetes, pulmonary nodule, GERD, HLD, Elev PSA (neg prostate bx 07/2022) and bladder cancer who presented via EMS for evaluation of shortness of breath, nausea vomiting and diarrhea.  Patient is admitted to hospitalist service for acute on chronic hypoxic respiratory failure, COPD exacerbation.  Assessment and Plan: Acute on chronic hypoxic respiratory failure COPD exacerbation- Patient had worse shortness of breath, wheezing and productive cough, hypoxia requiring 7 L supplemental oxygen upon presentation. Patient feels tight today, has dyspnea, on 4 L supplemental oxygen. IV Solu-Medrol  changed to 60mg  q 12hr. Continue DuoNebs Q4 hourly, azithromycin . Respiratory viral panel negative. Continue home inhalers, antitussive medication. Continue to wean supplemental oxygen to baseline 2 L. Encourage incentive spirometry, flutter valve.  Abnormal urinalysis- Urine cultures insignificant growth. Stop Rocephin  therapy.  Acute diarrhea, nausea, vomiting- C. difficile studies negative. Diarrhea better. Eating fair. Continue antiemetics, stop IV fluids.  Hypertension: Continue home dose amlodipine , bisoprolol .  CAD status post RCA stent: Continue home dose Brilinta , statin.  History of bladder cancer status post TURP: Urology follow-up as outpatient.  GERD on Protonix .    Out of bed to chair. Incentive spirometry. Nursing supportive care. Fall, aspiration precautions. Diet:  Diet Orders (From admission, onward)     Start     Ordered   04/18/24 2251  Diet regular Room  service appropriate? Yes; Fluid consistency: Thin  Diet effective now       Question Answer Comment  Room service appropriate? Yes   Fluid consistency: Thin      04/18/24 2255           DVT prophylaxis: enoxaparin  (LOVENOX ) injection 40 mg Start: 04/19/24 1000  Level of care: Progressive   Code Status: Full Code  Subjective: Patient is seen and examined today morning. He is short of breath, sitting in chair, on 4 L supplemental oxygen. Feels tight chest. Did not get out of bed.  Physical Exam: Vitals:   04/20/24 0619 04/20/24 0759 04/20/24 0949 04/20/24 1106  BP:  121/64 121/64 117/67  Pulse:  81  86  Resp:    19  Temp:  98 F (36.7 C)  98.1 F (36.7 C)  TempSrc:  Oral  Oral  SpO2: 94% 95%  96%  Weight:      Height:        General - Elderly Caucasian thin built male, moderate respiratory distress HEENT - PERRLA, EOMI, atraumatic head, non tender sinuses. Lung - decreased air entry, diffuse rhonchi, wheezes. Using accessory muscles. Heart - S1, S2 heard, no murmurs, rubs, no pedal edema. Abdomen - Soft, non tender, distended, bowel sounds good Neuro - Alert, awake and oriented x 3, non focal exam. Skin - Warm and dry.  Data Reviewed:      Latest Ref Rng & Units 04/19/2024    5:18 AM 04/18/2024    7:43 PM 04/18/2024    7:40 PM  CBC  WBC 4.0 - 10.5 K/uL 9.2   22.3   Hemoglobin 13.0 - 17.0 g/dL 84.4  81.2  82.2   Hematocrit 39.0 - 52.0 % 45.9  55.0  52.9  Platelets 150 - 400 K/uL 346   406       Latest Ref Rng & Units 04/19/2024    5:18 AM 04/18/2024    7:43 PM 04/18/2024    7:40 PM  BMP  Glucose 70 - 99 mg/dL 830  805  809   BUN 8 - 23 mg/dL 10  12  11    Creatinine 0.61 - 1.24 mg/dL 9.28  9.09  9.19   Sodium 135 - 145 mmol/L 139  140  141   Potassium 3.5 - 5.1 mmol/L 3.7  3.9  3.9   Chloride 98 - 111 mmol/L 101  98  98   CO2 22 - 32 mmol/L 25   28   Calcium  8.9 - 10.3 mg/dL 9.1   9.8    DG Chest Port 1 View Result Date: 04/18/2024 CLINICAL DATA:   Shortness of breath. EXAM: PORTABLE CHEST 1 VIEW COMPARISON:  Chest radiograph dated 03/04/2023. FINDINGS: Background of emphysema. No focal consolidation, pleural effusion, or pneumothorax. The cardiac silhouette is within normal limits. No acute osseous pathology. IMPRESSION: No active disease. Electronically Signed   By: Vanetta Chou M.D.   On: 04/18/2024 19:57    Family Communication: Discussed with patient, he understand and agree. All questions answered.  Disposition: Status is: Inpatient Remains inpatient appropriate because: hypoxia, steroids, abx, wean o2, PT/ OT  Planned Discharge Destination: Home     Time spent: 45 minutes  Author: Concepcion Riser, MD 04/20/2024 11:39 AM Secure chat 7am to 7pm For on call review [email or web address].

## 2024-04-20 NOTE — Evaluation (Signed)
 Physical Therapy Evaluation Patient Details Name: Timothy [name] MRN: [id number] DOB: [birth year] Today's Date: 04/20/2024  History of Present Illness  Pt is a 68 y.o male admitted 12/16 for N/V/D. Hypertensive in ED, admitted for COPD exacerbation. PMH: COPD, HTN, cardiac arrest, NSTEMI, CAD, HLD  Clinical Impression  Pt presents with condition above and deficits mentioned below, see PT Problem List. PTA, he was independent without DME, living with his girlfriend and grandson in a 1-level house with a ramped entry. He reports using 2-3L of supplemental O2 via [employer] only as needed at baseline. Currently, the pt is demonstrating deficits in activity tolerance, balance, and gross strength. He was only able to tolerate ambulating ~25 ft with a RW before needing to sit to rest due to his DOE this date. He only needed CGA for safety to mobilize this date though. So I anticipate the pt will progress quickly as his SOB improves and therefore will be able to d/c home with HHPT follow-up and the DME listed below. Will continue to follow acutely.        If plan is discharge home, recommend the following: A little help with bathing/dressing/bathroom;Assistance with cooking/housework;Assist for transportation   Can travel by private vehicle        Equipment Recommendations Rollator (4 wheels);Other (comment) (tub bench/shower seat)  Recommendations for Other Services       Functional Status Assessment Patient has had a recent decline in their functional status and demonstrates the ability to make significant improvements in function in a reasonable and predictable amount of time.     Precautions / Restrictions Precautions Precautions: Fall;Other (comment) Recall of Precautions/Restrictions: Intact Precaution/Restrictions Comments: watch SpO2 (on 2-3L O2 PRN at baseline) Restrictions Weight Bearing Restrictions Per Provider Order: No      Mobility  Bed Mobility Overal bed mobility: Needs  Assistance Bed Mobility: Supine to Sit     Supine to sit: Contact guard, HOB elevated     General bed mobility comments: CGA for safety exiting L EOB, HOB elevated    Transfers Overall transfer level: Needs assistance Equipment used: Rolling walker (2 wheels) Transfers: Sit to/from Stand Sit to Stand: Contact guard assist           General transfer comment: Pt able to stand from EOB to RW without LOB, CGA for safety with good initiation to power up to stand noted    Ambulation/Gait Ambulation/Gait assistance: Contact guard assist Gait Distance (Feet): 25 Feet Assistive device: Rolling walker (2 wheels) Gait Pattern/deviations: Step-through pattern, Decreased step length - right, Decreased step length - left, Decreased stride length Gait velocity: reduced Gait velocity interpretation: <1.8 ft/sec, indicate of risk for recurrent falls   General Gait Details: Pt limited by DOE, only tolerating ambulating around the EOB this date before needing to sit to recover his SOB. No LOB, CGA for safety  Stairs            Wheelchair Mobility     Tilt Bed    Modified Rankin (Stroke Patients Only)       Balance Overall balance assessment: Needs assistance Sitting-balance support: No upper extremity supported, Feet supported Sitting balance-Leahy Scale: Good     Standing balance support: Bilateral upper extremity supported, Reliant on assistive device for balance, During functional activity Standing balance-Leahy Scale: Poor Standing balance comment: using RW to ambulate at this time  Pertinent Vitals/Pain Pain Assessment Pain Assessment: Faces Faces Pain Scale: Hurts little more Pain Location: discomfort with SOB Pain Descriptors / Indicators: Discomfort Pain Intervention(s): Limited activity within patient's tolerance, Monitored during session    Home Living Family/patient expects to be discharged to:: Private  residence Living Arrangements: Spouse/significant other;Other relatives (grandson) Available Help at Discharge: Family;Available PRN/intermittently (girlfriend works, grandson in school) Type of Home: House Home Access: Ramped entrance       Home Layout: One level Home Equipment: None Additional Comments: on 2-3L O2 PRN    Prior Function Prior Level of Function : Independent/Modified Independent             Mobility Comments: No AD, no recent falls ADLs Comments: Drives, does not work     Extremity/Trunk Assessment   Upper Extremity Assessment Upper Extremity Assessment: Defer to OT evaluation    Lower Extremity Assessment Lower Extremity Assessment: Generalized weakness (denied numbness/tingling bil)    Cervical / Trunk Assessment Cervical / Trunk Assessment: Normal  Communication   Communication Communication: No apparent difficulties    Cognition Arousal: Alert Behavior During Therapy: WFL for tasks assessed/performed   PT - Cognitive impairments: No apparent impairments                       PT - Cognition Comments: Voiced awareness of his need to sit to rest when he became SOB, indicating he is aware of his functional limits. Following commands: Intact       Cueing Cueing Techniques: Verbal cues     General Comments General comments (skin integrity, edema, etc.): SpO2 >/= 90% on 4L    Exercises     Assessment/Plan    PT Assessment Patient needs continued PT services  PT Problem List Decreased strength;Decreased activity tolerance;Decreased balance;Decreased mobility;Cardiopulmonary status limiting activity       PT Treatment Interventions DME instruction;Gait training;Functional mobility training;Therapeutic activities;Therapeutic exercise;Balance training;Neuromuscular re-education;Patient/family education    PT Goals (Current goals can be found in the Care Plan section)  Acute Rehab PT Goals Patient Stated Goal: to get better PT  Goal Formulation: With patient Time For Goal Achievement: 05/04/24 Potential to Achieve Goals: Good    Frequency Min 2X/week     Co-evaluation               AM-PAC PT 6 Clicks Mobility  Outcome Measure Help needed turning from your back to your side while in a flat bed without using bedrails?: A Little Help needed moving from lying on your back to sitting on the side of a flat bed without using bedrails?: A Little Help needed moving to and from a bed to a chair (including a wheelchair)?: A Little Help needed standing up from a chair using your arms (e.g., wheelchair or bedside chair)?: A Little Help needed to walk in hospital room?: A Little Help needed climbing 3-5 steps with a railing? : A Little 6 Click Score: 18    End of Session Equipment Utilized During Treatment: Oxygen Activity Tolerance: Patient tolerated treatment well Patient left: in chair;with call bell/phone within reach;with chair alarm set Nurse Communication: Mobility status;Other (comment) (sats) PT Visit Diagnosis: Unsteadiness on feet (R26.81);Other abnormalities of gait and mobility (R26.89);Muscle weakness (generalized) (M62.81);Difficulty in walking, not elsewhere classified (R26.2)    Time: 8878-8856 PT Time Calculation (min) (ACUTE ONLY): 22 min   Charges:   PT Evaluation $PT Eval Moderate Complexity: 1 Mod   PT General Charges $$ ACUTE PT VISIT: 1 Visit  Theo Ferretti, PT, DPT Acute Rehabilitation Services  Office: [phone]   Theo CHRISTELLA Ferretti 04/20/2024, 1:07 PM

## 2024-04-20 NOTE — Evaluation (Signed)
 Occupational Therapy Evaluation Patient Details Name: Timothy [name] MRN: [id number] DOB: 04-28-1956 Today's Date: 04/20/2024   History of Present Illness   Pt is a 68 y.o male admitted 12/16 for N/V/D. Hypertensive in ED, admitted for COPD exacerbation. PMH: COPD, HTN, cardiac arrest, NSTEMI, CAD, HLD     Clinical Impressions Pt admitted based on above, and was seen based on problem list below. PTA pt was independent with ADLs and IADLs. Today pt is requiring set up  to min  assist for ADLs. Functional transfers are  CGA. Pt primarily limited by decreased activity tolerance, reporting RPE of 8/10 with toileting activity, required 5L O2 for activity. Anticipate as medical status improves, pt will progress well, no follow up OT needs. OT will continue to follow acutely to maximize functional independence.     If plan is discharge home, recommend the following:   A little help with walking and/or transfers;A little help with bathing/dressing/bathroom;Assistance with cooking/housework     Functional Status Assessment   Patient has had a recent decline in their functional status and demonstrates the ability to make significant improvements in function in a reasonable and predictable amount of time.     Equipment Recommendations   Tub/shower seat      Precautions/Restrictions   Precautions Precautions: Fall;Other (comment) Recall of Precautions/Restrictions: Intact Precaution/Restrictions Comments: watch SpO2 Restrictions Weight Bearing Restrictions Per Provider Order: No     Mobility Bed Mobility     General bed mobility comments: Received on BSC, returned to recliner    Transfers Overall transfer level: Needs assistance Equipment used: None Transfers: Bed to chair/wheelchair/BSC       Step pivot transfers: Contact guard assist     General transfer comment: CGA no AD BSC to recliner      Balance Overall balance assessment: Needs  assistance Sitting-balance support: No upper extremity supported, Feet supported Sitting balance-Leahy Scale: Good     Standing balance support: No upper extremity supported Standing balance-Leahy Scale: Poor Standing balance comment: Benefits from BUE support         ADL either performed or assessed with clinical judgement   ADL Overall ADL's : Needs assistance/impaired Eating/Feeding: Set up;Sitting   Grooming: Set up;Sitting;Wash/dry hands;Oral care;Wash/dry face   Upper Body Bathing: Set up;Sitting   Lower Body Bathing: Minimal assistance;Sit to/from stand   Upper Body Dressing : Set up;Sitting   Lower Body Dressing: Sit to/from stand;Minimal assistance   Toilet Transfer: Contact guard assist;Stand-pivot;BSC/3in1   Toileting- Clothing Manipulation and Hygiene: Contact guard assist;Sit to/from stand       Functional mobility during ADLs: Contact guard assist General ADL Comments: Reporting RPE of 8/10 with BSC transfer     Vision Baseline Vision/History: 0 No visual deficits Vision Assessment?: No apparent visual deficits            Pertinent Vitals/Pain Pain Assessment Pain Assessment: No/denies pain Faces Pain Scale: Hurts little more Pain Intervention(s): Limited activity within patient's tolerance     Extremity/Trunk Assessment Upper Extremity Assessment Upper Extremity Assessment: Generalized weakness   Lower Extremity Assessment Lower Extremity Assessment: Defer to PT evaluation   Cervical / Trunk Assessment Cervical / Trunk Assessment: Normal   Communication Communication Communication: No apparent difficulties   Cognition Arousal: Alert Behavior During Therapy: WFL for tasks assessed/performed Cognition: No apparent impairments       Following commands: Intact       Cueing  General Comments   Cueing Techniques: Verbal cues  Received on 4L sats at 90% pt  with moderate DOE on BSC. Increased to 5L with activity. Left resting on 4L  at 92%           Home Living Family/patient expects to be discharged to:: Private residence Living Arrangements: Spouse/significant other;Other relatives (grandson) Available Help at Discharge: Family;Available PRN/intermittently (girlfriend works, grandson in school) Type of Home: House Home Access: Ramped entrance     Home Layout: One level     Bathroom Shower/Tub: Chief Strategy Officer: Handicapped height     Home Equipment: None   Additional Comments: on 2-3L O2 PRN      Prior Functioning/Environment Prior Level of Function : Independent/Modified Independent             Mobility Comments: No AD, no recent falls ADLs Comments: Ind, driving    OT Problem List: Decreased activity tolerance;Impaired balance (sitting and/or standing);Decreased knowledge of use of DME or AE;Decreased knowledge of precautions   OT Treatment/Interventions: Self-care/ADL training;Therapeutic exercise;Energy conservation;DME and/or AE instruction;Therapeutic activities;Patient/family education;Balance training      OT Goals(Current goals can be found in the care plan section)   Acute Rehab OT Goals Patient Stated Goal: To feel better OT Goal Formulation: With patient Time For Goal Achievement: 05/04/24 Potential to Achieve Goals: Good   OT Frequency:  Min 2X/week       AM-PAC OT 6 Clicks Daily Activity     Outcome Measure Help from another person eating meals?: None Help from another person taking care of personal grooming?: A Little Help from another person toileting, which includes using toliet, bedpan, or urinal?: A Little Help from another person bathing (including washing, rinsing, drying)?: A Little Help from another person to put on and taking off regular upper body clothing?: A Little Help from another person to put on and taking off regular lower body clothing?: A Little 6 Click Score: 19   End of Session Equipment Utilized During Treatment:  Oxygen Nurse Communication: Mobility status  Activity Tolerance: Patient tolerated treatment well Patient left: in chair;with call bell/phone within reach;with chair alarm set  OT Visit Diagnosis: Unsteadiness on feet (R26.81);Muscle weakness (generalized) (M62.81);Other abnormalities of gait and mobility (R26.89)                Time: 8557-8496 OT Time Calculation (min): 21 min Charges:  OT General Charges $OT Visit: 1 Visit OT Evaluation $OT Eval Moderate Complexity: 1 Mod  Adrianne BROCKS, OT  Acute Rehabilitation Services Office [phone] Secure chat preferred   Adrianne GORMAN Savers 04/20/2024, 4:27 PM

## 2024-04-20 NOTE — TOC Initial Note (Addendum)
 Transition of Care Timothy [name]) - Initial/Assessment Note    Patient Details  Name: Timothy [name] MRN: [id number] Date of Birth: [birth year]  Transition of Care Timothy [name]) CM/SW Contact:    Timothy KANDICE Stain, RN Phone Number: 04/20/2024, 3:15 PM  Clinical Narrative:                  Spoke to patient regarding transition needs. Patient is agreeable to home health and DME-rollator. Patient has used [facility] in the past and will like to use them again. [name] with [facility] accepted referral.  Patient has home 02 from [facility], patient needs portable tank and rollator. Referral sent to Timothy [name]  with [facility].  Patient has transportation.  Address, Phone number and PCP verified. ICM (Inpatient Care Management) will continue to follow for needs.  Need home health PT, OT orders.  Expected Discharge Plan: Home w Home Health Services     Patient Goals and CMS Choice Patient states their goals for this hospitalization and ongoing recovery are:: return home CMS Medicare.gov Compare Post Acute Care list provided to:: Patient Choice offered to / list presented to : Patient      Expected Discharge Plan and Services   Discharge Planning Services: CM Consult Post Acute Care Choice: Durable Medical Equipment, Home Health Living arrangements for the past 2 months: Single Family Home                 DME Arranged: Walker rolling with seat, Oxygen DME Agency: [facility] Date DME Agency Contacted: 04/20/24 Time DME Agency Contacted: [phone] Representative spoke with at DME Agency: Timothy [name] Arranged: OT, PT [name] Agency: [facility] Date [name] Agency Contacted: 04/20/24 Time [name] Agency Contacted: [phone] Representative spoke with at [facility] [name] Agency: Timothy [name]  Prior Living Arrangements/Services Living arrangements for the past 2 months: Single Family Home Lives with:: Significant Other Patient language and need for interpreter reviewed:: Yes Do you feel safe going back to the place where you live?: Yes       Need for Family Participation in Patient Care: Yes (Comment) Care giver support system in place?: Yes (comment) Current home services: DME (home 02) Criminal Activity/Legal Involvement Pertinent to Current Situation/Hospitalization: No - Comment as needed  Activities of Daily Living   ADL Screening (condition at time of admission) Independently performs ADLs?: Yes (appropriate for developmental age) Is the patient deaf or have difficulty hearing?: No Does the patient have difficulty seeing, even when wearing glasses/contacts?: No Does the patient have difficulty concentrating, remembering, or making decisions?: No  Permission Sought/Granted Permission sought to share information with : Photographer granted to share info w AGENCY: [name], DME        Emotional Assessment Appearance:: Appears stated age Attitude/Demeanor/Rapport: Gracious Affect (typically observed): Accepting Orientation: : Oriented to Self, Oriented to Place, Oriented to  Time, Oriented to Situation Alcohol / Substance Use: Not Applicable Psych Involvement: No (comment)  Admission diagnosis:  COPD exacerbation (HCC) [J44.1] COPD with acute exacerbation (HCC) [J44.1] COPD mixed type (HCC) [J44.9] Essential hypertension [I10] S/P right coronary artery (RCA) stent placement [Z95.5] Acute on chronic respiratory failure, unspecified whether with hypoxia or hypercapnia (HCC) [J96.20] Coronary artery disease of native artery of native heart with stable angina pectoris [I25.118] Patient Active Problem List   Diagnosis Date Noted   S/P right coronary artery (RCA) stent placement 04/19/2024   Acute cystitis without hematuria 04/19/2024   History of bladder cancer 04/19/2024   Coronary artery disease of  native artery of native heart with stable angina pectoris 04/19/2024   COPD with acute exacerbation (HCC) 04/18/2024   Colon cancer screening 11/12/2022   Positive colorectal cancer  screening using Cologuard test 11/12/2022   Benign neoplasm of ascending colon 11/12/2022   Influenza A 04/09/2021   Acute on chronic respiratory failure (HCC) 04/09/2021   COPD exacerbation (HCC) 04/03/2021   COPD (chronic obstructive pulmonary disease) (HCC) 04/02/2021   Exercise hypoxemia 07/02/2020   Acute STEMI of inferior wall  02/24/2020   Lung nodule 11/20/2016   Gastroesophageal reflux disease without esophagitis 11/20/2016   Prediabetes 12/29/2013   Dental cavities 12/29/2013   DOE (dyspnea on exertion) 08/30/2013   Essential hypertension 08/03/2013   CAD S/P percutaneous coronary angioplasty 06/19/2013   Mixed hyperlipidemia 06/19/2013   Former cigarette smoker 06/19/2013   NSTEMI (non-ST elevated myocardial infarction), DES to RCA 06/13/2013   COPD GOLD 3 06/12/2013   PCP:  [name], MD Pharmacy:   [facility] [phone] [address], [name] - [address] AT [facility] [address] [place] [address] Phone: [phone] Fax: [phone]     Social Drivers of Health (SDOH) Social History: SDOH Screenings   Food Insecurity: No Food Insecurity (04/19/2024)  Housing: Low Risk (04/19/2024)  Transportation Needs: No Transportation Needs (04/19/2024)  Utilities: Not At Risk (04/19/2024)  Alcohol Screen: Low Risk (06/29/2023)  Depression (PHQ2-9): Low Risk (02/25/2024)  Financial Resource Strain: Medium Risk (02/25/2023)  Physical Activity: Inactive (06/29/2023)  Social Connections: Moderately Isolated (04/19/2024)  Stress: No Stress Concern Present (06/29/2023)  Tobacco Use: Medium Risk (04/19/2024)  Health Literacy: Adequate Health Literacy (06/29/2023)   SDOH Interventions:     Readmission Risk Interventions     No data to display

## 2024-04-20 NOTE — Progress Notes (Addendum)
 This pt had BM consistent like #6 and not much, stool collected was sent to lab but was rejected as it is not enough. Will pass on day shift for collection.  Order for GI panel is placed. Plan of care ongoing.

## 2024-04-21 ENCOUNTER — Inpatient Hospital Stay (HOSPITAL_COMMUNITY)

## 2024-04-21 DIAGNOSIS — J9621 Acute and chronic respiratory failure with hypoxia: Secondary | ICD-10-CM | POA: Diagnosis not present

## 2024-04-21 DIAGNOSIS — Z8551 Personal history of malignant neoplasm of bladder: Secondary | ICD-10-CM | POA: Diagnosis not present

## 2024-04-21 DIAGNOSIS — I25118 Atherosclerotic heart disease of native coronary artery with other forms of angina pectoris: Secondary | ICD-10-CM | POA: Diagnosis not present

## 2024-04-21 DIAGNOSIS — J441 Chronic obstructive pulmonary disease with (acute) exacerbation: Secondary | ICD-10-CM | POA: Diagnosis not present

## 2024-04-21 LAB — CBC WITH DIFFERENTIAL/PLATELET
Abs Immature Granulocytes: 0.11 K/uL — ABNORMAL HIGH (ref 0.00–0.07)
Basophils Absolute: 0 K/uL (ref 0.0–0.1)
Basophils Relative: 0 %
Eosinophils Absolute: 0 K/uL (ref 0.0–0.5)
Eosinophils Relative: 0 %
HCT: 44.7 % (ref 39.0–52.0)
Hemoglobin: 14.9 g/dL (ref 13.0–17.0)
Immature Granulocytes: 1 %
Lymphocytes Relative: 9 %
Lymphs Abs: 1.7 K/uL (ref 0.7–4.0)
MCH: 30.3 pg (ref 26.0–34.0)
MCHC: 33.3 g/dL (ref 30.0–36.0)
MCV: 91 fL (ref 80.0–100.0)
Monocytes Absolute: 1.9 K/uL — ABNORMAL HIGH (ref 0.1–1.0)
Monocytes Relative: 10 %
Neutro Abs: 15.1 K/uL — ABNORMAL HIGH (ref 1.7–7.7)
Neutrophils Relative %: 80 %
Platelets: 350 K/uL (ref 150–400)
RBC: 4.91 MIL/uL (ref 4.22–5.81)
RDW: 12.7 % (ref 11.5–15.5)
WBC: 18.9 K/uL — ABNORMAL HIGH (ref 4.0–10.5)
nRBC: 0 % (ref 0.0–0.2)

## 2024-04-21 LAB — COMPREHENSIVE METABOLIC PANEL WITH GFR
ALT: 22 U/L (ref 0–44)
AST: 26 U/L (ref 15–41)
Albumin: 3.9 g/dL (ref 3.5–5.0)
Alkaline Phosphatase: 132 U/L — ABNORMAL HIGH (ref 38–126)
Anion gap: 10 (ref 5–15)
BUN: 16 mg/dL (ref 8–23)
CO2: 30 mmol/L (ref 22–32)
Calcium: 9.3 mg/dL (ref 8.9–10.3)
Chloride: 101 mmol/L (ref 98–111)
Creatinine, Ser: 0.52 mg/dL — ABNORMAL LOW (ref 0.61–1.24)
GFR, Estimated: >60 mL/min
Glucose, Bld: 109 mg/dL — ABNORMAL HIGH (ref 70–99)
Potassium: 4 mmol/L (ref 3.5–5.1)
Sodium: 141 mmol/L (ref 135–145)
Total Bilirubin: 0.6 mg/dL (ref 0.0–1.2)
Total Protein: 6.7 g/dL (ref 6.5–8.1)

## 2024-04-21 LAB — MAGNESIUM: Magnesium: 2.6 mg/dL — ABNORMAL HIGH (ref 1.7–2.4)

## 2024-04-21 LAB — BLOOD GAS, VENOUS
Acid-Base Excess: 9.7 mmol/L — ABNORMAL HIGH (ref 0.0–2.0)
Bicarbonate: 35.4 mmol/L — ABNORMAL HIGH (ref 20.0–28.0)
Drawn by: 8050
O2 Saturation: 98.1 %
Patient temperature: 36.8
pCO2, Ven: 51 mmHg (ref 44–60)
pH, Ven: 7.45 — ABNORMAL HIGH (ref 7.25–7.43)
pO2, Ven: 85 mmHg — ABNORMAL HIGH (ref 32–45)

## 2024-04-21 MED ORDER — SALINE SPRAY 0.65 % NA SOLN
1.0000 | NASAL | Status: DC | PRN
  Filled 2024-04-21: qty 44

## 2024-04-21 MED ORDER — METHYLPREDNISOLONE SODIUM SUCC 40 MG IJ SOLR
20.0000 mg | Freq: Once | INTRAMUSCULAR | Status: AC
  Administered 2024-04-22: 20 mg via INTRAVENOUS
  Filled 2024-04-21: qty 1

## 2024-04-21 MED ORDER — LORAZEPAM 2 MG/ML IJ SOLN
0.5000 mg | Freq: Four times a day (QID) | INTRAMUSCULAR | Status: DC | PRN
  Administered 2024-04-21 – 2024-04-25 (×6): 0.5 mg via INTRAVENOUS
  Filled 2024-04-21 (×7): qty 1

## 2024-04-21 MED ORDER — LORAZEPAM 2 MG/ML IJ SOLN
0.5000 mg | Freq: Once | INTRAMUSCULAR | Status: AC
  Administered 2024-04-21: 0.5 mg via INTRAVENOUS

## 2024-04-21 MED ORDER — GUAIFENESIN ER 600 MG PO TB12
1200.0000 mg | ORAL_TABLET | Freq: Two times a day (BID) | ORAL | Status: DC | PRN
  Administered 2024-04-21: 1200 mg via ORAL
  Filled 2024-04-21: qty 2

## 2024-04-21 MED ORDER — METHYLPREDNISOLONE SODIUM SUCC 125 MG IJ SOLR
80.0000 mg | Freq: Two times a day (BID) | INTRAMUSCULAR | Status: DC
  Administered 2024-04-22: 80 mg via INTRAVENOUS
  Filled 2024-04-21: qty 2

## 2024-04-21 NOTE — Progress Notes (Signed)
 " Progress Note   Patient: Timothy [name] FMW:[id number] DOB: 07-22-1955 DOA: 04/18/2024     3 DOS: the patient was seen and examined on 04/21/2024   Brief hospital course: Timothy [name] is a 68 y.o. male with medical history significant for HTN, cardiac arrest vfib/vtach 06/2013, NSTEMI/CAD with stent to RCA, COPD Gold stage III, chronic hypoxic respiratory failure on 2 L at night and as needed, former smoker, prediabetes, pulmonary nodule, GERD, HLD, Elev PSA (neg prostate bx 07/2022) and bladder cancer who presented via EMS for evaluation of shortness of breath, nausea vomiting and diarrhea.  Patient is admitted to hospitalist service for acute on chronic hypoxic respiratory failure, COPD exacerbation.  Assessment and Plan: Acute on chronic hypoxic respiratory failure COPD exacerbation- Patient had worse shortness of breath, wheezing and productive cough, hypoxia requiring 7 L supplemental oxygen upon presentation. He was placed on bipap 12/18 night, got ativan for anxiety. Continue IV Solu-Medrol  60mg  q 12hr. Continue DuoNebs Q4 hourly, azithromycin . Respiratory viral panel negative. Continue home inhalers, antitussive medication. Continue to wean supplemental oxygen to baseline 2 L. Encourage incentive spirometry, flutter valve.  Abnormal urinalysis- Urine cultures insignificant growth. Stop Rocephin  therapy.  Acute diarrhea, nausea, vomiting- C. difficile studies negative. Diarrhea better. Eating fair. Continue antiemetics, stop IV fluids.  Hypertension: Continue home dose amlodipine , bisoprolol .  CAD status post RCA stent: Continue home dose Brilinta , statin.  History of bladder cancer status post TURP: Urology follow-up as outpatient.  GERD on Protonix .    Out of bed to chair. Incentive spirometry. Nursing supportive care. Fall, aspiration precautions. Diet:  Diet Orders (From admission, onward)     Start     Ordered   04/18/24 2251  Diet regular Room service  appropriate? Yes; Fluid consistency: Thin  Diet effective now       Question Answer Comment  Room service appropriate? Yes   Fluid consistency: Thin      04/18/24 2255           DVT prophylaxis: enoxaparin  (LOVENOX ) injection 40 mg Start: 04/19/24 1000  Level of care: Progressive   Code Status: Full Code  Subjective: Patient is seen and examined today morning. He felt terrible last night, placed on bipap. Feels improved, sitting currently on 6L supplemental o2.  Physical Exam: Vitals:   04/21/24 0342 04/21/24 0610 04/21/24 0911 04/21/24 1128  BP: 120/81  120/81 132/71  Pulse: 96   91  Resp: 19   19  Temp: 97.6 F (36.4 C)   (!) 97.4 F (36.3 C)  TempSrc: Axillary   Oral  SpO2: 97% 97%  97%  Weight:      Height:        General - Elderly Caucasian thin built male, moderate respiratory distress HEENT - PERRLA, EOMI, atraumatic head, non tender sinuses. Lung - decreased air entry, diffuse rhonchi, wheezes. Using accessory muscles. Heart - S1, S2 heard, no murmurs, rubs, no pedal edema. Abdomen - Soft, non tender, distended, bowel sounds good Neuro - Alert, awake and oriented x 3, non focal exam. Skin - Warm and dry.  Data Reviewed:      Latest Ref Rng & Units 04/21/2024    3:11 AM 04/19/2024    5:18 AM 04/18/2024    7:43 PM  CBC  WBC 4.0 - 10.5 K/uL 18.9  9.2    Hemoglobin 13.0 - 17.0 g/dL 85.0  84.4  81.2   Hematocrit 39.0 - 52.0 % 44.7  45.9  55.0   Platelets 150 - 400  K/uL 350  346        Latest Ref Rng & Units 04/21/2024    3:11 AM 04/19/2024    5:18 AM 04/18/2024    7:43 PM  BMP  Glucose 70 - 99 mg/dL 890  830  805   BUN 8 - 23 mg/dL 16  10  12    Creatinine 0.61 - 1.24 mg/dL 9.47  9.28  9.09   Sodium 135 - 145 mmol/L 141  139  140   Potassium 3.5 - 5.1 mmol/L 4.0  3.7  3.9   Chloride 98 - 111 mmol/L 101  101  98   CO2 22 - 32 mmol/L 30  25    Calcium  8.9 - 10.3 mg/dL 9.3  9.1     No results found.   Family Communication: Discussed with  patient, he understand and agree. All questions answered.  Disposition: Status is: Inpatient Remains inpatient appropriate because: hypoxia, steroids, abx, wean o2, PT/ OT  Planned Discharge Destination: Home     Time spent: 51 minutes  Author: Concepcion Riser, MD 04/21/2024 2:04 PM Secure chat 7am to 7pm For on call review [email or web address].    "

## 2024-04-21 NOTE — Progress Notes (Signed)
 [facility] night cross cover note:   I was notified by the patient's RN  ***   Resuming bipap at this time Vbg, cxr, procalcitonin level, proBNP ordered to be checked now.   Additionally, noted morning labs, including CMP, CBC, magnesium  and phosphorus levels.    In the setting of increasing supplemental oxygen requirements and context of acute COPD exacerbation, I have increased the dose of his solumedrol from 60 mg IV twice daily to 80 mg IV twice daily.  Levels ordered solumedrol 20 mg IV x 1 dose this evening, as the patient has already received his evening dose of 60 mg of IV Solu-Medrol .    Eva Pore, DO Hospitalist

## 2024-04-21 NOTE — Plan of Care (Signed)
  Problem: Education: Goal: Knowledge of General Education information will improve Description: Including pain rating scale, medication(s)/side effects and non-pharmacologic comfort measures Outcome: Progressing   Problem: Activity: Goal: Risk for activity intolerance will decrease Outcome: Progressing   Problem: Coping: Goal: Level of anxiety will decrease Outcome: Progressing   Problem: Education: Goal: Knowledge of disease or condition will improve Outcome: Progressing

## 2024-04-21 NOTE — Progress Notes (Signed)
 Patient asking for inhaler at beginning of shift. Using accessory muscles and seems anxious. Inhaler given, breathing treatment being administered and Ativan  given. He is amendable to going back on bi-pap for a bit if this does not improve.  He also is anxious about his left nostril being clogged. Patient does state he is congested and at home he uses Mucinex  for this and it does help.   Paged Dr. Marcene for possible order for [facility] spray nasal spray or similar along with order for Mucinex .

## 2024-04-21 NOTE — Progress Notes (Signed)
 [facility] night cross cover note:   I have added prn ocean nasal spray for nasal congestion.  The patient also reports that he uses guaifenesin  on a as needed basis for congestion at home.  At this time I have also ordered prn guaifenesin  for him.    Eva Pore, DO Hospitalist

## 2024-04-21 NOTE — Progress Notes (Signed)
 Physical Therapy Treatment Patient Details Name: Timothy [name] MRN: [id number] DOB: 06-Oct-1955 Today's Date: 04/21/2024   History of Present Illness Pt is a 68 y.o male admitted 12/16 for N/V/D. Hypertensive in ED, admitted for COPD exacerbation. PMH: COPD, HTN, cardiac arrest, NSTEMI, CAD, HLD    PT Comments  Pt received in supine and agreeable to limited PT session. Pt reported having difficulty breathing last night with fear of further exacerbation. Pt was agreeable to education on use of rollator with trial. Pt was slightly impulsive with moving quickly with no warning. Discussed taking rest breaks in between movements with pt verbalizing understanding. Able to ambulate a few feet with use of rollator with cueing for technique when turning. Discussed continuing to mobilize and get up to the recliner for meals. Continue to recommend home with HHPT with acute PT to follow.    93-95% SpO2 on 6L 110-120 BPM during mobility   If plan is discharge home, recommend the following: A little help with bathing/dressing/bathroom;Assistance with cooking/housework;Assist for transportation   Can travel by private vehicle      Yes  Equipment Recommendations  Rollator (4 wheels);Other (comment) (tub bench/shower seat)       Precautions / Restrictions Precautions Precautions: Fall;Other (comment) Recall of Precautions/Restrictions: Intact Precaution/Restrictions Comments: watch SpO2 (on 2-3L O2 PRN at baseline) Restrictions Weight Bearing Restrictions Per Provider Order: No     Mobility  Bed Mobility Overal bed mobility: Needs Assistance Bed Mobility: Supine to Sit    Supine to sit: HOB elevated, Supervision    General bed mobility comments: for safety    Transfers Overall transfer level: Needs assistance Equipment used: Rollator (4 wheels), None Transfers: Sit to/from Stand, Bed to chair/wheelchair/BSC Sit to Stand: Contact guard assist   Step pivot transfers: Contact guard  assist    General transfer comment: slightly unsteady with no AD, however, likely due to pt jumping up quickly. Cues for technique with rollator with pt having difficulty remembering to lock brakes prior to sitting    Ambulation/Gait Ambulation/Gait assistance: Contact guard assist Gait Distance (Feet): 4 Feet (x2) Assistive device: Rollator (4 wheels) Gait Pattern/deviations: Step-through pattern, Decreased step length - right, Decreased step length - left, Decreased stride length Gait velocity: reduced     General Gait Details: Limited by DOE and fear of having difficulty breathing. Steady with use of rollator with cues to push into a wall prior to turning around and sitting.      Balance Overall balance assessment: Needs assistance Sitting-balance support: No upper extremity supported, Feet supported Sitting balance-Leahy Scale: Good     Standing balance support: Bilateral upper extremity supported, Reliant on assistive device for balance, During functional activity Standing balance-Leahy Scale: Poor     Communication Communication Communication: No apparent difficulties  Cognition Arousal: Alert Behavior During Therapy: WFL for tasks assessed/performed   PT - Cognitive impairments: Safety/Judgement    PT - Cognition Comments: slightly impulsive with attempting to move quickly. After discussion, pt was able to verbalize why he needed to rest in between movements Following commands: Intact      Cueing Cueing Techniques: Verbal cues    04/21/24 0824  General Exercises - Lower Extremity  Long Arc Quad AROM;Both;Seated (3 reps, 10 sec hold)         Pertinent Vitals/Pain Pain Assessment Pain Assessment: Faces Faces Pain Scale: Hurts little more Pain Location: discomfort with SOB Pain Descriptors / Indicators: Discomfort Pain Intervention(s): Limited activity within patient's tolerance, Monitored during session, Repositioned  PT Goals (current goals can now be  found in the care plan section) Acute Rehab PT Goals Patient Stated Goal: to be able to breathe better PT Goal Formulation: With patient Time For Goal Achievement: 05/04/24 Potential to Achieve Goals: Good Progress towards PT goals: Progressing toward goals    Frequency    Min 2X/week       AM-PAC PT 6 Clicks Mobility   Outcome Measure  Help needed turning from your back to your side while in a flat bed without using bedrails?: A Little Help needed moving from lying on your back to sitting on the side of a flat bed without using bedrails?: A Little Help needed moving to and from a bed to a chair (including a wheelchair)?: A Little Help needed standing up from a chair using your arms (e.g., wheelchair or bedside chair)?: A Little Help needed to walk in hospital room?: A Little Help needed climbing 3-5 steps with a railing? : A Little 6 Click Score: 18    End of Session Equipment Utilized During Treatment: Oxygen Activity Tolerance: Patient tolerated treatment well Patient left: in chair;with call bell/phone within reach;with chair alarm set Nurse Communication: Mobility status PT Visit Diagnosis: Unsteadiness on feet (R26.81);Other abnormalities of gait and mobility (R26.89);Muscle weakness (generalized) (M62.81);Difficulty in walking, not elsewhere classified (R26.2)     Time: 0802-0820 PT Time Calculation (min) (ACUTE ONLY): 18 min  Charges:    $Gait Training: 8-22 mins PT General Charges $$ ACUTE PT VISIT: 1 Visit                    Kate ORN, PT, DPT Secure Chat Preferred  Rehab Office [phone]   Kate BRAVO Wendolyn 04/21/2024, 8:28 AM

## 2024-04-21 NOTE — Care Management Important Message (Signed)
 Important Message  Patient Details  Name: Timothy [name] MRN: [id number] Date of Birth: April 02, 1956   Important Message Given:  Yes - Medicare IM     Claretta Deed 04/21/2024, 1:49 PM

## 2024-04-21 NOTE — Progress Notes (Signed)
 Approximately 0030, patient awoke and was having trouble breathing. He could not move any air. Patient using accessory muscles to breath, sweating, and tachypneic. Lungs dim.  RT called. NRB applied. Patient unable to tolerate mask. High flow O2 applied at 13 L per min. Saturations increased; however, patient did not get any relief in distress.  RT to room and applied Bipap. MD notified. Ativan one time given. Patient slightly improved. RR under 20. Dr. Marcene will be to bedside shortly.

## 2024-04-21 NOTE — Progress Notes (Signed)
 [facility] night cross cover note:   I was notified by the patient's RN that the patient has woken up from sleep feeling short of breath, currently on BiPAP.   Appropriate chart review, he is here with acute hypoxic hypercapnic respiratory failure in setting of acute COPD exacerbation.  He has orders for scheduled and as needed breathing treatments as well as IV Solu-Medrol .  He also reports feeling anxious while wearing the BiPAP mask.  No evidence of desaturation on BiPAP.  I ordered as needed IV Ativan  for anxiety for him and ordered stat updated labs to include CMP, CBC, magnesium  level and VBG.  I subsequently evaluated the patient at bedside, with the patient conveying that he feels much more comfortable following the Ativan .    Eva Pore, DO Hospitalist

## 2024-04-21 NOTE — Plan of Care (Signed)
" °  Problem: Education: Goal: Knowledge of General Education information will improve Description: Including pain rating scale, medication(s)/side effects and non-pharmacologic comfort measures Outcome: Progressing   Problem: Clinical Measurements: Goal: Will remain free from infection Outcome: Progressing Goal: Respiratory complications will improve Outcome: Not Progressing   Problem: Activity: Goal: Risk for activity intolerance will decrease Outcome: Progressing   Problem: Coping: Goal: Level of anxiety will decrease Outcome: Progressing   "

## 2024-04-22 ENCOUNTER — Inpatient Hospital Stay (HOSPITAL_COMMUNITY)

## 2024-04-22 DIAGNOSIS — J441 Chronic obstructive pulmonary disease with (acute) exacerbation: Secondary | ICD-10-CM | POA: Diagnosis not present

## 2024-04-22 DIAGNOSIS — J439 Emphysema, unspecified: Secondary | ICD-10-CM

## 2024-04-22 DIAGNOSIS — E782 Mixed hyperlipidemia: Secondary | ICD-10-CM | POA: Diagnosis not present

## 2024-04-22 DIAGNOSIS — I25118 Atherosclerotic heart disease of native coronary artery with other forms of angina pectoris: Secondary | ICD-10-CM | POA: Diagnosis not present

## 2024-04-22 DIAGNOSIS — Z955 Presence of coronary angioplasty implant and graft: Secondary | ICD-10-CM | POA: Diagnosis not present

## 2024-04-22 DIAGNOSIS — J9612 Chronic respiratory failure with hypercapnia: Secondary | ICD-10-CM

## 2024-04-22 DIAGNOSIS — J9621 Acute and chronic respiratory failure with hypoxia: Secondary | ICD-10-CM | POA: Diagnosis not present

## 2024-04-22 DIAGNOSIS — Z8551 Personal history of malignant neoplasm of bladder: Secondary | ICD-10-CM | POA: Diagnosis not present

## 2024-04-22 LAB — COMPREHENSIVE METABOLIC PANEL WITH GFR
ALT: 29 U/L (ref 0–44)
AST: 21 U/L (ref 15–41)
Albumin: 3.7 g/dL (ref 3.5–5.0)
Alkaline Phosphatase: 88 U/L (ref 38–126)
Anion gap: 8 (ref 5–15)
BUN: 18 mg/dL (ref 8–23)
CO2: 31 mmol/L (ref 22–32)
Calcium: 9 mg/dL (ref 8.9–10.3)
Chloride: 99 mmol/L (ref 98–111)
Creatinine, Ser: 0.54 mg/dL — ABNORMAL LOW (ref 0.61–1.24)
GFR, Estimated: >60 mL/min
Glucose, Bld: 135 mg/dL — ABNORMAL HIGH (ref 70–99)
Potassium: 4.7 mmol/L (ref 3.5–5.1)
Sodium: 138 mmol/L (ref 135–145)
Total Bilirubin: 0.5 mg/dL (ref 0.0–1.2)
Total Protein: 6.2 g/dL — ABNORMAL LOW (ref 6.5–8.1)

## 2024-04-22 LAB — CBC WITH DIFFERENTIAL/PLATELET
Abs Immature Granulocytes: 0.16 K/uL — ABNORMAL HIGH (ref 0.00–0.07)
Basophils Absolute: 0.1 K/uL (ref 0.0–0.1)
Basophils Relative: 0 %
Eosinophils Absolute: 0 K/uL (ref 0.0–0.5)
Eosinophils Relative: 0 %
HCT: 42.4 % (ref 39.0–52.0)
Hemoglobin: 14.1 g/dL (ref 13.0–17.0)
Immature Granulocytes: 1 %
Lymphocytes Relative: 8 %
Lymphs Abs: 1 K/uL (ref 0.7–4.0)
MCH: 30.2 pg (ref 26.0–34.0)
MCHC: 33.3 g/dL (ref 30.0–36.0)
MCV: 90.8 fL (ref 80.0–100.0)
Monocytes Absolute: 0.4 K/uL (ref 0.1–1.0)
Monocytes Relative: 3 %
Neutro Abs: 11.7 K/uL — ABNORMAL HIGH (ref 1.7–7.7)
Neutrophils Relative %: 88 %
Platelets: 305 K/uL (ref 150–400)
RBC: 4.67 MIL/uL (ref 4.22–5.81)
RDW: 12.6 % (ref 11.5–15.5)
Smear Review: NORMAL
WBC: 13.3 K/uL — ABNORMAL HIGH (ref 4.0–10.5)
nRBC: 0 % (ref 0.0–0.2)

## 2024-04-22 LAB — BLOOD GAS, ARTERIAL
Acid-Base Excess: 8.3 mmol/L — ABNORMAL HIGH (ref 0.0–2.0)
Bicarbonate: 36.4 mmol/L — ABNORMAL HIGH (ref 20.0–28.0)
O2 Saturation: 99.5 %
Patient temperature: 36.4
pCO2 arterial: 64 mmHg — ABNORMAL HIGH (ref 32–48)
pH, Arterial: 7.36 (ref 7.35–7.45)
pO2, Arterial: 223 mmHg — ABNORMAL HIGH (ref 83–108)

## 2024-04-22 LAB — PROCALCITONIN: Procalcitonin: <0.1 ng/mL

## 2024-04-22 LAB — BLOOD GAS, VENOUS
Acid-Base Excess: 9.4 mmol/L — ABNORMAL HIGH (ref 0.0–2.0)
Bicarbonate: 36.1 mmol/L — ABNORMAL HIGH (ref 20.0–28.0)
O2 Saturation: 99.1 %
Patient temperature: 36.5
pCO2, Ven: 56 mmHg (ref 44–60)
pH, Ven: 7.42 (ref 7.25–7.43)
pO2, Ven: 104 mmHg — ABNORMAL HIGH (ref 32–45)

## 2024-04-22 LAB — GLUCOSE, CAPILLARY: Glucose-Capillary: 136 mg/dL — ABNORMAL HIGH (ref 70–99)

## 2024-04-22 LAB — PHOSPHORUS: Phosphorus: 2.7 mg/dL (ref 2.5–4.6)

## 2024-04-22 LAB — MAGNESIUM: Magnesium: 2.4 mg/dL (ref 1.7–2.4)

## 2024-04-22 LAB — D-DIMER, QUANTITATIVE: D-Dimer, Quant: 0.31 ug{FEU}/mL (ref 0.00–0.50)

## 2024-04-22 LAB — PRO BRAIN NATRIURETIC PEPTIDE: Pro Brain Natriuretic Peptide: 133 pg/mL

## 2024-04-22 MED ORDER — ARFORMOTEROL TARTRATE 15 MCG/2ML IN NEBU
15.0000 ug | INHALATION_SOLUTION | Freq: Two times a day (BID) | RESPIRATORY_TRACT | Status: DC
  Administered 2024-04-22 – 2024-04-23 (×4): 15 ug via RESPIRATORY_TRACT
  Filled 2024-04-22 (×5): qty 2

## 2024-04-22 MED ORDER — METHYLPREDNISOLONE SODIUM SUCC 125 MG IJ SOLR
60.0000 mg | INTRAMUSCULAR | Status: AC
  Administered 2024-04-23: 60 mg via INTRAVENOUS
  Filled 2024-04-22: qty 2

## 2024-04-22 MED ORDER — IPRATROPIUM-ALBUTEROL 0.5-2.5 (3) MG/3ML IN SOLN
3.0000 mL | Freq: Four times a day (QID) | RESPIRATORY_TRACT | Status: DC
  Administered 2024-04-22 – 2024-04-23 (×3): 3 mL via RESPIRATORY_TRACT
  Filled 2024-04-22 (×3): qty 3

## 2024-04-22 MED ORDER — BUDESONIDE 0.25 MG/2ML IN SUSP
0.2500 mg | Freq: Two times a day (BID) | RESPIRATORY_TRACT | Status: DC
  Administered 2024-04-22 – 2024-04-23 (×4): 0.25 mg via RESPIRATORY_TRACT
  Filled 2024-04-22 (×5): qty 2

## 2024-04-22 NOTE — Significant Event (Signed)
 Rapid Response Event Note   Reason for Call :  Increased oxygen demand, increased WOB, confusion  PCXR done at 2342 and VBG done at 0445: PCXR-hyperinflation with emphysema. Air filled structure in the epigastric area, presumably air distended stomach or bowel but rec. ABD xray to further assess.  VBG-7.42/56/104/36.1  Initial Focused Assessment:  Pt sitting up in bed with eyes open. His breathing is tachypneic and labored. He know who he is but is confused about everything else. He does follow commands and  MAE. Lungs are wheezy t/o. Skin warm/dry.   T-97.6, HR-111, BP-137/89, RR-30s, SpO2-97% on 8L HFNC (salter)  Interventions:  Duoneb ABG D-dimer ABD xray U/A Pt placed back on bipap Plan of Care:  ?Delirium? Continue on bipap. Await ABG and xray results. Please call RRT if further assistance needed.   Event Summary:   MD Notified: Dr. Marcene notified by bedside RN Call [phone] Arrival Time:0550 End Upfz:9362  Tish Graeme Piety, RN

## 2024-04-22 NOTE — Consult Note (Signed)
 "  NAME:  Timothy [name], MRN:  [id number], DOB:  08/22/1955, LOS: 4 ADMISSION DATE:  04/18/2024, CONSULTATION DATE:  04/22/24 REFERRING MD:  Concepcion Riser MD CHIEF COMPLAINT:  COPD exacerbation   History of Present Illness:  68 year old male former smoker (quit in 2010) with pertinent medical history of COPD, nocturnal respiratory failure on 2L who presented for SOB, wheezing, N/V/D and admitted on 04/18/24 for COPD exacerbation with increased O2 requirement to 7L and requiring BiPAP. CXR neg for infiltrate or effusion or edema. Started on rocephin , azithmycin, steroids, mag and nebulizers. Neg for covid, RSV and flu. C.diff negative. Initial O2 requirement improved to 4L however overnight on 12/19 had worsening shortness of breath and hypoxemia and given ativan  for BiPAP tolerance. Continued to have increased WOB that same evening and requiring BiPAP again. Solumedrol was repeated and repeat CXR with no acute changes. VBG 7.42/56/104. Patient had some confusion and persistent wheezing that was treated with Duoneb. Early in the morning of 12/20 patient in respiratory distress and confused after found with BiPAP mask removed by patient. Repeat ABG 7.36/64/223/36. D dimer neg. PCCM consulted for recommendations.  Prior studies Followed by Dr. Darlean at [facility]. On Breztri . Unable to obtain ohtuvayre  due to deductible. Last visit in 11/2023  PFT 07/01/20 FVC 2.80 (72%) FEV1 1.35 (46%) Ratio 48  TLC 108% RV 174% DLCO 40% Interpretation: Severe obstructive defect with air trapping and moderately reduced gas exchange consistent with emphysema  CT Chest lung scree 06/15/23 Bullous emphysema, subcentimeter stable calcified and noncalcified pulmonary nodules  Pertinent  Medical History  Emphysema, COPD, chronic respiratory failure on 2L, CAD, hx cardiac arrest 2015 s/p PCI to RCA, HTN, HLD, bladder cancer s/p TURB, GERD  Significant Hospital Events: Including procedures, antibiotic start and  stop dates in addition to other pertinent events     Interim History / Subjective:  As above  Objective    Blood pressure 122/76, pulse 95, temperature 97.7 F (36.5 C), temperature source Oral, resp. rate 20, height 5' 6 (1.676 m), weight 54.8 kg, SpO2 94%.    Vent Mode: BIPAP;PSV FiO2 (%):  [40 %-44 %] 40 % PEEP:  [5 cmH20] 5 cmH20 Pressure Support:  [10 cmH20] 10 cmH20   Intake/Output Summary (Last 24 hours) at 04/22/2024 1204 Last data filed at 04/22/2024 0656 Gross per 24 hour  Intake --  Output 300 ml  Net -300 ml   Filed Weights   04/18/24 1919 04/19/24 1112  Weight: 56.7 kg 54.8 kg   Physical Exam: General: Chronically ill-appearing, no acute distress HENT: [place], AT, OP clear, MMM Eyes: EOMI, no scleral icterus Respiratory: Diminished to auscultation bilaterally.  No crackles, wheezing or rales Cardiovascular: RRR, -M/R/G, no JVD GI: BS+, distended, soft, nontender Extremities:-Edema,-tenderness Neuro: AAO x4, CNII-XII grossly intact Psych: Normal mood, normal affect  CXR 04/21/24 - Hyperinflation. No infiltrate effusion or edema WBC improving 13.3 CO2 31 ABG and VBG with compensated chronic hypercarbic respiratory failure  Assessment and Plan   Severe bullous COPD with emphysema COPD exacerbation Acute on chronic hypoxemic respiratory failure Chronic hypercarbic respiratory failure Given the severity of his chronic lung disease and presumed progression from severe status on 2022 PFTs, he is likely slow to improve and will require nocturnal ventilation for respiratory support.  --Change Breztri  to scheduled ICS/LABA/LAMA neb --Reduce solumedrol to 60 mg daily. Addition steroid can contribute to anxiety and agitation --Counseled patient regarding pulmonary regimen. He expressed distress with what he perceived as chaos in his room  every night.  --Discussed GOC and he does not want to be on mechanical ventilation unless absolutely necessary and reports  current BiPAP mask is comfortable and willing to try to keep it on while he sleeps --Addressed questions regarding the clinical course of his COPD and likely progression of his symptoms. On triple therapy and oxygen as an outpatient. Would consider adding home nocturnal ventilation for support for his hypercarbic respiratory failure. Ohtuvayre  was previously considered however financial limitations prevented him from obtaining but due to his bullous emphysema, this may not be an effective option for treatment. Will consult palliative but may benefit from low dose systemic opioids for refractory breathlessness in the future  Pulmonary will continue to follow  Labs   CBC: Recent Labs  Lab 04/18/24 1940 04/18/24 1943 04/19/24 0518 04/21/24 0311 04/22/24 0447  WBC 22.3*  --  9.2 18.9* 13.3*  NEUTROABS 17.3*  --   --  15.1* 11.7*  HGB 17.7* 18.7* 15.5 14.9 14.1  HCT 52.9* 55.0* 45.9 44.7 42.4  MCV 91.0  --  90.2 91.0 90.8  PLT 406*  --  346 350 305    Basic Metabolic Panel: Recent Labs  Lab 04/18/24 1940 04/18/24 1943 04/19/24 0518 04/21/24 0311 04/22/24 0447  NA 141 140 139 141 138  K 3.9 3.9 3.7 4.0 4.7  CL 98 98 101 101 99  CO2 28  --  25 30 31   GLUCOSE 190* 194* 169* 109* 135*  BUN 11 12 10 16 18   CREATININE 0.80 0.90 0.71 0.52* 0.54*  CALCIUM  9.8  --  9.1 9.3 9.0  MG  --   --   --  2.6* 2.4  PHOS  --   --   --   --  2.7   GFR: Estimated Creatinine Clearance: 69.5 mL/min (A) (by C-G formula based on SCr of 0.54 mg/dL (L)). Recent Labs  Lab 04/18/24 1940 04/18/24 1943 04/18/24 2224 04/19/24 0518 04/21/24 0311 04/22/24 0447  PROCALCITON  --   --   --   --   --  <0.10  WBC 22.3*  --   --  9.2 18.9* 13.3*  LATICACIDVEN  --  2.3* 1.7  --   --   --     Liver Function Tests: Recent Labs  Lab 04/18/24 1940 04/21/24 0311 04/22/24 0447  AST 27 26 21   ALT 28 22 29   ALKPHOS 115 132* 88  BILITOT 0.5 0.6 0.5  PROT 8.2* 6.7 6.2*  ALBUMIN 4.5 3.9 3.7   No results  for input(s): LIPASE, AMYLASE in the last 168 hours. No results for input(s): AMMONIA in the last 168 hours.  ABG    Component Value Date/Time   PHART 7.36 04/22/2024 0635   PCO2ART 64 (H) 04/22/2024 0635   PO2ART 223 (H) 04/22/2024 0635   HCO3 36.4 (H) 04/22/2024 0635   TCO2 32 04/18/2024 1943   ACIDBASEDEF 4.6 (H) 06/12/2013 0627   O2SAT 99.5 04/22/2024 0635     Coagulation Profile: Recent Labs  Lab 04/18/24 1940  INR 1.0    Cardiac Enzymes: No results for input(s): CKTOTAL, CKMB, CKMBINDEX, TROPONINI in the last 168 hours.  HbA1C: HbA1c, POC (prediabetic range)  Date/Time Value Ref Range Status  06/28/2023 02:14 PM 6.0 5.7 - 6.4 % Final  06/25/2022 11:59 AM 5.9 5.7 - 6.4 % Final   HbA1c, POC (controlled diabetic range)  Date/Time Value Ref Range Status  02/25/2024 02:34 PM 5.8 0.0 - 7.0 % Final   Hgb A1c MFr Bld  Date/Time Value Ref Range Status  09/07/2022 01:33 PM 5.8 (H) 4.8 - 5.6 % Final    Comment:    (NOTE) Pre diabetes:          5.7%-6.4%  Diabetes:              >6.4%  Glycemic control for   <7.0% adults with diabetes     CBG: Recent Labs  Lab 04/22/24 0542  GLUCAP 136*    Review of Systems:   Review of Systems  Constitutional:  Negative for chills, diaphoresis, fever, malaise/fatigue and weight loss.  HENT:  Negative for congestion.   Respiratory:  Positive for shortness of breath. Negative for cough, hemoptysis, sputum production and wheezing.   Cardiovascular:  Negative for chest pain, palpitations and leg swelling.     Past Medical History:  He,  has a past medical history of Altered mental status, improved at discharge (06/19/2013), Arthritis, Bladder cancer (HCC) (2018), Bronchitis, chronic (HCC) (06/19/2013), CAD (coronary artery disease), Cancer (HCC), Cardiac arrest, hypothermic protocol (06/12/2013), COPD exacerbation (HCC) (06/12/2013), GERD (gastroesophageal reflux disease), leukocytosis, Hyperlipidemia LDL goal <  70 (06/19/2013), Hypokalemia, replaced (06/19/2013), Hypomagnesemia, replaced (06/19/2013), Hypotension, requiring levophed  (06/19/2013), NSTEMI (non-ST elevated myocardial infarction), DES to RCA (06/13/2013), Pre-diabetes, and Tobacco abuse (06/19/2013).   Surgical History:   Past Surgical History:  Procedure Laterality Date   cardiac sstent     COLONOSCOPY WITH PROPOFOL  N/A 11/12/2022   Procedure: COLONOSCOPY WITH PROPOFOL ;  Surgeon: Abran Norleen SAILOR, MD;  Location: [facility];  Service: Gastroenterology;  Laterality: N/A;   CORONARY ANGIOPLASTY WITH STENT PLACEMENT  06/12/2013   Xience Alpine DES to RCA, NSTEMI   CORONARY STENT INTERVENTION N/A 02/24/2020   Procedure: CORONARY STENT INTERVENTION;  Surgeon: Claudene Victory ORN, MD;  Location: [facility];  Service: Cardiovascular;  Laterality: N/A;   CORONARY/GRAFT ACUTE MI REVASCULARIZATION N/A 02/24/2020   Procedure: Coronary/Graft Acute MI Revascularization;  Surgeon: Claudene Victory ORN, MD;  Location: [facility];  Service: Cardiovascular;  Laterality: N/A;   CYSTOSCOPY W/ RETROGRADES Left 03/29/2017   Procedure: CYSTOSCOPY WITH LEFT RETROGRADE PYELOGRAM;  Surgeon: Ottelin, Mark, MD;  Location: [facility];  Service: Urology;  Laterality: Left;   CYSTOSCOPY WITH DIRECT VISION INTERNAL URETHROTOMY N/A 09/15/2022   Procedure: CYSTOSCOPY WITH BALLOON DILATION;  Surgeon: Shona Layman BROCKS, MD;  Location: [facility];  Service: Urology;  Laterality: N/A;   LEFT HEART CATH AND CORONARY ANGIOGRAPHY N/A 02/24/2020   Procedure: LEFT HEART CATH AND CORONARY ANGIOGRAPHY;  Surgeon: Claudene Victory ORN, MD;  Location: [facility];  Service: Cardiovascular;  Laterality: N/A;   LEFT HEART CATHETERIZATION WITH CORONARY ANGIOGRAM N/A 06/12/2013   Procedure: LEFT HEART CATHETERIZATION WITH CORONARY ANGIOGRAM;  Surgeon: Debby DELENA Sor, MD;  Location: [facility];  Service: Cardiovascular;  Laterality: N/A;   POLYPECTOMY  11/12/2022   Procedure: POLYPECTOMY;   Surgeon: Abran Norleen SAILOR, MD;  Location: [facility];  Service: Gastroenterology;;   TRANSURETHRAL RESECTION OF BLADDER TUMOR N/A 03/29/2017   Procedure: TRANSURETHRAL RESECTION OF BLADDER TUMOR (TURBT)/;  Surgeon: Ceil Anes, MD;  Location: [facility];  Service: Urology;  Laterality: N/A;   transurethral ressection of bladder tumor     Dr. Ottelin 03/29/17     Social History:   reports that he quit smoking about 10 years ago. His smoking use included cigarettes. He started smoking about 48 years ago. He has a 76 pack-year smoking history. He has never used smokeless tobacco. He reports that he does not  drink alcohol and does not use drugs.   Family History:  His family history includes Hypertension in an other family member. There is no history of Colon cancer, Stomach cancer, or Esophageal cancer.   Allergies Allergies[1]   Home Medications  Prior to Admission medications  Medication Sig Start Date End Date Taking? Authorizing Provider  acetaminophen  (TYLENOL ) 500 MG tablet Take 500-1,000 mg by mouth every 8 (eight) hours as needed for mild pain or headache.    Yes [provider]  albuterol  (VENTOLIN  HFA) 108 (90 Base) MCG/ACT inhaler Inhale 2 puffs into the lungs every 6 (six) hours as needed for wheezing or shortness of breath. 10/26/22  Yes [name], MD  amLODipine  (NORVASC ) 5 MG tablet Take 1 tablet (5 mg total) by mouth daily. 10/26/23  Yes [name], MD  aspirin  EC 81 MG tablet Take 1 tablet (81 mg total) by mouth daily. 10/21/15  Yes Langeland, Dawn T, MD  atorvastatin  (LIPITOR ) 80 MG tablet Take 1 tablet (80 mg total) by mouth daily. 10/26/23  Yes [name], MD  bisoprolol  (ZEBETA ) 5 MG tablet Take 1 tablet (5 mg total) by mouth in the morning. 12/29/23  Yes Tolia, Sunit, DO  BREZTRI  AEROSPHERE 160-9-4.8 MCG/ACT AERO INHALE 2 PUFFS INTO THE LUNGS IN THE MORNING AND AT BEDTIME 05/10/23  Yes Darlean Ozell NOVAK, MD  famotidine  (PEPCID ) 20 MG tablet One daily  after supper 03/04/23  Yes Darlean Ozell NOVAK, MD  nitroGLYCERIN  (NITROSTAT ) 0.4 MG SL tablet Place 1 tablet (0.4 mg total) under the tongue every 5 (five) minutes as needed for chest pain. 12/16/23  Yes Meng, Hao, PA  pantoprazole  (PROTONIX ) 40 MG tablet Take 1 tablet (40 mg total) by mouth daily. Take 30-60 min before first meal of the day 05/13/23  Yes Darlean Ozell NOVAK, MD  ticagrelor  (BRILINTA ) 60 MG TABS tablet Take 1 tablet (60 mg total) by mouth 2 (two) times daily. Appointment Required For Further Refills [phone] 12/29/23  Yes Tolia, Sunit, DO  finasteride  (PROSCAR ) 5 MG tablet Take 1 tablet (5 mg total) by mouth daily. Patient not taking: Reported on 04/19/2024 07/30/22   Shona Layman BROCKS, MD  OXYGEN Inhale 2 L into the lungs at bedtime.    [provider]     Critical care time:     I have spent a total time of 80-minutes on the day with >50% visit face to face including chart review, data review, collecting history, coordinating care and discussing medical diagnosis and plan with the patient/family. Past medical history, allergies, medications were reviewed. Pertinent imaging, labs and tests included in this note have been reviewed and interpreted independently by me.      Slater Staff, M.D. [facility] Pulmonary/Critical Care Medicine 04/22/2024 12:50 PM   Please see Amion for pager number to reach on-call Pulmonary and Critical Care Team.           [1]  Allergies Allergen Reactions   Ace Inhibitors Other (See Comments)    Caused wheezing   Aspirin  Swelling and Other (See Comments)    Lips swell- still takes approx 2 times a week in 2021 Low dose ASA is fine, no problems   Ibuprofen Swelling and Other (See Comments)    Lips swell   Losartan  Other (See Comments)    Leg cramps   "

## 2024-04-22 NOTE — Progress Notes (Signed)
 " Progress Note   Patient: Timothy [name] FMW:[id number] DOB: 11/21/1955 DOA: 04/18/2024     4 DOS: the patient was seen and examined on 04/22/2024   Brief hospital course: Timothy [name] is a 68 y.o. male with medical history significant for HTN, cardiac arrest vfib/vtach 06/2013, NSTEMI/CAD with stent to RCA, COPD Gold stage III, chronic hypoxic respiratory failure on 2 L at night and as needed, former smoker, prediabetes, pulmonary nodule, GERD, HLD, Elev PSA (neg prostate bx 07/2022) and bladder cancer who presented via EMS for evaluation of shortness of breath, nausea vomiting and diarrhea.  Patient is admitted to hospitalist service for acute on chronic hypoxic respiratory failure, COPD exacerbation.  Assessment and Plan: Acute on chronic hypoxic respiratory failure COPD exacerbation- Patient had worse shortness of breath, wheezing and productive cough, hypoxia requiring 7 L supplemental oxygen upon presentation. He was placed on bipap 12/18 and 12/19 night, got ativan  for anxiety. Pulmonology evaluation called. Advised scheduled ICS/LABA/LAMA neb.  Change IV Solu-Medrol  60mg  q daily. Continue DuoNebs Q4 hourly, finished azithromycin . Continue respiratory toilet, antitussive medication. Continue to wean bipap to baseline 2 L. He may ned nocturnal PAP device. Encourage incentive spirometry, flutter valve.  Abnormal urinalysis- Urine cultures insignificant growth. Stopped Rocephin  therapy.  Acute diarrhea, nausea, vomiting- C. difficile studies negative. Diarrhea improved. Continue antiemetics, stopped IV fluids.  Hypertension: Continue home dose amlodipine , bisoprolol .  CAD status post RCA stent: Continue home dose Brilinta , statin.  History of bladder cancer status post TURP: Urology follow-up as outpatient.  GERD on Protonix .    Out of bed to chair. Incentive spirometry. Nursing supportive care. Fall, aspiration precautions. Diet:  Diet Orders (From admission,  onward)     Start     Ordered   04/18/24 2251  Diet regular Room service appropriate? Yes; Fluid consistency: Thin  Diet effective now       Question Answer Comment  Room service appropriate? Yes   Fluid consistency: Thin      04/18/24 2255           DVT prophylaxis: enoxaparin  (LOVENOX ) injection 40 mg Start: 04/19/24 1000  Level of care: Progressive   Code Status: Full Code  Subjective: Patient is seen and examined today morning. He was hypoxic, confused last night, placed on bipap. During my exam he is still on Bipap, wishes to eat. Did not get out of bed.  Physical Exam: Vitals:   04/22/24 0539 04/22/24 0636 04/22/24 0745 04/22/24 1244  BP: 137/89  122/76 138/84  Pulse:  (!) 114 95 100  Resp:  (!) 29 20 19   Temp: 97.6 F (36.4 C)  97.7 F (36.5 C) 97.9 F (36.6 C)  TempSrc: Oral  Oral Oral  SpO2:  97% 94% 94%  Weight:      Height:        General - Elderly Caucasian thin built male, moderate respiratory distress HEENT - PERRLA, EOMI, atraumatic head, non tender sinuses. Lung - decreased air entry, diffuse rhonchi, wheezes. On bipap Heart - S1, S2 heard, no murmurs, rubs, no pedal edema. Abdomen - Soft, non tender, distended, bowel sounds good Neuro - Alert, awake and oriented x 3, non focal exam. Skin - Warm and dry.  Data Reviewed:      Latest Ref Rng & Units 04/22/2024    4:47 AM 04/21/2024    3:11 AM 04/19/2024    5:18 AM  CBC  WBC 4.0 - 10.5 K/uL 13.3  18.9  9.2   Hemoglobin 13.0 - 17.0  g/dL 85.8  85.0  84.4   Hematocrit 39.0 - 52.0 % 42.4  44.7  45.9   Platelets 150 - 400 K/uL 305  350  346       Latest Ref Rng & Units 04/22/2024    4:47 AM 04/21/2024    3:11 AM 04/19/2024    5:18 AM  BMP  Glucose 70 - 99 mg/dL 864  890  830   BUN 8 - 23 mg/dL 18  16  10    Creatinine 0.61 - 1.24 mg/dL 9.45  9.47  9.28   Sodium 135 - 145 mmol/L 138  141  139   Potassium 3.5 - 5.1 mmol/L 4.7  4.0  3.7   Chloride 98 - 111 mmol/L 99  101  101   CO2 22 - 32  mmol/L 31  30  25    Calcium  8.9 - 10.3 mg/dL 9.0  9.3  9.1    DG Abd Portable 1V Result Date: 04/22/2024 CLINICAL DATA:  Small bowel obstruction. EXAM: PORTABLE ABDOMEN - 1 VIEW COMPARISON:  None Available. FINDINGS: No bowel dilatation or evidence of obstruction. No free air or radiopaque calculi. No acute osseous pathology. Degenerative changes spine. IMPRESSION: Nonobstructive bowel gas pattern. Electronically Signed   By: Vanetta Chou M.D.   On: 04/22/2024 11:05   DG Chest Port 1 View Result Date: 04/21/2024 CLINICAL DATA:  Shortness of breath EXAM: PORTABLE CHEST 1 VIEW COMPARISON:  04/18/2024, chest CT 06/15/2023 FINDINGS: Hyperinflation with emphysema. No acute airspace disease or effusion. Stable cardiomediastinal silhouette. Mild scarring or atelectasis at the lingula. Air-filled structure in the epigastric area IMPRESSION: 1. Hyperinflation with emphysema. No acute airspace disease. 2. Air-filled structure in the epigastric area, presumably air distended stomach or bowel but recommend dedicated abdominal radiographs to further assess. 3. These results will be called to the ordering clinician or representative by the Radiologist Assistant, and communication documented in the PACS or [employer]. Electronically Signed   By: Luke Bun M.D.   On: 04/21/2024 23:47     Family Communication: Discussed with patient, he understand and agree. All questions answered.  Disposition: Status is: Inpatient Remains inpatient appropriate because: hypoxia on bipap, steroids, wean o2, PT/ OT, pulmo eval  Planned Discharge Destination: Home     Time spent: 53 minutes  Author: Concepcion Riser, MD 04/22/2024 3:17 PM Secure chat 7am to 7pm For on call review [email or web address].    "

## 2024-04-22 NOTE — Plan of Care (Signed)

## 2024-04-22 NOTE — Progress Notes (Signed)
 Patient found sitting on the side of his bed in respiratory distress and confused. Five minutes prior RT reported to nurse patient was found to have taken Bi-Pap mask off and so she put him on HFNC. It is not like patient to remove Bi-Pap mask himself, this being the first incident.  Patient would not answer questions such as name, date of birth, or place. Instead he would answer with sarcastic remarks, such as you know what it is, you know where I am, or you tell me, also not this patient's baseline. He readily admits he is confused, and does not know what he is doing here.  No temperature, vitals normal with exception of high RR and HR. Glucose within normal range. Breathing treatment administered. HFNC increased from 6 to 8. Dr. Marcene alerted to situation and rapid response called to beside.

## 2024-04-22 NOTE — Progress Notes (Signed)
" °   04/22/24 2046  Vent Select  Invasive or Noninvasive Noninvasive  Adult Vent Y  Adult Ventilator Settings  Vent Type Servo-air  Vent Mode BIPAP;PSV  FiO2 (%) 40 %  Pressure Support 10 cmH20  PEEP 5 cmH20  Adult Ventilator Measurements  Peak Airway Pressure 14 L/min  Mean Airway Pressure 8 cmH20  Resp Rate Spontaneous 19 br/min  Resp Rate Total 19 br/min  Exhaled Vt 705 mL  Measured Ve 13.2 L  Auto PEEP 0 cmH20  Total PEEP 5 cmH20  Adult Ventilator Alarms  Alarms On Y  Ve High Alarm 22 L/min  Ve Low Alarm 4 L/min  Resp Rate High Alarm 35 br/min  Resp Rate Low Alarm 8  PEEP Low Alarm 2 cmH2O  Press High Alarm 25 cmH2O  Press Low Alarm 2 cmH2O  T Apnea 20 sec(s)  VAP Prevention  HOB> 30 Degrees Y  Breath Sounds  Bilateral Breath Sounds Expiratory wheezes  Vent Respiratory Assessment  Respiratory Pattern Irregular;Labored   Placed pt on bipap to rest due to increased of WOB  "

## 2024-04-22 NOTE — Plan of Care (Signed)
   Problem: Education: Goal: Knowledge of General Education information will improve Description Including pain rating scale, medication(s)/side effects and non-pharmacologic comfort measures Outcome: Progressing

## 2024-04-23 DIAGNOSIS — R0609 Other forms of dyspnea: Secondary | ICD-10-CM

## 2024-04-23 DIAGNOSIS — Z8551 Personal history of malignant neoplasm of bladder: Secondary | ICD-10-CM | POA: Diagnosis not present

## 2024-04-23 DIAGNOSIS — Z711 Person with feared health complaint in whom no diagnosis is made: Secondary | ICD-10-CM

## 2024-04-23 DIAGNOSIS — J441 Chronic obstructive pulmonary disease with (acute) exacerbation: Secondary | ICD-10-CM | POA: Diagnosis not present

## 2024-04-23 DIAGNOSIS — I25118 Atherosclerotic heart disease of native coronary artery with other forms of angina pectoris: Secondary | ICD-10-CM | POA: Diagnosis not present

## 2024-04-23 DIAGNOSIS — J9621 Acute and chronic respiratory failure with hypoxia: Secondary | ICD-10-CM | POA: Diagnosis not present

## 2024-04-23 DIAGNOSIS — Z7189 Other specified counseling: Secondary | ICD-10-CM

## 2024-04-23 DIAGNOSIS — Z515 Encounter for palliative care: Secondary | ICD-10-CM

## 2024-04-23 LAB — CULTURE, BLOOD (ROUTINE X 2)
Culture: NO GROWTH
Culture: NO GROWTH
Special Requests: ADEQUATE
Special Requests: ADEQUATE

## 2024-04-23 MED ORDER — PREDNISONE 20 MG PO TABS
60.0000 mg | ORAL_TABLET | Freq: Every day | ORAL | Status: DC
  Administered 2024-04-24 – 2024-04-26 (×3): 60 mg via ORAL
  Filled 2024-04-23 (×3): qty 3

## 2024-04-23 MED ORDER — ALBUTEROL SULFATE (2.5 MG/3ML) 0.083% IN NEBU
2.5000 mg | INHALATION_SOLUTION | RESPIRATORY_TRACT | Status: DC | PRN
  Administered 2024-04-23 – 2024-04-25 (×2): 2.5 mg via RESPIRATORY_TRACT
  Filled 2024-04-23 (×2): qty 3

## 2024-04-23 MED ORDER — IPRATROPIUM-ALBUTEROL 0.5-2.5 (3) MG/3ML IN SOLN
3.0000 mL | Freq: Two times a day (BID) | RESPIRATORY_TRACT | Status: DC
  Administered 2024-04-23: 3 mL via RESPIRATORY_TRACT
  Filled 2024-04-23: qty 3

## 2024-04-23 MED ORDER — MORPHINE SULFATE 10 MG/5ML PO SOLN: 2.5000 mg | ORAL | Status: DC | PRN

## 2024-04-23 NOTE — Progress Notes (Signed)
 "  NAME:  Timothy [name], MRN:  [id number], DOB:  1956/03/09, LOS: 5 ADMISSION DATE:  04/18/2024, CONSULTATION DATE:  04/22/24 REFERRING MD:  Concepcion Riser MD CHIEF COMPLAINT:  COPD exacerbation   History of Present Illness:  68 year old male former smoker (quit in 2010) with pertinent medical history of COPD, nocturnal respiratory failure on 2L who presented for SOB, wheezing, N/V/D and admitted on 04/18/24 for COPD exacerbation with increased O2 requirement to 7L and requiring BiPAP. CXR neg for infiltrate or effusion or edema. Started on rocephin , azithmycin, steroids, mag and nebulizers. Neg for covid, RSV and flu. C.diff negative. Initial O2 requirement improved to 4L however overnight on 12/19 had worsening shortness of breath and hypoxemia and given ativan  for BiPAP tolerance. Continued to have increased WOB that same evening and requiring BiPAP again. Solumedrol was repeated and repeat CXR with no acute changes. VBG 7.42/56/104. Patient had some confusion and persistent wheezing that was treated with Duoneb. Early in the morning of 12/20 patient in respiratory distress and confused after found with BiPAP mask removed by patient. Repeat ABG 7.36/64/223/36. D dimer neg. PCCM consulted for recommendations.  Prior studies Followed by Dr. Darlean at [facility]. On [place] . Unable to obtain ohtuvayre  due to deductible. Last visit in 11/2023  PFT 07/01/20 FVC 2.80 (72%) FEV1 1.35 (46%) Ratio 48  TLC 108% RV 174% DLCO 40% Interpretation: Severe obstructive defect with air trapping and moderately reduced gas exchange consistent with emphysema  CT Chest lung scree 06/15/23 Bullous emphysema, subcentimeter stable calcified and noncalcified pulmonary nodules  Pertinent  Medical History  Emphysema, COPD, chronic respiratory failure on 2L, CAD, hx cardiac arrest 2015 s/p PCI to RCA, HTN, HLD, bladder cancer s/p TURB, GERD  Significant Hospital Events: Including procedures, antibiotic start and  stop dates in addition to other pertinent events   12/20 PCCM consulted for assistance in management of underlying COPD 12/21 seen sitting up in bed on 6 L nasal cannula  Interim History / Subjective:  States he feels much better this a.m. with no acute complaints  Objective    Blood pressure 110/65, pulse 80, temperature 97.6 F (36.4 C), temperature source Oral, resp. rate (!) 24, height 5' 6 (1.676 m), weight 54.8 kg, SpO2 97%.    Vent Mode: BIPAP;PSV FiO2 (%):  [40 %] 40 % PEEP:  [5 cmH20] 5 cmH20 Pressure Support:  [10 cmH20] 10 cmH20   Intake/Output Summary (Last 24 hours) at 04/23/2024 9266 Last data filed at 04/23/2024 0249 Gross per 24 hour  Intake 360 ml  Output 860 ml  Net -500 ml   Filed Weights   04/18/24 1919 04/19/24 1112  Weight: 56.7 kg 54.8 kg   Physical Exam: General: Acute on chronic ill-appearing middle-age male in no acute distress HEENT: [place]/AT, MM pink/moist, PERRL,  Neuro: Alert and oriented x 3, nonfocal CV: s1s2 regular rate and rhythm, no murmur, rubs, or gallops,  PULM: Slightly diminished bilaterally, no added breath sounds, on 6 L nasal cannula GI: soft, bowel sounds active in all 4 quadrants, non-tender, non-distended, tolerating  Extremities: warm/dry, no edema  Skin: no rashes or lesions  Assessment and Plan   Severe bullous COPD with emphysema COPD exacerbation Acute on chronic hypoxemic respiratory failure Chronic hypercarbic respiratory failure -Given the severity of his chronic lung disease and presumed progression from severe status on 2022 PFTs, he is likely slow to improve and will require nocturnal ventilation for respiratory support --Addressed questions regarding the clinical course of his COPD and  likely progression of his symptoms. On triple therapy and oxygen as an outpatient. Would consider adding home nocturnal ventilation for support for his hypercarbic respiratory failure. Ohtuvayre  was previously considered however  financial limitations prevented him from obtaining but due to his bullous emphysema, this may not be an effective option for treatment.  P: Continue current scheduled Brovana , Pulmicort  and DuoNebs Continue slightly reduced dose IV Solu-Medrol  to 60 mg can likely transition to p.o. today versus tomorrow now that he is not a BiPAP dependent Wean supplemental oxygen as able Continue nocturnal mechanical support, may benefit from this at discharge Aspiration precautions Mobilize as able Encourage adequate and frequent pulmonary hygiene Ongoing goals of care discussion May benefit from low-dose systemic opioid for refractory breathlessness  Pulmonary will continue to follow  Critical care time: NA  Shourya Macpherson D. Harris, NP-C [facility] Personal contact information can be found on Amion  If no contact or response made please call [phone] 04/23/2024, 7:33 AM        "

## 2024-04-23 NOTE — Plan of Care (Signed)

## 2024-04-23 NOTE — Consult Note (Signed)
 " Palliative Medicine Inpatient Consult Note  Consulting Provider: Kassie Acquanetta Bradley, MD   Reason for consult:   Palliative Care Consult Services Palliative Medicine Consult  Reason for Consult? GOC, bullous emphysema, COPD exacerbation, requiring nocturnal ventilation   04/23/2024  HPI:  Per intake H&P -->  Timothy [name] is a 68 y.o. male with medical history significant for HTN, cardiac arrest vfib/vtach 06/2013, NSTEMI/CAD with stent to RCA, COPD Gold stage III, chronic hypoxic respiratory failure on 2 L at night and as needed, former smoker, prediabetes, pulmonary nodule, GERD, HLD, Elev PSA (neg prostate bx 07/2022) and bladder cancer who presented via EMS for evaluation of shortness of breath, nausea vomiting and diarrhea.  Palliative care has been asked to support additional goals of care conversations in the setting of advanced pulmonary disease.  Clinical Assessment/Goals of Care:  *Please note that this is a verbal dictation therefore any spelling or grammatical errors are due to the Dragon Medical One system interpretation.  I have reviewed medical records including EPIC notes, labs and imaging, received report from bedside RN, assessed the patient who is lying in bed in no acute distress this morning.    I met with Timothy [name] to further discuss diagnosis prognosis, GOC, EOL wishes, disposition and options.   I introduced Palliative Medicine as specialized medical care for people living with serious illness. It focuses on providing relief from the symptoms and stress of a serious illness. The goal is to improve quality of life for both the patient and the family.  Medical History Review and Understanding:  A review of Timothy [name]'s past medical history significant for hypertension, cardiac arrest a decade ago, coronary artery disease, chronic obstructive pulmonary disease requiring 2 L of oxygen at home during the evening, GERD, hyperlipidemia, and bladder cancer was  completed.  Social History:  Prior to hospitalization married he had been living in a single-family home with his significant other and grandson.  He shares that he is divorced.  He had 2 children though sadly 1 is deceased.  Already has 1 daughter and 3 grandchildren.  He formally worked as an journalist, newspaper and enjoys tinkering with things.   Functional and Nutritional State:  Prior to hospitalization Timothy [name] shares she could do all B ADLs independently.  He states she could also do IADLs for the most part.  He does have a decent appetite overall.  Advance Directives:  A detailed discussion was had today regarding advanced directives.  Timothy [name] does not have an advanced directive however I was able to provide a blank copy and review both the healthcare power of attorney and living will parts.  He shares he will look this over.  Code Status:  Concepts specific to code status, artifical feeding and hydration, continued IV antibiotics and rehospitalization was had.  The difference between a aggressive medical intervention path  and a palliative comfort care path for this patient at this time was had.   Encouraged patient/family to consider DNR/DNI status understanding evidenced based poor outcomes in similar hospitalized patient, as the cause of arrest is likely associated with advanced chronic/terminal illness rather than an easily reversible acute cardio-pulmonary event. I explained that DNR/DNI does not change the medical plan and it only comes into effect after a person has arrested (died).  It is a protective measure to keep us  from harming the patient in their last moments of life.   Timothy [name] shares that he has been through resuscitation before and it was successful therefore if he were  to suffer a cardiac arrest she would want chest compressions and intubation.  He does share he would not want to live a life where he were dependent on others.  Discussion:  Already and I reviewed his reasons  for hospitalization inclusive of him having increased shortness of breath over a 4-day period who shares he also had worsening nausea and loose bowels.  Since hospitalization Timothy [name] has had improvement in his nausea and bowels.  He still maintains that he is having shortness of breath.  We reviewed the reasons why he was having ongoing shortness of breath inclusive of the severity of his COPD.  We reviewed that this is a chronic progressive the disease that even with appropriate medical management will worsen over time.  Right [name] Timothy [name] does share he has had improvement from the perspective of feeling breathless with BiPAP.  We discussed that he will likely need ongoing BiPAP support in the evening hours which he is agreeable towards.  We discussed the potential long-term decline in the setting of advanced COPD.  I shared it is not uncommon for this disease to get to a point where initiation of hospice is necessary. I described hospice as a service for patients who have a life expectancy of 6 months or less. The goal of hospice is the preservation of dignity and quality at the end phases of life. Under hospice care, the focus changes from curative to symptom relief.   As of this point in time Timothy [name] wants to continue current care.  He understands his health is declining but shares he just wants to keep doing what can be done for him to prolong his life.  He shares that he does not have any goals he wants to achieve are accomplished in the near future but he does still want to continue all that can be done for him as he does have quality in his life.  We discussed the idea of outpatient palliative support to help Timothy [name] symptoms and continue goals of care conversation which she is in agreement with.  Discussed the importance of continued conversation with family and their  medical providers regarding overall plan of care and treatment options, ensuring decisions are within the context of the patients values and  GOCs.  Decision Maker: Patient shares he needs time to consider who he would want to be his surrogate decision maker if he were unable to make decisions for himself  SUMMARY OF RECOMMENDATIONS   Full code/full scope of care  Open and honest conversations held in the setting of patient's advanced COPD --> we discussed that this is a chronic progressive disease and despite good medical management will worsen over time  Discussed options moving forward inclusive of more comprehensive symptom management --> potential if over time a worsening clinical condition despite aggressive treatments the thought of hospice  Discussed the importance of advanced directives and provided patient --> a blank copy for review and completion  Timothy [name] is agreeable to outpatient palliative support  Ongoing palliative care support during hospitalization  Symptoms: Dyspnea: - Low-dose morphine  2.5 mg every 4 hours as needed  Code Status/Advance Care Planning: FULL CODE  Palliative Prophylaxis:  Aspiration, Bowel Regimen, Delirium Protocol, Frequent Pain Assessment, Oral Care, Palliative Wound Care, and Turn Reposition  Additional Recommendations (Limitations, Scope, Preferences): Continue current care  Psycho-social/Spiritual:  Desire for further Chaplaincy support: Not at this time Additional Recommendations: Education on COPD progression   Prognosis: Worrisome in the setting of patient's chronic comorbid conditions  and severity of COPD itself.  Given patient's high disease burden he is at a higher 74-month mortality risk.  Discharge Planning: Discharge plan to be determined.  Vitals:   04/22/24 2336 04/23/24 0413  BP: 111/76 110/65  Pulse: 92 80  Resp: (!) 22 (!) 24  Temp: 97.7 F (36.5 C) 97.6 F (36.4 C)  SpO2: 95% 97%    Intake/Output Summary (Last 24 hours) at 04/23/2024 0713 Last data filed at 04/23/2024 0249 Gross per 24 hour  Intake 360 ml  Output 860 ml  Net -500 ml    Last Weight  Most recent update: 04/19/2024 11:25 AM    Weight  54.8 kg (120 lb 13 oz)             LABS: CBC:    Component Value Date/Time   WBC 13.3 (H) 04/22/2024 0447   HGB 14.1 04/22/2024 0447   HGB 17.1 06/28/2023 1519   HCT 42.4 04/22/2024 0447   HCT 50.8 06/28/2023 1519   PLT 305 04/22/2024 0447   PLT 317 06/28/2023 1519   MCV 90.8 04/22/2024 0447   MCV 92 06/28/2023 1519   NEUTROABS 11.7 (H) 04/22/2024 0447   NEUTROABS 11.5 (H) 06/28/2023 1519   LYMPHSABS 1.0 04/22/2024 0447   LYMPHSABS 2.3 06/28/2023 1519   MONOABS 0.4 04/22/2024 0447   EOSABS 0.0 04/22/2024 0447   EOSABS 0.2 06/28/2023 1519   BASOSABS 0.1 04/22/2024 0447   BASOSABS 0.1 06/28/2023 1519   Comprehensive Metabolic Panel:    Component Value Date/Time   NA 138 04/22/2024 0447   NA 141 06/28/2023 1519   K 4.7 04/22/2024 0447   CL 99 04/22/2024 0447   CO2 31 04/22/2024 0447   BUN 18 04/22/2024 0447   BUN 16 06/28/2023 1519   CREATININE 0.54 (L) 04/22/2024 0447   CREATININE 0.85 07/27/2022 1427   CREATININE 0.74 10/21/2015 1056   GLUCOSE 135 (H) 04/22/2024 0447   CALCIUM  9.0 04/22/2024 0447   AST 21 04/22/2024 0447   AST 15 07/27/2022 1427   ALT 29 04/22/2024 0447   ALT 18 07/27/2022 1427   ALKPHOS 88 04/22/2024 0447   BILITOT 0.5 04/22/2024 0447   BILITOT 0.8 06/28/2023 1519   BILITOT 0.8 07/27/2022 1427   PROT 6.2 (L) 04/22/2024 0447   PROT 7.7 06/28/2023 1519   ALBUMIN 3.7 04/22/2024 0447   ALBUMIN 4.9 06/28/2023 1519    Gen: Elderly Caucasian male chronically ill in appearance HEENT: moist mucous membranes CV: Regular rate and rhythm PULM: On 6 L high flow nasal cannula breathing is slightly labored. ABD: soft/nontender EXT: No edema Neuro: Alert and oriented x3  PPS: 40-50%   This conversation/these recommendations were discussed with patient primary care team, Dr. Darci ______________________________________________________ Rosaline Becton [facility]  Palliative Medicine Team Team Cell Phone: [phone] Please utilize secure chat with additional questions, if there is no response within 30 minutes please call the above phone number  Total Time: 75 Billing based on MDM: High  Palliative Medicine Team providers are available by phone from 7am to 7pm daily and can be reached through the team cell phone.  Should this patient require assistance outside of these hours, please call the patient's attending physician.  "

## 2024-04-23 NOTE — Plan of Care (Signed)
   Problem: Clinical Measurements: Goal: Ability to maintain clinical measurements within normal limits will improve Outcome: Progressing

## 2024-04-23 NOTE — Progress Notes (Signed)
 " Progress Note   Patient: Timothy [name] FMW:[id number] DOB: [birth year] DOA: 04/18/2024     5 DOS: the patient was seen and examined on 04/23/2024   Brief hospital course: JERL MUNYAN is a 68 y.o. male with medical history significant for HTN, cardiac arrest vfib/vtach 06/2013, NSTEMI/CAD with stent to RCA, COPD Gold stage III, chronic hypoxic respiratory failure on 2 L at night and as needed, former smoker, prediabetes, pulmonary nodule, GERD, HLD, Elev PSA (neg prostate bx 07/2022) and bladder cancer who presented via EMS for evaluation of shortness of breath, nausea vomiting and diarrhea.  Patient is admitted to hospitalist service for acute on chronic hypoxic respiratory failure, COPD exacerbation.  Assessment and Plan: Acute on chronic hypoxic respiratory failure COPD exacerbation- Patient had worse shortness of breath, wheezing and productive cough, hypoxia requiring 7 L supplemental oxygen upon presentation. He was placed on bipap 12/18 and 12/19 night, got ativan  for anxiety. Pulmonology evaluation called. Advised scheduled brovana , pulmicort . Change IV Solu-Medrol  60mg  q daily. Continue DuoNebs Q4 hourly, finished azithromycin . Continue respiratory toilet, antitussive medication. Continue to wean bipap to baseline 2 L. He may ned nocturnal PAP device. Palliative medicine consulted- he wishes outpatient palliative care.  Abnormal urinalysis- Urine cultures insignificant growth. Stopped Rocephin  therapy.  Acute diarrhea, nausea, vomiting- C. difficile studies negative. Diarrhea improved. Continue antiemetics, stopped IV fluids.  Hypertension: Continue home dose amlodipine , bisoprolol .  CAD status post RCA stent: Continue home dose Brilinta , statin.  History of bladder cancer status post TURP: Urology follow-up as outpatient.  GERD on Protonix .    Out of bed to chair. Incentive spirometry. Nursing supportive care. Fall, aspiration precautions. Diet:  Diet Orders  (From admission, onward)     Start     Ordered   04/18/24 2251  Diet regular Room service appropriate? Yes; Fluid consistency: Thin  Diet effective now       Question Answer Comment  Room service appropriate? Yes   Fluid consistency: Thin      04/18/24 2255           DVT prophylaxis: enoxaparin  (LOVENOX ) injection 40 mg Start: 04/19/24 1000  Level of care: Progressive   Code Status: Full Code  Subjective: Patient is seen and examined today morning. He is off Bipap, feels better. Breathing improved. Wishes to get out of bed.  Physical Exam: Vitals:   04/22/24 1921 04/22/24 2336 04/23/24 0413 04/23/24 0756  BP: 122/79 111/76 110/65 134/79  Pulse: 95 92 80 (!) 101  Resp: 20 (!) 22 (!) 24 20  Temp:  97.7 F (36.5 C) 97.6 F (36.4 C) 97.6 F (36.4 C)  TempSrc:  Oral Oral Oral  SpO2: 95% 95% 97% 96%  Weight:      Height:        General - Elderly Caucasian thin built male, mild respiratory distress HEENT - PERRLA, EOMI, atraumatic head, non tender sinuses. Lung - decreased air entry, diffuse rhonchi, wheezes. Not on bipap Heart - S1, S2 heard, no murmurs, rubs, no pedal edema. Abdomen - Soft, non tender, distended, bowel sounds good Neuro - Alert, awake and oriented x 3, non focal exam. Skin - Warm and dry.  Data Reviewed:      Latest Ref Rng & Units 04/22/2024    4:47 AM 04/21/2024    3:11 AM 04/19/2024    5:18 AM  CBC  WBC 4.0 - 10.5 K/uL 13.3  18.9  9.2   Hemoglobin 13.0 - 17.0 g/dL 85.8  85.0  84.4  Hematocrit 39.0 - 52.0 % 42.4  44.7  45.9   Platelets 150 - 400 K/uL 305  350  346       Latest Ref Rng & Units 04/22/2024    4:47 AM 04/21/2024    3:11 AM 04/19/2024    5:18 AM  BMP  Glucose 70 - 99 mg/dL 864  890  830   BUN 8 - 23 mg/dL 18  16  10    Creatinine 0.61 - 1.24 mg/dL 9.45  9.47  9.28   Sodium 135 - 145 mmol/L 138  141  139   Potassium 3.5 - 5.1 mmol/L 4.7  4.0  3.7   Chloride 98 - 111 mmol/L 99  101  101   CO2 22 - 32 mmol/L 31  30  25     Calcium  8.9 - 10.3 mg/dL 9.0  9.3  9.1    DG Abd Portable 1V Result Date: 04/22/2024 CLINICAL DATA:  Small bowel obstruction. EXAM: PORTABLE ABDOMEN - 1 VIEW COMPARISON:  None Available. FINDINGS: No bowel dilatation or evidence of obstruction. No free air or radiopaque calculi. No acute osseous pathology. Degenerative changes spine. IMPRESSION: Nonobstructive bowel gas pattern. Electronically Signed   By: Vanetta Chou M.D.   On: 04/22/2024 11:05   DG Chest Port 1 View Result Date: 04/21/2024 CLINICAL DATA:  Shortness of breath EXAM: PORTABLE CHEST 1 VIEW COMPARISON:  04/18/2024, chest CT 06/15/2023 FINDINGS: Hyperinflation with emphysema. No acute airspace disease or effusion. Stable cardiomediastinal silhouette. Mild scarring or atelectasis at the lingula. Air-filled structure in the epigastric area IMPRESSION: 1. Hyperinflation with emphysema. No acute airspace disease. 2. Air-filled structure in the epigastric area, presumably air distended stomach or bowel but recommend dedicated abdominal radiographs to further assess. 3. These results will be called to the ordering clinician or representative by the Radiologist Assistant, and communication documented in the PACS or [employer]. Electronically Signed   By: Luke Bun M.D.   On: 04/21/2024 23:47     Family Communication: Discussed with patient, he understand and agree. All questions answered.  Disposition: Status is: Inpatient Remains inpatient appropriate because: hypoxia, steroids, wean o2, PT/ OT, pulmo follow up.  Planned Discharge Destination: Home     Time spent: 51 minutes  Author: Concepcion Riser, MD 04/23/2024 1:26 PM Secure chat 7am to 7pm For on call review [email or web address].    "

## 2024-04-24 DIAGNOSIS — J9621 Acute and chronic respiratory failure with hypoxia: Secondary | ICD-10-CM | POA: Diagnosis not present

## 2024-04-24 DIAGNOSIS — I25118 Atherosclerotic heart disease of native coronary artery with other forms of angina pectoris: Secondary | ICD-10-CM | POA: Diagnosis not present

## 2024-04-24 DIAGNOSIS — E440 Moderate protein-calorie malnutrition: Secondary | ICD-10-CM | POA: Diagnosis not present

## 2024-04-24 DIAGNOSIS — J441 Chronic obstructive pulmonary disease with (acute) exacerbation: Secondary | ICD-10-CM | POA: Diagnosis not present

## 2024-04-24 DIAGNOSIS — Z955 Presence of coronary angioplasty implant and graft: Secondary | ICD-10-CM | POA: Diagnosis not present

## 2024-04-24 DIAGNOSIS — E782 Mixed hyperlipidemia: Secondary | ICD-10-CM | POA: Diagnosis not present

## 2024-04-24 DIAGNOSIS — Z8551 Personal history of malignant neoplasm of bladder: Secondary | ICD-10-CM | POA: Diagnosis not present

## 2024-04-24 MED ORDER — IPRATROPIUM-ALBUTEROL 0.5-2.5 (3) MG/3ML IN SOLN
3.0000 mL | Freq: Three times a day (TID) | RESPIRATORY_TRACT | Status: DC
  Administered 2024-04-25: 3 mL via RESPIRATORY_TRACT
  Filled 2024-04-24: qty 3

## 2024-04-24 MED ORDER — IPRATROPIUM-ALBUTEROL 0.5-2.5 (3) MG/3ML IN SOLN
3.0000 mL | Freq: Three times a day (TID) | RESPIRATORY_TRACT | Status: DC
  Administered 2024-04-24: 3 mL via RESPIRATORY_TRACT
  Filled 2024-04-24: qty 3

## 2024-04-24 MED ORDER — BUDESON-GLYCOPYRROL-FORMOTEROL 160-9-4.8 MCG/ACT IN AERO
2.0000 | INHALATION_SPRAY | Freq: Two times a day (BID) | RESPIRATORY_TRACT | Status: DC
  Administered 2024-04-24 – 2024-04-26 (×5): 2 via RESPIRATORY_TRACT
  Filled 2024-04-24: qty 5.9

## 2024-04-24 NOTE — Progress Notes (Signed)
 "  Palliative Medicine Inpatient Follow Up Note HPI: Timothy [name] is a 68 y.o. male with medical history significant for HTN, cardiac arrest vfib/vtach 06/2013, NSTEMI/CAD with stent to RCA, COPD Gold stage III, chronic hypoxic respiratory failure on 2 L at night and as needed, former smoker, prediabetes, pulmonary nodule, GERD, HLD, Elev PSA (neg prostate bx 07/2022) and bladder cancer who presented via EMS for evaluation of shortness of breath, nausea vomiting and diarrhea.  Palliative care has been asked to support additional goals of care conversations in the setting of advanced pulmonary disease.   Today's Discussion 04/24/2024  *Please note that this is a verbal dictation therefore any spelling or grammatical errors are due to the Dragon Medical One system interpretation.  I reviewed the chart notes including nursing notes from today, progress notes from today. I also reviewed vital signs, nursing flowsheets, medication administrations record, labs, and imaging.    Oral Intake %:  100% I/O:  (-) Bowel Movements:  Last 12/18 Mobility: Able to mobilize with O2  I met with Timothy [name] at bedside this morning. He is awake and alert. He shares with me that he has been very busy with multiple providers coming in and out of his room. He shares understanding of us  meeting yesterday but has not had a chance to review the information provided.   Created space and opportunity for patient to explore thoughts feelings and fears regarding current medical situation. He shares that he is hopeful to get home in the next day or two. He understands he has a chronic progressive disease though would like to keep on keeping on. He shares that he does plan to talk to his family about what quality of life would and would not be acceptable to him.   We discussed the importance of bipap at home and adherence/regular use of the device.   Questions and concerns addressed/Palliative Support Provided.    Objective Assessment: Vital Signs Vitals:   04/24/24 0832 04/24/24 1121  BP: (!) 142/72 (!) 146/77  Pulse: 96   Resp: 17   Temp: 98 F (36.7 C)   SpO2: 93%     Intake/Output Summary (Last 24 hours) at 04/24/2024 1139 Last data filed at 04/24/2024 0831 Gross per 24 hour  Intake 840 ml  Output 1350 ml  Net -510 ml   Last Weight  Most recent update: 04/19/2024 11:25 AM    Weight  54.8 kg (120 lb 13 oz)            Gen: Elderly Caucasian male chronically ill in appearance HEENT: moist mucous membranes CV: Regular rate and rhythm PULM: On 6 L high flow nasal cannula breathing is slightly labored. ABD: soft/nontender EXT: No edema Neuro: Alert and oriented x3  SUMMARY OF RECOMMENDATIONS   Full code/full scope of care   Discussed the importance of advanced directives and provided patient --> a blank copy for review and completion which Timothy [name] plans to look over when able   [facility] right now is agreeable to outpatient palliative support   Ongoing palliative care support during hospitalization   Symptoms: Dyspnea: - Low-dose morphine  2.5 mg every 4 hours as needed ______________________________________________________________________________________ Timothy [name] Palliative Medicine Team Team Cell Phone: [phone] Please utilize secure chat with additional questions, if there is no response within 30 minutes please call the above phone number  Time Spent: 33 Billing based on MDM: Moderate   Palliative Medicine Team providers are available by phone from 7am to 7pm daily and  can be reached through the team cell phone.  Should this patient require assistance outside of these hours, please call the patient's attending physician.     "

## 2024-04-24 NOTE — Progress Notes (Signed)
 " Progress Note   Patient: Timothy [name] FMW:[id number] DOB: 07-Nov-1955 DOA: 04/18/2024     6 DOS: the patient was seen and examined on 04/24/2024   Brief hospital course: Timothy [name] is a 68 y.o. male with medical history significant for HTN, cardiac arrest vfib/vtach 06/2013, NSTEMI/CAD with stent to RCA, COPD Gold stage III, chronic hypoxic respiratory failure on 2 L at night and as needed, former smoker, prediabetes, pulmonary nodule, GERD, HLD, Elev PSA (neg prostate bx 07/2022) and bladder cancer who presented via EMS for evaluation of shortness of breath, nausea vomiting and diarrhea.  Patient is admitted to hospitalist service for acute on chronic hypoxic respiratory failure, COPD exacerbation.  Assessment and Plan: Acute on chronic hypoxic respiratory failure COPD exacerbation- Patient had worse shortness of breath, wheezing and productive cough, hypoxia requiring 7 L supplemental oxygen upon presentation. He was placed on bipap 12/18 and 12/19 night, got ativan  for anxiety. Pulmonology follow up appreciated. Brreztri resumed. IV Solu-Medrol  60mg  q daily transitioned to oral prednisone  per PCCM. Continue DuoNebs Q4 hourly, finished azithromycin . Continue respiratory toilet, antitussive medication. Continue to wean bipap to baseline 2 L. TOC working on nocturnal PAP device. Signed orders for Bipap and supplies. Palliative medicine consulted- he wishes outpatient palliative care. HHPT ordered.  Abnormal urinalysis- Urine cultures insignificant growth. Stopped Rocephin  therapy.  Acute diarrhea, nausea, vomiting- C. difficile studies negative. Diarrhea improved. Continue antiemetics, stopped IV fluids.  Hypertension: Continue home dose amlodipine , bisoprolol .  CAD status post RCA stent: Continue home dose Brilinta , statin.  History of bladder cancer status post TURP: Urology follow-up as outpatient.  GERD on Protonix .    Out of bed to chair. Incentive  spirometry. Nursing supportive care. Fall, aspiration precautions. Diet:  Diet Orders (From admission, onward)     Start     Ordered   04/18/24 2251  Diet regular Room service appropriate? Yes; Fluid consistency: Thin  Diet effective now       Question Answer Comment  Room service appropriate? Yes   Fluid consistency: Thin      04/18/24 2255           DVT prophylaxis: enoxaparin  (LOVENOX ) injection 40 mg Start: 04/19/24 1000  Level of care: Progressive   Code Status: Full Code  Subjective: Patient is seen and examined today morning. He is on 4L supplemental oxygen, feels better. Wishes to go home. Eating fair.  Physical Exam: Vitals:   04/24/24 0832 04/24/24 1121 04/24/24 1143 04/24/24 1613  BP: (!) 142/72 (!) 146/77 132/80 138/77  Pulse: 96  95 97  Resp: 17  19 20   Temp: 98 F (36.7 C)  97.8 F (36.6 C) 97.7 F (36.5 C)  TempSrc: Oral  Oral Oral  SpO2: 93%  92% 92%  Weight:      Height:        General - Elderly Caucasian thin built male, no respiratory distress HEENT - PERRLA, EOMI, atraumatic head, non tender sinuses. Lung - decreased air entry, diffuse rhonchi, wheezes. Not on bipap Heart - S1, S2 heard, no murmurs, rubs, no pedal edema. Abdomen - Soft, non tender, distended, bowel sounds good Neuro - Alert, awake and oriented x 3, non focal exam. Skin - Warm and dry.  Data Reviewed:      Latest Ref Rng & Units 04/22/2024    4:47 AM 04/21/2024    3:11 AM 04/19/2024    5:18 AM  CBC  WBC 4.0 - 10.5 K/uL 13.3  18.9  9.2   Hemoglobin  13.0 - 17.0 g/dL 85.8  85.0  84.4   Hematocrit 39.0 - 52.0 % 42.4  44.7  45.9   Platelets 150 - 400 K/uL 305  350  346       Latest Ref Rng & Units 04/22/2024    4:47 AM 04/21/2024    3:11 AM 04/19/2024    5:18 AM  BMP  Glucose 70 - 99 mg/dL 864  890  830   BUN 8 - 23 mg/dL 18  16  10    Creatinine 0.61 - 1.24 mg/dL 9.45  9.47  9.28   Sodium 135 - 145 mmol/L 138  141  139   Potassium 3.5 - 5.1 mmol/L 4.7  4.0  3.7    Chloride 98 - 111 mmol/L 99  101  101   CO2 22 - 32 mmol/L 31  30  25    Calcium  8.9 - 10.3 mg/dL 9.0  9.3  9.1    No results found.    Family Communication: Discussed with patient, he understand and agree. All questions answered.  Disposition: Status is: Inpatient Remains inpatient appropriate because: hypoxia, oral steroids, wean o2, HHPT, PAP device at home.  Planned Discharge Destination: Home     Time spent: 52 minutes  Author: Concepcion Riser, MD 04/24/2024 4:37 PM Secure chat 7am to 7pm For on call review [email or web address].    "

## 2024-04-24 NOTE — Progress Notes (Signed)
 "  NAME:  Timothy [name], MRN:  [id number], DOB:  12-29-1955, LOS: 6 ADMISSION DATE:  04/18/2024, CONSULTATION DATE:  04/22/24 REFERRING MD:  Concepcion Riser MD CHIEF COMPLAINT:  COPD exacerbation   History of Present Illness:  68 year old male former smoker (quit in 2010) with pertinent medical history of COPD, nocturnal respiratory failure on 2L who presented for SOB, wheezing, N/V/D and admitted on 04/18/24 for COPD exacerbation with increased O2 requirement to 7L and requiring BiPAP. CXR neg for infiltrate or effusion or edema. Started on rocephin , azithmycin, steroids, mag and nebulizers. Neg for covid, RSV and flu. C.diff negative. Initial O2 requirement improved to 4L however overnight on 12/19 had worsening shortness of breath and hypoxemia and given ativan  for BiPAP tolerance. Continued to have increased WOB that same evening and requiring BiPAP again. Solumedrol was repeated and repeat CXR with no acute changes. VBG 7.42/56/104. Patient had some confusion and persistent wheezing that was treated with Duoneb. Early in the morning of 12/20 patient in respiratory distress and confused after found with BiPAP mask removed by patient. Repeat ABG 7.36/64/223/36. D dimer neg. PCCM consulted for recommendations.  Prior studies Followed by Dr. Darlean at [facility]. On Breztri . Unable to obtain ohtuvayre  due to deductible. Last visit in 11/2023  PFT 07/01/20 FVC 2.80 (72%) FEV1 1.35 (46%) Ratio 48  TLC 108% RV 174% DLCO 40% Interpretation: Severe obstructive defect with air trapping and moderately reduced gas exchange consistent with emphysema  CT Chest lung scree 06/15/23 Bullous emphysema, subcentimeter stable calcified and noncalcified pulmonary nodules  Pertinent  Medical History  Emphysema, COPD, chronic respiratory failure on 2L, CAD, hx cardiac arrest 2015 s/p PCI to RCA, HTN, HLD, bladder cancer s/p TURB, GERD  Significant Hospital Events: Including procedures, antibiotic start and  stop dates in addition to other pertinent events   12/20 PCCM consulted for assistance in management of underlying COPD 12/21 seen sitting up in bed on 6 L nasal cannula  Interim History / Subjective:  Sitting in chair. On 4L right now.  Reports feeling better and SOB improved.  Wore BiPAP last night.   Objective    Blood pressure (!) 142/72, pulse 96, temperature 98 F (36.7 C), temperature source Oral, resp. rate 17, height 5' 6 (1.676 m), weight 54.8 kg, SpO2 93%.    FiO2 (%):  [40 %-44 %] 40 % PEEP:  [5 cmH20] 5 cmH20 Pressure Support:  [10 cmH20] 10 cmH20   Intake/Output Summary (Last 24 hours) at 04/24/2024 1022 Last data filed at 04/24/2024 0831 Gross per 24 hour  Intake 840 ml  Output 1350 ml  Net -510 ml   Filed Weights   04/18/24 1919 04/19/24 1112  Weight: 56.7 kg 54.8 kg   Physical Exam:  General: Acute on chronic ill-appearing middle-age male in no acute distress HEENT: [place]/AT, MM pink/moist, PERRL Neuro: A&Ox3, no focal deficits CV: RRR PULM: normal effort on 4L, diminished breath sounds bilaterally, no wheezing Extremities: warm/dry, no edema   Assessment and Plan   Severe bullous COPD with emphysema COPD exacerbation Acute on chronic hypoxemic respiratory failure Chronic hypercarbic respiratory failure -Given the severity of his chronic lung disease and presumed progression from severe status on 2022 PFTs, he is likely slow to improve and will may require nocturnal ventilation for respiratory support --Addressed questions regarding the clinical course of his COPD and likely progression of his symptoms. On triple therapy and oxygen as an outpatient. Would consider adding home nocturnal ventilation for support for his hypercarbic  respiratory failure. Ohtuvayre  was previously considered however financial limitations prevented him from obtaining but due to his bullous emphysema, this may not be an effective option for treatment.  P: -Transition back to home  Breztri  today  -Scheduled duonebs changed to TID, also has PRN  -Prednisone  60 mg daily for 3 days then taper by 10 mg every 3 days then stop --60 mg daily x 3 days  --50 mg daily x 3 days,  --40 mg daily x 3 days, --30 mg daily x 3 days, --20 mg daily x 3 days, --10 mg daily x 3 days then stop  -Wean supplemental oxygen as able w/ goal SpO2 88-92% -Continue BiPAP overnight, may benefit from this at discharge -Will need f/u w/ Dr. Darlean (pulmonology) in 2 weeks outpatient -Aspiration precautions -Mobilize as able -Encourage adequate and frequent pulmonary hygiene -PMT following, plan outpatient palliative care  Pulmonary will continue to follow.   Critical care time: NA    Ozell Nearing, DO Internal Medicine Resident PGY-3  Personal contact information can be found on Amion  If no contact or response made please call [phone] 04/24/2024, 10:22 AM    "

## 2024-04-24 NOTE — Plan of Care (Signed)
" °  Problem: Education: Goal: Knowledge of General Education information will improve Description: Including pain rating scale, medication(s)/side effects and non-pharmacologic comfort measures Outcome: Progressing   Problem: Health Behavior/Discharge Planning: Goal: Ability to manage health-related needs will improve Outcome: Progressing   Problem: Clinical Measurements: Goal: Respiratory complications will improve Outcome: Progressing Goal: Cardiovascular complication will be avoided Outcome: Progressing   Problem: Nutrition: Goal: Adequate nutrition will be maintained Outcome: Progressing   Problem: Elimination: Goal: Will not experience complications related to bowel motility Outcome: Progressing Goal: Will not experience complications related to urinary retention Outcome: Progressing   Problem: Pain Managment: Goal: General experience of comfort will improve and/or be controlled Outcome: Progressing   Problem: Education: Goal: Knowledge of disease or condition will improve Outcome: Progressing Goal: Knowledge of the prescribed therapeutic regimen will improve Outcome: Progressing   Problem: Respiratory: Goal: Ability to maintain a clear airway will improve Outcome: Progressing Goal: Levels of oxygenation will improve Outcome: Progressing Goal: Ability to maintain adequate ventilation will improve Outcome: Progressing   "

## 2024-04-24 NOTE — TOC Progression Note (Signed)
 Transition of Care [facility]) - Progression Note    Patient Details  Name: Timothy [name] MRN: [id number] Date of Birth: 1955/05/19  [facility] CM/SW Contact  [name], RN Phone Number: 04/24/2024, 2:42 PM  Clinical Narrative:     Order for home bipap. Notified Mitch with [employer] of DME order.   Expected Discharge Plan: Home w Home Health Services                 Expected Discharge Plan and Services   Discharge Planning Services: CM Consult Post Acute Care Choice: Durable Medical Equipment, Home Health Living arrangements for the past 2 months: Single Family Home                 DME Arranged: Walker rolling with seat, Oxygen DME Agency: [facility] Date DME Agency Contacted: 04/20/24 Time DME Agency Contacted: [phone] Representative spoke with at DME Agency: [name] HH Arranged: OT, PT HH Agency: [facility] Date HH Agency Contacted: 04/20/24 Time HH Agency Contacted: 1514 Representative spoke with at [facility] Agency: [name]   Social Drivers of Health (SDOH) Interventions SDOH Screenings   Food Insecurity: No Food Insecurity (04/19/2024)  Housing: Low Risk (04/19/2024)  Transportation Needs: No Transportation Needs (04/19/2024)  Utilities: Not At Risk (04/19/2024)  Alcohol Screen: Low Risk (06/29/2023)  Depression (PHQ2-9): Low Risk (02/25/2024)  Financial Resource Strain: Medium Risk (02/25/2023)  Physical Activity: Inactive (06/29/2023)  Social Connections: Moderately Isolated (04/19/2024)  Stress: No Stress Concern Present (06/29/2023)  Tobacco Use: Medium Risk (04/19/2024)  Health Literacy: Adequate Health Literacy (06/29/2023)    Readmission Risk Interventions     No data to display

## 2024-04-24 NOTE — Progress Notes (Signed)
 Occupational Therapy Treatment Patient Details Name: Timothy [name] MRN: [id number] DOB: 09/10/1955 Today's Date: 04/24/2024   History of present illness Pt is a 68 y.o male admitted 12/16 for N/V/D. Hypertensive in ED, admitted for COPD exacerbation. PMH: COPD, HTN, cardiac arrest, NSTEMI, CAD, HLD   OT comments  Pt making steady progress towards OT goals this session. Pt continues to present with decreased activity tolerance and generalized weakness . Session focus on improving overall activity tolerance for ADL participation. Pt currently requires set- up for UB ADLS, CGA for ambulatory ADL transfers with rollator and CGA for sit>stands. Pt able to ambulate ~ 100 ft with rollator and CGA. Pt needed extended seated rest break at half way point d/t fatigue. Pt initially on 6L HFNC but able to drop pt to 4L HFNC with SpO2 briefly dropping to 88% but able to rebound to > 90% within 30 secs. Anticipate pt will continue to progress well at home with assistance from his girl friend and grandson, do not anticipate any OT f/u. Will continue to follow acutely.        If plan is discharge home, recommend the following:  A little help with walking and/or transfers;A little help with bathing/dressing/bathroom;Assistance with cooking/housework   Equipment Recommendations  Tub/shower seat    Recommendations for Other Services      Precautions / Restrictions Precautions Precautions: Fall;Other (comment) Recall of Precautions/Restrictions: Intact Precaution/Restrictions Comments: watch SpO2 (on 2-3L O2 PRN at baseline) Restrictions Weight Bearing Restrictions Per Provider Order: No       Mobility Bed Mobility   Bed Mobility: Supine to Sit     Supine to sit: HOB elevated, Supervision     General bed mobility comments: for safety    Transfers Overall transfer level: Needs assistance Equipment used: Rollator (4 wheels) Transfers: Sit to/from Stand Sit to Stand: Contact guard assist            General transfer comment: CGA for safety with lines no physical assist needed     Balance Overall balance assessment: Needs assistance Sitting-balance support: No upper extremity supported, Feet supported Sitting balance-Leahy Scale: Good     Standing balance support: Bilateral upper extremity supported, Reliant on assistive device for balance, During functional activity   Standing balance comment: Benefits from BUE support                           ADL either performed or assessed with clinical judgement   ADL Overall ADL's : Needs assistance/impaired         Upper Body Bathing: Set up;Sitting Upper Body Bathing Details (indicate cue type and reason): simulated via UB dressing     Upper Body Dressing : Set up;Sitting Upper Body Dressing Details (indicate cue type and reason): to don back side cover     Toilet Transfer: Contact guard assist;Ambulation;Rollator (4 wheels) Toilet Transfer Details (indicate cue type and reason): simulated via functional ambulation greater than a household distance       Tub/Shower Transfer Details (indicate cue type and reason): pt reports tub shower at home, recommended shower seat for econ Functional mobility during ADLs: Contact guard assist;Rollator (4 wheels) General ADL Comments: ADL participation limitey by decreased activity tolerance    Extremity/Trunk Assessment Upper Extremity Assessment Upper Extremity Assessment: Generalized weakness   Lower Extremity Assessment Lower Extremity Assessment: Generalized weakness   Cervical / Trunk Assessment Cervical / Trunk Assessment: Normal    Vision Baseline Vision/History: 0 No visual  deficits Vision Assessment?: No apparent visual deficits   Perception Perception Perception: Not tested   Praxis Praxis Praxis: Not tested   Communication Communication Communication: No apparent difficulties   Cognition Arousal: Alert Behavior During Therapy: WFL for tasks  assessed/performed Cognition: No apparent impairments                               Following commands: Intact        Cueing   Cueing Techniques: Verbal cues  Exercises      Shoulder Instructions       General Comments pt started session on 6L HFNC, able to titrate down to 4L HFNC with SpO2 briefly dropping to 88% but able to rebound to 91% within 30 secs. education provided on energy conservation strategies and DME for shower, pt able to report appropriate SPO2 reading for home and reports he has a pulse ox at home. pt continues to endorse living with his gf and grandson who can physically assist pt as needed.    Pertinent Vitals/ Pain       Pain Assessment Pain Assessment: No/denies pain  Home Living                                          Prior Functioning/Environment              Frequency  Min 2X/week        Progress Toward Goals  OT Goals(current goals can now be found in the care plan section)  Progress towards OT goals: Progressing toward goals  Acute Rehab OT Goals Patient Stated Goal: to go home OT Goal Formulation: With patient Time For Goal Achievement: 05/04/24 Potential to Achieve Goals: Good  Plan      Co-evaluation                 AM-PAC OT 6 Clicks Daily Activity     Outcome Measure   Help from another person eating meals?: None Help from another person taking care of personal grooming?: A Little Help from another person toileting, which includes using toliet, bedpan, or urinal?: A Little Help from another person bathing (including washing, rinsing, drying)?: A Little Help from another person to put on and taking off regular upper body clothing?: None Help from another person to put on and taking off regular lower body clothing?: A Little 6 Click Score: 20    End of Session Equipment Utilized During Treatment: Gait belt;Rollator (4 wheels);Oxygen;Other (comment) (4- 6 L HFNC)  OT Visit  Diagnosis: Unsteadiness on feet (R26.81);Muscle weakness (generalized) (M62.81);Other abnormalities of gait and mobility (R26.89)   Activity Tolerance Patient tolerated treatment well   Patient Left in chair;with call bell/phone within reach;with chair alarm set   Nurse Communication Mobility status;Other (comment) (decreased down to 4L, in recliner)        Time: 9194-9164 OT Time Calculation (min): 30 min  Charges: OT General Charges $OT Visit: 1 Visit OT Treatments $Self Care/Home Management : 23-37 mins  Ronal Mallie POUR., COTA/L Acute Rehabilitation Services [phone]   Ronal Mallie Needy 04/24/2024, 8:51 AM

## 2024-04-24 NOTE — Progress Notes (Signed)
 Physical Therapy Treatment Patient Details Name: Timothy [name] MRN: [id number] DOB: April 30, 1956 Today's Date: 04/24/2024   History of Present Illness Pt is a 68 y.o male admitted 12/16 for N/V/D. Hypertensive in ED, admitted for COPD exacerbation. PMH: COPD, HTN, cardiac arrest, NSTEMI, CAD, HLD    PT Comments  On initial visit pt reporting he can not work with therapy until he has his inhaler. RN present with inhaler, but pt asks for a few minutes for the medication to work. When PT returns pt is agreeable to walk with therapy. Pt on 6L O2 via HFNC. Pt with 2 bouts of ambulation with Rollator and supervision. Ambulates on 6L O2 via HFNC, with second bout of ambulation pt able to ambulate on 4L O2 via HFNC while maintaining SpO2 >88%O2. Pt hopeful for d/c tomorrow. Plans remain appropriate. PT will continue to follow acutely.     If plan is discharge home, recommend the following: A little help with bathing/dressing/bathroom;Assistance with cooking/housework;Assist for transportation   Can travel by private vehicle      Yes  Equipment Recommendations  Rollator (4 wheels);Other (comment) (tub bench/shower seat)       Precautions / Restrictions Precautions Precautions: Fall;Other (comment) Recall of Precautions/Restrictions: Intact Precaution/Restrictions Comments: watch SpO2 (on 2-3L O2 PRN at baseline) Restrictions Weight Bearing Restrictions Per Provider Order: No     Mobility   Transfers Overall transfer level: Needs assistance Equipment used: Rollator (4 wheels), None Transfers: Sit to/from Stand, Bed to chair/wheelchair/BSC Sit to Stand: Contact guard assist           General transfer comment: CGA for safety, pt with good break managment, with initial power up and subsequent seated rest break    Ambulation/Gait Ambulation/Gait assistance: Contact guard assist Gait Distance (Feet): 150 Feet (+75) Assistive device: Rollator (4 wheels) Gait Pattern/deviations:  Step-through pattern, Decreased step length - right, Decreased step length - left, Decreased stride length Gait velocity: reduced Gait velocity interpretation: <1.8 ft/sec, indicate of risk for recurrent falls   General Gait Details: Pt with 2/4 DoE at beginning of ambulation, and 4/4 DoE by the time he returned to outside his room. Pt able to sit in Rollator for ~5 min to recover and was able to ambulate an additional 75 feet.       Balance Overall balance assessment: Needs assistance Sitting-balance support: No upper extremity supported, Feet supported Sitting balance-Leahy Scale: Good     Standing balance support: Bilateral upper extremity supported, Reliant on assistive device for balance, During functional activity Standing balance-Leahy Scale: Poor                              Communication Communication Communication: No apparent difficulties  Cognition Arousal: Alert Behavior During Therapy: WFL for tasks assessed/performed                             Following commands: Intact      Cueing Cueing Techniques: Verbal cues     General Comments General comments (skin integrity, edema, etc.): pt on 6L O2 via HFNC, pt reporting that he would be doing better if he had gotten his inhaler in a more timely manner. Pt able to ambule on 6L O2 and maintain SpO2 >92%O2, with brief dip to 90%SpO2 on intial sitting, cues for purse lip breathing and rebounded. After seated rest break pt able to ambulate on 4L O2 via HFNC with  SpO2 >90%O2, with sitting SpO2 drops to 88%O2 but pt able to recover with purse lip breathing Pt left on 4L O2 via [place] with SpO2 93%O2      Pertinent Vitals/Pain Pain Assessment Pain Assessment: No/denies pain     PT Goals (current goals can now be found in the care plan section) Acute Rehab PT Goals Patient Stated Goal: to be able to breathe better Progress towards PT goals: Progressing toward goals    Frequency    Min  2X/week       AM-PAC PT 6 Clicks Mobility   Outcome Measure  Help needed turning from your back to your side while in a flat bed without using bedrails?: A Little Help needed moving from lying on your back to sitting on the side of a flat bed without using bedrails?: A Little Help needed moving to and from a bed to a chair (including a wheelchair)?: A Little Help needed standing up from a chair using your arms (e.g., wheelchair or bedside chair)?: A Little Help needed to walk in hospital room?: A Little Help needed climbing 3-5 steps with a railing? : A Little 6 Click Score: 18    End of Session Equipment Utilized During Treatment: Oxygen Activity Tolerance: Patient tolerated treatment well Patient left: in chair;with call bell/phone within reach;with chair alarm set Nurse Communication: Mobility status PT Visit Diagnosis: Unsteadiness on feet (R26.81);Other abnormalities of gait and mobility (R26.89);Muscle weakness (generalized) (M62.81);Difficulty in walking, not elsewhere classified (R26.2)     Time: 8469-8450 PT Time Calculation (min) (ACUTE ONLY): 19 min  Charges:    $Gait Training: 8-22 mins PT General Charges $$ ACUTE PT VISIT: 1 Visit                     Timothy [name] PT, DPT Acute Rehabilitation Services Please use secure chat or  Call Office [phone]    Timothy [name] 04/24/2024, 4:11 PM

## 2024-04-24 NOTE — Progress Notes (Signed)
" °   04/24/24 2333  BiPAP/CPAP/SIPAP  $ Non-Invasive Ventilator  Non-Invasive Vent Subsequent  BiPAP/CPAP/SIPAP Pt Type Adult  BiPAP/CPAP/SIPAP SERVO  Mask Type Full face mask  Dentures removed? Not applicable  Mask Size Medium  Respiratory Rate 27 breaths/min  IPAP 10 cmH20  EPAP 5 cmH2O  Pressure Support 5 cmH20  PEEP 5 cmH20  FiO2 (%) 40 %  Minute Ventilation 11.2  Leak 80  Peak Inspiratory Pressure (PIP) 8  Tidal Volume (Vt) 729  Patient Home Machine No  Patient Home Mask No  Patient Home Tubing No  Auto Titrate No  Press High Alarm 25 cmH2O  Device Plugged into RED Power Outlet Yes    "

## 2024-04-25 ENCOUNTER — Telehealth (HOSPITAL_BASED_OUTPATIENT_CLINIC_OR_DEPARTMENT_OTHER): Payer: Self-pay | Admitting: Pulmonary Disease

## 2024-04-25 DIAGNOSIS — Z8551 Personal history of malignant neoplasm of bladder: Secondary | ICD-10-CM | POA: Diagnosis not present

## 2024-04-25 DIAGNOSIS — J9621 Acute and chronic respiratory failure with hypoxia: Secondary | ICD-10-CM | POA: Diagnosis not present

## 2024-04-25 DIAGNOSIS — J441 Chronic obstructive pulmonary disease with (acute) exacerbation: Secondary | ICD-10-CM | POA: Diagnosis not present

## 2024-04-25 DIAGNOSIS — I25118 Atherosclerotic heart disease of native coronary artery with other forms of angina pectoris: Secondary | ICD-10-CM | POA: Diagnosis not present

## 2024-04-25 NOTE — Progress Notes (Signed)
 " Progress Note   Patient: Timothy [name] DOB: 1955/05/28 DOA: 04/18/2024     7 DOS: the patient was seen and examined on 04/25/2024   Brief hospital course: Timothy [name] is a 68 y.o. male with medical history significant for HTN, cardiac arrest vfib/vtach 06/2013, NSTEMI/CAD with stent to RCA, COPD Gold stage III, chronic hypoxic respiratory failure on 2 L at night and as needed, former smoker, prediabetes, pulmonary nodule, GERD, HLD, Elev PSA (neg prostate bx 07/2022) and bladder cancer who presented via EMS for evaluation of shortness of breath, nausea vomiting and diarrhea.  Patient is admitted to hospitalist service for acute on chronic hypoxic respiratory failure, COPD exacerbation.  Assessment and Plan: Acute on chronic hypoxic respiratory failure COPD exacerbation- Patient had worse shortness of breath, wheezing and productive cough, hypoxia requiring 7 L supplemental oxygen upon presentation. He was placed on bipap 12/18 and 12/19 night, got ativan  for anxiety. Pulmonology follow up appreciated. Brreztri resumed. IV Solu-Medrol  60mg  q daily transitioned to oral prednisone  per PCCM. Continue DuoNebs Q4 hourly, finished azithromycin . Continue respiratory toilet, antitussive medication. Continue to wean bipap to baseline 2 L. TOC working on nocturnal PAP device. Morphine , ativan  PRN order placed, he feels anxious. Palliative medicine consulted- he wishes outpatient palliative care. HHPT ordered.  Abnormal urinalysis- Urine cultures insignificant growth. Stopped Rocephin  therapy.  Acute diarrhea, nausea, vomiting- C. difficile studies negative. Diarrhea improved. Continue antiemetics, stopped IV fluids.  Hypertension: Continue home dose amlodipine , bisoprolol .  CAD status post RCA stent: Continue home dose Brilinta , statin.  History of bladder cancer status post TURP: Urology follow-up as outpatient.  GERD on Protonix .    Out of bed to chair. Incentive  spirometry. Nursing supportive care. Fall, aspiration precautions. Diet:  Diet Orders (From admission, onward)     Start     Ordered   04/18/24 2251  Diet regular Room service appropriate? Yes; Fluid consistency: Thin  Diet effective now       Question Answer Comment  Room service appropriate? Yes   Fluid consistency: Thin      04/18/24 2255           DVT prophylaxis: enoxaparin  (LOVENOX ) injection 40 mg Start: 04/19/24 1000  Level of care: Progressive   Code Status: Full Code  Subjective: Patient is seen and examined today morning. He is on 3-4L supplemental oxygen, feels weak, anxious today. Eating poor, did not work with PT.  Physical Exam: Vitals:   04/25/24 0811 04/25/24 0844 04/25/24 1136 04/25/24 1559  BP: 115/77 (!) 152/73 119/73 113/71  Pulse: (!) 115 (!) 111 99 96  Resp: 20 (!) 27 20 (!) 22  Temp: 98.7 F (37.1 C)  98.2 F (36.8 C) 98.6 F (37 C)  TempSrc: Oral  Oral Oral  SpO2: 93% 94% 97% 95%  Weight:      Height:        General - Elderly Caucasian thin built anxious male, mild respiratory distress HEENT - PERRLA, EOMI, atraumatic head, non tender sinuses. Lung - decreased air entry, diffuse rhonchi, wheezes. Not on bipap Heart - S1, S2 heard, no murmurs, rubs, no pedal edema. Abdomen - Soft, non tender, distended, bowel sounds good Neuro - Alert, awake and oriented x 3, non focal exam. Skin - Warm and dry.  Data Reviewed:      Latest Ref Rng & Units 04/22/2024    4:47 AM 04/21/2024    3:11 AM 04/19/2024    5:18 AM  CBC  WBC 4.0 - 10.5  K/uL 13.3  18.9  9.2   Hemoglobin 13.0 - 17.0 g/dL 85.8  85.0  84.4   Hematocrit 39.0 - 52.0 % 42.4  44.7  45.9   Platelets 150 - 400 K/uL 305  350  346       Latest Ref Rng & Units 04/22/2024    4:47 AM 04/21/2024    3:11 AM 04/19/2024    5:18 AM  BMP  Glucose 70 - 99 mg/dL 864  890  830   BUN 8 - 23 mg/dL 18  16  10    Creatinine 0.61 - 1.24 mg/dL 9.45  9.47  9.28   Sodium 135 - 145 mmol/L 138  141   139   Potassium 3.5 - 5.1 mmol/L 4.7  4.0  3.7   Chloride 98 - 111 mmol/L 99  101  101   CO2 22 - 32 mmol/L 31  30  25    Calcium  8.9 - 10.3 mg/dL 9.0  9.3  9.1    No results found.    Family Communication: Discussed with patient, he understand and agree. All questions answered.  Disposition: Status is: Inpatient Remains inpatient appropriate because: hypoxia, oral steroids, wean o2, HHPT, PAP device at home.  Planned Discharge Destination: Home with HH, PAP device     Time spent: 51 minutes  Author: Concepcion Riser, MD 04/25/2024 5:23 PM Secure chat 7am to 7pm For on call review [email or web address].    "

## 2024-04-25 NOTE — Progress Notes (Signed)
" ° °  Palliative Medicine Inpatient Follow Up Note HPI: Timothy [name] is a 68 y.o. male with medical history significant for HTN, cardiac arrest vfib/vtach 06/2013, NSTEMI/CAD with stent to RCA, COPD Gold stage III, chronic hypoxic respiratory failure on 2 L at night and as needed, former smoker, prediabetes, pulmonary nodule, GERD, HLD, Elev PSA (neg prostate bx 07/2022) and bladder cancer who presented via EMS for evaluation of shortness of breath, nausea vomiting and diarrhea.  Palliative care has been asked to support additional goals of care conversations in the setting of advanced pulmonary disease.   Today's Discussion 04/25/2024  *Please note that this is a verbal dictation therefore any spelling or grammatical errors are due to the [employer] system interpretation.  I reviewed the chart notes including nursing notes from today, progress notes from today. I also reviewed vital signs, nursing flowsheets, medication administrations record, labs, and imaging.    Oral Intake %:  80% I/O:  (-) Bowel Movements:  Last 12/19 Mobility: Able to mobilize with O2  I met with [name] at bedside this morning. He is awake and alert. He shares that he slept poorly last night and is generally not feeling well today. Gently brought to light patients significant disease burden. He shares not desiring further conversations related to goals this morning. I let [name] know that I would be present in the hospital if he decides he would like further conversation.   Questions and concerns addressed/Palliative Support Provided.   Objective Assessment: Vital Signs Vitals:   04/25/24 0811 04/25/24 0844  BP: 115/77 (!) 152/73  Pulse: (!) 115 (!) 111  Resp: 20 (!) 27  Temp: 98.7 F (37.1 C)   SpO2: 93% 94%    Intake/Output Summary (Last 24 hours) at 04/25/2024 1114 Last data filed at 04/25/2024 0600 Gross per 24 hour  Intake 480 ml  Output 1200 ml  Net -720 ml   Last Weight  Most recent  update: 04/19/2024 11:25 AM    Weight  54.8 kg (120 lb 13 oz)            Gen: Elderly Caucasian male chronically ill in appearance HEENT: moist mucous membranes CV: Regular rate and rhythm PULM: On 6 L high flow nasal cannula breathing is slightly labored. ABD: soft/nontender EXT: No edema Neuro: Alert and oriented x3  SUMMARY OF RECOMMENDATIONS   Full code/full scope of care   Discussed the importance of advanced directives and provided patient --> a blank copy for review and completion which [name] plans to look over when able. He shares an inability to think about this over the past two days   OP Palliative support on discharge   Ongoing palliative care support as during hospitalization   Symptoms: Dyspnea: - Low-dose morphine  2.5 mg every 4 hours as needed ______________________________________________________________________________________ [name] Palliative Medicine Team Team Cell Phone: [phone] Please utilize secure chat with additional questions, if there is no response within 30 minutes please call the above phone number  Time Spent: 25  Palliative Medicine Team providers are available by phone from 7am to 7pm daily and can be reached through the team cell phone.  Should this patient require assistance outside of these hours, please call the patient's attending physician.     "

## 2024-04-25 NOTE — Consult Note (Addendum)
 "  NAME:  Timothy [name], MRN:  [id number], DOB:  [birth year], LOS: 7 ADMISSION DATE:  04/18/2024, CONSULTATION DATE:  04/22/24 REFERRING MD:  Concepcion Riser MD CHIEF COMPLAINT:  COPD exacerbation   History of Present Illness:  68 year old male former smoker (quit in 2010) with pertinent medical history of COPD, nocturnal respiratory failure on 2L who presented for SOB, wheezing, N/V/D and admitted on 04/18/24 for COPD exacerbation with increased O2 requirement to 7L and requiring BiPAP. CXR neg for infiltrate or effusion or edema. Started on rocephin , azithmycin, steroids, mag and nebulizers. Neg for covid, RSV and flu. C.diff negative. Initial O2 requirement improved to 4L however overnight on 12/19 had worsening shortness of breath and hypoxemia and given ativan  for BiPAP tolerance. Continued to have increased WOB that same evening and requiring BiPAP again. Solumedrol was repeated and repeat CXR with no acute changes. VBG 7.42/56/104. Patient had some confusion and persistent wheezing that was treated with Duoneb. Early in the morning of 12/20 patient in respiratory distress and confused after found with BiPAP mask removed by patient. Repeat ABG 7.36/64/223/36. D dimer neg. PCCM consulted for recommendations.  Prior studies Followed by Dr. Darlean at [facility]. On Breztri . Unable to obtain ohtuvayre  due to deductible. Last visit in 11/2023  PFT 07/01/20 FVC 2.80 (72%) FEV1 1.35 (46%) Ratio 48  TLC 108% RV 174% DLCO 40% Interpretation: Severe obstructive defect with air trapping and moderately reduced gas exchange consistent with emphysema  CT Chest lung scree 06/15/23 Bullous emphysema, subcentimeter stable calcified and noncalcified pulmonary nodules  Pertinent  Medical History  Emphysema, COPD, chronic respiratory failure on 2L, CAD, hx cardiac arrest 2015 s/p PCI to RCA, HTN, HLD, bladder cancer s/p TURB, GERD  Significant Hospital Events: Including procedures, antibiotic start and  stop dates in addition to other pertinent events     Interim History / Subjective:  Improved to 3L O2 Wearing BiPAP nightly Objective    Blood pressure 119/73, pulse 99, temperature 98.2 F (36.8 C), temperature source Oral, resp. rate 20, height 5' 6 (1.676 m), weight 54.8 kg, SpO2 97%.    FiO2 (%):  [40 %] 40 % PEEP:  [5 cmH20] 5 cmH20 Pressure Support:  [5 cmH20] 5 cmH20   Intake/Output Summary (Last 24 hours) at 04/25/2024 1438 Last data filed at 04/25/2024 1300 Gross per 24 hour  Intake 480 ml  Output 1200 ml  Net -720 ml   Filed Weights   04/18/24 1919 04/19/24 1112  Weight: 56.7 kg 54.8 kg   Physical Exam: General: Chronically ill-appearing, no acute distress HENT: [place], AT, OP clear, MMM Eyes: EOMI, no scleral icterus Respiratory: Diminished to auscultation bilaterally.  No crackles, wheezing or rales Cardiovascular: RRR, -M/R/G, no JVD GI: BS+, soft, nontender Extremities:-Edema,-tenderness Neuro: AAO x4, CNII-XII grossly intact Psych: Normal mood, normal affect  CXR 04/21/24 - Hyperinflation. No infiltrate effusion or edema WBC improving 13.3 CO2 31 ABG and VBG with compensated chronic hypercarbic respiratory failure  Assessment and Plan   Severe bullous COPD with emphysema COPD exacerbation Acute on chronic hypoxemic respiratory failure Chronic hypercarbic respiratory failure --Given the severity of his chronic lung disease and presumed progression from severe status on 2022 PFTs, he is likely slow to improve and will require nocturnal ventilation for respiratory support. --Addressed questions regarding the clinical course of his COPD and likely progression of his symptoms. On triple therapy and oxygen as an outpatient. Would consider adding home nocturnal ventilation for support for his hypercarbic respiratory failure. Ohtuvayre  was  previously considered however financial limitations prevented him from obtaining but due to his bullous emphysema, this may  not be an effective option for treatment. -  --Continue Breztri  --Duonebs TID --Prednisone  taper. Started at 60 mg daily. Decrease by 10 mg every 3 days. --Appreciate Palliative input. Morphine  2.5 mg q4h as needed. Patient confirmed full code --Wean O2 for goal >88%. Near baseline. --Will arrange follow-up with his primary pulmonologist in 2 weeks --Ambulatory O2 prior to discharge to determine home O2 needs  Pulmonary will see again on 12/25 if patient remains inpatient  Labs   CBC: Recent Labs  Lab 04/18/24 1940 04/18/24 1943 04/19/24 0518 04/21/24 0311 04/22/24 0447  WBC 22.3*  --  9.2 18.9* 13.3*  NEUTROABS 17.3*  --   --  15.1* 11.7*  HGB 17.7* 18.7* 15.5 14.9 14.1  HCT 52.9* 55.0* 45.9 44.7 42.4  MCV 91.0  --  90.2 91.0 90.8  PLT 406*  --  346 350 305    Basic Metabolic Panel: Recent Labs  Lab 04/18/24 1940 04/18/24 1943 04/19/24 0518 04/21/24 0311 04/22/24 0447  NA 141 140 139 141 138  K 3.9 3.9 3.7 4.0 4.7  CL 98 98 101 101 99  CO2 28  --  25 30 31   GLUCOSE 190* 194* 169* 109* 135*  BUN 11 12 10 16 18   CREATININE 0.80 0.90 0.71 0.52* 0.54*  CALCIUM  9.8  --  9.1 9.3 9.0  MG  --   --   --  2.6* 2.4  PHOS  --   --   --   --  2.7   GFR: Estimated Creatinine Clearance: 69.5 mL/min (A) (by C-G formula based on SCr of 0.54 mg/dL (L)). Recent Labs  Lab 04/18/24 1940 04/18/24 1943 04/18/24 2224 04/19/24 0518 04/21/24 0311 04/22/24 0447  PROCALCITON  --   --   --   --   --  <0.10  WBC 22.3*  --   --  9.2 18.9* 13.3*  LATICACIDVEN  --  2.3* 1.7  --   --   --     Liver Function Tests: Recent Labs  Lab 04/18/24 1940 04/21/24 0311 04/22/24 0447  AST 27 26 21   ALT 28 22 29   ALKPHOS 115 132* 88  BILITOT 0.5 0.6 0.5  PROT 8.2* 6.7 6.2*  ALBUMIN 4.5 3.9 3.7   No results for input(s): LIPASE, AMYLASE in the last 168 hours. No results for input(s): AMMONIA in the last 168 hours.  ABG    Component Value Date/Time   PHART 7.36 04/22/2024 0635    PCO2ART 64 (H) 04/22/2024 0635   PO2ART 223 (H) 04/22/2024 0635   HCO3 36.4 (H) 04/22/2024 0635   TCO2 32 04/18/2024 1943   ACIDBASEDEF 4.6 (H) 06/12/2013 0627   O2SAT 99.5 04/22/2024 0635     Coagulation Profile: Recent Labs  Lab 04/18/24 1940  INR 1.0    Cardiac Enzymes: No results for input(s): CKTOTAL, CKMB, CKMBINDEX, TROPONINI in the last 168 hours.  HbA1C: HbA1c, POC (prediabetic range)  Date/Time Value Ref Range Status  06/28/2023 02:14 PM 6.0 5.7 - 6.4 % Final  06/25/2022 11:59 AM 5.9 5.7 - 6.4 % Final   HbA1c, POC (controlled diabetic range)  Date/Time Value Ref Range Status  02/25/2024 02:34 PM 5.8 0.0 - 7.0 % Final   Hgb A1c MFr Bld  Date/Time Value Ref Range Status  09/07/2022 01:33 PM 5.8 (H) 4.8 - 5.6 % Final    Comment:    (NOTE) Pre  diabetes:          5.7%-6.4%  Diabetes:              >6.4%  Glycemic control for   <7.0% adults with diabetes     CBG: Recent Labs  Lab 04/22/24 0542  GLUCAP 136*    Review of Systems:   Review of Systems  Constitutional:  Negative for chills, diaphoresis, fever, malaise/fatigue and weight loss.  HENT:  Negative for congestion.   Respiratory:  Negative for cough, hemoptysis, sputum production, shortness of breath and wheezing.   Cardiovascular:  Negative for chest pain, palpitations and leg swelling.     Past Medical History:  He,  has a past medical history of Altered mental status, improved at discharge (06/19/2013), Arthritis, Bladder cancer (HCC) (2018), Bronchitis, chronic (HCC) (06/19/2013), CAD (coronary artery disease), Cancer (HCC), Cardiac arrest, hypothermic protocol (06/12/2013), COPD exacerbation (HCC) (06/12/2013), GERD (gastroesophageal reflux disease), leukocytosis, Hyperlipidemia LDL goal < 70 (06/19/2013), Hypokalemia, replaced (06/19/2013), Hypomagnesemia, replaced (06/19/2013), Hypotension, requiring levophed  (06/19/2013), NSTEMI (non-ST elevated myocardial infarction), DES to RCA  (06/13/2013), Pre-diabetes, and Tobacco abuse (06/19/2013).   Surgical History:   Past Surgical History:  Procedure Laterality Date   cardiac sstent     COLONOSCOPY WITH PROPOFOL  N/A 11/12/2022   Procedure: COLONOSCOPY WITH PROPOFOL ;  Surgeon: Abran Norleen SAILOR, MD;  Location: [facility];  Service: Gastroenterology;  Laterality: N/A;   CORONARY ANGIOPLASTY WITH STENT PLACEMENT  06/12/2013   Xience Alpine DES to RCA, NSTEMI   CORONARY STENT INTERVENTION N/A 02/24/2020   Procedure: CORONARY STENT INTERVENTION;  Surgeon: Claudene Victory ORN, MD;  Location: [facility];  Service: Cardiovascular;  Laterality: N/A;   CORONARY/GRAFT ACUTE MI REVASCULARIZATION N/A 02/24/2020   Procedure: Coronary/Graft Acute MI Revascularization;  Surgeon: Claudene Victory ORN, MD;  Location: [facility];  Service: Cardiovascular;  Laterality: N/A;   CYSTOSCOPY W/ RETROGRADES Left 03/29/2017   Procedure: CYSTOSCOPY WITH LEFT RETROGRADE PYELOGRAM;  Surgeon: Ottelin, Mark, MD;  Location: [facility];  Service: Urology;  Laterality: Left;   CYSTOSCOPY WITH DIRECT VISION INTERNAL URETHROTOMY N/A 09/15/2022   Procedure: CYSTOSCOPY WITH BALLOON DILATION;  Surgeon: Shona Layman BROCKS, MD;  Location: [facility];  Service: Urology;  Laterality: N/A;   LEFT HEART CATH AND CORONARY ANGIOGRAPHY N/A 02/24/2020   Procedure: LEFT HEART CATH AND CORONARY ANGIOGRAPHY;  Surgeon: Claudene Victory ORN, MD;  Location: [facility];  Service: Cardiovascular;  Laterality: N/A;   LEFT HEART CATHETERIZATION WITH CORONARY ANGIOGRAM N/A 06/12/2013   Procedure: LEFT HEART CATHETERIZATION WITH CORONARY ANGIOGRAM;  Surgeon: Debby DELENA Sor, MD;  Location: [facility] LAB;  Service: Cardiovascular;  Laterality: N/A;   POLYPECTOMY  11/12/2022   Procedure: POLYPECTOMY;  Surgeon: Abran Norleen SAILOR, MD;  Location: [facility];  Service: Gastroenterology;;   TRANSURETHRAL RESECTION OF BLADDER TUMOR N/A 03/29/2017   Procedure: TRANSURETHRAL RESECTION OF BLADDER TUMOR  (TURBT)/;  Surgeon: Ceil Anes, MD;  Location: [facility];  Service: Urology;  Laterality: N/A;   transurethral ressection of bladder tumor     Dr. Ottelin 03/29/17     Social History:   reports that he quit smoking about 10 years ago. His smoking use included cigarettes. He started smoking about 48 years ago. He has a 76 pack-year smoking history. He has never used smokeless tobacco. He reports that he does not drink alcohol and does not use drugs.   Family History:  His family history includes Hypertension in an other family member. There is no history of  Colon cancer, Stomach cancer, or Esophageal cancer.   Allergies Allergies[1]   Home Medications  Prior to Admission medications  Medication Sig Start Date End Date Taking? Authorizing Provider  acetaminophen  (TYLENOL ) 500 MG tablet Take 500-1,000 mg by mouth every 8 (eight) hours as needed for mild pain or headache.    Yes [provider]  albuterol  (VENTOLIN  HFA) 108 (90 Base) MCG/ACT inhaler Inhale 2 puffs into the lungs every 6 (six) hours as needed for wheezing or shortness of breath. 10/26/22  Yes [name], MD  amLODipine  (NORVASC ) 5 MG tablet Take 1 tablet (5 mg total) by mouth daily. 10/26/23  Yes [name], MD  aspirin  EC 81 MG tablet Take 1 tablet (81 mg total) by mouth daily. 10/21/15  Yes Langeland, Dawn T, MD  atorvastatin  (LIPITOR ) 80 MG tablet Take 1 tablet (80 mg total) by mouth daily. 10/26/23  Yes [name], MD  bisoprolol  (ZEBETA ) 5 MG tablet Take 1 tablet (5 mg total) by mouth in the morning. 12/29/23  Yes Tolia, Sunit, DO  BREZTRI  AEROSPHERE 160-9-4.8 MCG/ACT AERO INHALE 2 PUFFS INTO THE LUNGS IN THE MORNING AND AT BEDTIME 05/10/23  Yes Darlean Ozell NOVAK, MD  famotidine  (PEPCID ) 20 MG tablet One daily after supper 03/04/23  Yes Darlean Ozell NOVAK, MD  nitroGLYCERIN  (NITROSTAT ) 0.4 MG SL tablet Place 1 tablet (0.4 mg total) under the tongue every 5 (five) minutes as needed for chest pain. 12/16/23   Yes Meng, Hao, PA  pantoprazole  (PROTONIX ) 40 MG tablet Take 1 tablet (40 mg total) by mouth daily. Take 30-60 min before first meal of the day 05/13/23  Yes Darlean Ozell NOVAK, MD  ticagrelor  (BRILINTA ) 60 MG TABS tablet Take 1 tablet (60 mg total) by mouth 2 (two) times daily. Appointment Required For Further Refills [phone] 12/29/23  Yes Tolia, Sunit, DO  finasteride  (PROSCAR ) 5 MG tablet Take 1 tablet (5 mg total) by mouth daily. Patient not taking: Reported on 04/19/2024 07/30/22   Shona Layman BROCKS, MD  OXYGEN Inhale 2 L into the lungs at bedtime.    [provider]     Critical care time:     Care Time: 51 min   Slater Staff, M.D. [facility] 04/25/2024 2:46 PM   See Amion for personal pager For hours between 7 PM to 7 AM, please call Elink for urgent questions           [1]  Allergies Allergen Reactions   Ace Inhibitors Other (See Comments)    Caused wheezing   Aspirin  Swelling and Other (See Comments)    Lips swell- still takes approx 2 times a week in 2021 Low dose ASA is fine, no problems   Ibuprofen Swelling and Other (See Comments)    Lips swell   Losartan  Other (See Comments)    Leg cramps   "

## 2024-04-25 NOTE — Plan of Care (Signed)
" °  Problem: Education: Goal: Knowledge of General Education information will improve Description: Including pain rating scale, medication(s)/side effects and non-pharmacologic comfort measures Outcome: Progressing   Problem: Health Behavior/Discharge Planning: Goal: Ability to manage health-related needs will improve Outcome: Progressing   Problem: Clinical Measurements: Goal: Respiratory complications will improve Outcome: Progressing Goal: Cardiovascular complication will be avoided Outcome: Progressing   Problem: Activity: Goal: Risk for activity intolerance will decrease Outcome: Progressing   Problem: Nutrition: Goal: Adequate nutrition will be maintained Outcome: Progressing   Problem: Elimination: Goal: Will not experience complications related to bowel motility Outcome: Progressing Goal: Will not experience complications related to urinary retention Outcome: Progressing   Problem: Pain Managment: Goal: General experience of comfort will improve and/or be controlled Outcome: Progressing   Problem: Education: Goal: Knowledge of disease or condition will improve Outcome: Progressing   Problem: Respiratory: Goal: Levels of oxygenation will improve Outcome: Progressing   "

## 2024-04-25 NOTE — Progress Notes (Signed)
 OT Cancellation Note  Patient Details Name: LINLEY MOXLEY MRN: [id number] DOB: 1956-04-13   Cancelled Treatment:    Reason Eval/Treat Not Completed: Patient declined, no reason specified (reports fatigued, declining mobility at this time, will f/u later today as schedule permits)  Tadd Holtmeyer K, OTD, OTR/L SecureChat Preferred Acute Rehab [phone]    Laneta POUR Koonce 04/25/2024, 10:10 AM

## 2024-04-25 NOTE — Progress Notes (Signed)
 Physical Therapy Treatment Patient Details Name: Timothy [name] MRN: [id number] DOB: [birth year] Today's Date: 04/25/2024   History of Present Illness Pt is a 68 y.o male admitted 12/16 for N/V/D. Hypertensive in ED, admitted for COPD exacerbation. PMH: COPD, HTN, cardiac arrest, NSTEMI, CAD, HLD    PT Comments  Pt received in recliner. At first declines therapy but upon more discussion he feels that he overdid it with ambulation yesterday and then couldn't sleep last night and has felt poor today. So discussed how he will handle this situation at home and what kinds of mobility he can do on bad days that will not tax him as much. Tolerated seated  and standing balance and light strengthening exercises with 2 mins recovery in between each. SPO2 maintain 92-93% on 3 L HFNC and HR 110-117 bpm. Rec HHPT at d/c.      If plan is discharge home, recommend the following: A little help with bathing/dressing/bathroom;Assistance with cooking/housework;Assist for transportation   Can travel by private vehicle        Equipment Recommendations  Rollator (4 wheels);Other (comment) (tub bench/shower seat)    Recommendations for Other Services       Precautions / Restrictions Precautions Precautions: Fall;Other (comment) Recall of Precautions/Restrictions: Intact Precaution/Restrictions Comments: watch SpO2 (on 2-3L O2 PRN at baseline) Restrictions Weight Bearing Restrictions Per Provider Order: No     Mobility  Bed Mobility               General bed mobility comments: pt received in recliner    Transfers Overall transfer level: Needs assistance Equipment used: Rollator (4 wheels), None Transfers: Sit to/from Stand Sit to Stand: Contact guard assist           General transfer comment: CGA for safety. WOrked on sit to stand with and without use of rollator    Ambulation/Gait             Pre-gait activities: standing marching in place, sit>stand x4, heel raises in  standing     Stairs             Wheelchair Mobility     Tilt Bed    Modified Rankin (Stroke Patients Only)       Balance Overall balance assessment: Needs assistance Sitting-balance support: No upper extremity supported, Feet supported Sitting balance-Leahy Scale: Good     Standing balance support: Bilateral upper extremity supported, Reliant on assistive device for balance, During functional activity Standing balance-Leahy Scale: Fair Standing balance comment: able to maintain balance without UE support but becomes SOB faster, rec UE support for energy conservation               High Level Balance Comments: seated and standing reaching exercises with balloon            Communication Communication Communication: No apparent difficulties  Cognition Arousal: Alert Behavior During Therapy: WFL for tasks assessed/performed   PT - Cognitive impairments: Safety/Judgement                         Following commands: Intact      Cueing Cueing Techniques: Verbal cues  Exercises General Exercises - Lower Extremity Long Arc Quad: AROM, Both, Seated, 10 reps Hip Flexion/Marching: AROM, Both, 10 reps, Standing Heel Raises: AROM, Both, 10 reps, Standing    General Comments General comments (skin integrity, edema, etc.): pt on 3L HFNC. SPO2 remained 92-93%. HR up to 118 bpm with activity. Pt able to tolerate  only 2-3 mins of activity at a time and needed at least 2 mins recovery before the next exercise      Pertinent Vitals/Pain Pain Assessment Pain Assessment: No/denies pain    Home Living                          Prior Function            PT Goals (current goals can now be found in the care plan section) Acute Rehab PT Goals Patient Stated Goal: to be able to breathe better Progress towards PT goals: Progressing toward goals    Frequency    Min 2X/week      PT Plan      Co-evaluation              AM-PAC PT 6  Clicks Mobility   Outcome Measure  Help needed turning from your back to your side while in a flat bed without using bedrails?: A Little Help needed moving from lying on your back to sitting on the side of a flat bed without using bedrails?: A Little Help needed moving to and from a bed to a chair (including a wheelchair)?: A Little Help needed standing up from a chair using your arms (e.g., wheelchair or bedside chair)?: A Little Help needed to walk in hospital room?: A Little Help needed climbing 3-5 steps with a railing? : A Little 6 Click Score: 18    End of Session Equipment Utilized During Treatment: Oxygen Activity Tolerance: Patient tolerated treatment well Patient left: in chair;with call bell/phone within reach Nurse Communication: Mobility status PT Visit Diagnosis: Unsteadiness on feet (R26.81);Other abnormalities of gait and mobility (R26.89);Muscle weakness (generalized) (M62.81);Difficulty in walking, not elsewhere classified (R26.2)     Time: 8594-8557 PT Time Calculation (min) (ACUTE ONLY): 37 min  Charges:    $Therapeutic Exercise: 8-22 mins $Therapeutic Activity: 8-22 mins PT General Charges $$ ACUTE PT VISIT: 1 Visit                     Richerd Lipoma, PT  Acute Rehab Services Secure chat preferred Office [phone]    Richerd CROME Josilyn Shippee 04/25/2024, 4:47 PM

## 2024-04-26 ENCOUNTER — Other Ambulatory Visit (HOSPITAL_COMMUNITY): Payer: Self-pay

## 2024-04-26 DIAGNOSIS — I25118 Atherosclerotic heart disease of native coronary artery with other forms of angina pectoris: Secondary | ICD-10-CM | POA: Diagnosis not present

## 2024-04-26 DIAGNOSIS — J9621 Acute and chronic respiratory failure with hypoxia: Secondary | ICD-10-CM | POA: Diagnosis not present

## 2024-04-26 DIAGNOSIS — J441 Chronic obstructive pulmonary disease with (acute) exacerbation: Secondary | ICD-10-CM | POA: Diagnosis not present

## 2024-04-26 DIAGNOSIS — Z8551 Personal history of malignant neoplasm of bladder: Secondary | ICD-10-CM | POA: Diagnosis not present

## 2024-04-26 DIAGNOSIS — E440 Moderate protein-calorie malnutrition: Secondary | ICD-10-CM

## 2024-04-26 DIAGNOSIS — Z515 Encounter for palliative care: Secondary | ICD-10-CM

## 2024-04-26 MED ORDER — PREDNISONE 10 MG PO TABS
ORAL_TABLET | ORAL | 0 refills | Status: DC
  Filled 2024-04-26: qty 45, 15d supply, fill #0

## 2024-04-26 MED ORDER — MORPHINE SULFATE 10 MG/5ML PO SOLN
2.5000 mg | ORAL | 0 refills | Status: AC | PRN
  Filled 2024-04-26: qty 55, 7d supply, fill #0

## 2024-04-26 MED ORDER — ALUM & MAG HYDROXIDE-SIMETH 200-200-20 MG/5ML PO SUSP
30.0000 mL | Freq: Four times a day (QID) | ORAL | 0 refills | Status: AC | PRN
  Filled 2024-04-26: qty 355, 3d supply, fill #0

## 2024-04-26 MED ORDER — GUAIFENESIN ER 600 MG PO TB12
1200.0000 mg | ORAL_TABLET | Freq: Two times a day (BID) | ORAL | 0 refills | Status: AC | PRN
  Filled 2024-04-26: qty 20, 5d supply, fill #0

## 2024-04-26 NOTE — TOC Transition Note (Addendum)
 Transition of Care [facility]) - Discharge Note   Patient Details  Name: Timothy [name] MRN: [id number] Date of Birth: 07/17/1955  [facility] CM/SW Contact:  [name], RN Phone Number: 04/26/2024, 12:35 PM   Clinical Narrative:    Patient stable for discharge.  Notified [name] with [facility] of discharge.  Bipap has been delivered.  Patient agreeable to OP palliative. Notified Melissa with [facility]. Follow up apt on AVS.   Final next level of care: Home w Home Health Services Barriers to Discharge: Barriers Resolved   Patient Goals and CMS Choice Patient states their goals for this hospitalization and ongoing recovery are:: return home CMS Medicare.gov Compare Post Acute Care list provided to:: Patient Choice offered to / list presented to : Patient      Discharge Placement  home                     Discharge Plan and Services Additional resources added to the After Visit Summary for     Discharge Planning Services: CM Consult Post Acute Care Choice: Durable Medical Equipment, Home Health          DME Arranged: Bipap DME Agency: [facility] Date DME Agency Contacted: 04/26/24 Time DME Agency Contacted: 1235 Representative spoke with at DME Agency: [facility] HH Arranged: OT, PT HH Agency: [facility] Date HH Agency Contacted: 04/20/24 Time HH Agency Contacted: 1514 Representative spoke with at [facility] Agency: [name]  Social Drivers of Health (SDOH) Interventions SDOH Screenings   Food Insecurity: No Food Insecurity (04/19/2024)  Housing: Low Risk (04/19/2024)  Transportation Needs: No Transportation Needs (04/19/2024)  Utilities: Not At Risk (04/19/2024)  Alcohol Screen: Low Risk (06/29/2023)  Depression (PHQ2-9): Low Risk (02/25/2024)  Financial Resource Strain: Medium Risk (02/25/2023)  Physical Activity: Inactive (06/29/2023)  Social Connections: Moderately Isolated (04/19/2024)  Stress: No Stress Concern Present  (06/29/2023)  Tobacco Use: Medium Risk (04/19/2024)  Health Literacy: Adequate Health Literacy (06/29/2023)     Readmission Risk Interventions    04/26/2024   12:35 PM  Readmission Risk Prevention Plan  Post Dischage Appt Complete  Medication Screening Complete  Transportation Screening Complete

## 2024-04-26 NOTE — Discharge Instructions (Signed)
 SABRA

## 2024-04-26 NOTE — Plan of Care (Signed)

## 2024-04-26 NOTE — Telephone Encounter (Signed)
 Patient scheduled.

## 2024-04-26 NOTE — Plan of Care (Signed)
" °  Problem: Education: Goal: Knowledge of General Education information will improve Description: Including pain rating scale, medication(s)/side effects and non-pharmacologic comfort measures Outcome: Completed/Met   Problem: Health Behavior/Discharge Planning: Goal: Ability to manage health-related needs will improve Outcome: Completed/Met   Problem: Clinical Measurements: Goal: Ability to maintain clinical measurements within normal limits will improve Outcome: Completed/Met Goal: Will remain free from infection Outcome: Completed/Met Goal: Diagnostic test results will improve Outcome: Completed/Met Goal: Respiratory complications will improve Outcome: Completed/Met Goal: Cardiovascular complication will be avoided Outcome: Completed/Met   Problem: Activity: Goal: Risk for activity intolerance will decrease 04/26/2024 1153 by Charlann Lucie CROME, RN Outcome: Completed/Met 04/26/2024 1043 by Charlann Lucie CROME, RN Outcome: Progressing   Problem: Nutrition: Goal: Adequate nutrition will be maintained 04/26/2024 1153 by Charlann Lucie CROME, RN Outcome: Completed/Met 04/26/2024 1043 by Charlann Lucie CROME, RN Outcome: Progressing   Problem: Coping: Goal: Level of anxiety will decrease 04/26/2024 1153 by Charlann Lucie CROME, RN Outcome: Completed/Met 04/26/2024 1043 by Charlann Lucie CROME, RN Outcome: Progressing   Problem: Elimination: Goal: Will not experience complications related to bowel motility 04/26/2024 1153 by Charlann Lucie CROME, RN Outcome: Completed/Met 04/26/2024 1043 by Charlann Lucie CROME, RN Outcome: Progressing Goal: Will not experience complications related to urinary retention 04/26/2024 1153 by Charlann Lucie CROME, RN Outcome: Completed/Met 04/26/2024 1043 by Charlann Lucie CROME, RN Outcome: Progressing   Problem: Pain Managment: Goal: General experience of comfort will improve and/or be controlled 04/26/2024 1153 by Charlann Lucie CROME, RN Outcome:  Completed/Met 04/26/2024 1043 by Charlann Lucie CROME, RN Outcome: Progressing   Problem: Safety: Goal: Ability to remain free from injury will improve 04/26/2024 1153 by Charlann Lucie CROME, RN Outcome: Completed/Met 04/26/2024 1043 by Charlann Lucie CROME, RN Outcome: Progressing   Problem: Skin Integrity: Goal: Risk for impaired skin integrity will decrease 04/26/2024 1153 by Charlann Lucie CROME, RN Outcome: Completed/Met 04/26/2024 1043 by Charlann Lucie CROME, RN Outcome: Progressing   Problem: Education: Goal: Knowledge of disease or condition will improve Outcome: Completed/Met Goal: Knowledge of the prescribed therapeutic regimen will improve Outcome: Completed/Met Goal: Individualized Educational Video(s) Outcome: Completed/Met   Problem: Activity: Goal: Ability to tolerate increased activity will improve Outcome: Completed/Met Goal: Will verbalize the importance of balancing activity with adequate rest periods Outcome: Completed/Met   Problem: Respiratory: Goal: Ability to maintain a clear airway will improve Outcome: Completed/Met Goal: Levels of oxygenation will improve Outcome: Completed/Met Goal: Ability to maintain adequate ventilation will improve Outcome: Completed/Met   "

## 2024-04-26 NOTE — Discharge Summary (Signed)
 " Physician Discharge Summary   Patient: Timothy [name] MRN: [id number] DOB: 03/31/1956  Admit date:     04/18/2024  Discharge date: 04/26/2024  Discharge Physician: Concepcion Riser   PCP: [name], MD   Recommendations at discharge:  {Tip this will not be part of the note when signed- Example include specific recommendations for outpatient follow-up, pending tests to follow-up on. (Optional):26781}  ***  Discharge Diagnoses: Principal Problem:   COPD with acute exacerbation (HCC) Active Problems:   Mixed hyperlipidemia   Acute on chronic respiratory failure (HCC)   S/P right coronary artery (RCA) stent placement   Acute cystitis without hematuria   History of bladder cancer   Coronary artery disease of native artery of native heart with stable angina pectoris  Resolved Problems:   * No resolved hospital problems. [place] [facility] Course: No notes on file  Assessment and Plan: No notes have been filed under this hospital service. Service: Hospitalist     {Tip this will not be part of the note when signed Body mass index is 19.5 kg/m. , ,  (Optional):26781}  {(NOTE) Pain control PDMP Statment (Optional):26782} Consultants: *** Procedures performed: ***  Disposition: {Plan; Disposition:26390} Diet recommendation:  Discharge Diet Orders (From admission, onward)     Start     Ordered   04/26/24 0000  Diet - low sodium heart healthy        04/26/24 1121           {Diet_Plan:26776} DISCHARGE MEDICATION: Allergies as of 04/26/2024       Reactions   Ace Inhibitors Other (See Comments)   Caused wheezing   Aspirin  Swelling, Other (See Comments)   Lips swell- still takes approx 2 times a week in 2021 Low dose ASA is fine, no problems   Ibuprofen Swelling, Other (See Comments)   Lips swell   Losartan  Other (See Comments)   Leg cramps        Medication List     STOP taking these medications    famotidine  20 MG tablet Commonly known as:  PEPCID        TAKE these medications    acetaminophen  500 MG tablet Commonly known as: TYLENOL  Take 500-1,000 mg by mouth every 8 (eight) hours as needed for mild pain or headache.   albuterol  108 (90 Base) MCG/ACT inhaler Commonly known as: VENTOLIN  HFA Inhale 2 puffs into the lungs every 6 (six) hours as needed for wheezing or shortness of breath.   alum & mag hydroxide-simeth 200-200-20 MG/5ML suspension Commonly known as: MAALOX/MYLANTA Take 30 mLs by mouth every 6 (six) hours as needed for indigestion or heartburn.   amLODipine  5 MG tablet Commonly known as: NORVASC  Take 1 tablet (5 mg total) by mouth daily.   aspirin  EC 81 MG tablet Take 1 tablet (81 mg total) by mouth daily.   atorvastatin  80 MG tablet Commonly known as: LIPITOR  Take 1 tablet (80 mg total) by mouth daily.   bisoprolol  5 MG tablet Commonly known as: ZEBETA  Take 1 tablet (5 mg total) by mouth in the morning.   Breztri  Aerosphere 160-9-4.8 MCG/ACT Aero inhaler Generic drug: budesonide -glycopyrrolate -formoterol  INHALE 2 PUFFS INTO THE LUNGS IN THE MORNING AND AT BEDTIME   finasteride  5 MG tablet Commonly known as: PROSCAR  Take 1 tablet (5 mg total) by mouth daily.   guaiFENesin  600 MG 12 hr tablet Commonly known as: MUCINEX  Take 2 tablets (1,200 mg total) by mouth 2 (two) times daily as needed for up to 5 days  for cough or to loosen phlegm.   morphine  10 MG/5ML solution Take 1.3 mLs (2.6 mg total) by mouth every 4 (four) hours as needed (Dyspnea).   nitroGLYCERIN  0.4 MG SL tablet Commonly known as: Nitrostat  Place 1 tablet (0.4 mg total) under the tongue every 5 (five) minutes as needed for chest pain.   OXYGEN Inhale 2 L into the lungs at bedtime.   pantoprazole  40 MG tablet Commonly known as: Protonix  Take 1 tablet (40 mg total) by mouth daily. Take 30-60 min before first meal of the day   predniSONE  10 MG tablet Commonly known as: DELTASONE  Take 5 tablets (50 mg total) by mouth daily  with breakfast for 3 days, THEN 4 tablets (40 mg total) daily with breakfast for 3 days, THEN 3 tablets (30 mg total) daily with breakfast for 3 days, THEN 2 tablets (20 mg total) daily with breakfast for 3 days, THEN 1 tablet (10 mg total) daily with breakfast for 3 days. Start taking on: April 27, 2024   ticagrelor  60 MG Tabs tablet Commonly known as: Brilinta  Take 1 tablet (60 mg total) by mouth 2 (two) times daily. Appointment Required For Further Refills [phone]               Durable Medical Equipment  (From admission, onward)           Start     Ordered   04/25/24 1212  For home use only DME Bipap  Once       Comments: IPAP 10 EPAP 5 CPAP supplies needed: medium full face mask, headgear, cushions, filters, climate control tubing and water chamber. Heated humidity with all PAP supplies.  Length of Need: Lifetime  Question Answer Comment  Length of Need Lifetime   Inspiratory pressure 5   Expiratory pressure 10      04/25/24 1213   04/20/24 1508  For home use only DME 4 wheeled rolling walker with seat  Once       Question:  Patient needs a walker to treat with the following condition  Answer:  Physical deconditioning   04/20/24 1508            Follow-up Information     [facility]) Follow up.   Specialty: Home Health Services Why: Home health has been arragned. They will contact you to schedule apt Contact information: [address] [place] [address] [place]  [address] [phone]        [facility] (DME) Follow up.   Specialty: DME Services Why: home oxygen and bipap Contact information: [address] [facility] [place]  [address] [phone]               Discharge Exam: Fredricka Weights   04/18/24 1919 04/19/24 1112  Weight: 56.7 kg 54.8 kg   ***  Condition at discharge: {DC Condition:26389}  The results of significant diagnostics from this  hospitalization (including imaging, microbiology, ancillary and laboratory) are listed below for reference.   Imaging Studies: DG Abd Portable 1V Result Date: 04/22/2024 CLINICAL DATA:  Small bowel obstruction. EXAM: PORTABLE ABDOMEN - 1 VIEW COMPARISON:  None Available. FINDINGS: No bowel dilatation or evidence of obstruction. No free air or radiopaque calculi. No acute osseous pathology. Degenerative changes spine. IMPRESSION: Nonobstructive bowel gas pattern. Electronically Signed   By: Vanetta Chou M.D.   On: 04/22/2024 11:05   DG Chest Port 1 View Result Date: 04/21/2024 CLINICAL DATA:  Shortness of breath  EXAM: PORTABLE CHEST 1 VIEW COMPARISON:  04/18/2024, chest CT 06/15/2023 FINDINGS: Hyperinflation with emphysema. No acute airspace disease or effusion. Stable cardiomediastinal silhouette. Mild scarring or atelectasis at the lingula. Air-filled structure in the epigastric area IMPRESSION: 1. Hyperinflation with emphysema. No acute airspace disease. 2. Air-filled structure in the epigastric area, presumably air distended stomach or bowel but recommend dedicated abdominal radiographs to further assess. 3. These results will be called to the ordering clinician or representative by the Radiologist Assistant, and communication documented in the PACS or [employer]. Electronically Signed   By: Luke Bun M.D.   On: 04/21/2024 23:47   DG Chest Port 1 View Result Date: 04/18/2024 CLINICAL DATA:  Shortness of breath. EXAM: PORTABLE CHEST 1 VIEW COMPARISON:  Chest radiograph dated 03/04/2023. FINDINGS: Background of emphysema. No focal consolidation, pleural effusion, or pneumothorax. The cardiac silhouette is within normal limits. No acute osseous pathology. IMPRESSION: No active disease. Electronically Signed   By: Vanetta Chou M.D.   On: 04/18/2024 19:57    Microbiology: Results for orders placed or performed during the hospital encounter of 04/18/24  Blood Culture (routine x 2)      Status: None   Collection Time: 04/18/24  7:40 PM   Specimen: BLOOD RIGHT ARM  Result Value Ref Range Status   Specimen Description BLOOD RIGHT ARM  Final   Special Requests   Final    BOTTLES DRAWN AEROBIC AND ANAEROBIC Blood Culture adequate volume   Culture   Final    NO GROWTH 5 DAYS Performed at [facility], [address], [place], [place] [address]    Report Status 04/23/2024 FINAL  Final  Blood Culture (routine x 2)     Status: None   Collection Time: 04/18/24  7:46 PM   Specimen: BLOOD RIGHT HAND  Result Value Ref Range Status   Specimen Description BLOOD RIGHT HAND  Final   Special Requests   Final    BOTTLES DRAWN AEROBIC ONLY Blood Culture adequate volume   Culture   Final    NO GROWTH 5 DAYS Performed at [facility], [address], [place], [place] [address]    Report Status 04/23/2024 FINAL  Final  Resp panel by RT-PCR (RSV, Flu A&B, Covid) Anterior Nasal Swab     Status: None   Collection Time: 04/18/24  7:48 PM   Specimen: Anterior Nasal Swab  Result Value Ref Range Status   SARS Coronavirus 2 by RT PCR NEGATIVE NEGATIVE Final   Influenza A by PCR NEGATIVE NEGATIVE Final   Influenza B by PCR NEGATIVE NEGATIVE Final    Comment: (NOTE) The Xpert Xpress SARS-CoV-2/FLU/RSV plus assay is intended as an aid in the diagnosis of influenza from Nasopharyngeal swab specimens and should not be used as a sole basis for treatment. Nasal washings and aspirates are unacceptable for Xpert Xpress SARS-CoV-2/FLU/RSV testing.  Fact Sheet for Patients: bloggercourse.com  Fact Sheet for Healthcare Providers: seriousbroker.it  This test is not yet approved or cleared by the [place]  FDA and has been authorized for detection and/or diagnosis of SARS-CoV-2 by FDA under an Emergency Use Authorization (EUA). This EUA will remain in effect (meaning this test can be used) for the duration of the COVID-19  declaration under Section 564(b)(1) of the Act, 21 U.S.C. section 360bbb-3(b)(1), unless the authorization is terminated or revoked.     Resp Syncytial Virus by PCR NEGATIVE NEGATIVE Final    Comment: (NOTE) Fact Sheet for Patients: bloggercourse.com  Fact Sheet  for Healthcare Providers: seriousbroker.it  This test is not yet approved or cleared by the [place]  FDA and has been authorized for detection and/or diagnosis of SARS-CoV-2 by FDA under an Emergency Use Authorization (EUA). This EUA will remain in effect (meaning this test can be used) for the duration of the COVID-19 declaration under Section 564(b)(1) of the Act, 21 U.S.C. section 360bbb-3(b)(1), unless the authorization is terminated or revoked.  Performed at [facility], [address], [place], [place] [address]   Respiratory (~20 pathogens) panel by PCR     Status: None   Collection Time: 04/18/24  7:48 PM   Specimen: Nasopharyngeal Swab; Respiratory  Result Value Ref Range Status   Adenovirus NOT DETECTED NOT DETECTED Final   Coronavirus 229E NOT DETECTED NOT DETECTED Final    Comment: (NOTE) The Coronavirus on the Respiratory Panel, DOES NOT test for the novel  Coronavirus (2019 nCoV)    Coronavirus HKU1 NOT DETECTED NOT DETECTED Final   Coronavirus NL63 NOT DETECTED NOT DETECTED Final   Coronavirus OC43 NOT DETECTED NOT DETECTED Final   Metapneumovirus NOT DETECTED NOT DETECTED Final   Rhinovirus / Enterovirus NOT DETECTED NOT DETECTED Final   Influenza A NOT DETECTED NOT DETECTED Final   Influenza B NOT DETECTED NOT DETECTED Final   Parainfluenza Virus 1 NOT DETECTED NOT DETECTED Final   Parainfluenza Virus 2 NOT DETECTED NOT DETECTED Final   Parainfluenza Virus 3 NOT DETECTED NOT DETECTED Final   Parainfluenza Virus 4 NOT DETECTED NOT DETECTED Final   Respiratory Syncytial Virus NOT DETECTED NOT DETECTED Final   Bordetella pertussis NOT  DETECTED NOT DETECTED Final   Bordetella Parapertussis NOT DETECTED NOT DETECTED Final   Chlamydophila pneumoniae NOT DETECTED NOT DETECTED Final   Mycoplasma pneumoniae NOT DETECTED NOT DETECTED Final    Comment: Performed at [facility], [address] [place], [place] [address]  Urine Culture     Status: Abnormal   Collection Time: 04/18/24 11:00 PM   Specimen: Urine, Random  Result Value Ref Range Status   Specimen Description URINE, RANDOM  Final   Special Requests NONE Reflexed from [id number]  Final   Culture (A)  Final    <10,000 COLONIES/mL INSIGNIFICANT GROWTH Performed at [facility], [address], [place], [place] [address]    Report Status 04/20/2024 FINAL  Final  MRSA Next Gen by PCR, Nasal     Status: None   Collection Time: 04/19/24 11:25 AM   Specimen: Nasal Mucosa; Nasal Swab  Result Value Ref Range Status   MRSA by PCR Next Gen NOT DETECTED NOT DETECTED Final    Comment: (NOTE) The GeneXpert MRSA Assay (FDA approved for NASAL specimens only), is one component of a comprehensive MRSA colonization surveillance program. It is not intended to diagnose MRSA infection nor to guide or monitor treatment for MRSA infections. Test performance is not FDA approved in patients less than 69 years old. Performed at [facility], [address] [place], [place] [address]   C Difficile Quick Screen w PCR reflex     Status: None   Collection Time: 04/20/24  5:10 AM   Specimen: STOOL  Result Value Ref Range Status   C Diff antigen NEGATIVE NEGATIVE Final   C Diff toxin NEGATIVE NEGATIVE Final   C Diff interpretation No C. difficile detected.  Final    Comment: Performed at [facility], [address], [place], [place] [address]    Labs:  CBC: Recent Labs  Lab 04/21/24 0311 04/22/24 0447  WBC 18.9* 13.3*  NEUTROABS 15.1* 11.7*  HGB 14.9 14.1  HCT 44.7 42.4  MCV 91.0 90.8  PLT 350 305   Basic Metabolic Panel: Recent Labs  Lab  04/21/24 0311 04/22/24 0447  NA 141 138  K 4.0 4.7  CL 101 99  CO2 30 31  GLUCOSE 109* 135*  BUN 16 18  CREATININE 0.52* 0.54*  CALCIUM  9.3 9.0  MG 2.6* 2.4  PHOS  --  2.7   Liver Function Tests: Recent Labs  Lab 04/21/24 0311 04/22/24 0447  AST 26 21  ALT 22 29  ALKPHOS 132* 88  BILITOT 0.6 0.5  PROT 6.7 6.2*  ALBUMIN 3.9 3.7   CBG: Recent Labs  Lab 04/22/24 0542  GLUCAP 136*    Discharge time spent: {LESS THAN/GREATER THAN:26388} 30 minutes.  Signed: Concepcion Riser, MD [facility] 04/26/2024 "

## 2024-04-26 NOTE — Plan of Care (Signed)
" ° °  Palliative Medicine Inpatient Follow Up Note   Per chart review it appears that Timothy [name] will be discharged today.  OP Palliative care has been recommended to continue ongoing goals of care conversations.  No Charge.  ______________________________________________________________________________________ Timothy [name] [place] Palliative Medicine Team Team Cell Phone: [phone] Please utilize secure chat with additional questions, if there is no response within 30 minutes please call the above phone number   "

## 2024-04-28 ENCOUNTER — Telehealth: Payer: Self-pay | Admitting: Internal Medicine

## 2024-04-28 ENCOUNTER — Telehealth: Payer: Self-pay | Admitting: *Deleted

## 2024-04-28 NOTE — Transitions of Care (Post Inpatient/ED Visit) (Addendum)
 "  04/28/2024  Name: Timothy [name] MRN: [id number] DOB: Oct 22, 1955  Today's TOC FU Call Status: Today's TOC FU Call Status:: Successful TOC FU Call Completed TOC FU Call Complete Date: 04/28/24  Patient's Name and Date of Birth confirmed. Name, DOB  Transition Care Management Follow-up Telephone Call Date of Discharge: 04/26/24 Discharge Facility: [facility]) Type of Discharge: Inpatient Admission Primary Inpatient Discharge Diagnosis:: COPD with acute exacerbation How have you been since you were released from the hospital?: Better Any questions or concerns?: No  Items Reviewed: Did you receive and understand the discharge instructions provided?: Yes Medications obtained,verified, and reconciled?: Yes (Medications Reviewed) Any new allergies since your discharge?: No Dietary orders reviewed?: Yes Type of Diet Ordered:: Heart Healthy, low sodium Do you have support at home?: Yes People in Home [RPT]: significant other Name of Support/Comfort Primary Source: SO/[name]  Medications Reviewed Today: Medications Reviewed Today     Reviewed by Lucky Andrea LABOR, RN (Registered Nurse) on 04/28/24 at [phone]  Med List Status: <None>   Medication Order Taking? Sig Documenting Provider Last Dose Status Informant  acetaminophen  (TYLENOL ) 500 MG tablet [id number] Yes Take 500-1,000 mg by mouth every 8 (eight) hours as needed for mild pain or headache.  [provider]  Active Self, Pharmacy Records  albuterol  (VENTOLIN  HFA) [phone] Base) MCG/ACT inhaler [id number] Yes Inhale 2 puffs into the lungs every 6 (six) hours as needed for wheezing or shortness of breath. [name], MD  Active Self, Pharmacy Records  alum & mag hydroxide-simeth (MAALOX/MYLANTA) 200-200-20 MG/5ML suspension [id number] Yes Take 30 mLs by mouth every 6 (six) hours as needed for indigestion or heartburn. Darci Pore, MD  Active   amLODipine  (NORVASC ) 5 MG tablet [id number] Yes Take 1 tablet (5 mg  total) by mouth daily. [name], MD  Active Self, Pharmacy Records  aspirin  EC 81 MG tablet [id number] Yes Take 1 tablet (81 mg total) by mouth daily. Marilyne Stephane DASEN, MD  Active Self, Pharmacy Records           Med Note [place], [place] D   Wed Mar 31, 2017 12:42 AM)    atorvastatin  (LIPITOR ) 80 MG tablet [id number] Yes Take 1 tablet (80 mg total) by mouth daily. [name], MD  Active Self, Pharmacy Records  bisoprolol  (ZEBETA ) 5 MG tablet [id number] Yes Take 1 tablet (5 mg total) by mouth in the morning. Michele Richardson, DO  Active Self, Pharmacy Records  BREZTRI  AEROSPHERE 160-9-4.8 MCG/ACT AERO [id number] Yes INHALE 2 PUFFS INTO THE LUNGS IN THE MORNING AND AT BEDTIME Darlean Ozell NOVAK, MD  Active Self, Pharmacy Records  finasteride  (PROSCAR ) 5 MG tablet [id number]  Take 1 tablet (5 mg total) by mouth daily.  Patient not taking: Reported on 04/28/2024   Shona Layman JAYSON, MD  Active Self, Pharmacy Records  guaiFENesin  (MUCINEX ) 600 MG 12 hr tablet [id number] Yes Take 2 tablets (1,200 mg total) by mouth 2 (two) times daily as needed for up to 5 days for cough or to loosen phlegm. Darci Pore, MD  Active   morphine  10 MG/5ML solution [id number] Yes Take 1.3 mLs (2.6 mg total) by mouth every 4 (four) hours as needed (Dyspnea). Darci Pore, MD  Active   nitroGLYCERIN  (NITROSTAT ) 0.4 MG SL tablet [id number] Yes Place 1 tablet (0.4 mg total) under the tongue every 5 (five) minutes as needed for chest pain. Janene Boer, PA  Active Self, Pharmacy Records  Med Note CARLEEN, CHANELLE D   Wed Apr 19, 2024  9:04 AM) Has at home - hasn't had to use.  OXYGEN [id number] Yes Inhale 2 L into the lungs at bedtime. [provider]  Active Self, Pharmacy Records  pantoprazole  (PROTONIX ) 40 MG tablet [id number] Yes Take 1 tablet (40 mg total) by mouth daily. Take 30-60 min before first meal of the day Darlean Ozell NOVAK, MD  Active Self, Pharmacy Records  predniSONE   (DELTASONE ) 10 MG tablet [id number] Yes Take 5 tablets (50 mg total) by mouth daily with breakfast for 3 days, THEN 4 tablets (40 mg total) daily with breakfast for 3 days, THEN 3 tablets (30 mg total) daily with breakfast for 3 days, THEN 2 tablets (20 mg total) daily with breakfast for 3 days, THEN 1 tablet (10 mg total) daily with breakfast for 3 days. Darci Pore, MD  Active   ticagrelor  (BRILINTA ) 60 MG TABS tablet [id number] Yes Take 1 tablet (60 mg total) by mouth 2 (two) times daily. Appointment Required For Further Refills [phone] Michele Richardson, DO  Active Self, Pharmacy Records            Home Care and Equipment/Supplies: Were Home Health Services Ordered?: Yes Name of Home Health Agency:: [facility] Has Agency set up a time to come to your home?: Yes First Home Health Visit Date: 04/28/24 Any new equipment or medical supplies ordered?: Yes Name of Medical supply agency?: [facility] for oxygen and BiPAP Were you able to get the equipment/medical supplies?: Yes Do you have any questions related to the use of the equipment/supplies?: Yes What questions do you have?: Patient will contact [facility] today for assistance with setting up BiPap  Functional Questionnaire: Do you need assistance with bathing/showering or dressing?: Yes (Grandson assists) Do you need assistance with meal preparation?: No Do you need assistance with eating?: No Do you have difficulty maintaining continence: No Do you need assistance with getting out of bed/getting out of a chair/moving?: No Do you have difficulty managing or taking your medications?: No  Follow up appointments reviewed: PCP Follow-up appointment confirmed?: No (Patient prefers to keep the next scheduled appointment on 06/27/24) MD Provider Line Number:[phone] Given: No Specialist Hospital Follow-up appointment confirmed?: Yes Date of Specialist follow-up appointment?: 05/11/24 Follow-Up Specialty Provider:: Dr. Darlean Do you need  transportation to your follow-up appointment?: No Do you understand care options if your condition(s) worsen?: Yes-patient verbalized understanding  SDOH Interventions Today    Flowsheet Row Most Recent Value  SDOH Interventions   Food Insecurity Interventions Intervention Not Indicated  Housing Interventions Intervention Not Indicated  Transportation Interventions Intervention Not Indicated  Utilities Interventions Intervention Not Indicated    Goals Addressed             This Visit's Progress    VBCI Transitions of Care (TOC) Care Plan       Problems:  Recent Hospitalization for treatment of COPD Equipment/DME barrier related to BIPAP and No Hospital Follow Up Provider appointment patient declines to schedule  Goal:  Over the next 30 days, the patient will not experience hospital readmission  Interventions:  Transitions of Care: Durable Medical Equipment (DME) reviewed with patient/caregiver Doctor Visits  - discussed the importance of doctor visits Post discharge activity limitations prescribed by provider reviewed Reviewed Signs and symptoms of infection SDOH assessment Medication review, discussed prednisone  instructions in detail Advised patient to contact [facility] regarding BiPAP-he has the number and will call today Discussed the importance of PCP hospital follow up, patient  prefers to attend Pulmonology on 05/11/24 and PCP on 06/27/24 Discussed [facility] has contacted patient to start [facility] PT-first visit on 04/28/24   Patient Self Care Activities:  Attend all scheduled provider appointments Call provider office for new concerns or questions  Notify RN Care Manager of [facility] call rescheduling needs Participate in Transition of Care Program/Attend [facility] scheduled calls Take medications as prescribed   limit outdoor activity during cold weather begin a symptom diary keep follow-up appointments: Pulmonology on 05/11/24  Plan:  Telephone follow up appointment with care management  team member scheduled for:  05/03/24 at 2:15pm       Discussed and offered 30 day TOC program.  Patient       enrolled.  The patient has been provided with contact information for the care management team and has been advised to call with any health -related questions or concerns.  The patient verbalized understanding with current plan of care.  The patient is directed to their insurance card regarding availability of benefits coverage.   Andrea Dimes RN, BSN   [facility] Health RN Care Manager [phone]  "

## 2024-04-28 NOTE — Telephone Encounter (Signed)
 I received a call from Brian/ [facility] and provided him with the authorization for the visits requested. He stated that he just left the patient's house.

## 2024-04-28 NOTE — Telephone Encounter (Signed)
 Call returned to [facility] and I had to leave a message requesting a call back,

## 2024-04-28 NOTE — Telephone Encounter (Signed)
 Copied from CRM #[id number]. Topic: Clinical - Home Health Verbal Orders >> Apr 28, 2024 11:02 AM Selinda RAMAN wrote:  Caller/Agency: Brain with [facility] Callback Number: [phone] Service Requested: Physical Therapy Frequency: 1 x a week for 9 weeks Any new concerns about the patient? No  Please assist patient further

## 2024-05-03 ENCOUNTER — Other Ambulatory Visit: Payer: Self-pay | Admitting: *Deleted

## 2024-05-03 NOTE — Patient Instructions (Signed)
 Visit Information  Thank you for taking time to visit with me today. Please don't hesitate to contact me if I can be of assistance to you before our next scheduled telephone appointment.   Following is a copy of your care plan:   Goals Addressed             This Visit's Progress    VBCI Transitions of Care (TOC) Care Plan       Problems:  Recent Hospitalization for treatment of COPD Equipment/DME barrier related to BIPAP and No Hospital Follow Up Provider appointment patient declines to schedule  Goal:  Over the next 30 days, the patient will not experience hospital readmission  Interventions:  Transitions of Care: Doctor Visits  - discussed the importance of doctor visits Post discharge activity limitations prescribed by provider reviewed Reviewed Signs and symptoms of infection SDOH assessment Medication review, discussed prednisone  instructions in detail Discussed BIPAP-patient using every night Reviewed [facility] is scheduled for home visit on 05/08/24 Discussed the importance of rest and nutrition   Patient Self Care Activities:  Attend all scheduled provider appointments Call provider office for new concerns or questions  Notify RN Care Manager of [facility] call rescheduling needs Participate in Transition of Care Program/Attend [facility] scheduled calls Take medications as prescribed   limit outdoor activity during cold weather begin a symptom diary keep follow-up appointments: Pulmonology on 05/11/24  Plan:  Telephone follow up appointment with care management team member scheduled for:  05/09/24 at 2 pm        Patient verbalizes understanding of instructions and care plan provided today and agrees to view in MyChart. Active MyChart status and patient understanding of how to access instructions and care plan via MyChart confirmed with patient.     Telephone follow up appointment with care management team member scheduled for:05/09/24 at 2pm  Please call the care guide team  at [phone] if you need to cancel or reschedule your appointment.   Please call [phone]-TALK (toll free, 24 hour hotline) go to [facility] Urgent [facility] [address] [place] [phone]) call 911 if you are experiencing a Mental Health or Behavioral Health Crisis or need someone to talk to.  Andrea Dimes RN, BSN [place]  [facility] Health RN Care Manager [phone]

## 2024-05-03 NOTE — Transitions of Care (Post Inpatient/ED Visit) (Signed)
 " Transition of Care week 2  Visit Note  05/03/2024  Name: Timothy [name] MRN: [id number]          DOB: October 27, 1955  Situation: Patient enrolled in [facility] 30-day program. Visit completed with Timothy [name] by telephone.   Background:   Initial Transition Care Management Follow-up Telephone Call Discharge Date and Diagnosis: 04/26/24, COPD with acute exacerbation   Past Medical History:  Diagnosis Date   Altered mental status, improved at discharge 06/19/2013   Arthritis    Bladder cancer (HCC) 2018   Bronchitis, chronic (HCC) 06/19/2013   CAD (coronary artery disease)    a. s/p cardiac arrest in 06/2013 with DES to RCA b. 02/2020: Inferior STEMI and cath showing total occlusion of mid-RCA treated with DES placement.    Cancer [facility])    bladder   Cardiac arrest, hypothermic protocol 06/12/2013   COPD exacerbation (HCC) 06/12/2013   GERD (gastroesophageal reflux disease)    Hx of leukocytosis    Hyperlipidemia LDL goal < 70 06/19/2013   Hypokalemia, replaced 06/19/2013   Hypomagnesemia, replaced 06/19/2013   Hypotension, requiring levophed  06/19/2013   NSTEMI (non-ST elevated myocardial infarction), DES to RCA 06/13/2013   Pre-diabetes    Tobacco abuse 06/19/2013    Assessment: Patient Reported Symptoms: Cognitive Cognitive Status: Able to follow simple commands, Alert and oriented to person, place, and time, Normal speech and language skills      Neurological Neurological Review of Symptoms: No symptoms reported    HEENT HEENT Symptoms Reported: No symptoms reported      Cardiovascular Cardiovascular Symptoms Reported: No symptoms reported    Respiratory Additional Respiratory Details: Patient reports feeling better. SOB with exertion, Oxygen 2-3 L continuously, keeping O2 sat between 90-96%. Using BiPAP at night. Follow up with Pulmonology on 05/11/24. Respiratory Self-Management Outcome: 4 (good)  Endocrine Endocrine Symptoms Reported: No symptoms reported Is patient  diabetic?: No    Gastrointestinal Gastrointestinal Symptoms Reported: No symptoms reported      Genitourinary Genitourinary Symptoms Reported: No symptoms reported    Integumentary Integumentary Symptoms Reported: Bruising Additional Integumentary Details: bruises are improving    Musculoskeletal Musculoskelatal Symptoms Reviewed: Weakness Musculoskeletal Comment: HH PT weekly with [facility]      Psychosocial Psychosocial Symptoms Reported: Not assessed         There were no vitals filed for this visit.    Medications Reviewed Today     Reviewed by Lucky Andrea LABOR, RN (Registered Nurse) on 05/03/24 at 1411  Med List Status: <None>   Medication Order Taking? Sig Documenting Provider Last Dose Status Informant  acetaminophen  (TYLENOL ) 500 MG tablet [id number] Yes Take 500-1,000 mg by mouth every 8 (eight) hours as needed for mild pain or headache.  [provider]  Active Self, Pharmacy Records  albuterol  (VENTOLIN  HFA) 108 [phone] Base) MCG/ACT inhaler [id number] Yes Inhale 2 puffs into the lungs every 6 (six) hours as needed for wheezing or shortness of breath. [name], MD  Active Self, Pharmacy Records  alum & mag hydroxide-simeth (MAALOX/MYLANTA) 200-200-20 MG/5ML suspension [id number] Yes Take 30 mLs by mouth every 6 (six) hours as needed for indigestion or heartburn. Darci Pore, MD  Active   amLODipine  (NORVASC ) 5 MG tablet [id number] Yes Take 1 tablet (5 mg total) by mouth daily. [name], MD  Active Self, Pharmacy Records  aspirin  EC 81 MG tablet [id number] Yes Take 1 tablet (81 mg total) by mouth daily. Marilyne Stephane DASEN, MD  Active Self, Pharmacy Records  Med Note [place]OL[place], CRYSTAL D   Wed Mar 31, 2017 12:42 AM)    atorvastatin  (LIPITOR ) 80 MG tablet [id number] Yes Take 1 tablet (80 mg total) by mouth daily. [name], MD  Active Self, Pharmacy Records  bisoprolol  (ZEBETA ) 5 MG tablet [id number] Yes Take 1 tablet (5 mg total)  by mouth in the morning. Michele Richardson, DO  Active Self, Pharmacy Records  BREZTRI  AEROSPHERE 160-9-4.8 MCG/ACT AERO [id number] Yes INHALE 2 PUFFS INTO THE LUNGS IN THE MORNING AND AT BEDTIME Darlean Ozell NOVAK, MD  Active Self, Pharmacy Records  finasteride  (PROSCAR ) 5 MG tablet [id number]  Take 1 tablet (5 mg total) by mouth daily.  Patient not taking: Reported on 05/03/2024   Shona Layman BROCKS, MD  Active Self, Pharmacy Records  morphine  10 MG/5ML solution [id number] Yes Take 1.3 mLs (2.6 mg total) by mouth every 4 (four) hours as needed (Dyspnea). Darci Pore, MD  Active   nitroGLYCERIN  (NITROSTAT ) 0.4 MG SL tablet [id number] Yes Place 1 tablet (0.4 mg total) under the tongue every 5 (five) minutes as needed for chest pain. Janene Boer, PA  Active Self, Pharmacy Records           Med Note CARLEEN, [place] D   Wed Apr 19, 2024  9:04 AM) Has at home - hasn't had to use.  OXYGEN [id number] Yes Inhale 2 L into the lungs at bedtime. [provider]  Active Self, Pharmacy Records  pantoprazole  (PROTONIX ) 40 MG tablet [id number] Yes Take 1 tablet (40 mg total) by mouth daily. Take 30-60 min before first meal of the day Darlean Ozell NOVAK, MD  Active Self, Pharmacy Records  predniSONE  (DELTASONE ) 10 MG tablet [id number] Yes Take 5 tablets (50 mg total) by mouth daily with breakfast for 3 days, THEN 4 tablets (40 mg total) daily with breakfast for 3 days, THEN 3 tablets (30 mg total) daily with breakfast for 3 days, THEN 2 tablets (20 mg total) daily with breakfast for 3 days, THEN 1 tablet (10 mg total) daily with breakfast for 3 days. Darci Pore, MD  Active   ticagrelor  (BRILINTA ) 60 MG TABS tablet [id number] Yes Take 1 tablet (60 mg total) by mouth 2 (two) times daily. Appointment Required For Further Refills [phone] Tolia, Sunit, DO  Active Self, Pharmacy Records            Recommendation:   Continue Current Plan of Care  Follow Up Plan:   Telephone follow-up in 1  week  Andrea Dimes RN, BSN [place]  [facility] Health RN Care Manager [phone]     "

## 2024-05-05 ENCOUNTER — Telehealth (HOSPITAL_BASED_OUTPATIENT_CLINIC_OR_DEPARTMENT_OTHER): Payer: Self-pay

## 2024-05-05 NOTE — Telephone Encounter (Signed)
 CMN received for CPAP supplies sent [facility] signed by provider and faxed confirmation received

## 2024-05-09 ENCOUNTER — Other Ambulatory Visit: Payer: Self-pay | Admitting: *Deleted

## 2024-05-09 NOTE — Transitions of Care (Post Inpatient/ED Visit) (Signed)
 " Transition of Care week 3  Visit Note  05/09/2024  Name: Timothy [name] MRN: [id number]          DOB: May 31, 1955  Situation: Patient enrolled in [facility] 30-day program. Visit completed with Timothy [name] by telephone.   Background:   Initial Transition Care Management Follow-up Telephone Call Discharge Date and Diagnosis: 04/26/24, COPD with acute exacerbation   Past Medical History:  Diagnosis Date   Altered mental status, improved at discharge 06/19/2013   Arthritis    Bladder cancer (HCC) 2018   Bronchitis, chronic (HCC) 06/19/2013   CAD (coronary artery disease)    a. s/p cardiac arrest in 06/2013 with DES to RCA b. 02/2020: Inferior STEMI and cath showing total occlusion of mid-RCA treated with DES placement.    Cancer [facility])    bladder   Cardiac arrest, hypothermic protocol 06/12/2013   COPD exacerbation (HCC) 06/12/2013   GERD (gastroesophageal reflux disease)    Hx of leukocytosis    Hyperlipidemia LDL goal < 70 06/19/2013   Hypokalemia, replaced 06/19/2013   Hypomagnesemia, replaced 06/19/2013   Hypotension, requiring levophed  06/19/2013   NSTEMI (non-ST elevated myocardial infarction), DES to RCA 06/13/2013   Pre-diabetes    Tobacco abuse 06/19/2013    Assessment: Patient Reported Symptoms: Cognitive Cognitive Status: Able to follow simple commands, Alert and oriented to person, place, and time, Normal speech and language skills      Neurological Neurological Review of Symptoms: No symptoms reported    HEENT HEENT Symptoms Reported: Not assessed      Cardiovascular Cardiovascular Symptoms Reported: No symptoms reported    Respiratory Respiratory Symptoms Reported: Shortness of breath Additional Respiratory Details: SOB with exertion, Oxygen now at 1.5L, O2 Sats between 90-96%, Using BiPAP 4-6 hours at night. Patient having difficulty sleeping while using BiPAP. Follow up with Dr. Darlean on 05/11/24 Respiratory Self-Management Outcome: 4 (good)  Endocrine Endocrine  Symptoms Reported: No symptoms reported Is patient diabetic?: No    Gastrointestinal Gastrointestinal Symptoms Reported: No symptoms reported      Genitourinary Genitourinary Symptoms Reported: No symptoms reported    Integumentary Integumentary Symptoms Reported: No symptoms reported    Musculoskeletal Musculoskelatal Symptoms Reviewed: Weakness Additional Musculoskeletal Details: tires out easy Musculoskeletal Management Strategies: Routine screening, Coping strategies, Adequate rest Musculoskeletal Self-Management Outcome: 3 (uncertain) Musculoskeletal Comment: Continues with weekly HH PT Falls in the past year?: No    Psychosocial Psychosocial Symptoms Reported: Not assessed         There were no vitals filed for this visit.    Medications Reviewed Today     Reviewed by Lucky Andrea LABOR, RN (Registered Nurse) on 05/09/24 at 1417  Med List Status: <None>   Medication Order Taking? Sig Documenting Provider Last Dose Status Informant  acetaminophen  (TYLENOL ) 500 MG tablet [id number] Yes Take 500-1,000 mg by mouth every 8 (eight) hours as needed for mild pain or headache.  [provider]  Active Self, Pharmacy Records  albuterol  (VENTOLIN  HFA) 108 [phone] Base) MCG/ACT inhaler [id number] Yes Inhale 2 puffs into the lungs every 6 (six) hours as needed for wheezing or shortness of breath. [name], MD  Active Self, Pharmacy Records  alum & mag hydroxide-simeth (MAALOX/MYLANTA) 200-200-20 MG/5ML suspension [id number] Yes Take 30 mLs by mouth every 6 (six) hours as needed for indigestion or heartburn. Darci Pore, MD  Active   amLODipine  (NORVASC ) 5 MG tablet [id number] Yes Take 1 tablet (5 mg total) by mouth daily. [name], MD  Active Self, Pharmacy  Records  aspirin  EC 81 MG tablet [id number] Yes Take 1 tablet (81 mg total) by mouth daily. Marilyne Stephane DASEN, MD  Active Self, Pharmacy Records           Med Note [place], [place] D   Wed Mar 31, 2017  12:42 AM)    atorvastatin  (LIPITOR ) 80 MG tablet [id number] Yes Take 1 tablet (80 mg total) by mouth daily. [name], MD  Active Self, Pharmacy Records  bisoprolol  (ZEBETA ) 5 MG tablet [id number] Yes Take 1 tablet (5 mg total) by mouth in the morning. Michele Richardson, DO  Active Self, Pharmacy Records  BREZTRI  AEROSPHERE 160-9-4.8 MCG/ACT AERO [id number] Yes INHALE 2 PUFFS INTO THE LUNGS IN THE MORNING AND AT BEDTIME Darlean Ozell NOVAK, MD  Active Self, Pharmacy Records  finasteride  (PROSCAR ) 5 MG tablet [id number]  Take 1 tablet (5 mg total) by mouth daily.  Patient not taking: Reported on 05/09/2024   Shona Layman BROCKS, MD  Active Self, Pharmacy Records  morphine  10 MG/5ML solution [id number] Yes Take 1.3 mLs (2.6 mg total) by mouth every 4 (four) hours as needed (Dyspnea). Darci Pore, MD  Active   nitroGLYCERIN  (NITROSTAT ) 0.4 MG SL tablet [id number] Yes Place 1 tablet (0.4 mg total) under the tongue every 5 (five) minutes as needed for chest pain. Janene Boer, PA  Active Self, Pharmacy Records           Med Note [place], [place] D   Wed Apr 19, 2024  9:04 AM) Has at home - hasn't had to use.  OXYGEN [id number] Yes Inhale 2 L into the lungs at bedtime. [provider]  Active Self, Pharmacy Records  pantoprazole  (PROTONIX ) 40 MG tablet [id number] Yes Take 1 tablet (40 mg total) by mouth daily. Take 30-60 min before first meal of the day Darlean Ozell NOVAK, MD  Active Self, Pharmacy Records  predniSONE  (DELTASONE ) 10 MG tablet [id number] Yes Take 5 tablets (50 mg total) by mouth daily with breakfast for 3 days, THEN 4 tablets (40 mg total) daily with breakfast for 3 days, THEN 3 tablets (30 mg total) daily with breakfast for 3 days, THEN 2 tablets (20 mg total) daily with breakfast for 3 days, THEN 1 tablet (10 mg total) daily with breakfast for 3 days. Darci Pore, MD  Active   ticagrelor  (BRILINTA ) 60 MG TABS tablet [id number] Yes Take 1 tablet (60 mg total) by mouth 2 (two)  times daily. Appointment Required For Further Refills [phone] Michele Richardson, DO  Active Self, Pharmacy Records            Recommendation:   Continue Current Plan of Care  Follow Up Plan:   Closing From:  Transitions of Care Program-per patient request  Andrea Dimes RN, BSN [place]  [facility] RN Care Manager [phone]     "

## 2024-05-09 NOTE — Patient Instructions (Signed)
 Visit Information  Thank you for taking time to visit with me today. Please don't hesitate to contact me if I can be of assistance to you before our next scheduled telephone appointment.   Following is a copy of your care plan:   Goals Addressed             This Visit's Progress    COMPLETED: [facility] Transitions of Care (TOC) Care Plan       Problems:  Recent Hospitalization for treatment of COPD Equipment/DME barrier related to BIPAP and No Hospital Follow Up Provider appointment patient declines to schedule Patient declined to continue with TOC 30-day program  Goal:  Over the next 30 days, the patient will not experience hospital readmission  Interventions:  Transitions of Care: Doctor Visits  - discussed the importance of doctor visits Post discharge activity limitations prescribed by provider reviewed Reviewed Signs and symptoms of infection SDOH assessment Medication review, discussed prednisone  instructions in detail Discussed BIPAP-patient using every night Reviewed [facility] is scheduled for home visit on 05/08/24 Discussed the importance of rest and nutrition [facility] for PT on 05/07/24-will have weekly visits   Patient Self Care Activities:  Attend all scheduled provider appointments Call provider office for new concerns or questions  Notify RN Care Manager of [facility] call rescheduling needs Participate in Transition of Care Program/Attend TOC scheduled calls Take medications as prescribed   limit outdoor activity during cold weather begin a symptom diary keep follow-up appointments: Pulmonology on 05/11/24  Plan:  No further follow up required: Patient declines to continue with [facility] 30-day Program        Patient verbalizes understanding of instructions and care plan provided today and agrees to view in MyChart. Active MyChart status and patient understanding of how to access instructions and care plan via MyChart confirmed with patient.     No further follow up  required: per patient request  Please call the care guide team at [phone] if you need to cancel or reschedule your appointment.   Please call [phone]-TALK (toll free, 24 hour hotline) go to [facility] Urgent [facility] [address] [place] [phone]) call 911 if you are experiencing a Mental Health or Behavioral Health Crisis or need someone to talk to.  Andrea Dimes RN, BSN   [facility] RN Care Manager [phone]

## 2024-05-11 ENCOUNTER — Encounter: Payer: Self-pay | Admitting: Internal Medicine

## 2024-05-11 ENCOUNTER — Ambulatory Visit: Admitting: Internal Medicine

## 2024-05-11 VITALS — BP 116/68 | HR 102 | Temp 97.5°F | Ht 66.0 in | Wt 123.0 lb

## 2024-05-11 DIAGNOSIS — J449 Chronic obstructive pulmonary disease, unspecified: Secondary | ICD-10-CM | POA: Diagnosis not present

## 2024-05-11 DIAGNOSIS — R0902 Hypoxemia: Secondary | ICD-10-CM

## 2024-05-11 DIAGNOSIS — Z87891 Personal history of nicotine dependence: Secondary | ICD-10-CM | POA: Diagnosis not present

## 2024-05-11 MED ORDER — PREDNISONE 10 MG PO TABS: ORAL_TABLET | ORAL | 0 refills | Status: AC

## 2024-05-11 NOTE — Assessment & Plan Note (Addendum)
 MM/quit smoking in 2010  - HFA 75% p coaching 09/01/13 > started on symbicort  160 2 bid  - Spirometry 09/15/13  FEV1 1.67 (49%) ratio 44 % - 05/09/2020   breztri  2bid -  05/09/2020   Walked RA  2 laps @ approx 275ft each @ avg pace  stopped due sob / sats 93%  - alpha one AT phenotype  MM,  Level 182  - Allergy profile 05/09/20 >  Eos 0.6/  IgE  162  - PFT's  07/01/2020  FEV1 1.35 (46 % ) ratio 0.48 p 6 % improvement from saba p breztri  x 2 prior to study with DLCO  9.40 (40%) corrects to 1.97 (45%)  for alv volume and FV curve classic concavity  - 03/04/2023 pseudowheeze on exam/ assoc nasal congestion >> max gerd rx and mucienx D plus 6 d pred - 03/04/2023  After extensive coaching inhaler device,  effectiveness =    80% > continue breztri   - LDSCT  Jun 15 2023  Moderate to marked bullous type emphysema - 07/22/2023 rec ohtuvayre  trial and pulmonary rehab (declined the latter) > 11/09/2023 not able to afford deductible for ohtuvayre  trial   - admitted 04/18/24 p not following ABC action plan and placed on 24 h 02 / bipap but did not tol hs so using 4h / day when not sleeping and seems to help but still on prednisone  at 10 mg per day so  >>> added prn prednisone  as plan d = 20 mg until better then 10 mg x one week and off    Group E in terms of symptoms/risk so  laba/lama/ICS  therefore appropriate rx at this point  >>>  breztri   and approp SABA prn.  >>>  Ok to try albuterol  15 min before an activity (on alternating days)  that you know would usually make you short of breath and see if it makes any difference and if makes none then don't take albuterol  after activity unless you can't catch your breath as this means it's the resting that helps, not the albuterol .

## 2024-05-11 NOTE — Patient Instructions (Addendum)
 Plan A = Automatic = Always=    Breztri  Take 2 puffs first thing in am and then another 2 puffs about 12 hours later.     Plan B = Backup (to supplement plan A, not to replace it) Use your albuterol  inhaler as a rescue medication to be used if you can't catch your breath by resting or slowing your pace  or doing a relaxed purse lip breathing pattern.  - The less you use it, the better it will work when you need it. - Ok to use the inhaler up to 2 puffs  every 4 hours if you must but call for appointment if use goes up over your usual need - Don't leave home without it !!  (think of it like the spare tire or starter fluid for your car)   Plan C = Crisis (instead of Plan B but only if Plan B stops working) - only use your albuterol  nebulizer if you first try Plan B and it fails to help > ok to use the nebulizer up to every 4 hours but if start needing it regularly call for immediate appointment  Also  Ok to try albuterol  15 min before an activity (on alternating days)  that you know would usually make you short of breath and see if it makes any difference and if makes none then don't take albuterol  after activity unless you can't catch your breath as this means it's the resting that helps, not the albuterol .       Plan D = Deltasone   Prednsione 10 mg x 2 daily until back to normal and then 1 daily x 1 week and stop    Plan E = ER - go to ER or call 911 if all else fails    Make sure you check your oxygen saturation  AT  your highest level of activity (not after you stop)   to be sure it stays over 90% and adjust  02 flow upward to maintain this level if needed but remember to turn it back to previous settings when you stop (to conserve your supply).    Please schedule a follow up visit in 3 months but call sooner if needed

## 2024-05-11 NOTE — Progress Notes (Addendum)
 "    Timothy [name], male    DOB: Dec 08, 1955   MRN: [id number]   Brief patient profile:   89 yowm MM/quit smoking 2010 with GOLD 3 criteria 2015 maint on symbicort  160 2bid and losing ground with ex tol so referred to pulmonary clinic 05/09/2020 by Dr   Vicci   Retired journalist, newspaper    History of Present Illness  05/09/2020  Pulmonary/ 1st office eval/[name] symb maint / insurance would not pay spiriva  Chief Complaint  Patient presents with   Pulmonary Consult     Referred by Dr Barnie Vicci. Pt seen here last in 2015 for COPD. He c/o increased SOB over the past 5 years. He states he gets SOB walking up hill and not too far.  He is using his albuterol  inhaler at 4-6 x per wk on average.    Dyspnea:  Grocery shopping limit is [employer] - can't do [employer] anymore, mailbox is 50 ft and no hill steps at slower pace = MMRC3 = can't walk 100 yards even at a slow pace at a flat grade s stopping due to sob   Cough: am congestion/ white  Sleep: on side two pillows flat bed  SABA use: saba hfa  rec Plan A = Automatic = Always=    Stop symbicort  and start Breztri  Take 2 puffs first thing in am and then another 2 puffs about 12 hours later.  Work on inhaler technique:  Plan B = Backup (to supplement plan A, not to replace it) Only use your albuterol  inhaler as a rescue medication Plan C = Crisis (instead of Plan B but only if Plan B stops working) - only use your albuterol  nebulizer if you first try Plan B and it fails to help > ok to use the nebulizer up to every 4 hours but if start needing it regularly call for immediate appointment   - 12/18/2020   patient walked at a slow pace x 3 laps @ 250 ft each  and oxygen dropped to 87 % during 2lap and oxygen was placed and patient come back up to 93% on 2 liters> ordered   08/06/21   >>>  Best fit eval for amb 02 done - did ok on 3lpm pulsed at >90% but rec  4 lpm pulse with higher levels of ex    09/03/2021  f/u ov/[name] re: COPD GOLD 3  maint on breztri   2bid   Chief Complaint  Patient presents with   Follow-up    Breathing is unchanged. He did not bring his o2 today and says he uses this at home sometimes if needed. He is using his albuterol  inhaler about 3 x per wk.   Dyspnea:  does better slow pace, refusing amb 02 to date Mb is 50 ft flat / greenhouse is slt downhill an always out of breath on way back, does not prechallenge with saba as rec  Cough: none  Sleeping: flat one pillow  SABA use: avg  once a day  02:  not really wearing at hs  Covid status:   vax x 3  Rec We will see about you changing companies when your 5 years is up Make sure you check your oxygen saturation  AT  your highest level of activity (NOT after you stop)   to be sure it stays over 90%  Ok to try albuterol  15 min before an activity (on alternating days - like starter fluid)  that you know would usually make you short  of breath Please schedule a follow up visit in 6  months but call sooner if needed      LDSCT  06/15/23 RADS 2  Moderate to marked bullous type emphysema. Lower lobe predominant bronchial wall thickening.  07/22/2023  f/u ov/Evalyse Stroope re: GOLD 3 COPD  maint on breztri  and 02 hs   Chief Complaint  Patient presents with   Follow-up  Dyspnea:  very inactive  Cough: one hour in am cough/ congestion and with nasal congestion clear  Sleeping: flat bed/ 2 pillows SABA use: one-twice a day, sometimes  02: 2lpm HS only  Lung cancer screening : 06/15/23  RADS 2 Rec I will inquire as to a new nebulizer solution to offer you called Ohtuvayre  on a trial basis twice daily - will call you w/in a week  You do not qualify for portable oxygen at this time as lack of oxygen is not the reason your breathing is short       11/09/2023  f/u ov/[name] re: GOLD 3 COPD  maint on breztri  Chief Complaint  Patient presents with   Follow-up    COPD and Respiratory Failure. Uses 2L O2 POC PRN and HS  Dyspnea:  MB  x 100 flat and has to stop / sits down on couch on return   Cough: first hour of the am blowing nose / clearing chest takes up to an hour  Sleeping: fflat bed/ 2 pillows s noct  resp cc  SABA use: maybe up to three times a day  02: 2lpm hs/  no portable 02  Rec Make sure you check your oxygen saturation at your highest level of activity(NOT after you stop)  to be sure it stays over 90%  Admit date:     04/18/2024  Discharge date: 04/26/2024      Discharge Diagnoses: Principal Problem:   COPD with acute exacerbation (HCC) Active Problems:   Mixed hyperlipidemia   Acute on chronic respiratory failure (HCC)   S/P right coronary artery (RCA) stent placement   Acute cystitis without hematuria   History of bladder cancer   Coronary artery disease of native artery of native heart with stable angina pectoris  . *   Hospital Course: [name] is a 69 y.o. male with medical history significant for HTN, cardiac arrest vfib/vtach 06/2013, NSTEMI/CAD with stent to RCA, COPD Gold stage III, chronic hypoxic respiratory failure on 2 L at night and as needed, former smoker, prediabetes, pulmonary nodule, GERD, HLD, Elev PSA (neg prostate bx 07/2022) and bladder cancer who presented via EMS for evaluation of shortness of breath, nausea vomiting and diarrhea.  Patient is admitted to hospitalist service for acute on chronic hypoxic respiratory failure, COPD exacerbation. Hospital course with problems listed.   Assessment and Plan: Acute on chronic hypoxic respiratory failure COPD exacerbation- Patient had worsening shortness of breath, wheezing and productive cough, hypoxia requiring 7 L supplemental oxygen upon presentation. He was placed on bipap 12/18 and 12/19 nights, got ativan  for anxiety. Pulmonology follow up appreciated. Placed in ICS/ LAB/LAMA nebs. And later Breztri  resumed upon discharge. PCCM advised nocturnal PAP device. IV Solu-Medrol  60mg  q daily transitioned to oral prednisone  10mg  every 3 days per PCCM. Continued DuoNebs Q4 hourly,  finished azithromycin . Continue to wean bipap to baseline 2 -3 L. TOC worked on nocturnal PAP device. Morphine  PRN script for anxiety given. Palliative medicine consulted- he wishes outpatient palliative care for ongoing goals of care discussion. PAP, HHPT arranged.     Consultants: PCCM,  palliative Procedures performed: none  Disposition: Home health      05/11/2024  post hosp f/u ov/Gaytha Raybourn re: GOLDS   COPD/ 02 dep maint on  breztri   and finished prednisone  3 d prior to OV   Chief Complaint  Patient presents with   Hospitalization Follow-up    SOB with exertion.  Patient keeping oxygen on 2L with ambulation.   Dyspnea:  doing PT one day prior sats high 80s on 2pm did not turn it up  Cough=  better  Sleeping :flat with  one pillow and 1.5 lpm  and wakes up feeling ok  SABA use: hfa not much  02: 1.5 hs and up 3lpm     No obvious day to day or daytime variability or assoc excess/ purulent sputum or mucus plugs or hemoptysis or cp or chest tightness, subjective wheeze or overt sinus or hb symptoms.    Also denies any obvious fluctuation of symptoms with weather or environmental changes or other aggravating or alleviating factors except as outlined above   No unusual exposure hx or h/o childhood pna/ asthma or knowledge of premature birth.  Current Allergies, Complete Past Medical History, Past Surgical History, Family History, and Social History were reviewed in [employer] record.  ROS  The following are not active complaints unless bolded Hoarseness, sore throat, dysphagia, dental problems, itching, sneezing,  nasal congestion or discharge of excess mucus or purulent secretions, ear ache,   fever, chills, sweats, unintended wt loss or wt gain, classically pleuritic or exertional cp,  orthopnea pnd or arm/hand swelling  or leg swelling, presyncope, palpitations, abdominal pain, anorexia, nausea, vomiting, diarrhea  or change in bowel habits or change in bladder  habits, change in stools or change in urine, dysuria, hematuria,  rash, arthralgias, visual complaints, headache, numbness, weakness or ataxia or problems with walking or coordination,  change in mood or  memory.          Outpatient Medications Prior to Visit  Medication Sig Dispense Refill   acetaminophen  (TYLENOL ) 500 MG tablet Take 500-1,000 mg by mouth every 8 (eight) hours as needed for mild pain or headache.      albuterol  (VENTOLIN  HFA) 108 (90 Base) MCG/ACT inhaler Inhale 2 puffs into the lungs every 6 (six) hours as needed for wheezing or shortness of breath. 8 g 12   alum & mag hydroxide-simeth (MAALOX/MYLANTA) 200-200-20 MG/5ML suspension Take 30 mLs by mouth every 6 (six) hours as needed for indigestion or heartburn. 355 mL 0   amLODipine  (NORVASC ) 5 MG tablet Take 1 tablet (5 mg total) by mouth daily. 90 tablet 2   aspirin  EC 81 MG tablet Take 1 tablet (81 mg total) by mouth daily. 90 tablet 2   atorvastatin  (LIPITOR ) 80 MG tablet Take 1 tablet (80 mg total) by mouth daily. 90 tablet 2   bisoprolol  (ZEBETA ) 5 MG tablet Take 1 tablet (5 mg total) by mouth in the morning. 90 tablet 3   BREZTRI  AEROSPHERE 160-9-4.8 MCG/ACT AERO INHALE 2 PUFFS INTO THE LUNGS IN THE MORNING AND AT BEDTIME 10.7 g 5   morphine  10 MG/5ML solution Take 1.3 mLs (2.6 mg total) by mouth every 4 (four) hours as needed (Dyspnea). 55 mL 0   nitroGLYCERIN  (NITROSTAT ) 0.4 MG SL tablet Place 1 tablet (0.4 mg total) under the tongue every 5 (five) minutes as needed for chest pain. 25 tablet 0   OXYGEN Inhale 2 L into the lungs at bedtime.     pantoprazole  (PROTONIX )  40 MG tablet Take 1 tablet (40 mg total) by mouth daily. Take 30-60 min before first meal of the day 90 tablet 3   ticagrelor  (BRILINTA ) 60 MG TABS tablet Take 1 tablet (60 mg total) by mouth 2 (two) times daily. Appointment Required For Further Refills [phone] 180 tablet 3   finasteride  (PROSCAR ) 5 MG tablet Take 1 tablet (5 mg total) by mouth daily.  (Patient not taking: Reported on 05/09/2024) 90 tablet 3   predniSONE  (DELTASONE ) 10 MG tablet Take 5 tablets (50 mg total) by mouth daily with breakfast for 3 days, THEN 4 tablets (40 mg total) daily with breakfast for 3 days, THEN 3 tablets (30 mg total) daily with breakfast for 3 days, THEN 2 tablets (20 mg total) daily with breakfast for 3 days, THEN 1 tablet (10 mg total) daily with breakfast for 3 days. 45 tablet 0   No facility-administered medications prior to visit.          Past Medical History:  Diagnosis Date   Altered mental status, improved at discharge 06/19/2013   Bronchitis, chronic (HCC) 06/19/2013   CAD (coronary artery disease)    a. s/p cardiac arrest in 06/2013 with DES to RCA b. 02/2020: Inferior STEMI and cath showing total occlusion of mid-RCA treated with DES placement.    [facility])    bladder   Cardiac arrest, hypothermic protocol 06/12/2013   COPD exacerbation (HCC) 06/12/2013   GERD (gastroesophageal reflux disease)    Hyperlipidemia LDL goal < 70 06/19/2013   Hypokalemia, replaced 06/19/2013   Hypomagnesemia, replaced 06/19/2013   Hypotension, requiring levophed  06/19/2013   NSTEMI (non-ST elevated myocardial infarction), DES to RCA 06/13/2013   Pre-diabetes    Tobacco abuse 06/19/2013        Objective:   Wts  05/11/2024         123  11/09/2023         124  07/22/2023       130  05/13/2023         131  03/04/2023     131  03/03/2022     126  09/03/2021         125   06/30/2021       125  12/18/2020       126  07/01/2020       132   06/20/20 133 lb (60.3 kg)  05/09/20 131 lb (59.4 kg)  03/07/20 138 lb (62.6 kg)    Vital signs reviewed  05/11/2024  - Note at rest 02 sats  95% on 2lpm cont    General appearance:    chonically ill thin wm nad   HEENT :  Oropharynx  clear       NECK :  without JVD/Nodes/TM/ nl carotid upstrokes bilaterally   LUNGS: no acc muscle use,  Mod barrel  contour chest wall with bilateral  Distant bs s audible wheeze and  without  cough on insp or exp maneuvers and mod  Hyperresonant  to  percussion bilaterally     CV:  RRR  no s3 or murmur or increase in P2, and no edema   ABD:  soft and nontender with pos mid insp Hoover's  in the supine position. No bruits or organomegaly appreciated, bowel sounds nl  MS:   Ext warm without deformities or   obvious joint restrictions , calf tenderness, cyanosis or clubbing  SKIN: warm and dry without lesions    NEURO:  alert, approp, nl sensorium with  no motor or cerebellar deficits apparent.           Assessment   Assessment & Plan COPD GOLD 3 MM/quit smoking in 2010  - HFA 75% p coaching 09/01/13 > started on symbicort  160 2 bid  - Spirometry 09/15/13  FEV1 1.67 (49%) ratio 44 % - 05/09/2020   breztri  2bid -  05/09/2020   Walked RA  2 laps @ approx 250ft each @ avg pace  stopped due sob / sats 93%  - alpha one AT phenotype  MM,  Level 182  - Allergy profile 05/09/20 >  Eos 0.6/  IgE  162  - PFT's  07/01/2020  FEV1 1.35 (46 % ) ratio 0.48 p 6 % improvement from saba p breztri  x 2 prior to study with DLCO  9.40 (40%) corrects to 1.97 (45%)  for alv volume and FV curve classic concavity  - 03/04/2023 pseudowheeze on exam/ assoc nasal congestion >> max gerd rx and mucienx D plus 6 d pred - 03/04/2023  After extensive coaching inhaler device,  effectiveness =    80% > continue breztri   - LDSCT  Jun 15 2023  Moderate to marked bullous type emphysema - 07/22/2023 rec ohtuvayre  trial and pulmonary rehab (declined the latter) > 11/09/2023 not able to afford deductible for ohtuvayre  trial   - admitted 04/18/24 p not following ABC action plan and placed on 24 h 02 / bipap but did not tol hs so using 4h / day when not sleeping and seems to help but still on prednisone  at 10 mg per day so  >>> added prn prednisone  as plan d = 20 mg until better then 10 mg x one week and off    Group E in terms of symptoms/risk so  laba/lama/ICS  therefore appropriate rx at this point  >>>  breztri   and  approp SABA prn.  >>>  Ok to try albuterol  15 min before an activity (on alternating days)  that you know would usually make you short of breath and see if it makes any difference and if makes none then don't take albuterol  after activity unless you can't catch your breath as this means it's the resting that helps, not the albuterol .       Exercise hypoxemia Reported 07/01/2020  - 12/18/2020   patient walked at a slow pace x 3 laps @ 250 ft each  and oxygen dropped to 87 % during 2lap and oxygen was placed and patient come back up to 93% on 2 liters> ordered .  Best fit eval for amb 02 done 08/06/21  And did ok on 3lpm pulsed at >90% but rec  4 lpm pulse with higher levels of ex > turned down for POC by [facility]  03/04/2023 Patient Saturations on Room Air at Rest = 94%  Patient Saturations on Room Air while Ambulating = 87%  Patient Saturations on 2 Liters of POC oxygen while Ambulating = 93% - ONO on 2lpm 03/16/23  desats x 7 sec only  > no change rx  - 07/22/2023   Walked on RA  x  2  lap(s) =  approx 500  ft  @ nl pace, stopped due to sob with lowest 02 sats 92%    - 05/11/2024  post hosp  could not maintain sats on  5lpm POC walking slow pace at office  >>>   referred to DME for best fit eval for portable 02 but not a candidate for POC at this point  Former cigarette smoker Quit smoking 2010 Low-dose CT lung cancer screening is recommended for patients who are 2-21 years of age with a 20+ pack-year history of smoking and who are currently smoking or quit <=15 years ago. No coughing up blood  No unintentional weight loss of > 15 pounds in the last 6 months -- pt is no longer eligible for LDSCT out > 15 years   Each maintenance medication was reviewed in detail including emphasizing most importantly the difference between maintenance and prns and under what circumstances the prns are to be triggered using an action plan format where appropriate.  Total time for H and P, chart review,  counseling, reviewing hfa/ neb/ 02/ pulse ox  device(s) , directly observing portions of ambulatory 02 saturation study/ and generating customized AVS unique to this office visit / same day charting = post hosp eval for ongoing resp failure/ 02 dep                 AVS  Patient Instructions  Plan A = Automatic = Always=    Breztri  Take 2 puffs first thing in am and then another 2 puffs about 12 hours later.     Plan B = Backup (to supplement plan A, not to replace it) Use your albuterol  inhaler as a rescue medication to be used if you can't catch your breath by resting or slowing your pace  or doing a relaxed purse lip breathing pattern.  - The less you use it, the better it will work when you need it. - Ok to use the inhaler up to 2 puffs  every 4 hours if you must but call for appointment if use goes up over your usual need - Don't leave home without it !!  (think of it like the spare tire or starter fluid for your car)   Plan C = Crisis (instead of Plan B but only if Plan B stops working) - only use your albuterol  nebulizer if you first try Plan B and it fails to help > ok to use the nebulizer up to every 4 hours but if start needing it regularly call for immediate appointment  Also  Ok to try albuterol  15 min before an activity (on alternating days)  that you know would usually make you short of breath and see if it makes any difference and if makes none then don't take albuterol  after activity unless you can't catch your breath as this means it's the resting that helps, not the albuterol .       Plan D = Deltasone   Prednsione 10 mg x 2 daily until back to normal and then 1 daily x 1 week and stop    Plan E = ER - go to ER or call 911 if all else fails    Make sure you check your oxygen saturation  AT  your highest level of activity (not after you stop)   to be sure it stays over 90% and adjust  02 flow upward to maintain this level if needed but remember to turn it back to  previous settings when you stop (to conserve your supply).    Please schedule a follow up visit in 3 months but call sooner if needed    Ozell America, MD 05/11/2024        "

## 2024-05-11 NOTE — Assessment & Plan Note (Addendum)
 Reported 07/01/2020  - 12/18/2020   patient walked at a slow pace x 3 laps @ 250 ft each  and oxygen dropped to 87 % during 2lap and oxygen was placed and patient come back up to 93% on 2 liters> ordered .  Best fit eval for amb 02 done 08/06/21  And did ok on 3lpm pulsed at >90% but rec  4 lpm pulse with higher levels of ex > turned down for POC by [employer]  03/04/2023 Patient Saturations on Room Air at Rest = 94%  Patient Saturations on Room Air while Ambulating = 87%  Patient Saturations on 2 Liters of POC oxygen while Ambulating = 93% - ONO on 2lpm 03/16/23  desats x 7 sec only  > no change rx  - 07/22/2023   Walked on RA  x  2  lap(s) =  approx 500  ft  @ nl pace, stopped due to sob with lowest 02 sats 92%    - 05/11/2024  post hosp  could not maintain sats on  5lpm POC walking slow pace at office  >>>   referred to DME for best fit eval for portable 02 but not a candidate for POC at this point

## 2024-05-11 NOTE — Assessment & Plan Note (Addendum)
 Quit smoking 2010 Low-dose CT lung cancer screening is recommended for patients who are 86-68 years of age with a 20+ pack-year history of smoking and who are currently smoking or quit <=15 years ago. No coughing up blood  No unintentional weight loss of > 15 pounds in the last 6 months -- pt is no longer eligible for LDSCT out > 15 years   Each maintenance medication was reviewed in detail including emphasizing most importantly the difference between maintenance and prns and under what circumstances the prns are to be triggered using an action plan format where appropriate.  Total time for H and P, chart review, counseling, reviewing hfa/ neb/ 02/ pulse ox  device(s) , directly observing portions of ambulatory 02 saturation study/ and generating customized AVS unique to this office visit / same day charting = post hosp eval for ongoing resp failure/ 02 dep

## 2024-05-12 ENCOUNTER — Other Ambulatory Visit: Payer: Self-pay | Admitting: Internal Medicine

## 2024-05-12 ENCOUNTER — Telehealth: Payer: Self-pay

## 2024-05-12 ENCOUNTER — Other Ambulatory Visit: Payer: Self-pay

## 2024-05-12 DIAGNOSIS — J449 Chronic obstructive pulmonary disease, unspecified: Secondary | ICD-10-CM

## 2024-05-12 DIAGNOSIS — J9621 Acute and chronic respiratory failure with hypoxia: Secondary | ICD-10-CM

## 2024-05-12 NOTE — Telephone Encounter (Signed)
 Pt requesting refill of pantoprazole , not mentioned in LOV to continue or d/c. Please advise thank you

## 2024-05-12 NOTE — Telephone Encounter (Signed)
 Copied from CRM #[id number]. Topic: Clinical - Order For Equipment >> May 11, 2024  3:02 PM Dedra B wrote: Reason for CRM: Patient said he needs a prescription for his oxygen sent to [facility]. >> May 12, 2024  9:06 AM Benton KIDD wrote: Patient calling back because [facility] still has not received order for oxygen >> May 12, 2024  8:09 AM Isabell A wrote: Patients girlfriend [name] - listed on DPR demands someone call her back today with an update, states oxygen prescription needs to be sent today in order for patient to receive it today.  Callback number: [phone]  Spoke with patient VBU, order placed   -NFN

## 2024-05-18 NOTE — Telephone Encounter (Signed)
 Orders for home health PT efaxed to [facility]: [phone]

## 2024-06-02 ENCOUNTER — Telehealth: Payer: Self-pay

## 2024-06-02 NOTE — Telephone Encounter (Signed)
 LVM to call and schedule LDCT, also mailed letter to home.

## 2024-06-27 ENCOUNTER — Ambulatory Visit: Admitting: Internal Medicine

## 2024-07-04 ENCOUNTER — Ambulatory Visit: Payer: Medicare Other

## 2024-08-28 ENCOUNTER — Ambulatory Visit: Admitting: Internal Medicine

## 2024-12-28 ENCOUNTER — Other Ambulatory Visit (HOSPITAL_COMMUNITY)
